# Patient Record
Sex: Male | Born: 1945
Health system: Southern US, Community
[De-identification: ages and names within clinical notes are randomized; demographics above are authoritative.]

## PROBLEM LIST (undated history)

## (undated) DIAGNOSIS — K567 Ileus, unspecified: Secondary | ICD-10-CM

## (undated) DIAGNOSIS — M7660 Achilles tendinitis, unspecified leg: Secondary | ICD-10-CM

## (undated) DIAGNOSIS — J45909 Unspecified asthma, uncomplicated: Secondary | ICD-10-CM

## (undated) DIAGNOSIS — K922 Gastrointestinal hemorrhage, unspecified: Secondary | ICD-10-CM

## (undated) DIAGNOSIS — E291 Testicular hypofunction: Secondary | ICD-10-CM

## (undated) DIAGNOSIS — J302 Other seasonal allergic rhinitis: Secondary | ICD-10-CM

## (undated) DIAGNOSIS — M51369 Other intervertebral disc degeneration, lumbar region without mention of lumbar back pain or lower extremity pain: Secondary | ICD-10-CM

## (undated) DIAGNOSIS — G4730 Sleep apnea, unspecified: Secondary | ICD-10-CM

## (undated) DIAGNOSIS — R7303 Prediabetes: Secondary | ICD-10-CM

## (undated) DIAGNOSIS — M5136 Other intervertebral disc degeneration, lumbar region: Secondary | ICD-10-CM

## (undated) DIAGNOSIS — M7550 Bursitis of unspecified shoulder: Secondary | ICD-10-CM

## (undated) DIAGNOSIS — N529 Male erectile dysfunction, unspecified: Secondary | ICD-10-CM

## (undated) DIAGNOSIS — I10 Essential (primary) hypertension: Secondary | ICD-10-CM

## (undated) DIAGNOSIS — M109 Gout, unspecified: Secondary | ICD-10-CM

## (undated) HISTORY — DX: Gastrointestinal hemorrhage, unspecified: K92.2

## (undated) HISTORY — DX: Essential (primary) hypertension: I10

## (undated) HISTORY — PX: TONSILLECTOMY: SUR1361

## (undated) HISTORY — DX: Gout, unspecified: M10.9

## (undated) HISTORY — DX: Testicular hypofunction: E29.1

## (undated) HISTORY — DX: Ileus, unspecified: K56.7

## (undated) HISTORY — PX: EYE SURGERY: SHX253

## (undated) HISTORY — PX: ADENOIDECTOMY: SUR15

## (undated) HISTORY — PX: LUMBAR EPIDURAL INJECTION: SHX1980

## (undated) HISTORY — DX: Unspecified asthma, uncomplicated: J45.909

## (undated) HISTORY — DX: Morbid (severe) obesity due to excess calories: E66.01

## (undated) HISTORY — PX: RHINOPLASTY: SUR1284

---

## 2011-03-10 DIAGNOSIS — I10 Essential (primary) hypertension: Secondary | ICD-10-CM | POA: Insufficient documentation

## 2011-03-10 DIAGNOSIS — E1169 Type 2 diabetes mellitus with other specified complication: Secondary | ICD-10-CM | POA: Insufficient documentation

## 2011-03-10 DIAGNOSIS — R7303 Prediabetes: Secondary | ICD-10-CM | POA: Insufficient documentation

## 2011-04-02 DIAGNOSIS — G8929 Other chronic pain: Secondary | ICD-10-CM | POA: Insufficient documentation

## 2011-04-02 DIAGNOSIS — M1990 Unspecified osteoarthritis, unspecified site: Secondary | ICD-10-CM | POA: Insufficient documentation

## 2011-04-02 DIAGNOSIS — J302 Other seasonal allergic rhinitis: Secondary | ICD-10-CM | POA: Insufficient documentation

## 2011-04-02 DIAGNOSIS — E66811 Obesity, class 1: Secondary | ICD-10-CM | POA: Insufficient documentation

## 2011-04-02 DIAGNOSIS — M1A00X0 Idiopathic chronic gout, unspecified site, without tophus (tophi): Secondary | ICD-10-CM | POA: Insufficient documentation

## 2011-10-20 DIAGNOSIS — M7540 Impingement syndrome of unspecified shoulder: Secondary | ICD-10-CM | POA: Insufficient documentation

## 2012-03-15 DIAGNOSIS — M75122 Complete rotator cuff tear or rupture of left shoulder, not specified as traumatic: Secondary | ICD-10-CM | POA: Insufficient documentation

## 2012-10-25 HISTORY — PX: COLONOSCOPY: SHX174

## 2013-01-19 DIAGNOSIS — M189 Osteoarthritis of first carpometacarpal joint, unspecified: Secondary | ICD-10-CM | POA: Insufficient documentation

## 2013-01-19 DIAGNOSIS — M6530 Trigger finger, unspecified finger: Secondary | ICD-10-CM | POA: Insufficient documentation

## 2013-07-27 HISTORY — PX: CARPAL TUNNEL RELEASE: SHX101

## 2013-07-27 HISTORY — PX: TRIGGER FINGER RELEASE: SHX641

## 2013-08-17 ENCOUNTER — Ambulatory Visit: Payer: Self-pay | Admitting: Family Medicine

## 2013-10-02 ENCOUNTER — Encounter: Payer: Self-pay | Admitting: Rheumatology

## 2013-10-25 ENCOUNTER — Encounter: Payer: Self-pay | Admitting: Rheumatology

## 2014-03-14 ENCOUNTER — Encounter: Payer: Self-pay | Admitting: Orthopedic Surgery

## 2014-03-27 ENCOUNTER — Encounter: Payer: Self-pay | Admitting: Orthopedic Surgery

## 2014-04-26 ENCOUNTER — Encounter: Payer: Self-pay | Admitting: Orthopedic Surgery

## 2014-07-30 DIAGNOSIS — M5414 Radiculopathy, thoracic region: Secondary | ICD-10-CM | POA: Diagnosis not present

## 2014-07-30 DIAGNOSIS — M9902 Segmental and somatic dysfunction of thoracic region: Secondary | ICD-10-CM | POA: Diagnosis not present

## 2014-07-31 DIAGNOSIS — M9902 Segmental and somatic dysfunction of thoracic region: Secondary | ICD-10-CM | POA: Diagnosis not present

## 2014-07-31 DIAGNOSIS — M5414 Radiculopathy, thoracic region: Secondary | ICD-10-CM | POA: Diagnosis not present

## 2014-08-02 DIAGNOSIS — M5414 Radiculopathy, thoracic region: Secondary | ICD-10-CM | POA: Diagnosis not present

## 2014-08-02 DIAGNOSIS — M9902 Segmental and somatic dysfunction of thoracic region: Secondary | ICD-10-CM | POA: Diagnosis not present

## 2014-08-06 DIAGNOSIS — M9902 Segmental and somatic dysfunction of thoracic region: Secondary | ICD-10-CM | POA: Diagnosis not present

## 2014-08-06 DIAGNOSIS — M5414 Radiculopathy, thoracic region: Secondary | ICD-10-CM | POA: Diagnosis not present

## 2014-08-07 DIAGNOSIS — M9902 Segmental and somatic dysfunction of thoracic region: Secondary | ICD-10-CM | POA: Diagnosis not present

## 2014-08-07 DIAGNOSIS — M5414 Radiculopathy, thoracic region: Secondary | ICD-10-CM | POA: Diagnosis not present

## 2014-08-09 DIAGNOSIS — M9902 Segmental and somatic dysfunction of thoracic region: Secondary | ICD-10-CM | POA: Diagnosis not present

## 2014-08-09 DIAGNOSIS — M5414 Radiculopathy, thoracic region: Secondary | ICD-10-CM | POA: Diagnosis not present

## 2014-08-16 DIAGNOSIS — M5414 Radiculopathy, thoracic region: Secondary | ICD-10-CM | POA: Diagnosis not present

## 2014-08-16 DIAGNOSIS — M9902 Segmental and somatic dysfunction of thoracic region: Secondary | ICD-10-CM | POA: Diagnosis not present

## 2014-08-23 DIAGNOSIS — Z23 Encounter for immunization: Secondary | ICD-10-CM | POA: Diagnosis not present

## 2014-08-23 DIAGNOSIS — I10 Essential (primary) hypertension: Secondary | ICD-10-CM | POA: Diagnosis not present

## 2014-08-23 DIAGNOSIS — Z125 Encounter for screening for malignant neoplasm of prostate: Secondary | ICD-10-CM | POA: Diagnosis not present

## 2014-08-23 DIAGNOSIS — E785 Hyperlipidemia, unspecified: Secondary | ICD-10-CM | POA: Diagnosis not present

## 2014-08-23 DIAGNOSIS — R52 Pain, unspecified: Secondary | ICD-10-CM | POA: Diagnosis not present

## 2014-08-23 DIAGNOSIS — E119 Type 2 diabetes mellitus without complications: Secondary | ICD-10-CM | POA: Diagnosis not present

## 2014-08-23 DIAGNOSIS — Z0000 Encounter for general adult medical examination without abnormal findings: Secondary | ICD-10-CM | POA: Diagnosis not present

## 2014-08-23 DIAGNOSIS — E291 Testicular hypofunction: Secondary | ICD-10-CM | POA: Diagnosis not present

## 2014-08-23 DIAGNOSIS — M109 Gout, unspecified: Secondary | ICD-10-CM | POA: Diagnosis not present

## 2014-08-30 DIAGNOSIS — M9902 Segmental and somatic dysfunction of thoracic region: Secondary | ICD-10-CM | POA: Diagnosis not present

## 2014-08-30 DIAGNOSIS — M5414 Radiculopathy, thoracic region: Secondary | ICD-10-CM | POA: Diagnosis not present

## 2014-10-03 DIAGNOSIS — M5413 Radiculopathy, cervicothoracic region: Secondary | ICD-10-CM | POA: Diagnosis not present

## 2014-10-03 DIAGNOSIS — M5414 Radiculopathy, thoracic region: Secondary | ICD-10-CM | POA: Diagnosis not present

## 2014-10-03 DIAGNOSIS — M9904 Segmental and somatic dysfunction of sacral region: Secondary | ICD-10-CM | POA: Diagnosis not present

## 2014-10-03 DIAGNOSIS — M9902 Segmental and somatic dysfunction of thoracic region: Secondary | ICD-10-CM | POA: Diagnosis not present

## 2014-10-03 DIAGNOSIS — M9901 Segmental and somatic dysfunction of cervical region: Secondary | ICD-10-CM | POA: Diagnosis not present

## 2014-11-19 DIAGNOSIS — E119 Type 2 diabetes mellitus without complications: Secondary | ICD-10-CM | POA: Diagnosis not present

## 2014-11-19 DIAGNOSIS — I10 Essential (primary) hypertension: Secondary | ICD-10-CM | POA: Diagnosis not present

## 2014-11-19 DIAGNOSIS — E785 Hyperlipidemia, unspecified: Secondary | ICD-10-CM | POA: Diagnosis not present

## 2014-11-19 DIAGNOSIS — R748 Abnormal levels of other serum enzymes: Secondary | ICD-10-CM | POA: Diagnosis not present

## 2014-12-10 DIAGNOSIS — M9902 Segmental and somatic dysfunction of thoracic region: Secondary | ICD-10-CM | POA: Diagnosis not present

## 2014-12-10 DIAGNOSIS — M5414 Radiculopathy, thoracic region: Secondary | ICD-10-CM | POA: Diagnosis not present

## 2014-12-11 DIAGNOSIS — M5414 Radiculopathy, thoracic region: Secondary | ICD-10-CM | POA: Diagnosis not present

## 2014-12-11 DIAGNOSIS — M9902 Segmental and somatic dysfunction of thoracic region: Secondary | ICD-10-CM | POA: Diagnosis not present

## 2015-02-11 DIAGNOSIS — M545 Low back pain: Secondary | ICD-10-CM | POA: Diagnosis not present

## 2015-02-11 DIAGNOSIS — M9902 Segmental and somatic dysfunction of thoracic region: Secondary | ICD-10-CM | POA: Diagnosis not present

## 2015-02-11 DIAGNOSIS — M9903 Segmental and somatic dysfunction of lumbar region: Secondary | ICD-10-CM | POA: Diagnosis not present

## 2015-02-11 DIAGNOSIS — M5414 Radiculopathy, thoracic region: Secondary | ICD-10-CM | POA: Diagnosis not present

## 2015-03-14 DIAGNOSIS — H2513 Age-related nuclear cataract, bilateral: Secondary | ICD-10-CM | POA: Diagnosis not present

## 2015-03-14 DIAGNOSIS — E119 Type 2 diabetes mellitus without complications: Secondary | ICD-10-CM | POA: Diagnosis not present

## 2015-03-14 DIAGNOSIS — H43393 Other vitreous opacities, bilateral: Secondary | ICD-10-CM | POA: Diagnosis not present

## 2015-03-14 DIAGNOSIS — H31091 Other chorioretinal scars, right eye: Secondary | ICD-10-CM | POA: Diagnosis not present

## 2015-03-25 DIAGNOSIS — N138 Other obstructive and reflux uropathy: Secondary | ICD-10-CM | POA: Insufficient documentation

## 2015-03-25 DIAGNOSIS — I10 Essential (primary) hypertension: Secondary | ICD-10-CM | POA: Diagnosis not present

## 2015-03-25 DIAGNOSIS — Z87891 Personal history of nicotine dependence: Secondary | ICD-10-CM | POA: Diagnosis not present

## 2015-03-25 DIAGNOSIS — M19042 Primary osteoarthritis, left hand: Secondary | ICD-10-CM | POA: Diagnosis not present

## 2015-03-25 DIAGNOSIS — M545 Low back pain: Secondary | ICD-10-CM | POA: Diagnosis not present

## 2015-03-25 DIAGNOSIS — Z885 Allergy status to narcotic agent status: Secondary | ICD-10-CM | POA: Diagnosis not present

## 2015-03-25 DIAGNOSIS — N529 Male erectile dysfunction, unspecified: Secondary | ICD-10-CM | POA: Diagnosis not present

## 2015-03-25 DIAGNOSIS — E291 Testicular hypofunction: Secondary | ICD-10-CM | POA: Diagnosis not present

## 2015-03-25 DIAGNOSIS — Z79899 Other long term (current) drug therapy: Secondary | ICD-10-CM | POA: Diagnosis not present

## 2015-03-25 DIAGNOSIS — N401 Enlarged prostate with lower urinary tract symptoms: Secondary | ICD-10-CM | POA: Diagnosis not present

## 2015-03-25 DIAGNOSIS — M19041 Primary osteoarthritis, right hand: Secondary | ICD-10-CM | POA: Diagnosis not present

## 2015-03-25 DIAGNOSIS — E119 Type 2 diabetes mellitus without complications: Secondary | ICD-10-CM | POA: Diagnosis not present

## 2015-04-16 ENCOUNTER — Other Ambulatory Visit: Payer: Self-pay | Admitting: Family Medicine

## 2015-04-17 NOTE — Telephone Encounter (Signed)
Your patient 

## 2015-04-29 DIAGNOSIS — M461 Sacroiliitis, not elsewhere classified: Secondary | ICD-10-CM | POA: Diagnosis not present

## 2015-04-29 DIAGNOSIS — M9904 Segmental and somatic dysfunction of sacral region: Secondary | ICD-10-CM | POA: Diagnosis not present

## 2015-04-30 DIAGNOSIS — M461 Sacroiliitis, not elsewhere classified: Secondary | ICD-10-CM | POA: Diagnosis not present

## 2015-04-30 DIAGNOSIS — M9904 Segmental and somatic dysfunction of sacral region: Secondary | ICD-10-CM | POA: Diagnosis not present

## 2015-05-09 DIAGNOSIS — M9904 Segmental and somatic dysfunction of sacral region: Secondary | ICD-10-CM | POA: Diagnosis not present

## 2015-05-09 DIAGNOSIS — M461 Sacroiliitis, not elsewhere classified: Secondary | ICD-10-CM | POA: Diagnosis not present

## 2015-05-13 DIAGNOSIS — M461 Sacroiliitis, not elsewhere classified: Secondary | ICD-10-CM | POA: Diagnosis not present

## 2015-05-13 DIAGNOSIS — M9904 Segmental and somatic dysfunction of sacral region: Secondary | ICD-10-CM | POA: Diagnosis not present

## 2015-05-16 DIAGNOSIS — M461 Sacroiliitis, not elsewhere classified: Secondary | ICD-10-CM | POA: Diagnosis not present

## 2015-05-16 DIAGNOSIS — M9904 Segmental and somatic dysfunction of sacral region: Secondary | ICD-10-CM | POA: Diagnosis not present

## 2015-05-29 DIAGNOSIS — M545 Low back pain: Secondary | ICD-10-CM | POA: Diagnosis not present

## 2015-05-29 DIAGNOSIS — Q789 Osteochondrodysplasia, unspecified: Secondary | ICD-10-CM | POA: Diagnosis not present

## 2015-05-29 DIAGNOSIS — M5137 Other intervertebral disc degeneration, lumbosacral region: Secondary | ICD-10-CM | POA: Diagnosis not present

## 2015-05-29 DIAGNOSIS — M533 Sacrococcygeal disorders, not elsewhere classified: Secondary | ICD-10-CM | POA: Diagnosis not present

## 2015-05-29 DIAGNOSIS — M488X6 Other specified spondylopathies, lumbar region: Secondary | ICD-10-CM | POA: Diagnosis not present

## 2015-06-06 DIAGNOSIS — M461 Sacroiliitis, not elsewhere classified: Secondary | ICD-10-CM | POA: Diagnosis not present

## 2015-06-06 DIAGNOSIS — M9904 Segmental and somatic dysfunction of sacral region: Secondary | ICD-10-CM | POA: Diagnosis not present

## 2015-07-15 DIAGNOSIS — M5136 Other intervertebral disc degeneration, lumbar region: Secondary | ICD-10-CM | POA: Diagnosis not present

## 2015-07-15 DIAGNOSIS — M461 Sacroiliitis, not elsewhere classified: Secondary | ICD-10-CM | POA: Diagnosis not present

## 2015-07-15 DIAGNOSIS — M4306 Spondylolysis, lumbar region: Secondary | ICD-10-CM | POA: Diagnosis not present

## 2015-07-15 DIAGNOSIS — M5416 Radiculopathy, lumbar region: Secondary | ICD-10-CM | POA: Diagnosis not present

## 2015-07-18 DIAGNOSIS — M461 Sacroiliitis, not elsewhere classified: Secondary | ICD-10-CM | POA: Diagnosis not present

## 2015-07-18 DIAGNOSIS — M545 Low back pain: Secondary | ICD-10-CM | POA: Diagnosis not present

## 2015-07-18 DIAGNOSIS — M9904 Segmental and somatic dysfunction of sacral region: Secondary | ICD-10-CM | POA: Diagnosis not present

## 2015-07-24 DIAGNOSIS — M461 Sacroiliitis, not elsewhere classified: Secondary | ICD-10-CM | POA: Diagnosis not present

## 2015-08-12 ENCOUNTER — Telehealth: Payer: Self-pay | Admitting: Family Medicine

## 2015-08-12 NOTE — Telephone Encounter (Signed)
Pharmacy changed in chart. Routing to provider.

## 2015-08-12 NOTE — Telephone Encounter (Signed)
Pt called stated all RX's need to be sent to [facility] in [place] due to insurance purposes. Please remove [facility] from pharmacy list. Thanks.

## 2015-08-12 NOTE — Telephone Encounter (Signed)
Pt needs refills on all medications. Pharm is [facility] in [place]. Thanks.

## 2015-08-13 ENCOUNTER — Other Ambulatory Visit: Payer: Self-pay | Admitting: Physical Medicine and Rehabilitation

## 2015-08-13 DIAGNOSIS — M5417 Radiculopathy, lumbosacral region: Secondary | ICD-10-CM

## 2015-08-14 ENCOUNTER — Other Ambulatory Visit: Payer: Self-pay | Admitting: Family Medicine

## 2015-08-14 MED ORDER — AMLODIPINE BESYLATE 10 MG PO TABS: 10.0000 mg | ORAL_TABLET | Freq: Every day | ORAL | Status: DC

## 2015-08-14 MED ORDER — LISINOPRIL-HYDROCHLOROTHIAZIDE 20-12.5 MG PO TABS: 1.0000 | ORAL_TABLET | Freq: Every day | ORAL | Status: DC

## 2015-08-14 MED ORDER — DICLOFENAC SODIUM 75 MG PO TBEC: 75.0000 mg | DELAYED_RELEASE_TABLET | Freq: Two times a day (BID) | ORAL | Status: DC | PRN

## 2015-08-14 MED ORDER — ALLOPURINOL 100 MG PO TABS: 100.0000 mg | ORAL_TABLET | Freq: Every day | ORAL | Status: DC

## 2015-08-14 MED ORDER — LEVOCETIRIZINE DIHYDROCHLORIDE 5 MG PO TABS: 5.0000 mg | ORAL_TABLET | Freq: Every evening | ORAL | Status: DC

## 2015-08-14 MED ORDER — FLUTICASONE PROPIONATE 50 MCG/ACT NA SUSP: 2.0000 | Freq: Every day | NASAL | Status: DC

## 2015-08-14 NOTE — Telephone Encounter (Signed)
He has not been seen here since we switched to Epic December 26, 2014 PP reviewed; last regular visit with Dr. Jeananne Rama was April 2016; last routine labs were January 2016 He does not have an upcoming appointment with Dr. Jeananne Rama for follow-up Please ask him to make an appointment and then back to me for refill consideration

## 2015-08-14 NOTE — Telephone Encounter (Signed)
Appt w Dr Jeananne Rama 09/05/15 @ 2:30.

## 2015-08-15 ENCOUNTER — Telehealth: Payer: Self-pay | Admitting: Family Medicine

## 2015-08-15 NOTE — Telephone Encounter (Signed)
I found this Rx had been prescribed in PP, old chart Rx approved

## 2015-08-15 NOTE — Telephone Encounter (Signed)
Pt called stated that medications were called in to [facility] and the quantities were incorrect he would like this corrected ASAP.  Pt stated all medications should have been sent for 90 day supplies not a 30 day supply.  Pt stated the RX for Metaxalone was not sent at all.  [facility] in [place]. Thanks.

## 2015-08-16 ENCOUNTER — Other Ambulatory Visit: Payer: Self-pay | Admitting: Family Medicine

## 2015-08-16 NOTE — Telephone Encounter (Signed)
Spoke with patient, he is very mad that a 90 day supply was not sent in, he states that he might just have to change offices. He states that there has been no cooperation here with [place] and the way we are doing things now make no sense.

## 2015-08-16 NOTE — Telephone Encounter (Signed)
I sent 30 days on purpose; see last phone note The metaxalone came in as a separate request and I sent it when I received it I left detailed msg; explained I approved 30 days; he'll most likely get labs and I'm sure Dr. Jeananne Rama will approve 90 days when he sees him in Feb

## 2015-08-16 NOTE — Telephone Encounter (Signed)
Routing to provider  

## 2015-08-16 NOTE — Telephone Encounter (Signed)
I approved 30 days; needs appt and labs; I am not comfortable approving a 3 month supply; he has appt with Dr. Jeananne Rama in a few weeks

## 2015-08-22 DIAGNOSIS — M5416 Radiculopathy, lumbar region: Secondary | ICD-10-CM | POA: Diagnosis not present

## 2015-08-22 DIAGNOSIS — M5136 Other intervertebral disc degeneration, lumbar region: Secondary | ICD-10-CM | POA: Diagnosis not present

## 2015-09-02 ENCOUNTER — Ambulatory Visit
Admission: RE | Admit: 2015-09-02 | Discharge: 2015-09-02 | Disposition: A | Discharge status: Home / Self Care | Payer: PPO | Source: Ambulatory Visit | Attending: Physical Medicine and Rehabilitation | Admitting: Physical Medicine and Rehabilitation

## 2015-09-02 DIAGNOSIS — M4690 Unspecified inflammatory spondylopathy, site unspecified: Secondary | ICD-10-CM | POA: Diagnosis not present

## 2015-09-02 DIAGNOSIS — M5186 Other intervertebral disc disorders, lumbar region: Secondary | ICD-10-CM | POA: Diagnosis not present

## 2015-09-02 DIAGNOSIS — M5417 Radiculopathy, lumbosacral region: Secondary | ICD-10-CM

## 2015-09-02 DIAGNOSIS — M5126 Other intervertebral disc displacement, lumbar region: Secondary | ICD-10-CM | POA: Diagnosis not present

## 2015-09-02 DIAGNOSIS — M4806 Spinal stenosis, lumbar region: Secondary | ICD-10-CM | POA: Diagnosis not present

## 2015-09-02 DIAGNOSIS — M5416 Radiculopathy, lumbar region: Secondary | ICD-10-CM | POA: Diagnosis not present

## 2015-09-05 ENCOUNTER — Ambulatory Visit: Payer: Self-pay | Admitting: Family Medicine

## 2015-09-17 DIAGNOSIS — M1A0720 Idiopathic chronic gout, left ankle and foot, without tophus (tophi): Secondary | ICD-10-CM | POA: Diagnosis not present

## 2015-09-17 DIAGNOSIS — E785 Hyperlipidemia, unspecified: Secondary | ICD-10-CM | POA: Diagnosis not present

## 2015-09-17 DIAGNOSIS — R7303 Prediabetes: Secondary | ICD-10-CM | POA: Diagnosis not present

## 2015-09-17 DIAGNOSIS — E1169 Type 2 diabetes mellitus with other specified complication: Secondary | ICD-10-CM | POA: Insufficient documentation

## 2015-09-17 DIAGNOSIS — E291 Testicular hypofunction: Secondary | ICD-10-CM | POA: Diagnosis not present

## 2015-09-17 DIAGNOSIS — M7660 Achilles tendinitis, unspecified leg: Secondary | ICD-10-CM | POA: Diagnosis not present

## 2015-09-17 DIAGNOSIS — N529 Male erectile dysfunction, unspecified: Secondary | ICD-10-CM | POA: Diagnosis not present

## 2015-09-17 DIAGNOSIS — I10 Essential (primary) hypertension: Secondary | ICD-10-CM | POA: Diagnosis not present

## 2015-09-18 DIAGNOSIS — E785 Hyperlipidemia, unspecified: Secondary | ICD-10-CM | POA: Diagnosis not present

## 2015-09-18 DIAGNOSIS — M1A0720 Idiopathic chronic gout, left ankle and foot, without tophus (tophi): Secondary | ICD-10-CM | POA: Diagnosis not present

## 2015-09-18 DIAGNOSIS — R7303 Prediabetes: Secondary | ICD-10-CM | POA: Diagnosis not present

## 2015-09-18 DIAGNOSIS — I10 Essential (primary) hypertension: Secondary | ICD-10-CM | POA: Diagnosis not present

## 2015-09-18 DIAGNOSIS — M7551 Bursitis of right shoulder: Secondary | ICD-10-CM | POA: Diagnosis not present

## 2015-09-24 DIAGNOSIS — G8929 Other chronic pain: Secondary | ICD-10-CM | POA: Diagnosis not present

## 2015-09-24 DIAGNOSIS — M25512 Pain in left shoulder: Secondary | ICD-10-CM | POA: Diagnosis not present

## 2015-09-27 ENCOUNTER — Ambulatory Visit: Payer: Self-pay | Admitting: Family Medicine

## 2015-10-04 ENCOUNTER — Other Ambulatory Visit: Payer: Self-pay

## 2015-10-04 ENCOUNTER — Encounter: Payer: Self-pay | Admitting: Family Medicine

## 2015-10-04 ENCOUNTER — Ambulatory Visit (INDEPENDENT_AMBULATORY_CARE_PROVIDER_SITE_OTHER): Payer: PPO | Admitting: Family Medicine

## 2015-10-04 VITALS — BP 128/84 | HR 77 | Ht 71.0 in | Wt 283.0 lb

## 2015-10-04 DIAGNOSIS — S86002A Unspecified injury of left Achilles tendon, initial encounter: Secondary | ICD-10-CM | POA: Diagnosis not present

## 2015-10-04 DIAGNOSIS — M7662 Achilles tendinitis, left leg: Secondary | ICD-10-CM | POA: Diagnosis not present

## 2015-10-04 NOTE — Progress Notes (Signed)
[facility] [address] [place], [place] [address] Phone: [phone] Subjective:    I'm seeing this patient by the request  of:  [name]   CC: Left Achilles pain  [id number] [facility] is a 70 y.o. male coming in with complaint of left Achilles pain. Patient states his been going on many weeks if not months. Does not remember any true injury. Patient states that it is very tender to palpation. Has not notice any significant improvement with any of the changes in shoes. Patient has tried icing 1 time which was helpful. Seems to fluctuate in size. Denies any fevers or chills. No breakdown of the skin he states. Rates the severity of pain though is 8 out of 10. Patient recently did have a back injury and has not been working out secondary to that as well as now since he's had this pain in his ankle. Patient has gained 40 pounds he states over the last couple months.     Past Medical History  Diagnosis Date  . Male hypogonadism   . Hypertension   . Gout   . Chronic back pain     L4 deformity   Past Surgical History  Procedure Laterality Date  . Trigger finger release Right   . Carpal tunnel release Right 2015  . Tonsillectomy    . Rhinoplasty     Social History   Social History  . Marital Status: Married    Spouse Name: N/A  . Number of Children: N/A  . Years of Education: N/A   Social History Main Topics  . Smoking status: Former Smoker    Quit date: 07/27/1978  . Smokeless tobacco: None  . Alcohol Use: Yes  . Drug Use: No  . Sexual Activity: Not Asked   Other Topics Concern  . None   Social History Narrative   Allergies  Allergen Reactions  . Codeine Other (See Comments)    Other reaction(s): Other (See Comments) HEADACHES Causes headaches when he stops liquid Codeine-based products   Family History  Problem Relation Age of Onset  . Alcohol abuse Father   . Hypertension Father   . CVA Father 44    Past  medical history, social, surgical and family history all reviewed in electronic medical record.  No pertanent information unless stated regarding to the chief complaint.   Review of Systems: No headache, visual changes, nausea, vomiting, diarrhea, constipation, dizziness, abdominal pain, skin rash, fevers, chills, night sweats, weight loss, swollen lymph nodes, body aches, joint swelling, muscle aches, chest pain, shortness of breath, mood changes.   Objective Blood pressure 128/84, pulse 77, height 5\' 11"  (1.803 m), weight 283 lb (128.368 kg), SpO2 97 %.  General: No apparent distress alert and oriented x3 mood and affect normal, dressed appropriately. Severe obesity HEENT: Pupils equal, extraocular movements intact  Respiratory: Patient's speak in full sentences and does not appear short of breath  Cardiovascular: No lower extremity edema, non tender, no erythema  Skin: Warm dry intact with no signs of infection or rash on extremities or on axial skeleton.  Abdomen: Soft nontender  Neuro: Cranial nerves II through XII are intact, neurovascularly intact in all extremities with 2+ DTRs and 2+ pulses.  Lymph: No lymphadenopathy of posterior or anterior cervical chain or axillae bilaterally.  Gait antalgic gait MSK:  Non tender with full range of motion and good stability and symmetric strength and tone of shoulders, elbows, wrist, hip, knee and bilaterally.  Ankle: Left  Patient does have an Achilles nodule approximately 3 cm proximal to insertion. Range of motion is full in all directions. Strength is 5/5 in all directions. Stable lateral and medial ligaments; squeeze test and kleiger test unremarkable; Talar dome nontender; No pain at base of 5th MT; No tenderness over cuboid; No tenderness over N spot or navicular prominence No tenderness on posterior aspects of lateral and medial malleolus No sign of peroneal tendon subluxations or tenderness to palpation Negative tarsal tunnel  tinel's Achilles is significant tender to palpation over the nodule. No significant pain at the insertion. Negative Thompson test. Neurovascular intact distally. Able to walk 4 steps. Contralateral ankle unremarkable  MSK US performed of: Left ankle This study was ordered, performed, and interpreted by Charlann Boxer D.O.  Foot/Ankle:   All structures visualized and important images stored. Talar dome unremarkable  Ankle mortise without effusion. Achilles tendon visualized along length of tendon and insertion has very minimal hypoechoic changes. Approximately 3 cm proximal to this area patient does have a large thickening of the Achilles tendon as well as inflammation and hypoechoic changes. Severe increase in Doppler flow within the tendon itself. Anterior Talofibular Ligament and Calcaneofibular Ligaments unremarkable and intact. Deltoid Ligament unremarkable and intact. Plantar fascia intact and without effusion, normal thickness. No increased doppler signal, cap sign, or thickening of tibial cortex. Power doppler signal normal. Images stored in machine hard drive  IMPRESSION:  Severe Achilles tendinitis with increasing Doppler flow within the tendon that corresponds with partial tearing.       Impression and Recommendations:     This case required medical decision making of moderate complexity.      Note: This dictation was prepared with Dragon dictation along with smaller phrase technology. Any transcriptional errors that result from this process are unintentional.

## 2015-10-04 NOTE — Patient Instructions (Signed)
Good to see you.  Ice 20 minutes 2 times daily. Usually after activity and before bed. pennsaid pinkie amount topically 2 times daily as needed.  Vitamin D 2000 IU daily over the counter can help with healing.  Heel lift in left shoe at all times Wear brace with walking.  Avoid high impact exercises  See me again in 2-3 weeks and if better we will get you doing some exercises.  OK to bike for now.

## 2015-10-04 NOTE — Assessment & Plan Note (Signed)
Patient does have what seems to be severe Achilles tendinitis with partial tearing noted. We discussed icing regimen, home exercises, patient was given a brace. Patient will do a heel lift. We discussed which activities to avoid. We did discuss the possibility of a Cam Walker but secondary to patient's most recent back injury I think that the asymmetry of walking could cause exacerbation of the back. Patient and will come back in 2-3 weeks. As long as we seen decrease inflammation as well as the Doppler flow on ultrasound we will start him with home exercises and questionable formal physical therapy.

## 2015-10-04 NOTE — Progress Notes (Signed)
Pre visit review using our clinic review tool, if applicable. No additional management support is needed unless otherwise documented below in the visit note. 

## 2015-10-07 ENCOUNTER — Ambulatory Visit: Payer: Self-pay | Admitting: Family Medicine

## 2015-10-16 DIAGNOSIS — M5416 Radiculopathy, lumbar region: Secondary | ICD-10-CM | POA: Diagnosis not present

## 2015-10-24 ENCOUNTER — Encounter: Payer: Self-pay | Admitting: Family Medicine

## 2015-10-24 ENCOUNTER — Ambulatory Visit (INDEPENDENT_AMBULATORY_CARE_PROVIDER_SITE_OTHER): Payer: PPO | Admitting: Family Medicine

## 2015-10-24 VITALS — BP 128/76 | HR 74 | Wt 283.0 lb

## 2015-10-24 DIAGNOSIS — M7662 Achilles tendinitis, left leg: Secondary | ICD-10-CM | POA: Diagnosis not present

## 2015-10-24 DIAGNOSIS — M1A00X0 Idiopathic chronic gout, unspecified site, without tophus (tophi): Secondary | ICD-10-CM

## 2015-10-24 MED ORDER — PREDNISONE 50 MG PO TABS: 50.0000 mg | ORAL_TABLET | Freq: Every day | ORAL | Status: DC

## 2015-10-24 MED ORDER — ALLOPURINOL 300 MG PO TABS: 300.0000 mg | ORAL_TABLET | Freq: Every day | ORAL | Status: DC

## 2015-10-24 NOTE — Assessment & Plan Note (Signed)
Encourage weight loss. 

## 2015-10-24 NOTE — Progress Notes (Signed)
[facility] [address], [place] [address] Phone: [phone] Subjective:    I'm seeing this patient by the request  of:  [name]   CC: Left Achilles pain f/u  [id number] [facility] is a 70 y.o. male coming in with complaint of left Achilles pain. Found to have partial tearing at this time. Patient was to wear a heel lift which she never got. Has been wearing a brace with no significant improvement. Occasionally tries a topical and oral anti-inflammatories with no improvement. Doing the exercises sometimes which does help but doesn't do it regular enough. Patient continues to have a significant amount soreness that does keep him from doing things. Sometimes even the tightness at night. Recently has had more trouble with his gout.     Past Medical History  Diagnosis Date  . Male hypogonadism   . Hypertension   . Gout   . Chronic back pain     L4 deformity   Past Surgical History  Procedure Laterality Date  . Trigger finger release Right   . Carpal tunnel release Right 2015  . Tonsillectomy    . Rhinoplasty     Social History   Social History  . Marital Status: Married    Spouse Name: N/A  . Number of Children: N/A  . Years of Education: N/A   Social History Main Topics  . Smoking status: Former Smoker    Quit date: 07/27/1978  . Smokeless tobacco: Not on file  . Alcohol Use: Yes  . Drug Use: No  . Sexual Activity: Not on file   Other Topics Concern  . Not on file   Social History Narrative   Allergies  Allergen Reactions  . Codeine Other (See Comments)    Other reaction(s): Other (See Comments) HEADACHES Causes headaches when he stops liquid Codeine-based products   Family History  Problem Relation Age of Onset  . Alcohol abuse Father   . Hypertension Father   . CVA Father 32    Past medical history, social, surgical and family history all reviewed in electronic medical record.  No pertanent  information unless stated regarding to the chief complaint.   Review of Systems: No headache, visual changes, nausea, vomiting, diarrhea, constipation, dizziness, abdominal pain, skin rash, fevers, chills, night sweats, weight loss, swollen lymph nodes, body aches, joint swelling, muscle aches, chest pain, shortness of breath, mood changes.   Objective There were no vitals taken for this visit.  General: No apparent distress alert and oriented x3 mood and affect normal, dressed appropriately. Severe obesity HEENT: Pupils equal, extraocular movements intact  Respiratory: Patient's speak in full sentences and does not appear short of breath  Cardiovascular: No lower extremity edema, non tender, no erythema  Skin: Warm dry intact with no signs of infection or rash on extremities or on axial skeleton.  Abdomen: Soft nontender  Neuro: Cranial nerves II through XII are intact, neurovascularly intact in all extremities with 2+ DTRs and 2+ pulses.  Lymph: No lymphadenopathy of posterior or anterior cervical chain or axillae bilaterally.  Gait antalgic gait MSK:  Non tender with full range of motion and good stability and symmetric strength and tone of shoulders, elbows, wrist, hip, knee and bilaterally.  Ankle: Left Patient does have an Achilles nodule approximately 3 cm proximal to insertion. No significant change and continues to be tender Range of motion is full in all directions. Strength is 5/5 in all directions. Stable lateral and medial ligaments; squeeze  test and kleiger test unremarkable; Talar dome nontender; No pain at base of 5th MT; No tenderness over cuboid; No tenderness over N spot or navicular prominence No tenderness on posterior aspects of lateral and medial malleolus No sign of peroneal tendon subluxations or tenderness to palpation Negative tarsal tunnel tinel's Achilles is significant tender to palpation over the nodule. No significant pain at the insertion as well and  possibly worsening.[name] Kitchen Negative Thompson test. Neurovascular intact distally. Able to walk 4 steps. Contralateral ankle unremarkable     Impression and Recommendations:     This case required medical decision making of moderate complexity.      Note: This dictation was prepared with Dragon dictation along with smaller phrase technology. Any transcriptional errors that result from this process are unintentional.

## 2015-10-24 NOTE — Assessment & Plan Note (Signed)
Increased in all to 300 mg. Warned of potential side effects. Patient's kidneys have been working well. Follow-up in 2 weeks.

## 2015-10-24 NOTE — Assessment & Plan Note (Signed)
Patient has not made any significant improvement at this time. Possible worsening. Underlying gout could also be contributing. Patient put in a Cam Walker. Patient and prednisone a day for the next 5 days and warned of potential side effects. Increase patient's allopurinol. Patient will continue with the icing regimen. Discontinue the anti-inflammatory well on the prednisone. Patient and will come back again in 2-3 weeks for further evaluation and treatment. At that time we'll consider ultrasound to make sure patient is improving.  Spent  25 minutes with patient face-to-face and had greater than 50% of counseling including as described above in assessment and plan.

## 2015-10-24 NOTE — Patient Instructions (Addendum)
Good to see you  Wear boot daily for next 2 weeks.  Prednisone daily for 5 days Allopurinol 300mg  daily  Still would order the heel lift because we will need it after the boot.  See me again in 2-3 weeks and we will ultrasound again.

## 2015-11-06 DIAGNOSIS — M5137 Other intervertebral disc degeneration, lumbosacral region: Secondary | ICD-10-CM | POA: Diagnosis not present

## 2015-11-06 DIAGNOSIS — M4806 Spinal stenosis, lumbar region: Secondary | ICD-10-CM | POA: Diagnosis not present

## 2015-11-29 DIAGNOSIS — M5136 Other intervertebral disc degeneration, lumbar region: Secondary | ICD-10-CM | POA: Diagnosis not present

## 2015-11-29 DIAGNOSIS — M5416 Radiculopathy, lumbar region: Secondary | ICD-10-CM | POA: Diagnosis not present

## 2015-12-26 DIAGNOSIS — K922 Gastrointestinal hemorrhage, unspecified: Secondary | ICD-10-CM

## 2015-12-26 DIAGNOSIS — K567 Ileus, unspecified: Secondary | ICD-10-CM

## 2015-12-26 HISTORY — DX: Ileus, unspecified: K56.7

## 2015-12-26 HISTORY — DX: Gastrointestinal hemorrhage, unspecified: K92.2

## 2015-12-28 ENCOUNTER — Inpatient Hospital Stay
Admission: EM | Admit: 2015-12-28 | Discharge: 2015-12-30 | DRG: 683 | Disposition: A | Discharge status: Home / Self Care | Payer: PPO | Attending: Specialist | Admitting: Specialist

## 2015-12-28 ENCOUNTER — Emergency Department: Payer: PPO

## 2015-12-28 ENCOUNTER — Ambulatory Visit (INDEPENDENT_AMBULATORY_CARE_PROVIDER_SITE_OTHER)
Admission: EM | Admit: 2015-12-28 | Discharge: 2015-12-28 | Disposition: A | Discharge status: Other Acute Inpatient Hospital | Payer: PPO | Source: Home / Self Care | Attending: Family Medicine | Admitting: Family Medicine

## 2015-12-28 ENCOUNTER — Ambulatory Visit (INDEPENDENT_AMBULATORY_CARE_PROVIDER_SITE_OTHER): Payer: PPO

## 2015-12-28 ENCOUNTER — Encounter: Payer: Self-pay | Admitting: Gynecology

## 2015-12-28 ENCOUNTER — Inpatient Hospital Stay: Payer: PPO

## 2015-12-28 ENCOUNTER — Encounter: Payer: Self-pay | Admitting: Emergency Medicine

## 2015-12-28 DIAGNOSIS — Z79899 Other long term (current) drug therapy: Secondary | ICD-10-CM | POA: Diagnosis not present

## 2015-12-28 DIAGNOSIS — N179 Acute kidney failure, unspecified: Secondary | ICD-10-CM

## 2015-12-28 DIAGNOSIS — K922 Gastrointestinal hemorrhage, unspecified: Secondary | ICD-10-CM | POA: Diagnosis not present

## 2015-12-28 DIAGNOSIS — K567 Ileus, unspecified: Secondary | ICD-10-CM | POA: Diagnosis not present

## 2015-12-28 DIAGNOSIS — K920 Hematemesis: Secondary | ICD-10-CM

## 2015-12-28 DIAGNOSIS — M109 Gout, unspecified: Secondary | ICD-10-CM | POA: Diagnosis not present

## 2015-12-28 DIAGNOSIS — E785 Hyperlipidemia, unspecified: Secondary | ICD-10-CM | POA: Diagnosis present

## 2015-12-28 DIAGNOSIS — M549 Dorsalgia, unspecified: Secondary | ICD-10-CM | POA: Diagnosis not present

## 2015-12-28 DIAGNOSIS — Z823 Family history of stroke: Secondary | ICD-10-CM | POA: Diagnosis not present

## 2015-12-28 DIAGNOSIS — R112 Nausea with vomiting, unspecified: Secondary | ICD-10-CM

## 2015-12-28 DIAGNOSIS — E871 Hypo-osmolality and hyponatremia: Secondary | ICD-10-CM

## 2015-12-28 DIAGNOSIS — M5136 Other intervertebral disc degeneration, lumbar region: Secondary | ICD-10-CM | POA: Diagnosis not present

## 2015-12-28 DIAGNOSIS — I10 Essential (primary) hypertension: Secondary | ICD-10-CM | POA: Diagnosis present

## 2015-12-28 DIAGNOSIS — K529 Noninfective gastroenteritis and colitis, unspecified: Secondary | ICD-10-CM | POA: Diagnosis not present

## 2015-12-28 DIAGNOSIS — K5660 Unspecified intestinal obstruction: Secondary | ICD-10-CM | POA: Diagnosis not present

## 2015-12-28 DIAGNOSIS — Z8249 Family history of ischemic heart disease and other diseases of the circulatory system: Secondary | ICD-10-CM

## 2015-12-28 DIAGNOSIS — N401 Enlarged prostate with lower urinary tract symptoms: Secondary | ICD-10-CM | POA: Diagnosis present

## 2015-12-28 DIAGNOSIS — Z811 Family history of alcohol abuse and dependence: Secondary | ICD-10-CM

## 2015-12-28 DIAGNOSIS — K56609 Unspecified intestinal obstruction, unspecified as to partial versus complete obstruction: Secondary | ICD-10-CM

## 2015-12-28 DIAGNOSIS — R109 Unspecified abdominal pain: Secondary | ICD-10-CM | POA: Diagnosis not present

## 2015-12-28 DIAGNOSIS — Z87891 Personal history of nicotine dependence: Secondary | ICD-10-CM | POA: Diagnosis not present

## 2015-12-28 DIAGNOSIS — E876 Hypokalemia: Secondary | ICD-10-CM | POA: Diagnosis not present

## 2015-12-28 DIAGNOSIS — Z885 Allergy status to narcotic agent status: Secondary | ICD-10-CM

## 2015-12-28 DIAGNOSIS — R1084 Generalized abdominal pain: Secondary | ICD-10-CM

## 2015-12-28 DIAGNOSIS — J302 Other seasonal allergic rhinitis: Secondary | ICD-10-CM | POA: Diagnosis present

## 2015-12-28 DIAGNOSIS — R1110 Vomiting, unspecified: Secondary | ICD-10-CM | POA: Diagnosis not present

## 2015-12-28 DIAGNOSIS — Z4682 Encounter for fitting and adjustment of non-vascular catheter: Secondary | ICD-10-CM | POA: Diagnosis not present

## 2015-12-28 DIAGNOSIS — E669 Obesity, unspecified: Secondary | ICD-10-CM | POA: Diagnosis not present

## 2015-12-28 DIAGNOSIS — G8929 Other chronic pain: Secondary | ICD-10-CM | POA: Diagnosis present

## 2015-12-28 DIAGNOSIS — N529 Male erectile dysfunction, unspecified: Secondary | ICD-10-CM | POA: Diagnosis not present

## 2015-12-28 DIAGNOSIS — E291 Testicular hypofunction: Secondary | ICD-10-CM | POA: Diagnosis present

## 2015-12-28 DIAGNOSIS — R110 Nausea: Secondary | ICD-10-CM

## 2015-12-28 DIAGNOSIS — K760 Fatty (change of) liver, not elsewhere classified: Secondary | ICD-10-CM | POA: Diagnosis not present

## 2015-12-28 DIAGNOSIS — R7303 Prediabetes: Secondary | ICD-10-CM | POA: Diagnosis not present

## 2015-12-28 HISTORY — DX: Other seasonal allergic rhinitis: J30.2

## 2015-12-28 HISTORY — DX: Male erectile dysfunction, unspecified: N52.9

## 2015-12-28 HISTORY — DX: Prediabetes: R73.03

## 2015-12-28 HISTORY — DX: Other intervertebral disc degeneration, lumbar region: M51.36

## 2015-12-28 HISTORY — DX: Achilles tendinitis, unspecified leg: M76.60

## 2015-12-28 HISTORY — DX: Bursitis of unspecified shoulder: M75.50

## 2015-12-28 HISTORY — DX: Other intervertebral disc degeneration, lumbar region without mention of lumbar back pain or lower extremity pain: M51.369

## 2015-12-28 LAB — COMPREHENSIVE METABOLIC PANEL
ALT: 25 U/L (ref 17–63)
AST: 31 U/L (ref 15–41)
Albumin: 4.8 g/dL (ref 3.5–5.0)
Alkaline Phosphatase: 52 U/L (ref 38–126)
Anion gap: 17 — ABNORMAL HIGH (ref 5–15)
BUN: 35 mg/dL — AB (ref 6–20)
CHLORIDE: 78 mmol/L — AB (ref 101–111)
CO2: 37 mmol/L — AB (ref 22–32)
CREATININE: 2.57 mg/dL — AB (ref 0.61–1.24)
Calcium: 10 mg/dL (ref 8.9–10.3)
GFR calc non Af Amer: 24 mL/min — ABNORMAL LOW (ref >60)
GFR, EST AFRICAN AMERICAN: 28 mL/min — AB (ref >60)
Glucose, Bld: 225 mg/dL — ABNORMAL HIGH (ref 65–99)
POTASSIUM: 3 mmol/L — AB (ref 3.5–5.1)
SODIUM: 132 mmol/L — AB (ref 135–145)
Total Bilirubin: 1.4 mg/dL — ABNORMAL HIGH (ref 0.3–1.2)
Total Protein: 8.2 g/dL — ABNORMAL HIGH (ref 6.5–8.1)

## 2015-12-28 LAB — CBC WITH DIFFERENTIAL/PLATELET
Basophils Absolute: 0 10*3/uL (ref 0–0.1)
Basophils Relative: 0 %
EOS ABS: 0 10*3/uL (ref 0–0.7)
HCT: 54.4 % — ABNORMAL HIGH (ref 40.0–52.0)
Hemoglobin: 18.9 g/dL — ABNORMAL HIGH (ref 13.0–18.0)
Lymphocytes Relative: 13 %
Lymphs Abs: 1.3 10*3/uL (ref 1.0–3.6)
MCH: 30.6 pg (ref 26.0–34.0)
MCHC: 34.7 g/dL (ref 32.0–36.0)
MCV: 88.3 fL (ref 80.0–100.0)
Monocytes Absolute: 1.2 10*3/uL — ABNORMAL HIGH (ref 0.2–1.0)
Monocytes Relative: 12 %
Neutro Abs: 7.4 10*3/uL — ABNORMAL HIGH (ref 1.4–6.5)
Neutrophils Relative %: 75 %
PLATELETS: 211 10*3/uL (ref 150–440)
RBC: 6.16 MIL/uL — AB (ref 4.40–5.90)
RDW: 14 % (ref 11.5–14.5)
WBC: 9.9 10*3/uL (ref 3.8–10.6)

## 2015-12-28 LAB — OCCULT BLOOD X 1 CARD TO LAB, STOOL: Fecal Occult Bld: POSITIVE — AB

## 2015-12-28 MED ORDER — METRONIDAZOLE IN NACL 5-0.79 MG/ML-% IV SOLN
500.0000 mg | Freq: Three times a day (TID) | INTRAVENOUS | Status: DC
  Administered 2015-12-28 – 2015-12-30 (×6): 500 mg via INTRAVENOUS
  Filled 2015-12-28 (×8): qty 100

## 2015-12-28 MED ORDER — ZOLPIDEM TARTRATE 5 MG PO TABS: 10.0000 mg | ORAL_TABLET | Freq: Every evening | ORAL | Status: DC | PRN

## 2015-12-28 MED ORDER — SODIUM CHLORIDE 0.9 % IV BOLUS (SEPSIS)
1000.0000 mL | Freq: Once | INTRAVENOUS | Status: AC
  Administered 2015-12-28: 1000 mL via INTRAVENOUS

## 2015-12-28 MED ORDER — METAXALONE 800 MG PO TABS
800.0000 mg | ORAL_TABLET | Freq: Three times a day (TID) | ORAL | Status: DC | PRN
  Filled 2015-12-28: qty 1

## 2015-12-28 MED ORDER — ADULT MULTIVITAMIN W/MINERALS CH
1.0000 | ORAL_TABLET | Freq: Every day | ORAL | Status: DC
  Administered 2015-12-29 – 2015-12-30 (×3): 1 via ORAL
  Filled 2015-12-28 (×3): qty 1

## 2015-12-28 MED ORDER — ONDANSETRON HCL 4 MG/2ML IJ SOLN
4.0000 mg | Freq: Once | INTRAMUSCULAR | Status: AC
  Administered 2015-12-28: 4 mg via INTRAVENOUS
  Filled 2015-12-28: qty 2

## 2015-12-28 MED ORDER — ONDANSETRON HCL 4 MG PO TABS: 4.0000 mg | ORAL_TABLET | Freq: Four times a day (QID) | ORAL | Status: DC | PRN

## 2015-12-28 MED ORDER — ACETAMINOPHEN 325 MG PO TABS: 650.0000 mg | ORAL_TABLET | Freq: Four times a day (QID) | ORAL | Status: DC | PRN

## 2015-12-28 MED ORDER — DICLOFENAC SODIUM 1 % TD GEL
2.0000 g | Freq: Every day | TRANSDERMAL | Status: DC | PRN
  Filled 2015-12-28: qty 100

## 2015-12-28 MED ORDER — CIPROFLOXACIN IN D5W 400 MG/200ML IV SOLN
400.0000 mg | Freq: Two times a day (BID) | INTRAVENOUS | Status: DC
  Administered 2015-12-28 – 2015-12-30 (×4): 400 mg via INTRAVENOUS
  Filled 2015-12-28 (×5): qty 200

## 2015-12-28 MED ORDER — LORATADINE 10 MG PO TABS
10.0000 mg | ORAL_TABLET | Freq: Every evening | ORAL | Status: DC
  Administered 2015-12-29 (×2): 10 mg via ORAL
  Filled 2015-12-28 (×2): qty 1

## 2015-12-28 MED ORDER — PROMETHAZINE HCL 25 MG/ML IJ SOLN
25.0000 mg | Freq: Once | INTRAMUSCULAR | Status: AC
  Administered 2015-12-28: 25 mg via INTRAMUSCULAR

## 2015-12-28 MED ORDER — SODIUM CHLORIDE 0.9 % IV SOLN
8.0000 mg/h | INTRAVENOUS | Status: DC
  Administered 2015-12-28: 8 mg/h via INTRAVENOUS
  Filled 2015-12-28: qty 80

## 2015-12-28 MED ORDER — ALLOPURINOL 300 MG PO TABS
300.0000 mg | ORAL_TABLET | Freq: Every day | ORAL | Status: DC
  Administered 2015-12-29 – 2015-12-30 (×2): 300 mg via ORAL
  Filled 2015-12-28 (×2): qty 1

## 2015-12-28 MED ORDER — ACETAMINOPHEN 650 MG RE SUPP: 650.0000 mg | Freq: Four times a day (QID) | RECTAL | Status: DC | PRN

## 2015-12-28 MED ORDER — FLUTICASONE PROPIONATE 50 MCG/ACT NA SUSP
2.0000 | Freq: Every day | NASAL | Status: DC
  Administered 2015-12-30: 2 via NASAL
  Filled 2015-12-28: qty 16

## 2015-12-28 MED ORDER — ENOXAPARIN SODIUM 40 MG/0.4ML ~~LOC~~ SOLN
40.0000 mg | SUBCUTANEOUS | Status: DC
  Administered 2015-12-28 – 2015-12-29 (×2): 40 mg via SUBCUTANEOUS
  Filled 2015-12-28 (×2): qty 0.4

## 2015-12-28 MED ORDER — POTASSIUM CHLORIDE 10 MEQ/100ML IV SOLN
10.0000 meq | INTRAVENOUS | Status: AC
Start: 2015-12-28 — End: 2015-12-29
  Administered 2015-12-28 – 2015-12-29 (×4): 10 meq via INTRAVENOUS
  Filled 2015-12-28 (×7): qty 100

## 2015-12-28 MED ORDER — PANTOPRAZOLE SODIUM 40 MG IV SOLR: 40.0000 mg | Freq: Two times a day (BID) | INTRAVENOUS | Status: DC

## 2015-12-28 MED ORDER — SODIUM CHLORIDE 0.9 % IV SOLN
INTRAVENOUS | Status: DC
  Administered 2015-12-29: via INTRAVENOUS

## 2015-12-28 MED ORDER — LEVOCETIRIZINE DIHYDROCHLORIDE 5 MG PO TABS: 5.0000 mg | ORAL_TABLET | Freq: Every evening | ORAL | Status: DC

## 2015-12-28 MED ORDER — ONDANSETRON HCL 4 MG/2ML IJ SOLN: 4.0000 mg | Freq: Four times a day (QID) | INTRAMUSCULAR | Status: DC | PRN

## 2015-12-28 MED ORDER — DIATRIZOATE MEGLUMINE & SODIUM 66-10 % PO SOLN
15.0000 mL | ORAL | Status: AC
  Administered 2015-12-28: 15 mL via ORAL

## 2015-12-28 MED ORDER — SODIUM CHLORIDE 0.9 % IV SOLN
80.0000 mg | Freq: Once | INTRAVENOUS | Status: AC
  Administered 2015-12-28: 80 mg via INTRAVENOUS
  Filled 2015-12-28: qty 80

## 2015-12-28 NOTE — ED Notes (Signed)
Pt reports he sneezed and NG tube came out. NG tube replaced. X-ray ordered to verify placement.

## 2015-12-28 NOTE — Discharge Instructions (Signed)
Abdominal Pain, Adult Many things can cause abdominal pain. Usually, abdominal pain is not caused by a disease and will improve without treatment. It can often be observed and treated at home. Your health care provider will do a physical exam and possibly order blood tests and X-rays to help determine the seriousness of your pain. However, in many cases, more time must pass before a clear cause of the pain can be found. Before that point, your health care provider may not know if you need more testing or further treatment. HOME CARE INSTRUCTIONS Monitor your abdominal pain for any changes. The following actions may help to alleviate any discomfort you are experiencing:  Only take over-the-counter or prescription medicines as directed by your health care provider.  Do not take laxatives unless directed to do so by your health care provider.  Try a clear liquid diet (broth, tea, or water) as directed by your health care provider. Slowly move to a bland diet as tolerated. SEEK MEDICAL CARE IF:  You have unexplained abdominal pain.  You have abdominal pain associated with nausea or diarrhea.  You have pain when you urinate or have a bowel movement.  You experience abdominal pain that wakes you in the night.  You have abdominal pain that is worsened or improved by eating food.  You have abdominal pain that is worsened with eating fatty foods.  You have a fever. SEEK IMMEDIATE MEDICAL CARE IF:  Your pain does not go away within 2 hours.  You keep throwing up (vomiting).  Your pain is felt only in portions of the abdomen, such as the right side or the left lower portion of the abdomen.  You pass bloody or black tarry stools. MAKE SURE YOU:  Understand these instructions.  Will watch your condition.  Will get help right away if you are not doing well or get worse.   This information is not intended to replace advice given to you by your health care provider. Make sure you discuss  any questions you have with your health care provider.   Document Released: 04/22/2005 Document Revised: 04/03/2015 Document Reviewed: 03/22/2013 Elsevier Interactive Patient Education 2016 [employer]  Acute Kidney Injury Acute kidney injury is any condition in which there is sudden (acute) damage to the kidneys. Acute kidney injury was previously known as acute kidney failure or acute renal failure. The kidneys are two organs that lie on either side of the spine between the middle of the back and the front of the abdomen. The kidneys:  Remove wastes and extra water from the blood.   Produce important hormones. These help keep bones strong, regulate blood pressure, and help create red blood cells.   Balance the fluids and chemicals in the blood and tissues. A small amount of kidney damage may not cause problems, but a large amount of damage may make it difficult or impossible for the kidneys to work the way they should. Acute kidney injury may develop into long-lasting (chronic) kidney disease. It may also develop into a life-threatening disease called end-stage kidney disease. Acute kidney injury can get worse very quickly, so it should be treated right away. Early treatment may prevent other kidney diseases from developing. CAUSES   A problem with blood flow to the kidneys. This may be caused by:   Blood loss.   Heart disease.   Severe burns.   Liver disease.  Direct damage to the kidneys. This may be caused by:  Some medicines.   A kidney infection.  Poisoning or consuming toxic substances.   A surgical wound.   A blow to the kidney area.   A problem with urine flow. This may be caused by:   Cancer.   Kidney stones.   An enlarged prostate. SIGNS AND SYMPTOMS   Swelling (edema) of the legs, ankles, or feet.   Tiredness (lethargy).   Nausea or vomiting.   Confusion.   Problems with urination, such as:   Painful or burning feeling  during urination.   Decreased urine production.   Frequent accidents in children who are potty trained.   Bloody urine.   Muscle twitches and cramps.   Shortness of breath.   Seizures.   Chest pain or pressure. Sometimes, no symptoms are present. DIAGNOSIS Acute kidney injury may be detected and diagnosed by tests, including blood, urine, imaging, or kidney biopsy tests.  TREATMENT Treatment of acute kidney injury varies depending on the cause and severity of the kidney damage. In mild cases, no treatment may be needed. The kidneys may heal on their own. If acute kidney injury is more severe, your health care provider will treat the cause of the kidney damage, help the kidneys heal, and prevent complications from occurring. Severe cases may require a procedure to remove toxic wastes from the body (dialysis) or surgery to repair kidney damage. Surgery may involve:   Repair of a torn kidney.   Removal of an obstruction. HOME CARE INSTRUCTIONS  Follow your prescribed diet.  Take medicines only as directed by your health care provider.  Do not take any new medicines (prescription, over-the-counter, or nutritional supplements) unless approved by your health care provider. Many medicines can worsen your kidney damage or may need to have the dose adjusted.   Keep all follow-up visits as directed by your health care provider. This is important.  Observe your condition to make sure you are healing as expected. SEEK IMMEDIATE MEDICAL CARE IF:  You are feeling ill or have severe pain in the back or side.   Your symptoms return or you have new symptoms.  You have any symptoms of end-stage kidney disease. These include:   Persistent itchiness.   Loss of appetite.   Headaches.   Abnormally dark or light skin.  Numbness in the hands or feet.   Easy bruising.   Frequent hiccups.   Menstruation stops.   You have a fever.  You have increased urine  production.  You have pain or bleeding when urinating. MAKE SURE YOU:   Understand these instructions. Will watch your condition. Dehydration, Adult Dehydration is a condition in which you do not have enough fluid or water in your body. It happens when you take in less fluid than you lose. Vital organs such as the kidneys, brain, and heart cannot function without a proper amount of fluids. Any loss of fluids from the body can cause dehydration.  Dehydration can range from mild to severe. This condition should be treated right away to help prevent it from becoming severe. CAUSES  This condition may be caused by: Vomiting. Diarrhea. Excessive sweating, such as when exercising in hot or humid weather. Not drinking enough fluid during strenuous exercise or during an illness. Excessive urine output. Fever. Certain medicines. RISK FACTORS This condition is more likely to develop in: People who are taking certain medicines that cause the body to lose excess fluid (diuretics).  People who have a chronic illness, such as diabetes, that may increase urination. Older adults.  People who live at high altitudes.  People who participate in endurance sports.  SYMPTOMS  Mild Dehydration Thirst. Dry lips. Slightly dry mouth. Dry, warm skin. Moderate Dehydration Very dry mouth.  Muscle cramps.  Dark urine and decreased urine production.  Decreased tear production.  Headache.  Light-headedness, especially when you stand up from a sitting position.  Severe Dehydration Changes in skin.  Cold and clammy skin.  Skin does not spring back quickly when lightly pinched and released.  Changes in body fluids.  Extreme thirst.  No tears.  Not able to sweat when body temperature is high, such as in hot weather.  Minimal urine production.  Changes in vital signs.  Rapid, weak pulse (more than 100 beats per minute when you are sitting still).  Rapid breathing.  Low blood  pressure.  Other changes.  Sunken eyes.  Cold hands and feet.  Confusion. Lethargy and difficulty being awakened. Fainting (syncope).  Short-term weight loss.  Unconsciousness. DIAGNOSIS  This condition may be diagnosed based on your symptoms. You may also have tests to determine how severe your dehydration is. These tests may include:  Urine tests.  Blood tests.  TREATMENT  Treatment for this condition depends on the severity. Mild or moderate dehydration can often be treated at home. Treatment should be started right away. Do not wait until dehydration becomes severe. Severe dehydration needs to be treated at the hospital. Treatment for Mild Dehydration Drinking plenty of water to replace the fluid you have lost.  Replacing minerals in your blood (electrolytes) that you may have lost.  Treatment for Moderate Dehydration Consuming oral rehydration solution (ORS). Treatment for Severe Dehydration Receiving fluid through an IV tube.  Receiving electrolyte solution through a feeding tube that is passed through your nose and into your stomach (nasogastric tube or NG tube). Correcting any abnormalities in electrolytes. HOME CARE INSTRUCTIONS  Drink enough fluid to keep your urine clear or pale yellow.  Drink water or fluid slowly by taking small sips. You can also try sucking on ice cubes. Have food or beverages that contain electrolytes. Examples include bananas and sports drinks. Take over-the-counter and prescription medicines only as told by your health care provider.  Prepare ORS according to the manufacturer's instructions. Take sips of ORS every 5 minutes until your urine returns to normal. If you have vomiting or diarrhea, continue to try to drink water, ORS, or both.  If you have diarrhea, avoid:  Beverages that contain caffeine.  Fruit juice.  Milk.  Carbonated soft drinks. Do not take salt tablets. This can lead to the condition of having too much  sodium in your body (hypernatremia).  SEEK MEDICAL CARE IF: You cannot eat or drink without vomiting. You have had moderate diarrhea during a period of more than 24 hours. You have a fever. SEEK IMMEDIATE MEDICAL CARE IF:  You have extreme thirst. You have severe diarrhea. You have not urinated in 6-8 hours, or you have urinated only a small amount of very dark urine. You have shriveled skin. You are dizzy, confused, or both.   This information is not intended to replace advice given to you by your health care provider. Make sure you discuss any questions you have with your health care provider.   Document Released: 07/13/2005 Document Revised: 04/03/2015 Document Reviewed: 11/28/2014 Elsevier Interactive Patient Education 2016 [employer] Inc[employer] Nausea and Vomiting Nausea is a sick feeling that often comes before throwing up (vomiting). Vomiting is a reflex where stomach contents come out of your mouth. Vomiting can cause severe loss of  body fluids (dehydration). Children and elderly adults can become dehydrated quickly, especially if they also have diarrhea. Nausea and vomiting are symptoms of a condition or disease. It is important to find the cause of your symptoms. CAUSES  Direct irritation of the stomach lining. This irritation can result from increased acid production (gastroesophageal reflux disease), infection, food poisoning, taking certain medicines (such as nonsteroidal anti-inflammatory drugs), alcohol use, or tobacco use. Signals from the brain.These signals could be caused by a headache, heat exposure, an inner ear disturbance, increased pressure in the brain from injury, infection, a tumor, or a concussion, pain, emotional stimulus, or metabolic problems. An obstruction in the gastrointestinal tract (bowel obstruction). Illnesses such as diabetes, hepatitis, gallbladder problems, appendicitis, kidney problems, cancer, sepsis, atypical symptoms of a heart attack, or eating  disorders. Medical treatments such as chemotherapy and radiation. Receiving medicine that makes you sleep (general anesthetic) during surgery. DIAGNOSIS Your caregiver may ask for tests to be done if the problems do not improve after a few days. Tests may also be done if symptoms are severe or if the reason for the nausea and vomiting is not clear. Tests may include: Urine tests. Blood tests. Stool tests. Cultures (to look for evidence of infection). X-rays or other imaging studies. Test results can help your caregiver make decisions about treatment or the need for additional tests. TREATMENT You need to stay well hydrated. Drink frequently but in small amounts.You may wish to drink water, sports drinks, clear broth, or eat frozen ice pops or gelatin dessert to help stay hydrated.When you eat, eating slowly may help prevent nausea.There are also some antinausea medicines that may help prevent nausea. HOME CARE INSTRUCTIONS  Take all medicine as directed by your caregiver. If you do not have an appetite, do not force yourself to eat. However, you must continue to drink fluids. If you have an appetite, eat a normal diet unless your caregiver tells you differently. Eat a variety of complex carbohydrates (rice, wheat, potatoes, bread), lean meats, yogurt, fruits, and vegetables. Avoid high-fat foods because they are more difficult to digest. Drink enough water and fluids to keep your urine clear or pale yellow. If you are dehydrated, ask your caregiver for specific rehydration instructions. Signs of dehydration may include: Severe thirst. Dry lips and mouth. Dizziness. Dark urine. Decreasing urine frequency and amount. Confusion. Rapid breathing or pulse. SEEK IMMEDIATE MEDICAL CARE IF:  You have blood or brown flecks (like coffee grounds) in your vomit. You have black or bloody stools. You have a severe headache or stiff neck. You are confused. You have severe abdominal pain. You  have chest pain or trouble breathing. You do not urinate at least once every 8 hours. You develop cold or clammy skin. You continue to vomit for longer than 24 to 48 hours. You have a fever. MAKE SURE YOU:  Understand these instructions. Will watch your condition. Will get help right away if you are not doing well or get worse.   This information is not intended to replace advice given to you by your health care provider. Make sure you discuss any questions you have with your health care provider.   Document Released: 07/13/2005 Document Revised: 10/05/2011 Document Reviewed: 12/10/2010 Elsevier Interactive Patient Education 2016 [place] Bowel Obstruction A small bowel obstruction is a blockage in the small bowel. The small bowel, which is also called the small intestine, is a long, slender tube that connects the stomach to the colon. When a person eats and  drinks, food and fluids go from the stomach to the small bowel. This is where most of the nutrients in the food and fluids are absorbed. A small bowel obstruction will prevent food and fluids from passing through the small bowel as they normally do during digestion. The small bowel can become partially or completely blocked. This can cause symptoms such as abdominal pain, vomiting, and bloating. If this condition is not treated, it can be dangerous because the small bowel could rupture. CAUSES Common causes of this condition include:  Scar tissue from previous surgery or radiation treatment.  Recent surgery. This may cause the movements of the bowel to slow down and cause food to block the intestine.  Hernias.  Inflammatory bowel disease (colitis).  Twisting of the bowel (volvulus).  Tumors.  A foreign body.  Slipping of a part of the bowel into another part (intussusception). SYMPTOMS Symptoms of this condition include:  Abdominal pain. This may be dull cramps or sharp pain. It may occur in one area, or it may  be present in the entire abdomen. Pain can range from mild to severe, depending on the degree of obstruction.  Nausea and vomiting. Vomit may be greenish or a yellow bile color.  Abdominal bloating.  Constipation.  Lack of passing gas.  Frequent belching.  Diarrhea. This may occur if the obstruction is partial and runny stool is able to leak around the obstruction. DIAGNOSIS This condition may be diagnosed based on a physical exam, medical history, and X-rays of the abdomen. You may also have other tests, such as a CT scan of the abdomen and pelvis. TREATMENT Treatment for this condition depends on the cause and severity of the problem. Treatment options may include:  Bed rest along with fluids and pain medicines that are given through an IV tube inserted into one of your veins. Sometimes, this is all that is needed for the obstruction to improve.  Following a simple diet. In some cases, a clear liquid diet may be required for several days. This allows the bowel to rest.  Placement of a small tube (nasogastric tube) into the stomach. When the bowel is blocked, it usually swells up like a balloon that is filled with air and fluids. The air and fluids may be removed by suction through the nasogastric tube. This can help with pain, discomfort, and nausea. It can also help the obstruction to clear up faster.  Surgery. This may be required if other treatments do not work. Bowel obstruction from a hernia may require early surgery and can be an emergency procedure. Surgery may also be required for scar tissue that causes frequent or severe obstructions. HOME CARE INSTRUCTIONS  Get plenty of rest.  Follow instructions from your health care provider about eating restrictions. You may need to avoid solid foods and consume only clear liquids until your condition improves.  Take over-the-counter and prescription medicines only as told by your health care provider.  Keep all follow-up visits as  told by your health care provider. This is important. SEEK MEDICAL CARE IF:  You have a fever.  You have chills. SEEK IMMEDIATE MEDICAL CARE IF:  You have increased pain or cramping.  You vomit blood.  You have uncontrolled vomiting or nausea.  You cannot drink fluids because of vomiting or pain.  You develop confusion.  You begin feeling very dry or thirsty (dehydrated).  You have severe bloating.  You feel extremely weak or you faint.   This information is not intended to  replace advice given to you by your health care provider. Make sure you discuss any questions you have with your health care provider.   Document Released: 09/29/2005 Document Revised: 04/03/2015 Document Reviewed: 09/06/2014 Elsevier Interactive Patient Education 2016 Else[employer] Inc. Hyponatremia Hyponatremia is when the amount of salt (sodium) in your blood is too low. When sodium levels are low, your cells absorb extra water and they swell. The swelling happens throughout the body, but it mostly affects the brain. CAUSES This condition may be caused by:  Heart, kidney, or liver problems.  Thyroid problems.  Adrenal gland problems.  Metabolic conditions, such as syndrome of inappropriate antidiuretic hormone (SIADH).  Severe vomiting and diarrhea.  Certain medicines or illegal drugs.  Dehydration.  Drinking too much water.  Eating a diet that is low in sodium.  Large burns on your body.  Sweating. RISK FACTORS This condition is more likely to develop in people who:  Have long-term (chronic) kidney disease.  Have heart failure.  Have a medical condition that causes frequent or excessive diarrhea.  Have metabolic conditions, such as Addison disease or SIADH.  Take certain medicines that affect the sodium and fluid balance in the blood. Some of these medicine types include:  Diuretics.  NSAIDs.  Some opioid pain medicines.  Some antidepressants.  Some seizure prevention  medicines. SYMPTOMS  Symptoms of this condition include:  Nausea and vomiting.  Confusion.  Lethargy.  Agitation.  Headache.  Seizures.  Unconsciousness.  Appetite loss.  Muscle weakness and cramping.  Feeling weak or light-headed.  Having a rapid heart rate.  Fainting, in severe cases. DIAGNOSIS This condition is diagnosed with a medical history and physical exam. You will also have other tests, including:  Blood tests.  Urine tests. TREATMENT Treatment for this condition depends on the cause. Treatment may include:  Fluids given through an IV tube that is inserted into one of your veins.  Medicines to correct the sodium imbalance. If medicines are causing the condition, the medicines will need to be adjusted.  Limiting water or fluid intake to get the correct sodium balance. HOME CARE INSTRUCTIONS  Take medicines only as directed by your health care provider. Many medicines can make this condition worse. Talk with your health care provider about any medicines that you are currently taking.  Carefully follow a recommended diet as directed by your health care provider.  Carefully follow instructions from your health care provider about fluid restrictions.  Keep all follow-up visits as directed by your health care provider. This is important.  Do not drink alcohol. SEEK MEDICAL CARE IF:  You develop worsening nausea, fatigue, headache, confusion, or weakness.  Your symptoms go away and then return.  You have problems following the recommended diet. SEEK IMMEDIATE MEDICAL CARE IF:  You have a seizure.  You faint.  You have ongoing diarrhea or vomiting.   This information is not intended to replace advice given to you by your health care provider. Make sure you discuss any questions you have with your health care provider.   Document Released: 07/03/2002 Document Revised: 11/27/2014 Document Reviewed: 08/02/2014 Elsevier Interactive Patient Education  2016 [employer]. Hypokalemia Hypokalemia means that the amount of potassium in the blood is lower than normal.Potassium is a chemical, called an electrolyte, that helps regulate the amount of fluid in the body. It also stimulates muscle contraction and helps nerves function properly.Most of the body's potassium is inside of cells, and only a very small amount is in the blood. Because the  amount in the blood is so small, minor changes can be life-threatening. CAUSES  Antibiotics.  Diarrhea or vomiting.  Using laxatives too much, which can cause diarrhea.  Chronic kidney disease.  Water pills (diuretics).  Eating disorders (bulimia).  Low magnesium level.  Sweating a lot. SIGNS AND SYMPTOMS  Weakness.  Constipation.  Fatigue.  Muscle cramps.  Mental confusion.  Skipped heartbeats or irregular heartbeat (palpitations).  Tingling or numbness. DIAGNOSIS  Your health care provider can diagnose hypokalemia with blood tests. In addition to checking your potassium level, your health care provider may also check other lab tests. TREATMENT Hypokalemia can be treated with potassium supplements taken by mouth or adjustments in your current medicines. If your potassium level is very low, you may need to get potassium through a vein (IV) and be monitored in the hospital. A diet high in potassium is also helpful. Foods high in potassium are:  Nuts, such as peanuts and pistachios.  Seeds, such as sunflower seeds and pumpkin seeds.  Peas, lentils, and lima beans.  Whole grain and bran cereals and breads.  Fresh fruit and vegetables, such as apricots, avocado, bananas, cantaloupe, kiwi, oranges, tomatoes, asparagus, and potatoes.  Orange and tomato juices.  Red meats.  Fruit yogurt. HOME CARE INSTRUCTIONS  Take all medicines as prescribed by your health care provider.  Maintain a healthy diet by including nutritious food, such as fruits, vegetables, nuts, whole grains,  and lean meats.  If you are taking a laxative, be sure to follow the directions on the label. SEEK MEDICAL CARE IF:  Your weakness gets worse.  You feel your heart pounding or racing.  You are vomiting or having diarrhea.  You are diabetic and having trouble keeping your blood glucose in the normal range. SEEK IMMEDIATE MEDICAL CARE IF:  You have chest pain, shortness of breath, or dizziness.  You are vomiting or having diarrhea for more than 2 days.  You faint. MAKE SURE YOU:   Understand these instructions.  Will watch your condition.  Will get help right away if you are not doing well or get worse.   This information is not intended to replace advice given to you by your health care provider. Make sure you discuss any questions you have with your health care provider.   Document Released: 07/13/2005 Document Revised: 08/03/2014 Document Reviewed: 01/13/2013 Elsevier Interactive Patient Education [employer].    Will get help right away if you are not doing well or get worse.   This information is not intended to replace advice given to you by your health care provider. Make sure you discuss any questions you have with your health care provider.   Document Released: 01/26/2011 Document Revised: 08/03/2014 Document Reviewed: 03/11/2012 Elsevier Interactive Patient Education [employer].

## 2015-12-28 NOTE — ED Notes (Signed)
Abdominal pain x 2 days, seen at [facility] with diagnosed bowel obstruction.

## 2015-12-28 NOTE — ED Notes (Signed)
Pt satting 89% r/a. Placed on 2 L [place].

## 2015-12-28 NOTE — ED Notes (Signed)
Patient c/o vomiting black liquid x 3 days. Patient believe he has food poisoning for eating chinese food while in [place] x  3 days ago. Per patient experience same symptom before when he had dx with food poison in the past. Patient symptoms include vomit / heart burn / legs cramps / dry mouth.

## 2015-12-28 NOTE — ED Provider Notes (Signed)
[facility] Emergency Department Provider Note   ____________________________________________  Time seen: Approximately 1 PM  I have reviewed the triage vital signs and the nursing notes.   HISTORY  Chief Complaint Abdominal Pain   HPI Aaron Mitchell is a 70 y.o. male with a history of hypertension as well as obesity who is presenting to the emergency department with 3 days of nausea and vomiting with abdominal distention and cramping. He denies any abdominal pain at this time. Says that his vomitus has been black. Denies any blood in his stool but was seen in urgent care today and found to be heme positive. Does not have any history of abdominal surgeries. Denies any chest pain.Was sent to the emergency department because of acute renal failure as well as the bowel obstruction.  He says he has not urinated since 5 PM yesterday. Says that he has not eaten anything in 3 days.  Also says that he is not moving his bowels or passing gas today. Past Medical History  Diagnosis Date  . Male hypogonadism   . Hypertension   . Gout   . Chronic back pain     L4 deformity    Patient Active Problem List   Diagnosis Date Noted  . Left Achilles tendinitis 10/04/2015  . HLD (hyperlipidemia) 09/17/2015  . Degeneration of intervertebral disc of lumbar region 08/22/2015  . Disorder of vertebra 08/22/2015  . Benign prostatic hyperplasia with urinary obstruction 03/25/2015  . Other long term (current) drug therapy 03/25/2015  . ED (erectile dysfunction) of organic origin 03/25/2015  . CMC arthritis, thumb, degenerative 01/19/2013  . Triggering of digit 01/19/2013  . Rotator cuff impingement syndrome 10/20/2011  . Chronic gouty arthropathy 04/02/2011  . Eunuchoidism 04/02/2011  . Morbid obesity ([place]) 04/02/2011  . Arthritis, degenerative 04/02/2011  . Allergic rhinitis, seasonal 04/02/2011  . Bursitis of shoulder 04/02/2011  . Borderline diabetes mellitus 03/10/2011   . Essential (primary) hypertension 03/10/2011    Past Surgical History  Procedure Laterality Date  . Trigger finger release Right   . Carpal tunnel release Right 2015  . Tonsillectomy    . Rhinoplasty      Current Outpatient Rx  Name  Route  Sig  Dispense  Refill  . allopurinol (ZYLOPRIM) 300 MG tablet   Oral   Take 1 tablet (300 mg total) by mouth daily.   90 tablet   1   . amLODipine (NORVASC) 5 MG tablet      Take 1 tablet by mouth  twice a day   180 tablet   1   . fluticasone (FLONASE) 50 MCG/ACT nasal spray   Each Nare   Place 2 sprays into both nostrils daily.   16 g   0   . levocetirizine (XYZAL) 5 MG tablet   Oral   Take 1 tablet (5 mg total) by mouth every evening.   30 tablet   0   . lisinopril-hydrochlorothiazide (PRINZIDE,ZESTORETIC) 20-12.5 MG tablet   Oral   Take 1 tablet by mouth daily.   30 tablet   0   . testosterone cypionate (DEPOTESTOSTERONE CYPIONATE) 200 MG/ML injection   Intramuscular   Inject into the muscle.           Allergies Codeine  Family History  Problem Relation Age of Onset  . Alcohol abuse Father   . Hypertension Father   . CVA Father 38    Social History Social History  Substance Use Topics  . Smoking status: Former Smoker  Quit date: 07/27/1978  . Smokeless tobacco: None  . Alcohol Use: Yes    Review of Systems Constitutional: No fever/chills Eyes: No visual changes. ENT: No sore throat. Cardiovascular: Denies chest pain. Respiratory: Denies shortness of breath. Gastrointestinal:  As above Genitourinary: Negative for dysuria. Musculoskeletal: Negative for back pain. Skin: Negative for rash. Neurological: Negative for headaches, focal weakness or numbness.  10-point ROS otherwise negative.  ____________________________________________   PHYSICAL EXAM:  VITAL SIGNS: ED Triage Vitals  Enc Vitals Group     BP 12/28/15 1209 181/91 mmHg     Pulse Rate 12/28/15 1209 99     Resp 12/28/15 1209  20     Temp 12/28/15 1209 98.4 F (36.9 C)     Temp Source 12/28/15 1209 Oral     SpO2 12/28/15 1209 94 %     Weight 12/28/15 1209 280 lb (127.007 kg)     Height 12/28/15 1209 5\' 11"  (1.803 m)     Head Cir --      Peak Flow --      Pain Score 12/28/15 1210 5     Pain Loc --      Pain Edu? --      Excl. in [place]? --     Constitutional: Alert and oriented. Well appearing and in no acute distress. Eyes: Conjunctivae are normal. PERRL. EOMI. Head: Atraumatic. Nose: No congestion/rhinnorhea. Mouth/Throat: Mucous membranes are Dry.  Neck: No stridor.   Cardiovascular: Normal rate, regular rhythm. Grossly normal heart sounds.   Respiratory: Normal respiratory effort.  No retractions. Lungs CTAB. Gastrointestinal: Soft and nontender. Positive for distention.  No CVA tenderness. Musculoskeletal: No lower extremity tenderness nor edema.  No joint effusions. Neurologic:  Normal speech and language. No gross focal neurologic deficits are appreciated.  Skin:  Skin is warm, dry and intact. No rash noted. Psychiatric: Mood and affect are normal. Speech and behavior are normal.  ____________________________________________   LABS (all labs ordered are listed, but only abnormal results are displayed)  Labs Reviewed - No data to display ____________________________________________  EKG   ____________________________________________  RADIOLOGY  Plain film of the abdomen reviewed with evidence of small bowel obstruction. ____________________________________________   PROCEDURES  ____________________________________________   INITIAL IMPRESSION / ASSESSMENT AND PLAN / ED COURSE  Pertinent labs & imaging results that were available during my care of the patient were reviewed by me and considered in my medical decision making (see chart for details).  ----------------------------------------- 2:04 PM on 12/28/2015 -----------------------------------------  Patient with acute renal  failure likely from dehydration from small bowel obstruction. We'll start NG tube. Will start Protonix. Does have the black vomitus at the bedside and was found to be heme positive from below at urgent care. Discussed case with Dr. Burt Knack who agrees to admit the patient. Dr. Burt Knack recommended a CAT scan of the abdomen and pelvis to further characterize the obstruction. ____________________________________________   FINAL CLINICAL IMPRESSION(S) / ED DIAGNOSES  Final diagnoses:  Acute gastrointestinal bleeding  Acute renal failure, unspecified acute renal failure type (HCC)  SBO (small bowel obstruction) (HCC)      NEW MEDICATIONS STARTED DURING THIS VISIT:  New Prescriptions   No medications on file     Note:  This document was prepared using Dragon voice recognition software and may include unintentional dictation errors.    Orbie Pyo, MD 12/28/15 [phone]

## 2015-12-28 NOTE — H&P (Signed)
[place] [facility] [place] NAME: Aaron Mitchell    MR#:  [id number]  DATE OF BIRTH:  [birth year]  DATE OF ADMISSION:  12/28/2015  PRIMARY CARE PHYSICIAN: Dion Body, MD   REQUESTING/REFERRING PHYSICIAN: Dr. Meade Maw  CHIEF COMPLAINT:   Chief Complaint  Patient presents with  . Abdominal Pain    HISTORY OF PRESENT ILLNESS:  Aaron Mitchell  is a 70 y.o. male with a known history of Hypertension, obesity, hypogonadism, borderline diabetes mellitus, arthritis presents to the hospital secondary to 2-3 day history of worsening nausea and vomiting with abdominal discomfort. Patient has been on vacation in [place] for the past week and half. Prior to returning 3 days ago he had food at a [employer]. As soon as he returned, he started having worsening nausea and vomiting which resulted in abdominal discomfort and bloating sensation in the belly. No bowel movements to passing minimal gas. Denies any diarrhea. No fevers or chills, but has been feeling extremely weak. He had minimal urination since yesterday, inability to eat anything for 3 days now, presented to the emergency room. X-rays revealed possible partial small bowel obstruction. CT of the abdomen showing enteritis and ileus. Labs also indicate acute renal failure. Surgery was consulted, recommended medical admission as no acute indication for surgery. NG tube was placed and patient has almost 3.7 L of greenish brown gastric fluid on suction.  PAST MEDICAL HISTORY:   Past Medical History  Diagnosis Date  . Male hypogonadism   . Hypertension   . Gout   . Chronic back pain     L4 deformity  . Borderline diabetes mellitus   . Obesity   . Degenerative disc disease, lumbar   . Erectile dysfunction   . Seasonal allergies   . Shoulder bursitis   . Hyperlipidemia   . Achilles tendinitis     Left leg    PAST SURGICAL HISTORY:   Past Surgical History  Procedure  Laterality Date  . Trigger finger release Right   . Carpal tunnel release Right 2015  . Tonsillectomy    . Rhinoplasty    . Adenoidectomy      SOCIAL HISTORY:   Social History  Substance Use Topics  . Smoking status: Former Smoker    Quit date: 07/27/1978  . Smokeless tobacco: Not on file     Comment: Quit in 1980  . Alcohol Use: 0.0 oz/week    0 Standard drinks or equivalent per week     Comment: Rarely    FAMILY HISTORY:   Family History  Problem Relation Age of Onset  . Alcohol abuse Father   . Hypertension Father   . CVA Father 42    DRUG ALLERGIES:   Allergies  Allergen Reactions  . Codeine Other (See Comments)    Other reaction(s): Other (See Comments) HEADACHES Causes headaches when he stops liquid Codeine-based products    REVIEW OF SYSTEMS:   Review of Systems  Constitutional: Positive for malaise/fatigue. Negative for fever, chills and weight loss.  HENT: Negative for ear discharge, ear pain, hearing loss and nosebleeds.   Eyes: Negative for blurred vision, double vision and photophobia.  Respiratory: Negative for cough, hemoptysis, shortness of breath and wheezing.   Cardiovascular: Negative for chest pain, palpitations, orthopnea and leg swelling.  Gastrointestinal: Positive for nausea, vomiting and abdominal pain. Negative for heartburn, diarrhea, constipation and melena.  Genitourinary: Negative for dysuria, urgency and frequency.       Oliguria  Musculoskeletal: Positive for back pain. Negative for myalgias and neck pain.  Skin: Negative for rash.  Neurological: Negative for dizziness, tingling, tremors, sensory change, speech change, focal weakness and headaches.  Endo/Heme/Allergies: Does not bruise/bleed easily.  Psychiatric/Behavioral: Negative for depression.    MEDICATIONS AT HOME:   Prior to Admission medications   Medication Sig Start Date End Date Taking? Authorizing Provider  allopurinol (ZYLOPRIM) 300 MG tablet Take 1 tablet (300  mg total) by mouth daily. 10/24/15  Yes Lyndal Pulley, DO  amLODipine (NORVASC) 5 MG tablet Take 1 tablet by mouth  twice a day 04/17/15  Yes Guadalupe Maple, MD  diclofenac (VOLTAREN) 75 MG EC tablet Take 75 mg by mouth 2 (two) times daily as needed. For pain. 02/04/15  Yes Historical Provider, MD  diclofenac sodium (VOLTAREN) 1 % GEL Apply 2 g topically daily as needed. Apply to both hands.   Yes Historical Provider, MD  fluticasone (FLONASE) 50 MCG/ACT nasal spray Place 2 sprays into both nostrils daily. 08/14/15  Yes Arnetha Courser, MD  levocetirizine (XYZAL) 5 MG tablet Take 1 tablet (5 mg total) by mouth every evening. 08/14/15  Yes Arnetha Courser, MD  lisinopril-hydrochlorothiazide (PRINZIDE,ZESTORETIC) 20-12.5 MG tablet Take 1 tablet by mouth daily. 08/14/15  Yes Arnetha Courser, MD  metaxalone (SKELAXIN) 800 MG tablet Take 800 mg by mouth 3 (three) times daily as needed. For muscle RELAXER.   Yes Historical Provider, MD  Multiple Vitamins tablet Take 1 tablet by mouth daily.   Yes Historical Provider, MD  testosterone cypionate (DEPOTESTOSTERONE CYPIONATE) 200 MG/ML injection Inject 100 mg into the muscle every 7 (seven) days. Use on Friday. 07/02/15  Yes Historical Provider, MD  zolpidem (AMBIEN) 10 MG tablet Take 10 mg by mouth at bedtime as needed. For sleep.   Yes Historical Provider, MD      VITAL SIGNS:  Blood pressure 116/53, pulse 99, temperature 98.4 F (36.9 C), temperature source Oral, resp. rate 21, height 5\' 11"  (1.803 m), weight 127.007 kg (280 lb), SpO2 97 %.  PHYSICAL EXAMINATION:   Physical Exam  GENERAL:  70 y.o.-year-old Obese patient lying in the bed with no acute distress.  EYES: Pupils equal, round, reactive to light and accommodation. No scleral icterus. Extraocular muscles intact.  HEENT: Head atraumatic, normocephalic. Oropharynx and nasopharynx clear. NG tube in place. NECK:  Supple, no jugular venous distention. No thyroid enlargement, no tenderness.  LUNGS:  Normal breath sounds bilaterally, no wheezing, rales,rhonchi or crepitation. No use of accessory muscles of respiration.  CARDIOVASCULAR: S1, S2 normal. No murmurs, rubs, or gallops.  ABDOMEN: Soft, nontender, mildly distended. hypoactive Bowel sounds present. No organomegaly or mass.  EXTREMITIES: No pedal edema, cyanosis, or clubbing.  NEUROLOGIC: Cranial nerves II through XII are intact. Muscle strength 5/5 in all extremities. Sensation intact. Gait not checked.  PSYCHIATRIC: The patient is alert and oriented x 3.  SKIN: No obvious rash, lesion, or ulcer.   LABORATORY PANEL:   CBC  Recent Labs Lab 12/28/15 0918  WBC 9.9  HGB 18.9*  HCT 54.4*  PLT 211   ------------------------------------------------------------------------------------------------------------------  Chemistries   Recent Labs Lab 12/28/15 0918  NA 132*  K 3.0*  CL 78*  CO2 37*  GLUCOSE 225*  BUN 35*  CREATININE 2.57*  CALCIUM 10.0  AST 31  ALT 25  ALKPHOS 52  BILITOT 1.4*   ------------------------------------------------------------------------------------------------------------------  Cardiac Enzymes No results for input(s): TROPONINI in the last 168 hours. ------------------------------------------------------------------------------------------------------------------  RADIOLOGY:  Ct Abdomen Pelvis  Wo Contrast  12/28/2015  CLINICAL DATA:  Diffuse abdominal pain with nausea and vomiting for 3 days. Renal insufficiency. EXAM: CT ABDOMEN AND PELVIS WITHOUT CONTRAST TECHNIQUE: Multidetector CT imaging of the abdomen and pelvis was performed following the standard protocol without IV contrast. COMPARISON:  None. FINDINGS: Lower chest:  No acute findings. Hepatobiliary: No mass visualized on this un-enhanced exam. Pancreas: No mass or inflammatory process identified on this un-enhanced exam. Diffuse hepatic steatosis is noted. 1.5 cm calcified gallstone is seen without evidence of cholecystitis or  biliary ductal dilatation. Spleen: Within normal limits in size. Adrenals/Urinary Tract: No evidence of urolithiasis or hydronephrosis. Unopacified urinary bladder is unremarkable in appearance. Stomach/Bowel: Nasogastric tube tip is seen within the proximal stomach. Proximal small bowel shows mild dilatation with air-fluid levels. No significant bowel wall thickening or mesenteric inflammatory changes. No discrete transition point. No focal inflammatory process or abnormal fluid collections identified. Vascular/Lymphatic: No pathologically enlarged lymph nodes. No evidence of abdominal aortic aneurysm. Aortic atherosclerotic calcification noted. Reproductive: No mass or other significant abnormality. Other: None. Musculoskeletal:  No suspicious bone lesions identified. IMPRESSION: Mild dilatation of proximal small bowel loops containing air-fluid levels, without definite evidence of obstruction or focal inflammatory process. This is nonspecific but may be due to mild small bowel ileus/enteritis. Hepatic steatosis and cholelithiasis incidentally noted. No radiographic evidence of cholecystitis or biliary dilatation. Electronically Signed   By: Earle Gell M.D.   On: 12/28/2015 16:16   Dg Abd 1 View  12/28/2015  CLINICAL DATA:  Followup abdominal pain.  Assess NG tube placement EXAM: ABDOMEN - 1 VIEW COMPARISON:  12/28/2015 FINDINGS: The nasogastric tube tip is in the distal esophagus approximately 5 cm above the GE junction. Dilated loops of small bowel are again identified compatible with bowel obstruction. Gallstone is noted within the right upper quadrant of the abdomen. IMPRESSION: 1. The nasogastric tube is under advanced with tip above the GE junction. Electronically Signed   By: Kerby Moors M.D.   On: 12/28/2015 14:15   Dg Abd 1 View  12/28/2015  CLINICAL DATA:  Vomiting for 3 days. EXAM: ABDOMEN - 1 VIEW COMPARISON:  None. FINDINGS: Dilated small bowel loops are noted in the left abdomen and pelvis.  Decompressed small bowel loops in the right lower quadrant. Findings compatible with moderate to high-grade partial small bowel obstruction. No free air organomegaly. Gallstone noted in the right upper quadrant. No acute bony abnormality. IMPRESSION: Findings compatible with moderate to high-grade partial small bowel obstruction Electronically Signed   By: Rolm Baptise M.D.   On: 12/28/2015 09:35    EKG:  No orders found for this or any previous visit.  IMPRESSION AND PLAN:   Bradlyn Underwood  is a 70 y.o. male with a known history of Hypertension, obesity, hypogonadism, borderline diabetes mellitus, arthritis presents to the hospital secondary to 2-3 day history of worsening nausea and vomiting with abdominal discomfort.  #1 acute gastroenteritis- recent travel and also eating outside. -Possible inflammation on CT. Start on IV Cipro and Flagyl. -IV fluids and surgical consult. Continue NG tube with low continuous suction. -Remains NPO   #2 ileus versus partial small bowel obstruction-CT scan showing possible ileus. Surgical consult. -Continue NG tube and also NPO by mouth. - IV fluids  #3 acute renal failure-likely prerenal. IV fluids and monitor urine output strictly. -Recheck labs, if no improvement by tomorrow, will advise renal ultrasound. Bladder scan if remains oliguric.  #4 hypogonadism-follows with urology. On testosterone injections weekly.  #5-hypertension-hold blood pressure  medicine as blood pressure is borderline. Patient was taking lisinopril hydrochlorothiazide at home.  #6 hypokalemia-being replaced  #7 DVT prophylaxis-on Lovenox   All the records are reviewed and case discussed with ED provider. Management plans discussed with the patient, family and they are in agreement.  CODE STATUS: Full Code  TOTAL TIME TAKING CARE OF THIS PATIENT: 50 minutes.    Gladstone Lighter M.D on 12/28/2015 at 6:31 PM  Between 7am to 6pm - Pager - [phone]  After 6pm go to  [email or web address] - password [email or web address] [facility]  Office  [phone]  CC: Primary care physician; Dion Body, MD

## 2015-12-28 NOTE — ED Provider Notes (Signed)
CSN: [id number]     Arrival date & time 12/28/15  0814 History   First MD Initiated Contact with Patient 12/28/15 [phone]     Chief Complaint  Patient presents with  . Food Intolerance   (Consider location/radiation/quality/duration/timing/severity/associated sxs/prior Treatment) HPI Comments: Married caucasian male here for evaluation of vomiting x 3 days, recent travel to [place] 9 days ago via RV then returned home air travel.  Minimal burping, frequent vomiting with any po intake, bloating, small amount brown/black diarrhea daily, leg cramps today, feels weak "feels like my pyloric valve stuck open"  Woke up with black stuff on my teeth this am but doesn't remember vomiting.  Last urination yesterday afternoon dark yellow.  Accompanied by wife ambulatory today.  Never had anything like this happen before  Denied hospitalizations for GI complaints or PMHX EGD, ulcers bleeding, IBS, diverticulitis  PSHx tonsillectomy, CSP normal, rhinoplasty, carpal tunnel release, trigger finger release  FHx Father alcoholic  The history is provided by the patient.    Past Medical History  Diagnosis Date  . Male hypogonadism   . Hypertension   . Gout   . Chronic back pain     L4 deformity   Past Surgical History  Procedure Laterality Date  . Trigger finger release Right   . Carpal tunnel release Right 2015  . Tonsillectomy    . Rhinoplasty     Family History  Problem Relation Age of Onset  . Alcohol abuse Father   . Hypertension Father   . CVA Father 84   Social History  Substance Use Topics  . Smoking status: Former Smoker    Quit date: 07/27/1978  . Smokeless tobacco: None  . Alcohol Use: Yes    Review of Systems  Constitutional: Positive for activity change, appetite change and fatigue. Negative for fever, chills, diaphoresis and unexpected weight change.  HENT: Negative for congestion, dental problem, drooling, ear discharge, ear pain, facial swelling, hearing loss, mouth sores,  nosebleeds, postnasal drip, rhinorrhea, sinus pressure, sneezing, sore throat, tinnitus, trouble swallowing and voice change.   Eyes: Negative for photophobia, pain, discharge, redness, itching and visual disturbance.  Respiratory: Positive for cough. Negative for choking, chest tightness, shortness of breath, wheezing and stridor.   Cardiovascular: Negative for chest pain, palpitations and leg swelling.  Gastrointestinal: Positive for nausea, vomiting, abdominal pain, diarrhea and abdominal distention. Negative for constipation and blood in stool.  Endocrine: Negative for cold intolerance and heat intolerance.  Genitourinary: Negative for dysuria.  Musculoskeletal: Negative for myalgias, back pain, joint swelling, arthralgias, gait problem, neck pain and neck stiffness.  Skin: Negative for color change, pallor, rash and wound.  Allergic/Immunologic: Positive for environmental allergies. Negative for food allergies and immunocompromised state.  Neurological: Positive for weakness and light-headedness. Negative for dizziness, tremors, seizures, syncope, facial asymmetry, speech difficulty, numbness and headaches.  Hematological: Negative for adenopathy. Does not bruise/bleed easily.  Psychiatric/Behavioral: Positive for sleep disturbance. Negative for behavioral problems, confusion and agitation.    Allergies  Codeine  Home Medications   Prior to Admission medications   Medication Sig Start Date End Date Taking? Authorizing Provider  allopurinol (ZYLOPRIM) 300 MG tablet Take 1 tablet (300 mg total) by mouth daily. 10/24/15  Yes Lyndal Pulley, DO  amLODipine (NORVASC) 5 MG tablet Take 1 tablet by mouth  twice a day 04/17/15  Yes Guadalupe Maple, MD  fluticasone (FLONASE) 50 MCG/ACT nasal spray Place 2 sprays into both nostrils daily. 08/14/15  Yes Arnetha Courser, MD  levocetirizine (  XYZAL) 5 MG tablet Take 1 tablet (5 mg total) by mouth every evening. 08/14/15  Yes Arnetha Courser, MD   lisinopril-hydrochlorothiazide (PRINZIDE,ZESTORETIC) 20-12.5 MG tablet Take 1 tablet by mouth daily. 08/14/15  Yes Arnetha Courser, MD  testosterone cypionate (DEPOTESTOSTERONE CYPIONATE) 200 MG/ML injection Inject into the muscle. 07/02/15  Yes Historical Provider, MD   Meds Ordered and Administered this Visit   Medications  promethazine (PHENERGAN) injection 25 mg (25 mg Intramuscular Given 12/28/15 0944)  sodium chloride 0.9 % bolus 1,000 mL (1,000 mLs Intravenous Given 12/28/15 0945)    BP 112/65 mmHg  Pulse 96  Temp(Src) 96.5 F (35.8 C) (Oral)  Resp 18  Ht 5\' 11"  (1.803 m)  Wt 280 lb (127.007 kg)  BMI 39.07 kg/m2  SpO2 97% Orthostatic VS for the past 24 hrs:  BP- Lying Pulse- Lying BP- Sitting Pulse- Sitting BP- Standing at 0 minutes Pulse- Standing at 0 minutes  12/28/15 1009 134/64 mmHg 93 135/72 mmHg 91 138/79 mmHg 100    Physical Exam  Constitutional: He is oriented to person, place, and time. Vital signs are normal. He appears well-developed and well-nourished. He is active and cooperative.  Non-toxic appearance. He does not have a sickly appearance. He appears ill. No distress.  HENT:  Head: Normocephalic and atraumatic.  Right Ear: External ear and ear canal normal. A middle ear effusion is present.  Left Ear: Hearing, external ear and ear canal normal. A middle ear effusion is present.  Nose: No mucosal edema, rhinorrhea, nose lacerations, sinus tenderness, nasal deformity, septal deviation or nasal septal hematoma. No epistaxis.  No foreign bodies. Right sinus exhibits no maxillary sinus tenderness and no frontal sinus tenderness. Left sinus exhibits no maxillary sinus tenderness and no frontal sinus tenderness.  Mouth/Throat: Uvula is midline and mucous membranes are normal. Mucous membranes are not pale, not dry and not cyanotic. He does not have dentures. No oral lesions. No trismus in the jaw. Normal dentition. No dental abscesses, uvula swelling, lacerations or dental  caries. Posterior oropharyngeal edema and posterior oropharyngeal erythema present. No oropharyngeal exudate or tonsillar abscesses.  Cobblestoning posterior pharynx; bilateral TMs with air fluid level clear  Eyes: Conjunctivae, EOM and lids are normal. Pupils are equal, round, and reactive to light. Right eye exhibits no chemosis, no discharge, no exudate and no hordeolum. No foreign body present in the right eye. Left eye exhibits no chemosis, no discharge, no exudate and no hordeolum. No foreign body present in the left eye. Right conjunctiva is not injected. Right conjunctiva has no hemorrhage. Left conjunctiva is not injected. Left conjunctiva has no hemorrhage. No scleral icterus. Right eye exhibits normal extraocular motion and no nystagmus. Left eye exhibits normal extraocular motion and no nystagmus. Right pupil is round and reactive. Left pupil is round and reactive. Pupils are equal.  Neck: Trachea normal and normal range of motion. Neck supple. No tracheal tenderness, no spinous process tenderness and no muscular tenderness present. No rigidity. No tracheal deviation, no edema, no erythema and normal range of motion present. No thyroid mass and no thyromegaly present.  Cardiovascular: Normal rate, regular rhythm, S1 normal, S2 normal, normal heart sounds and intact distal pulses.  PMI is not displaced.  Exam reveals no gallop and no friction rub.   No murmur heard. Pulses:      Radial pulses are 2+ on the right side, and 2+ on the left side.  Pulmonary/Chest: Effort normal and breath sounds normal. No stridor. No respiratory distress. He has  no decreased breath sounds. He has no wheezes. He has no rhonchi. He has no rales.  Abdominal: He exhibits distension. He exhibits no shifting dullness, no pulsatile liver, no fluid wave, no abdominal bruit, no ascites, no pulsatile midline mass and no mass. Bowel sounds are absent. There is hepatosplenomegaly. There is generalized tenderness. There is no  rebound, no guarding, no CVA tenderness, no tenderness at McBurney's point and negative Murphy's sign. Hernia confirmed negative in the ventral area.  tympanny to percussion all quads; no bowel sounds auscultated; bloated slight resistance palpation  Musculoskeletal: Normal range of motion. He exhibits no edema or tenderness.       Right shoulder: Normal.       Left shoulder: Normal.       Right elbow: Normal.      Left elbow: Normal.       Right hip: Normal.       Left hip: Normal.       Right knee: Normal.       Left knee: Normal.       Right ankle: Normal.       Left ankle: Normal.       Cervical back: Normal.       Thoracic back: Normal.       Lumbar back: Normal.       Right hand: Normal.       Left hand: Normal.  Lymphadenopathy:       Head (right side): No submental, no submandibular, no tonsillar, no preauricular, no posterior auricular and no occipital adenopathy present.       Head (left side): No submental, no submandibular, no tonsillar, no preauricular, no posterior auricular and no occipital adenopathy present.    He has no cervical adenopathy.       Right cervical: No superficial cervical, no deep cervical and no posterior cervical adenopathy present.      Left cervical: No superficial cervical, no deep cervical and no posterior cervical adenopathy present.  Neurological: He is alert and oriented to person, place, and time. He displays no atrophy and no tremor. No cranial nerve deficit or sensory deficit. He exhibits normal muscle tone. He displays no seizure activity. Coordination and gait normal. GCS eye subscore is 4. GCS verbal subscore is 5. GCS motor subscore is 6.  Skin: Skin is warm, dry and intact. No abrasion, no bruising, no burn, no ecchymosis, no laceration, no lesion, no petechiae and no rash noted. He is not diaphoretic. No cyanosis or erythema. No pallor. Nails show no clubbing.  Psychiatric: He has a normal mood and affect. His speech is normal and behavior  is normal. Judgment and thought content normal. Cognition and memory are normal.  Nursing note and vitals reviewed.   ED Course  Procedures (including critical care time)  Labs Review Labs Reviewed  CBC WITH DIFFERENTIAL/PLATELET - Abnormal; Notable for the following:    RBC 6.16 (*)    Hemoglobin 18.9 (*)    HCT 54.4 (*)    Neutro Abs 7.4 (*)    Monocytes Absolute 1.2 (*)    All other components within normal limits  COMPREHENSIVE METABOLIC PANEL - Abnormal; Notable for the following:    Sodium 132 (*)    Potassium 3.0 (*)    Chloride 78 (*)    CO2 37 (*)    Glucose, Bld 225 (*)    BUN 35 (*)    Creatinine, Ser 2.57 (*)    Total Protein 8.2 (*)    Total  Bilirubin 1.4 (*)    GFR calc non Af Amer 24 (*)    GFR calc Af Amer 28 (*)    Anion gap 17 (*)    All other components within normal limits  OCCULT BLOOD X 1 CARD TO LAB, STOOL - Abnormal; Notable for the following:    Fecal Occult Bld POSITIVE (*)    All other components within normal limits   Orthostatic VS for the past 24 hrs:  BP- Lying Pulse- Lying BP- Sitting Pulse- Sitting BP- Standing at 0 minutes Pulse- Standing at 0 minutes  12/28/15 1009 134/64 mmHg 93 135/72 mmHg 91 138/79 mmHg 100     Imaging Review Dg Abd 1 View  12/28/2015  CLINICAL DATA:  Vomiting for 3 days. EXAM: ABDOMEN - 1 VIEW COMPARISON:  None. FINDINGS: Dilated small bowel loops are noted in the left abdomen and pelvis. Decompressed small bowel loops in the right lower quadrant. Findings compatible with moderate to high-grade partial small bowel obstruction. No free air organomegaly. Gallstone noted in the right upper quadrant. No acute bony abnormality. IMPRESSION: Findings compatible with moderate to high-grade partial small bowel obstruction Electronically Signed   By: Rolm Baptise M.D.   On: 12/28/2015 09:35   0915 hemoccult card collected from emesis patient brought in with him.  Mucous green/black dark clear liquid 571ml.  Discussed IV bolus  NS, labs CBC, CMP, hemoccult, repeat vitals/orthostatics, KUB. ? Obstruction, ulcer, gastroenteritis, gastritis, diverticulitis differential diagnoses  Probable transfer to ER/hospitalization based on presentation.  Spouse and patient verbalized understanding information/instructions agreed with plan of care and had no further questions at this time.  [id number] phenergan 25mg  IM x 1 administered by RN Janine Ores and peripheral IV left hand IV NS bolus initiated.  Patient sipping gatorade/ice as dry mouth.  Discussed + hemoccult on emesis and probable small bowel obstruction noted on KUB patient given copy of report.  Has hiccups.  Will collect urine if patient able to void.  Sitting up on gurney  A&Ox3 NAD gait sure and steady in hall to xray and back denied dizzyness.  Pending CBC, CMP results were drawn by RN Janine Ores and carried to lab.  Patient and spouse agreed with plan of care, verbalized understanding information/instructions and had no further questions at this time.  1009 orthostatics positive patient and spouse notified patient meets dehydration criteria continue plan of care as previously discussed  1030 patient still with hiccups, drowsy after phenergan administration, sipping gatorade.  NS bolus 82ml completed IV without edema/erythema/pain infusing without difficulty.  Serum lab results discussed with patient hypokalemia, hyponatremia, acute kidney injury, dehydration.  Recommended ambulance transport to [facility] ER.  Patient refused at this time.  Discussed risks and benefits of POV transport with patient and spouse.  Given copy of lab results.  Patient and spouse verbalized understanding information/instructions and agreed to go to ER but patient refused ambulance transport.  Probable hospitalization discussed with patient and spouse.  Report called to ER charge nurse Elenore Rota at [phone].  Asked patient and spouse to discuss transport while I called report to [facility] ER charge nurse.    1112 Patient  again refused ambulance transport insisted on POV transport by spouse to [facility] ER for further care and possible hospital admission. Discussed stop voltaren, cialis, viagra, and ambien until told he can restart by nephrology and zestoretic will probably need to be changed due to hydrochlorothiazide could worsen kidney function Avoid any OTC medications unless cleared by provider due to decreased GFR and  elevated creatinine/acute kidney injury/dehydration Patient assisted to car by staff in wheelchair.  Transferred ambulatory without difficulty in exam room from gurney to chair.  Patient A&Ox3.  Dressing left forearm/hand clean dry and intact from venipuncture and IV discontinuation after NS 1 liter bolus completed.   MDM   1. Generalized abdominal pain   2. Acute renal failure, unspecified acute renal failure type ([place])   3. Hematemesis with nausea   4. Hypokalemia due to loss of potassium   5. Hyponatremia    Discussed with patient probable NG tube insertion at hospital due to possible small bowel obstruction and admission due to acute kidney injury secondary to dehydration from vomiting. Once at home If vomiting hold po intake x 1 hour.  Then sips clear fluids like broths, ginger ale, power ade, gatorade, pedialyte may advance to soft/bland if no vomiting x 24 hours and appetite returned otherwise hydration main focus.  Will probably restart IV at hospital for IVF.  Discussed will probably have CT scan at [facility]--services were not available at [facility] today. Patient to follow up with [facility] after discharge from [facility].  Recommend re-evaluation after discharge home if worsening abdominal pain, unable to tolerate po intake/repetitive vomiting, blood in stool or vomit (red or black), fever greater than 100.5, unable to void every 8 hours or syncope.   Exitcare handout on small bowel obstruction, hypokalemia, hyponatremia, acute renal failure and abdominal pain/vomiting given to patient. Spouse and Patient verbalized  agreement and understanding of treatment plan and had no further questions at this time.  Discussed with patient diarrhea, vomiting, kidney disease, medications,low dietary intake can cause hypokalemia and hyponatremia.  Vomiting this past week.  Follow up with [facility] for repeat testing as acute kidney injury on today's visit Return to clinic or ER for re-evaluation if muscle cramps, palpitations weakness, nausea, vomiting, diarrhea, fatigue, syncope upon discharge home.  Exitcare handout on hypokalemia and hyponatremia given to patient.  Spouse and  Patient verbalized understanding of information/instructions, agreed with plan of care and had no further questions at this time.    Olen Cordial, NP 12/28/15 2142

## 2015-12-28 NOTE — ED Notes (Signed)
Updated Aaron Mitchell that patient was on the way up.

## 2015-12-28 NOTE — ED Notes (Signed)
Advanced NG tube.

## 2015-12-28 NOTE — ED Notes (Signed)
Pt transported to CT ?

## 2015-12-29 ENCOUNTER — Inpatient Hospital Stay: Payer: PPO

## 2015-12-29 DIAGNOSIS — K5660 Unspecified intestinal obstruction: Secondary | ICD-10-CM | POA: Diagnosis not present

## 2015-12-29 DIAGNOSIS — K529 Noninfective gastroenteritis and colitis, unspecified: Secondary | ICD-10-CM | POA: Diagnosis not present

## 2015-12-29 DIAGNOSIS — N179 Acute kidney failure, unspecified: Secondary | ICD-10-CM | POA: Diagnosis not present

## 2015-12-29 DIAGNOSIS — K567 Ileus, unspecified: Secondary | ICD-10-CM | POA: Diagnosis not present

## 2015-12-29 DIAGNOSIS — E876 Hypokalemia: Secondary | ICD-10-CM | POA: Diagnosis not present

## 2015-12-29 DIAGNOSIS — E871 Hypo-osmolality and hyponatremia: Secondary | ICD-10-CM | POA: Diagnosis not present

## 2015-12-29 LAB — BASIC METABOLIC PANEL
ANION GAP: 13 (ref 5–15)
BUN: 39 mg/dL — ABNORMAL HIGH (ref 6–20)
CO2: 38 mmol/L — AB (ref 22–32)
Calcium: 8.4 mg/dL — ABNORMAL LOW (ref 8.9–10.3)
Chloride: 84 mmol/L — ABNORMAL LOW (ref 101–111)
Creatinine, Ser: 2.08 mg/dL — ABNORMAL HIGH (ref 0.61–1.24)
GFR calc Af Amer: 36 mL/min — ABNORMAL LOW (ref >60)
GFR calc non Af Amer: 31 mL/min — ABNORMAL LOW (ref >60)
GLUCOSE: 140 mg/dL — AB (ref 65–99)
POTASSIUM: 2.9 mmol/L — AB (ref 3.5–5.1)
Sodium: 135 mmol/L (ref 135–145)

## 2015-12-29 LAB — CBC
HEMATOCRIT: 47.9 % (ref 40.0–52.0)
HEMOGLOBIN: 16.6 g/dL (ref 13.0–18.0)
MCH: 30.7 pg (ref 26.0–34.0)
MCHC: 34.7 g/dL (ref 32.0–36.0)
MCV: 88.6 fL (ref 80.0–100.0)
Platelets: 162 10*3/uL (ref 150–440)
RBC: 5.41 MIL/uL (ref 4.40–5.90)
RDW: 14.5 % (ref 11.5–14.5)
WBC: 7.4 10*3/uL (ref 3.8–10.6)

## 2015-12-29 LAB — PROTIME-INR
INR: 1.19
Prothrombin Time: 15.3 seconds — ABNORMAL HIGH (ref 11.4–15.0)

## 2015-12-29 LAB — MAGNESIUM: Magnesium: 1.5 mg/dL — ABNORMAL LOW (ref 1.7–2.4)

## 2015-12-29 MED ORDER — MAGNESIUM SULFATE 2 GM/50ML IV SOLN
2.0000 g | Freq: Once | INTRAVENOUS | Status: AC
  Administered 2015-12-29: 2 g via INTRAVENOUS
  Filled 2015-12-29: qty 50

## 2015-12-29 MED ORDER — POTASSIUM CHLORIDE IN NACL 20-0.9 MEQ/L-% IV SOLN
INTRAVENOUS | Status: DC
  Administered 2015-12-29 (×2): via INTRAVENOUS
  Filled 2015-12-29 (×6): qty 1000

## 2015-12-29 MED ORDER — POTASSIUM CHLORIDE CRYS ER 20 MEQ PO TBCR
20.0000 meq | EXTENDED_RELEASE_TABLET | Freq: Two times a day (BID) | ORAL | Status: DC
  Administered 2015-12-29 – 2015-12-30 (×3): 20 meq via ORAL
  Filled 2015-12-29 (×3): qty 1

## 2015-12-29 NOTE — Progress Notes (Signed)
Patient is tolerating clear liquid diet but is reluctant to remove nasogastric tube. He is worried about rebounding. He is passing gas We'll likely be able to remove NG tube later today

## 2015-12-29 NOTE — Progress Notes (Addendum)
Received critical lab value from the lab.  Patient's potassium is 2.9 per lab.  Will notify the patient's assigned RN. Dr. Verdell Carmine paged. Waiting on a return call.    Received a return call back from Dr. Verdell Carmine. Notified him of the critical potassium level and he acknowledged.  No new orders received at this time.

## 2015-12-29 NOTE — Consult Note (Signed)
Surgical Consultation  12/29/2015  Aaron Mitchell is an 70 y.o. male.   CC: Gastroenteritis  HPI: This a patient with extensive nausea and vomiting with no abdominal pain but distention he's been passing gas he is not had diarrhea but has had a normal bowel movement yesterday hospital with plain film findings suggestive of bowel obstruction but CT scan suggested an ileus or enteritis. He's had recent travel and eating in her a [employer]. The family is ill. He has had no dominant all surgery.  Past Medical History  Diagnosis Date  . Male hypogonadism   . Hypertension   . Gout   . Chronic back pain     L4 deformity  . Borderline diabetes mellitus   . Obesity   . Degenerative disc disease, lumbar   . Erectile dysfunction   . Seasonal allergies   . Shoulder bursitis   . Hyperlipidemia   . Achilles tendinitis     Left leg    Past Surgical History  Procedure Laterality Date  . Trigger finger release Right   . Carpal tunnel release Right 2015  . Tonsillectomy    . Rhinoplasty    . Adenoidectomy      Family History  Problem Relation Age of Onset  . Alcohol abuse Father   . Hypertension Father   . CVA Father 50    Social History:  reports that he quit smoking about 37 years ago. He does not have any smokeless tobacco history on file. He reports that he drinks alcohol. He reports that he does not use illicit drugs.  Allergies:  Allergies  Allergen Reactions  . Codeine Other (See Comments)    Other reaction(s): Other (See Comments) HEADACHES Causes headaches when he stops liquid Codeine-based products    Medications reviewed.   Review of Systems:   Review of Systems  Constitutional: Negative.   HENT: Negative.   Eyes: Negative.   Cardiovascular: Negative.   Gastrointestinal: Positive for nausea and vomiting. Negative for heartburn, abdominal pain, diarrhea, constipation, blood in stool and melena.  Genitourinary: Negative.   Musculoskeletal: Negative.    Skin: Negative.   Neurological: Negative.   Endo/Heme/Allergies: Negative.   Psychiatric/Behavioral: Negative.      Physical Exam:  BP 125/65 mmHg  Pulse 93  Temp(Src) 98 F (36.7 C) (Oral)  Resp 16  Ht 5\' 11"  (1.803 m)  Wt 280 lb (127.007 kg)  BMI 39.07 kg/m2  SpO2 92%  Physical Exam  Constitutional: He is oriented to person, place, and time and well-developed, well-nourished, and in no distress. No distress.  Obese male no acute distress  HENT:  Head: Normocephalic and atraumatic.  Eyes: Right eye exhibits no discharge. Left eye exhibits no discharge. No scleral icterus.  Pulmonary/Chest: Effort normal. No respiratory distress.  Abdominal: Soft. He exhibits distension. There is no tenderness. There is no rebound and no guarding.  Tympanitic but nontender  Neurological: He is alert and oriented to person, place, and time.  Skin: Skin is warm and dry. He is not diaphoretic.  Psychiatric: Mood and affect normal.  Vitals reviewed.     Results for orders placed or performed during the hospital encounter of 12/28/15 (from the past 48 hour(s))  Protime-INR     Status: Abnormal   Collection Time: 12/29/15  5:00 AM  Result Value Ref Range   Prothrombin Time 15.3 (H) 11.4 - 15.0 seconds   INR 1.19    Ct Abdomen Pelvis Wo Contrast  12/28/2015  CLINICAL DATA:  Diffuse abdominal  pain with nausea and vomiting for 3 days. Renal insufficiency. EXAM: CT ABDOMEN AND PELVIS WITHOUT CONTRAST TECHNIQUE: Multidetector CT imaging of the abdomen and pelvis was performed following the standard protocol without IV contrast. COMPARISON:  None. FINDINGS: Lower chest:  No acute findings. Hepatobiliary: No mass visualized on this un-enhanced exam. Pancreas: No mass or inflammatory process identified on this un-enhanced exam. Diffuse hepatic steatosis is noted. 1.5 cm calcified gallstone is seen without evidence of cholecystitis or biliary ductal dilatation. Spleen: Within normal limits in size.  Adrenals/Urinary Tract: No evidence of urolithiasis or hydronephrosis. Unopacified urinary bladder is unremarkable in appearance. Stomach/Bowel: Nasogastric tube tip is seen within the proximal stomach. Proximal small bowel shows mild dilatation with air-fluid levels. No significant bowel wall thickening or mesenteric inflammatory changes. No discrete transition point. No focal inflammatory process or abnormal fluid collections identified. Vascular/Lymphatic: No pathologically enlarged lymph nodes. No evidence of abdominal aortic aneurysm. Aortic atherosclerotic calcification noted. Reproductive: No mass or other significant abnormality. Other: None. Musculoskeletal:  No suspicious bone lesions identified. IMPRESSION: Mild dilatation of proximal small bowel loops containing air-fluid levels, without definite evidence of obstruction or focal inflammatory process. This is nonspecific but may be due to mild small bowel ileus/enteritis. Hepatic steatosis and cholelithiasis incidentally noted. No radiographic evidence of cholecystitis or biliary dilatation. Electronically Signed   By: Earle Gell M.D.   On: 12/28/2015 16:16   Dg Abd 1 View  12/28/2015  CLINICAL DATA:  Nasogastric tube placement EXAM: ABDOMEN - 1 VIEW COMPARISON:  CT abdomen and pelvis December 28, 2015 FINDINGS: Nasogastric tube tip and side port are in the stomach. Stomach is distended with air. There are multiple loops of mildly dilated bowel. No free air evident. Lung bases are clear. IMPRESSION: Nasogastric tube tip and side port in stomach. Stomach is distended with air. Loops of dilated small bowel persist. No free air. Lung bases clear. Electronically Signed   By: Lowella Grip III M.D.   On: 12/28/2015 19:50   Dg Abd 1 View  12/28/2015  CLINICAL DATA:  Followup abdominal pain.  Assess NG tube placement EXAM: ABDOMEN - 1 VIEW COMPARISON:  12/28/2015 FINDINGS: The nasogastric tube tip is in the distal esophagus approximately 5 cm above the GE  junction. Dilated loops of small bowel are again identified compatible with bowel obstruction. Gallstone is noted within the right upper quadrant of the abdomen. IMPRESSION: 1. The nasogastric tube is under advanced with tip above the GE junction. Electronically Signed   By: Kerby Moors M.D.   On: 12/28/2015 14:15   Dg Abd 1 View  12/28/2015  CLINICAL DATA:  Vomiting for 3 days. EXAM: ABDOMEN - 1 VIEW COMPARISON:  None. FINDINGS: Dilated small bowel loops are noted in the left abdomen and pelvis. Decompressed small bowel loops in the right lower quadrant. Findings compatible with moderate to high-grade partial small bowel obstruction. No free air organomegaly. Gallstone noted in the right upper quadrant. No acute bony abnormality. IMPRESSION: Findings compatible with moderate to high-grade partial small bowel obstruction Electronically Signed   By: Rolm Baptise M.D.   On: 12/28/2015 09:35    Assessment/Plan:  Probable gastroenteritis with vomiting resulting in acute kidney injury. He has been hydrated and labs are currently pending this morning. I will repeat a KUB today but I see no sign of mechanical small bowel obstruction and he's had no surgery to suggest adhesive disease.  Florene Glen, MD, FACS

## 2015-12-29 NOTE — Progress Notes (Signed)
[facility] NAME: Aaron Mitchell    MR#:  [id number]  DATE OF BIRTH:  12-14-1945  SUBJECTIVE:   Still having some upper abdominal bloating/distention. No diarrhea. No nausea/vomiting. KUB this morning showing improvement in the gaseous distention.  REVIEW OF SYSTEMS:    Review of Systems  Constitutional: Negative for fever and chills.  HENT: Negative for congestion and tinnitus.   Eyes: Negative for blurred vision and double vision.  Respiratory: Negative for cough, shortness of breath and wheezing.   Cardiovascular: Negative for chest pain, orthopnea and PND.  Gastrointestinal: Negative for nausea, vomiting, abdominal pain and diarrhea.  Genitourinary: Negative for dysuria and hematuria.  Neurological: Negative for dizziness, sensory change and focal weakness.  All other systems reviewed and are negative.   Nutrition: Clear liquids Tolerating Diet: yes Tolerating PT: Await eval.    DRUG ALLERGIES:   Allergies  Allergen Reactions  . Codeine Other (See Comments)    Other reaction(s): Other (See Comments) HEADACHES Causes headaches when he stops liquid Codeine-based products    VITALS:  Blood pressure 125/65, pulse 93, temperature 98 F (36.7 C), temperature source Oral, resp. rate 16, height 5\' 11"  (1.803 m), weight 127.007 kg (280 lb), SpO2 92 %.  PHYSICAL EXAMINATION:   Physical Exam  GENERAL:  70 y.o.-year-old obese patient lying in the bed in no acute distress.  EYES: Pupils equal, round, reactive to light and accommodation. No scleral icterus. Extraocular muscles intact.  HEENT: Head atraumatic, normocephalic. Oropharynx and nasopharynx clear.  NECK:  Supple, no jugular venous distention. No thyroid enlargement, no tenderness.  LUNGS: Normal breath sounds bilaterally, no wheezing, rales, rhonchi. No use of accessory muscles of respiration.  CARDIOVASCULAR: S1, S2 normal. No murmurs, rubs, or gallops.  ABDOMEN:  Soft, nontender, slightly distended. Hypoactive Bowel sounds. No organomegaly or mass.  EXTREMITIES: No cyanosis, clubbing or edema b/l.    NEUROLOGIC: Cranial nerves II through XII are intact. No focal Motor or sensory deficits b/l.   PSYCHIATRIC: The patient is alert and oriented x 3.  SKIN: No obvious rash, lesion, or ulcer.    LABORATORY PANEL:   CBC  Recent Labs Lab 12/29/15 0500  WBC 7.4  HGB 16.6  HCT 47.9  PLT 162   ------------------------------------------------------------------------------------------------------------------  Chemistries   Recent Labs Lab 12/28/15 0918 12/29/15 0500  NA 132* 135  K 3.0* 2.9*  CL 78* 84*  CO2 37* 38*  GLUCOSE 225* 140*  BUN 35* 39*  CREATININE 2.57* 2.08*  CALCIUM 10.0 8.4*  MG  --  1.5*  AST 31  --   ALT 25  --   ALKPHOS 52  --   BILITOT 1.4*  --    ------------------------------------------------------------------------------------------------------------------  Cardiac Enzymes No results for input(s): TROPONINI in the last 168 hours. ------------------------------------------------------------------------------------------------------------------  RADIOLOGY:  Ct Abdomen Pelvis Wo Contrast  12/28/2015  CLINICAL DATA:  Diffuse abdominal pain with nausea and vomiting for 3 days. Renal insufficiency. EXAM: CT ABDOMEN AND PELVIS WITHOUT CONTRAST TECHNIQUE: Multidetector CT imaging of the abdomen and pelvis was performed following the standard protocol without IV contrast. COMPARISON:  None. FINDINGS: Lower chest:  No acute findings. Hepatobiliary: No mass visualized on this un-enhanced exam. Pancreas: No mass or inflammatory process identified on this un-enhanced exam. Diffuse hepatic steatosis is noted. 1.5 cm calcified gallstone is seen without evidence of cholecystitis or biliary ductal dilatation. Spleen: Within normal limits in size. Adrenals/Urinary Tract: No evidence of urolithiasis or hydronephrosis. Unopacified  urinary bladder  is unremarkable in appearance. Stomach/Bowel: Nasogastric tube tip is seen within the proximal stomach. Proximal small bowel shows mild dilatation with air-fluid levels. No significant bowel wall thickening or mesenteric inflammatory changes. No discrete transition point. No focal inflammatory process or abnormal fluid collections identified. Vascular/Lymphatic: No pathologically enlarged lymph nodes. No evidence of abdominal aortic aneurysm. Aortic atherosclerotic calcification noted. Reproductive: No mass or other significant abnormality. Other: None. Musculoskeletal:  No suspicious bone lesions identified. IMPRESSION: Mild dilatation of proximal small bowel loops containing air-fluid levels, without definite evidence of obstruction or focal inflammatory process. This is nonspecific but may be due to mild small bowel ileus/enteritis. Hepatic steatosis and cholelithiasis incidentally noted. No radiographic evidence of cholecystitis or biliary dilatation. Electronically Signed   By: Earle Gell M.D.   On: 12/28/2015 16:16   Dg Abd 1 View  12/28/2015  CLINICAL DATA:  Nasogastric tube placement EXAM: ABDOMEN - 1 VIEW COMPARISON:  CT abdomen and pelvis December 28, 2015 FINDINGS: Nasogastric tube tip and side port are in the stomach. Stomach is distended with air. There are multiple loops of mildly dilated bowel. No free air evident. Lung bases are clear. IMPRESSION: Nasogastric tube tip and side port in stomach. Stomach is distended with air. Loops of dilated small bowel persist. No free air. Lung bases clear. Electronically Signed   By: Lowella Grip III M.D.   On: 12/28/2015 19:50   Dg Abd 1 View  12/28/2015  CLINICAL DATA:  Followup abdominal pain.  Assess NG tube placement EXAM: ABDOMEN - 1 VIEW COMPARISON:  12/28/2015 FINDINGS: The nasogastric tube tip is in the distal esophagus approximately 5 cm above the GE junction. Dilated loops of small bowel are again identified compatible with bowel  obstruction. Gallstone is noted within the right upper quadrant of the abdomen. IMPRESSION: 1. The nasogastric tube is under advanced with tip above the GE junction. Electronically Signed   By: Kerby Moors M.D.   On: 12/28/2015 14:15   Dg Abd 1 View  12/28/2015  CLINICAL DATA:  Vomiting for 3 days. EXAM: ABDOMEN - 1 VIEW COMPARISON:  None. FINDINGS: Dilated small bowel loops are noted in the left abdomen and pelvis. Decompressed small bowel loops in the right lower quadrant. Findings compatible with moderate to high-grade partial small bowel obstruction. No free air organomegaly. Gallstone noted in the right upper quadrant. No acute bony abnormality. IMPRESSION: Findings compatible with moderate to high-grade partial small bowel obstruction Electronically Signed   By: Rolm Baptise M.D.   On: 12/28/2015 09:35   Dg Abd 2 Views  12/29/2015  CLINICAL DATA:  Follow-up small bowel obstruction. EXAM: ABDOMEN - 2 VIEW COMPARISON:  12/28/2015 FINDINGS: An NG tube with tip overlying the mid stomach again identified. Distended small bowel loops within the abdomen are again noted with decreased in number since the prior study. There is no evidence of pneumoperitoneum. No suspicious calcifications are identified. IMPRESSION: Distended small bowel loops again identified but decreased in number. No other significant change.  No evidence of pneumoperitoneum. Electronically Signed   By: Margarette Canada M.D.   On: 12/29/2015 10:17     ASSESSMENT AND PLAN:   70 y.o. male with a known history of Hypertension, obesity, hypogonadism, borderline diabetes mellitus, arthritis presents to the hospital secondary to 2-3 day history of worsening nausea and vomiting with abdominal discomfort.  #1 acute gastroenteritis- suspected given clinical symptoms of N/V and diarrhea. CT Scan showing non-specific enteritis. - cont. empiric IV Cipro and Flagyl. - cont. Supportive care  w/ IV fluids, pain control, anti-emetics. - appreciate  surgical input and NG tube clamped today and started on clear liquid diet.    #2 ileus versus partial small bowel obstruction-CT scan more suggestive of ileus.  - appreciate Surgical consult and no acute indication for surgery.  -KUB this morning showing improvement and gaseous distention. - cont. Supportive care with IV fluids, antiemetics. NG tube clamped. Started on clear liquid diet.  #3 acute renal failure-likely prerenal in nature and due to volume loss. - improving w/ IV fluids and will monitor.   #4 hypogonadism-follows with urology. On testosterone injections weekly.  #5-hypertension-hold blood pressure medicine as he is normotensive for now. Patient was taking lisinopril hydrochlorothiazide at home.  #6 hypokalemia- will cont. To supplement and repeat level in a.m.  - Mg level low and will give Mg. Sulfate.   #7Osteoarthritis - cont. Diclofenac, Skelaxin.     All the records are reviewed and case discussed with Care Management/Social Workerr. Management plans discussed with the patient, family and they are in agreement.  CODE STATUS: Full  DVT Prophylaxis: Lovenox  TOTAL TIME TAKING CARE OF THIS PATIENT: 30 minutes.   POSSIBLE D/C IN 2-3 DAYS, DEPENDING ON CLINICAL CONDITION.   Henreitta Leber M.D on 12/29/2015 at 12:33 PM  Between 7am to 6pm - Pager - [phone]  After 6pm go to [email or web address] - password [email or web address] [facility]  Office  [phone]  CC: Primary care physician; Dion Body, MD

## 2015-12-30 DIAGNOSIS — K567 Ileus, unspecified: Secondary | ICD-10-CM | POA: Diagnosis not present

## 2015-12-30 DIAGNOSIS — E876 Hypokalemia: Secondary | ICD-10-CM | POA: Diagnosis not present

## 2015-12-30 DIAGNOSIS — E871 Hypo-osmolality and hyponatremia: Secondary | ICD-10-CM | POA: Diagnosis not present

## 2015-12-30 DIAGNOSIS — K529 Noninfective gastroenteritis and colitis, unspecified: Secondary | ICD-10-CM | POA: Diagnosis not present

## 2015-12-30 LAB — BASIC METABOLIC PANEL
ANION GAP: 9 (ref 5–15)
ANION GAP: 10 (ref 5–15)
BUN: 28 mg/dL — ABNORMAL HIGH (ref 6–20)
BUN: 21 mg/dL — ABNORMAL HIGH (ref 6–20)
CALCIUM: 7.7 mg/dL — AB (ref 8.9–10.3)
CO2: 32 mmol/L (ref 22–32)
CO2: 28 mmol/L (ref 22–32)
Calcium: 7.7 mg/dL — ABNORMAL LOW (ref 8.9–10.3)
Chloride: 92 mmol/L — ABNORMAL LOW (ref 101–111)
Chloride: 93 mmol/L — ABNORMAL LOW (ref 101–111)
Creatinine, Ser: 1.42 mg/dL — ABNORMAL HIGH (ref 0.61–1.24)
Creatinine, Ser: 1.38 mg/dL — ABNORMAL HIGH (ref 0.61–1.24)
GFR calc Af Amer: 60 mL/min — ABNORMAL LOW (ref >60)
GFR, EST AFRICAN AMERICAN: 58 mL/min — AB (ref >60)
GFR, EST NON AFRICAN AMERICAN: 50 mL/min — AB (ref >60)
GFR, EST NON AFRICAN AMERICAN: 51 mL/min — AB (ref >60)
Glucose, Bld: 136 mg/dL — ABNORMAL HIGH (ref 65–99)
Glucose, Bld: 154 mg/dL — ABNORMAL HIGH (ref 65–99)
POTASSIUM: 3 mmol/L — AB (ref 3.5–5.1)
POTASSIUM: 3.1 mmol/L — AB (ref 3.5–5.1)
SODIUM: 133 mmol/L — AB (ref 135–145)
SODIUM: 131 mmol/L — AB (ref 135–145)

## 2015-12-30 MED ORDER — MAGNESIUM SULFATE 2 GM/50ML IV SOLN
2.0000 g | Freq: Once | INTRAVENOUS | Status: AC
  Administered 2015-12-30: 2 g via INTRAVENOUS
  Filled 2015-12-30: qty 50

## 2015-12-30 NOTE — Discharge Summary (Signed)
[facility] [place] NAME: Aaron Mitchell    MR#:  [id number]  DATE OF BIRTH:  Sep 11, 1945  DATE OF ADMISSION:  12/28/2015 ADMITTING PHYSICIAN: Gladstone Lighter, MD  DATE OF DISCHARGE: 12/30/2015  PRIMARY CARE PHYSICIAN: Dion Body, MD    ADMISSION DIAGNOSIS:  SBO (small bowel obstruction) (HCC) [K56.69] Acute gastrointestinal bleeding [K92.2] Acute renal failure, unspecified acute renal failure type ([place]) [N17.9]  DISCHARGE DIAGNOSIS:  Active Problems:   ARF (acute renal failure) (HCC)   Acute renal failure (HCC)   Ileus (HCC)   SECONDARY DIAGNOSIS:   Past Medical History  Diagnosis Date  . Male hypogonadism   . Hypertension   . Gout   . Chronic back pain     L4 deformity  . Borderline diabetes mellitus   . Obesity   . Degenerative disc disease, lumbar   . Erectile dysfunction   . Seasonal allergies   . Shoulder bursitis   . Hyperlipidemia   . Achilles tendinitis     Left leg    HOSPITAL COURSE:   70 y.o. male with a known history of Hypertension, obesity, hypogonadism, borderline diabetes mellitus, arthritis presents to the hospital secondary to 2-3 day history of worsening nausea and vomiting with abdominal discomfort.  #1 acute gastroenteritis- This was a suspected diagnosis given clinical symptoms of N/V and diarrhea. CT Scan showing non-specific enteritis. This was Likely viral in nature and self-limiting. -We'll on the hospital patient was empirically given ciprofloxacin and Flagyl but is not currently not being discharged on antibiotics. He has had no further nausea vomiting or diarrhea while in the hospital. He is tolerating a regular diet well and therefore being discharged home.  #2 ileus versus partial small bowel obstruction-CT scan more suggestive of ileus.  -This was ileus secondary to underlying gastroenteritis. Initially patient is to have an NG tube for decompression which did help him. He was seen  by surgery who did not recommend any acute surgical intervention. The NG tube was eventually clamped and he was given a clear liquid diet which he tolerated and shortly thereafter the NG tube was removed. -He is now tolerating a regular diet without any worsening nausea vomiting abdominal distention and therefore being discharged home.  #3 acute renal failure-likely prerenal in nature and due to volume loss. - This has significantly improved with IV fluid hydration and can be further followed as an outpatient.  #4 hypogonadism-he follows with urology. On testosterone injections weekly.  #5-hypertension-she is antihypertensive meds were held due to his I abnormalities and dehydration. -He will resume them in the next few days after discharge.  #6 hypokalemia/hypomagnesemia-this has been supplemented and improved and can be further followed as an outpatient.  #7Osteoarthritis - he will cont. Diclofenac, Skelaxin.   DISCHARGE CONDITIONS:   Stable  CONSULTS OBTAINED:  Treatment Team:  Florene Glen, MD  DRUG ALLERGIES:   Allergies  Allergen Reactions  . Codeine Other (See Comments)    Other reaction(s): Other (See Comments) HEADACHES Causes headaches when he stops liquid Codeine-based products    DISCHARGE MEDICATIONS:   Current Discharge Medication List    CONTINUE these medications which have NOT CHANGED   Details  allopurinol (ZYLOPRIM) 300 MG tablet Take 1 tablet (300 mg total) by mouth daily. Qty: 90 tablet, Refills: 1    amLODipine (NORVASC) 5 MG tablet Take 1 tablet by mouth  twice a day Qty: 180 tablet, Refills: 1    diclofenac (VOLTAREN) 75 MG EC tablet  Take 75 mg by mouth 2 (two) times daily as needed. For pain.    diclofenac sodium (VOLTAREN) 1 % GEL Apply 2 g topically daily as needed. Apply to both hands.    fluticasone (FLONASE) 50 MCG/ACT nasal spray Place 2 sprays into both nostrils daily. Qty: 16 g, Refills: 0    levocetirizine (XYZAL) 5 MG tablet  Take 1 tablet (5 mg total) by mouth every evening. Qty: 30 tablet, Refills: 0    lisinopril-hydrochlorothiazide (PRINZIDE,ZESTORETIC) 20-12.5 MG tablet Take 1 tablet by mouth daily. Qty: 30 tablet, Refills: 0    metaxalone (SKELAXIN) 800 MG tablet Take 800 mg by mouth 3 (three) times daily as needed. For muscle RELAXER.    Multiple Vitamins tablet Take 1 tablet by mouth daily.    testosterone cypionate (DEPOTESTOSTERONE CYPIONATE) 200 MG/ML injection Inject 100 mg into the muscle every 7 (seven) days. Use on Friday.    zolpidem (AMBIEN) 10 MG tablet Take 10 mg by mouth at bedtime as needed. For sleep.         DISCHARGE INSTRUCTIONS:   DIET:  Cardiac diet  DISCHARGE CONDITION:  Stable  ACTIVITY:  Activity as tolerated  OXYGEN:  Home Oxygen: No.   Oxygen Delivery: room air  DISCHARGE LOCATION:  home   If you experience worsening of your admission symptoms, develop shortness of breath, life threatening emergency, suicidal or homicidal thoughts you must seek medical attention immediately by calling 911 or calling your MD immediately  if symptoms less severe.  You Must read complete instructions/literature along with all the possible adverse reactions/side effects for all the Medicines you take and that have been prescribed to you. Take any new Medicines after you have completely understood and accpet all the possible adverse reactions/side effects.   Please note  You were cared for by a hospitalist during your hospital stay. If you have any questions about your discharge medications or the care you received while you were in the hospital after you are discharged, you can call the unit and asked to speak with the hospitalist on call if the hospitalist that took care of you is not available. Once you are discharged, your primary care physician will handle any further medical issues. Please note that NO REFILLS for any discharge medications will be authorized once you are  discharged, as it is imperative that you return to your primary care physician (or establish a relationship with a primary care physician if you do not have one) for your aftercare needs so that they can reassess your need for medications and monitor your lab values.   DATA REVIEW:   CBC  Recent Labs Lab 12/29/15 0500  WBC 7.4  HGB 16.6  HCT 47.9  PLT 162    Chemistries   Recent Labs Lab 12/28/15 0918 12/29/15 0500  12/30/15 1459  NA 132* 135  < > 131*  K 3.0* 2.9*  < > 3.1*  CL 78* 84*  < > 93*  CO2 37* 38*  < > 28  GLUCOSE 225* 140*  < > 154*  BUN 35* 39*  < > 21*  CREATININE 2.57* 2.08*  < > 1.38*  CALCIUM 10.0 8.4*  < > 7.7*  MG  --  1.5*  --   --   AST 31  --   --   --   ALT 25  --   --   --   ALKPHOS 52  --   --   --   BILITOT 1.4*  --   --   --   < > =  values in this interval not displayed.  Cardiac Enzymes No results for input(s): TROPONINI in the last 168 hours.  Microbiology Results  No results found for this or any previous visit.  RADIOLOGY:  Dg Abd 1 View  12/28/2015  CLINICAL DATA:  Nasogastric tube placement EXAM: ABDOMEN - 1 VIEW COMPARISON:  CT abdomen and pelvis December 28, 2015 FINDINGS: Nasogastric tube tip and side port are in the stomach. Stomach is distended with air. There are multiple loops of mildly dilated bowel. No free air evident. Lung bases are clear. IMPRESSION: Nasogastric tube tip and side port in stomach. Stomach is distended with air. Loops of dilated small bowel persist. No free air. Lung bases clear. Electronically Signed   By: Lowella Grip III M.D.   On: 12/28/2015 19:50   Dg Abd 2 Views  12/29/2015  CLINICAL DATA:  Follow-up small bowel obstruction. EXAM: ABDOMEN - 2 VIEW COMPARISON:  12/28/2015 FINDINGS: An NG tube with tip overlying the mid stomach again identified. Distended small bowel loops within the abdomen are again noted with decreased in number since the prior study. There is no evidence of pneumoperitoneum. No  suspicious calcifications are identified. IMPRESSION: Distended small bowel loops again identified but decreased in number. No other significant change.  No evidence of pneumoperitoneum. Electronically Signed   By: Margarette Canada M.D.   On: 12/29/2015 10:17      Management plans discussed with the patient, family and they are in agreement.  CODE STATUS:     Code Status Orders        Start     Ordered   12/28/15 2009  Full code   Continuous     12/28/15 2008    Code Status History    Date Active Date Inactive Code Status Order ID Comments User Context   This patient has a current code status but no historical code status.    Advance Directive Documentation        Most Recent Value   Type of Advance Directive  Healthcare Power of Attorney, Living will   Pre-existing out of facility DNR order (yellow form or pink MOST form)     "MOST" Form in Place?        TOTAL TIME TAKING CARE OF THIS PATIENT: 40 minutes.    Henreitta Leber M.D on 12/30/2015 at 4:27 PM  Between 7am to 6pm - Pager - [phone]  After 6pm go to [email or web address] - password [email or web address] [facility]  Office  [phone]  CC: Primary care physician; Dion Body, MD

## 2015-12-30 NOTE — Progress Notes (Signed)
[facility] [place] NAME: Aaron Mitchell    MR#:  [id number]  DATE OF BIRTH:  06/17/1946  SUBJECTIVE:   Abdominal pain/distension much improved and resolved. Tolerating clear liquids well and wants to try to eat regular food.  No N/V or diarrhea overnight.   REVIEW OF SYSTEMS:    Review of Systems  Constitutional: Negative for fever and chills.  HENT: Negative for congestion and tinnitus.   Eyes: Negative for blurred vision and double vision.  Respiratory: Negative for cough, shortness of breath and wheezing.   Cardiovascular: Negative for chest pain, orthopnea and PND.  Gastrointestinal: Negative for nausea, vomiting, abdominal pain and diarrhea.  Genitourinary: Negative for dysuria and hematuria.  Neurological: Negative for dizziness, sensory change and focal weakness.  All other systems reviewed and are negative.   Nutrition: Clear liquids Tolerating Diet: yes Tolerating PT: ambulatory   DRUG ALLERGIES:   Allergies  Allergen Reactions  . Codeine Other (See Comments)    Other reaction(s): Other (See Comments) HEADACHES Causes headaches when he stops liquid Codeine-based products    VITALS:  Blood pressure 142/86, pulse 80, temperature 98.1 F (36.7 C), temperature source Oral, resp. rate 17, height 5\' 11"  (1.803 m), weight 127.007 kg (280 lb), SpO2 98 %.  PHYSICAL EXAMINATION:   Physical Exam  GENERAL:  70 y.o.-year-old obese patient lying in the bed in no acute distress.  EYES: Pupils equal, round, reactive to light and accommodation. No scleral icterus. Extraocular muscles intact.  HEENT: Head atraumatic, normocephalic. Oropharynx and nasopharynx clear.  NECK:  Supple, no jugular venous distention. No thyroid enlargement, no tenderness.  LUNGS: Normal breath sounds bilaterally, no wheezing, rales, rhonchi. No use of accessory muscles of respiration.  CARDIOVASCULAR: S1, S2 normal. No murmurs, rubs, or gallops.   ABDOMEN: Soft, nontender, nondistended. + Bowel sounds. No organomegaly or mass.  EXTREMITIES: No cyanosis, clubbing or edema b/l.    NEUROLOGIC: Cranial nerves II through XII are intact. No focal Motor or sensory deficits b/l.   PSYCHIATRIC: The patient is alert and oriented x 3.  SKIN: No obvious rash, lesion, or ulcer.    LABORATORY PANEL:   CBC  Recent Labs Lab 12/29/15 0500  WBC 7.4  HGB 16.6  HCT 47.9  PLT 162   ------------------------------------------------------------------------------------------------------------------  Chemistries   Recent Labs Lab 12/28/15 0918 12/29/15 0500 12/30/15 0630  NA 132* 135 133*  K 3.0* 2.9* 3.0*  CL 78* 84* 92*  CO2 37* 38* 32  GLUCOSE 225* 140* 136*  BUN 35* 39* 28*  CREATININE 2.57* 2.08* 1.42*  CALCIUM 10.0 8.4* 7.7*  MG  --  1.5*  --   AST 31  --   --   ALT 25  --   --   ALKPHOS 52  --   --   BILITOT 1.4*  --   --    ------------------------------------------------------------------------------------------------------------------  Cardiac Enzymes No results for input(s): TROPONINI in the last 168 hours. ------------------------------------------------------------------------------------------------------------------  RADIOLOGY:  Ct Abdomen Pelvis Wo Contrast  12/28/2015  CLINICAL DATA:  Diffuse abdominal pain with nausea and vomiting for 3 days. Renal insufficiency. EXAM: CT ABDOMEN AND PELVIS WITHOUT CONTRAST TECHNIQUE: Multidetector CT imaging of the abdomen and pelvis was performed following the standard protocol without IV contrast. COMPARISON:  None. FINDINGS: Lower chest:  No acute findings. Hepatobiliary: No mass visualized on this un-enhanced exam. Pancreas: No mass or inflammatory process identified on this un-enhanced exam. Diffuse hepatic steatosis is noted. 1.5 cm calcified gallstone  is seen without evidence of cholecystitis or biliary ductal dilatation. Spleen: Within normal limits in size. Adrenals/Urinary  Tract: No evidence of urolithiasis or hydronephrosis. Unopacified urinary bladder is unremarkable in appearance. Stomach/Bowel: Nasogastric tube tip is seen within the proximal stomach. Proximal small bowel shows mild dilatation with air-fluid levels. No significant bowel wall thickening or mesenteric inflammatory changes. No discrete transition point. No focal inflammatory process or abnormal fluid collections identified. Vascular/Lymphatic: No pathologically enlarged lymph nodes. No evidence of abdominal aortic aneurysm. Aortic atherosclerotic calcification noted. Reproductive: No mass or other significant abnormality. Other: None. Musculoskeletal:  No suspicious bone lesions identified. IMPRESSION: Mild dilatation of proximal small bowel loops containing air-fluid levels, without definite evidence of obstruction or focal inflammatory process. This is nonspecific but may be due to mild small bowel ileus/enteritis. Hepatic steatosis and cholelithiasis incidentally noted. No radiographic evidence of cholecystitis or biliary dilatation. Electronically Signed   By: Earle Gell M.D.   On: 12/28/2015 16:16   Dg Abd 1 View  12/28/2015  CLINICAL DATA:  Nasogastric tube placement EXAM: ABDOMEN - 1 VIEW COMPARISON:  CT abdomen and pelvis December 28, 2015 FINDINGS: Nasogastric tube tip and side port are in the stomach. Stomach is distended with air. There are multiple loops of mildly dilated bowel. No free air evident. Lung bases are clear. IMPRESSION: Nasogastric tube tip and side port in stomach. Stomach is distended with air. Loops of dilated small bowel persist. No free air. Lung bases clear. Electronically Signed   By: Lowella Grip III M.D.   On: 12/28/2015 19:50   Dg Abd 2 Views  12/29/2015  CLINICAL DATA:  Follow-up small bowel obstruction. EXAM: ABDOMEN - 2 VIEW COMPARISON:  12/28/2015 FINDINGS: An NG tube with tip overlying the mid stomach again identified. Distended small bowel loops within the abdomen are  again noted with decreased in number since the prior study. There is no evidence of pneumoperitoneum. No suspicious calcifications are identified. IMPRESSION: Distended small bowel loops again identified but decreased in number. No other significant change.  No evidence of pneumoperitoneum. Electronically Signed   By: Margarette Canada M.D.   On: 12/29/2015 10:17     ASSESSMENT AND PLAN:   70 y.o. male with a known history of Hypertension, obesity, hypogonadism, borderline diabetes mellitus, arthritis presents to the hospital secondary to 2-3 day history of worsening nausea and vomiting with abdominal discomfort.  #1 acute gastroenteritis- suspected given clinical symptoms of N/V and diarrhea. CT Scan showing non-specific enteritis.  Likely viral in nature and self-limiting. - will d/c Cipro/Flagyl.   - improved w/ supportive care is IV fluids, anti-emetics.  - NG tube has been removed. An tolerating clear liquids well.  #2 ileus versus partial small bowel obstruction-CT scan more suggestive of ileus.  -Much improved since yesterday. NG tube has been removed. Tolerating a clear liquid diet. -No acute need for surgical intervention. We'll advance diet today as tolerated  #3 acute renal failure-likely prerenal in nature and due to volume loss. - improving w/ IV fluids and will monitor.   #4 hypogonadism-follows with urology. On testosterone injections weekly.  #5-hypertension-hold blood pressure medicine as he is normotensive for now.  -Patient can resume his antihypertensives upon discharge.  #6 hypokalemia/hypomagnesemia-we'll replace both potassium and magnesium and follow levels. They are improving.  #7Osteoarthritis - cont. Diclofenac, Skelaxin.    Possible discharge later today if his electrolytes have improved as he has clinically improved.  All the records are reviewed and case discussed with Care Management/Social Workerr. Management  plans discussed with the patient, family and they  are in agreement.  CODE STATUS: Full  DVT Prophylaxis: Lovenox  TOTAL TIME TAKING CARE OF THIS PATIENT: 30 minutes.   POSSIBLE D/C IN 1-2 DAYS, DEPENDING ON CLINICAL CONDITION.   Henreitta Leber M.D on 12/30/2015 at 3:46 PM  Between 7am to 6pm - Pager - [phone]  After 6pm go to [email or web address] - password [email or web address] [facility]  Office  [phone]  CC: Primary care physician; Dion Body, MD

## 2015-12-30 NOTE — Consult Note (Signed)
   [facility] CM Inpatient Consult   12/30/2015  Joriel Dichter March 11, 1946 [id number]   Patient screened for potential [facility] Management services. Patient is eligible for [facility]. Electronic medical record reveals there were no identifiable [facility] care management needs at this time. [facility] Care Management services not appropriate at this time. If patient's post hospital needs change please place a [facility] Care Management consult. For questions please contact:   Bryson Gavia RN, [facility] Liaison  [phone]) Business Mobile [phone]) Toll free office

## 2015-12-31 NOTE — Progress Notes (Signed)
12/30/2015 18:30  [name] to be D/C'd Home per MD order.  Discussed prescriptions and follow up appointments with the patient. Prescriptions given to patient, medication list explained in detail. Pt verbalized understanding.    Medication List    TAKE these medications        allopurinol 300 MG tablet  Commonly known as:  ZYLOPRIM  Take 1 tablet (300 mg total) by mouth daily.     amLODipine 5 MG tablet  Commonly known as:  NORVASC  Take 1 tablet by mouth  twice a day     diclofenac 75 MG EC tablet  Commonly known as:  VOLTAREN  Take 75 mg by mouth 2 (two) times daily as needed. For pain.     diclofenac sodium 1 % Gel  Commonly known as:  VOLTAREN  Apply 2 g topically daily as needed. Apply to both hands.     fluticasone 50 MCG/ACT nasal spray  Commonly known as:  FLONASE  Place 2 sprays into both nostrils daily.     levocetirizine 5 MG tablet  Commonly known as:  XYZAL  Take 1 tablet (5 mg total) by mouth every evening.     lisinopril-hydrochlorothiazide 20-12.5 MG tablet  Commonly known as:  PRINZIDE,ZESTORETIC  Take 1 tablet by mouth daily.     metaxalone 800 MG tablet  Commonly known as:  SKELAXIN  Take 800 mg by mouth 3 (three) times daily as needed. For muscle RELAXER.     Multiple Vitamins tablet  Take 1 tablet by mouth daily.     testosterone cypionate 200 MG/ML injection  Commonly known as:  DEPOTESTOSTERONE CYPIONATE  Inject 100 mg into the muscle every 7 (seven) days. Use on Friday.     zolpidem 10 MG tablet  Commonly known as:  AMBIEN  Take 10 mg by mouth at bedtime as needed. For sleep.        Filed Vitals:   12/30/15 0505 12/30/15 1348  BP: 148/71 142/86  Pulse: 82 80  Temp: 98.1 F (36.7 C)   Resp: 20 17    Skin clean, dry and intact without evidence of skin break down, no evidence of skin tears noted. IV catheter discontinued intact. Site without signs and symptoms of complications. Dressing and pressure applied. Pt denies pain at  this time. No complaints noted.  An After Visit Summary was printed and given to the patient. Patient escorted via [place], and D/C home via private auto.  Aaron Mitchell

## 2016-01-03 DIAGNOSIS — H31091 Other chorioretinal scars, right eye: Secondary | ICD-10-CM | POA: Diagnosis not present

## 2016-01-03 DIAGNOSIS — E119 Type 2 diabetes mellitus without complications: Secondary | ICD-10-CM | POA: Diagnosis not present

## 2016-01-03 DIAGNOSIS — H43393 Other vitreous opacities, bilateral: Secondary | ICD-10-CM | POA: Diagnosis not present

## 2016-01-03 DIAGNOSIS — H2513 Age-related nuclear cataract, bilateral: Secondary | ICD-10-CM | POA: Diagnosis not present

## 2016-01-04 LAB — HM DIABETES EYE EXAM

## 2016-01-29 DIAGNOSIS — K5660 Unspecified intestinal obstruction: Secondary | ICD-10-CM | POA: Diagnosis not present

## 2016-02-12 DIAGNOSIS — Z79899 Other long term (current) drug therapy: Secondary | ICD-10-CM | POA: Diagnosis not present

## 2016-02-12 DIAGNOSIS — E291 Testicular hypofunction: Secondary | ICD-10-CM | POA: Diagnosis not present

## 2016-02-12 DIAGNOSIS — N138 Other obstructive and reflux uropathy: Secondary | ICD-10-CM | POA: Diagnosis not present

## 2016-02-12 DIAGNOSIS — N529 Male erectile dysfunction, unspecified: Secondary | ICD-10-CM | POA: Diagnosis not present

## 2016-02-12 DIAGNOSIS — N401 Enlarged prostate with lower urinary tract symptoms: Secondary | ICD-10-CM | POA: Diagnosis not present

## 2016-03-04 DIAGNOSIS — M5126 Other intervertebral disc displacement, lumbar region: Secondary | ICD-10-CM | POA: Diagnosis not present

## 2016-03-04 DIAGNOSIS — M5416 Radiculopathy, lumbar region: Secondary | ICD-10-CM | POA: Diagnosis not present

## 2016-03-04 DIAGNOSIS — M4806 Spinal stenosis, lumbar region: Secondary | ICD-10-CM | POA: Diagnosis not present

## 2016-03-09 DIAGNOSIS — R0681 Apnea, not elsewhere classified: Secondary | ICD-10-CM | POA: Diagnosis not present

## 2016-03-16 DIAGNOSIS — R7303 Prediabetes: Secondary | ICD-10-CM | POA: Diagnosis not present

## 2016-03-16 DIAGNOSIS — Z0000 Encounter for general adult medical examination without abnormal findings: Secondary | ICD-10-CM | POA: Diagnosis not present

## 2016-03-18 DIAGNOSIS — E291 Testicular hypofunction: Secondary | ICD-10-CM | POA: Diagnosis not present

## 2016-03-26 ENCOUNTER — Other Ambulatory Visit: Payer: Self-pay | Admitting: Family Medicine

## 2016-03-26 DIAGNOSIS — Z23 Encounter for immunization: Secondary | ICD-10-CM | POA: Diagnosis not present

## 2016-03-26 DIAGNOSIS — R7303 Prediabetes: Secondary | ICD-10-CM | POA: Diagnosis not present

## 2016-03-26 DIAGNOSIS — I10 Essential (primary) hypertension: Secondary | ICD-10-CM | POA: Diagnosis not present

## 2016-03-26 DIAGNOSIS — Z0000 Encounter for general adult medical examination without abnormal findings: Secondary | ICD-10-CM | POA: Diagnosis not present

## 2016-04-14 ENCOUNTER — Ambulatory Visit: Payer: PPO | Attending: Specialist

## 2016-04-14 DIAGNOSIS — G4733 Obstructive sleep apnea (adult) (pediatric): Secondary | ICD-10-CM | POA: Diagnosis not present

## 2016-04-14 DIAGNOSIS — G4761 Periodic limb movement disorder: Secondary | ICD-10-CM | POA: Diagnosis not present

## 2016-04-14 DIAGNOSIS — I10 Essential (primary) hypertension: Secondary | ICD-10-CM | POA: Insufficient documentation

## 2016-04-20 ENCOUNTER — Telehealth: Payer: Self-pay | Admitting: Family Medicine

## 2016-04-20 NOTE — Telephone Encounter (Signed)
Pt called stating that there has been no change in his achilles & would like to discuss what the next step is.

## 2016-04-20 NOTE — Telephone Encounter (Signed)
Patient is asking for you to give him a call regarding his l achilles tendon

## 2016-04-21 NOTE — Telephone Encounter (Signed)
Noted  

## 2016-04-21 NOTE — Telephone Encounter (Signed)
It has been 6 months since we have seen patient.  Likely would need to see him and look at it again.  If he needs refills of anything then that would be fine until I see him again.

## 2016-04-21 NOTE — Telephone Encounter (Signed)
Patient has called back.  Gave MD response and scheduled patient to come in on Thursday.

## 2016-04-22 NOTE — Progress Notes (Signed)
[name] [address] [place], [place] [address] Phone: [phone] Subjective:    I'm seeing this patient by the request  of:  [name]   CC: Left Achilles pain f/u  [id number]  Aaron Mitchell is a 70 y.o. male coming in with complaint of left Achilles pain. Found to have partial tearing at this time. She has not been seen for greater than 6 months. States that he continues to intermittently wear a Cam Walker. States that unfortunately he continues to give him pain. Not doing the exercises regularly. Has been fairly noncompliant with the medications. Has not been able to lose weight secondary to the discomfort and unable to work out. Also has degenerative disc disease in his lumbar spine. Overall worsening symptoms.   Past Medical History:  Diagnosis Date  . Achilles tendinitis    Left leg  . Borderline diabetes mellitus   . Chronic back pain    L4 deformity  . Degenerative disc disease, lumbar   . Erectile dysfunction   . Gout   . Hyperlipidemia   . Hypertension   . Male hypogonadism   . Obesity   . Seasonal allergies   . Shoulder bursitis    Past Surgical History:  Procedure Laterality Date  . ADENOIDECTOMY    . CARPAL TUNNEL RELEASE Right 2015  . RHINOPLASTY    . TONSILLECTOMY    . TRIGGER FINGER RELEASE Right    Social History   Social History  . Marital status: Married    Spouse name: N/A  . Number of children: N/A  . Years of education: N/A   Social History Main Topics  . Smoking status: Former Smoker    Quit date: 07/27/1978  . Smokeless tobacco: None     Comment: Quit in 1980  . Alcohol use 0.0 oz/week     Comment: Rarely  . Drug use: No  . Sexual activity: Not Asked   Other Topics Concern  . None   Social History Narrative   Lives at home with wife.   Allergies  Allergen Reactions  . Codeine Other (See Comments)    Other reaction(s): Other (See Comments) HEADACHES Causes headaches when he stops  liquid Codeine-based products   Family History  Problem Relation Age of Onset  . Alcohol abuse Father   . Hypertension Father   . CVA Father 61    Past medical history, social, surgical and family history all reviewed in electronic medical record.  No pertanent information unless stated regarding to the chief complaint.   Review of Systems: No headache, visual changes, nausea, vomiting, diarrhea, constipation, dizziness, abdominal pain, skin rash, fevers, chills, night sweats, weight loss, swollen lymph nodes, body aches, joint swelling, muscle aches, chest pain, shortness of breath, mood changes.   Objective  Blood pressure 112/80, pulse 65, SpO2 98 %.  General: No apparent distress alert and oriented x3 mood and affect normal, dressed appropriately. Severe obesity HEENT: Pupils equal, extraocular movements intact  Respiratory: Patient's speak in full sentences and does not appear short of breath  Cardiovascular: No lower extremity edema, non tender, no erythema  Skin: Warm dry intact with no signs of infection or rash on extremities or on axial skeleton.  Abdomen: Soft nontender  Neuro: Cranial nerves II through XII are intact, neurovascularly intact in all extremities with 2+ DTRs and 2+ pulses.  Lymph: No lymphadenopathy of posterior or anterior cervical chain or axillae bilaterally.  Gait antalgic gait MSK:  Non tender with  full range of motion and good stability and symmetric strength and tone of shoulders, elbows, wrist, hip, knee and bilaterally.  Ankle: Left Patient does have an Achilles nodule approximately 3 cm proximal to insertion. No significant change and continues to be tender Range of motion is full in all directions. Strength is 5/5 in all directions. Stable lateral and medial ligaments; squeeze test and kleiger test unremarkable; Talar dome nontender; No pain at base of 5th MT; No tenderness over cuboid; No tenderness over N spot or navicular prominence No  tenderness on posterior aspects of lateral and medial malleolus No sign of peroneal tendon subluxations or tenderness to palpation Negative tarsal tunnel tinel's Achilles is significant tender to palpation over the nodule. No significant pain at the insertion as well and possibly worsening.[name] Kitchen Negative Thompson test. Neurovascular intact distally. Able to walk 4 steps. Contralateral ankle unremarkable  MSK US performed of: Left Achilles This study was ordered, performed, and interpreted by Charlann Boxer D.O.  Foot/Ankle:   Left Achilles tendon and insertion has significant decrease in gouty deposits in calcific changes than previous exam the patient does have increasing Doppler flow proximal 0.4 cm proximal to this area. No true tearing appreciated but significant intra tendon vascularization..  IMPRESSION:  Significant increased Doppler flow 4 cm from insertion of the Achilles.     Impression and Recommendations:     This case required medical decision making of moderate complexity.      Note: This dictation was prepared with Dragon dictation along with smaller phrase technology. Any transcriptional errors that result from this process are unintentional.

## 2016-04-23 ENCOUNTER — Ambulatory Visit (INDEPENDENT_AMBULATORY_CARE_PROVIDER_SITE_OTHER): Payer: PPO | Admitting: Family Medicine

## 2016-04-23 ENCOUNTER — Encounter: Payer: Self-pay | Admitting: Family Medicine

## 2016-04-23 ENCOUNTER — Other Ambulatory Visit: Payer: Self-pay

## 2016-04-23 VITALS — BP 112/80 | HR 65

## 2016-04-23 DIAGNOSIS — M7662 Achilles tendinitis, left leg: Secondary | ICD-10-CM

## 2016-04-23 MED ORDER — NITROGLYCERIN 0.2 MG/HR TD PT24: MEDICATED_PATCH | TRANSDERMAL | 1 refills | Status: DC

## 2016-04-23 MED ORDER — VITAMIN D (ERGOCALCIFEROL) 1.25 MG (50000 UNIT) PO CAPS: 50000.0000 [IU] | ORAL_CAPSULE | ORAL | 0 refills | Status: DC

## 2016-04-23 NOTE — Assessment & Plan Note (Signed)
Continues to have significant amount of increased Doppler flow on ultrasound. We discussed icing regimen and home exercises. We discussed objective is to do an which was potentially avoid. Patient will continue with the Cam Walker. Started and 90 glycerin and warned of potential side effects. Patient also started on once weekly vitamin D. Encourage weight loss. Follow-up again in 3-4 weeks.

## 2016-04-23 NOTE — Patient Instructions (Signed)
Good to see you  Ice 20 minutes 2 times daily. Usually after activity and before bed. Continue the boot.  Heel lift could be great  Once weekly for 12 weeks.  Nitroglycerin Protocol   Apply 1/4 nitroglycerin patch to affected area daily.  Change position of patch within the affected area every 24 hours.  You may experience a headache during the first 1-2 weeks of using the patch, these should subside.  If you experience headaches after beginning nitroglycerin patch treatment, you may take your preferred over the counter pain reliever.  Another side effect of the nitroglycerin patch is skin irritation or rash related to patch adhesive.  Please notify our office if you develop more severe headaches or rash, and stop the patch.  Tendon healing with nitroglycerin patch may require 12 to 24 weeks depending on the extent of injury.  Men should not use if taking Viagra, Cialis, or Levitra.   Do not use if you have migraines or rosacea.  See me again in 3 weeks and make sure you are doing better.

## 2016-04-24 ENCOUNTER — Telehealth: Payer: Self-pay | Admitting: Family Medicine

## 2016-04-24 ENCOUNTER — Encounter: Payer: Self-pay | Admitting: Family Medicine

## 2016-04-24 NOTE — Telephone Encounter (Signed)
Discussed with pt

## 2016-04-24 NOTE — Telephone Encounter (Signed)
1/8 to 1/4 inch

## 2016-04-24 NOTE — Telephone Encounter (Signed)
Pt request to speak to the assistant concern about heel pad that Dr. Tamala Julian want him to buy. He wants to know how thick does it need to be. Please call him back

## 2016-05-12 ENCOUNTER — Encounter: Payer: Self-pay | Admitting: Family Medicine

## 2016-05-12 ENCOUNTER — Ambulatory Visit (INDEPENDENT_AMBULATORY_CARE_PROVIDER_SITE_OTHER): Payer: PPO | Admitting: Family Medicine

## 2016-05-12 DIAGNOSIS — M25512 Pain in left shoulder: Secondary | ICD-10-CM

## 2016-05-12 DIAGNOSIS — M51369 Other intervertebral disc degeneration, lumbar region without mention of lumbar back pain or lower extremity pain: Secondary | ICD-10-CM | POA: Insufficient documentation

## 2016-05-12 DIAGNOSIS — M25511 Pain in right shoulder: Secondary | ICD-10-CM

## 2016-05-12 DIAGNOSIS — M150 Primary generalized (osteo)arthritis: Secondary | ICD-10-CM

## 2016-05-12 DIAGNOSIS — E785 Hyperlipidemia, unspecified: Secondary | ICD-10-CM

## 2016-05-12 DIAGNOSIS — M7662 Achilles tendinitis, left leg: Secondary | ICD-10-CM

## 2016-05-12 DIAGNOSIS — G4733 Obstructive sleep apnea (adult) (pediatric): Secondary | ICD-10-CM | POA: Insufficient documentation

## 2016-05-12 DIAGNOSIS — R7303 Prediabetes: Secondary | ICD-10-CM | POA: Diagnosis not present

## 2016-05-12 DIAGNOSIS — M549 Dorsalgia, unspecified: Secondary | ICD-10-CM

## 2016-05-12 DIAGNOSIS — G4700 Insomnia, unspecified: Secondary | ICD-10-CM

## 2016-05-12 DIAGNOSIS — M1A00X0 Idiopathic chronic gout, unspecified site, without tophus (tophi): Secondary | ICD-10-CM | POA: Diagnosis not present

## 2016-05-12 DIAGNOSIS — M5136 Other intervertebral disc degeneration, lumbar region: Secondary | ICD-10-CM

## 2016-05-12 DIAGNOSIS — M159 Polyosteoarthritis, unspecified: Secondary | ICD-10-CM

## 2016-05-12 DIAGNOSIS — I10 Essential (primary) hypertension: Secondary | ICD-10-CM

## 2016-05-12 DIAGNOSIS — G8929 Other chronic pain: Secondary | ICD-10-CM

## 2016-05-12 DIAGNOSIS — M8949 Other hypertrophic osteoarthropathy, multiple sites: Secondary | ICD-10-CM

## 2016-05-12 MED ORDER — AMLODIPINE BESYLATE 5 MG PO TABS: 5.0000 mg | ORAL_TABLET | Freq: Two times a day (BID) | ORAL | 3 refills | Status: DC

## 2016-05-12 MED ORDER — LEVOCETIRIZINE DIHYDROCHLORIDE 5 MG PO TABS: 5.0000 mg | ORAL_TABLET | Freq: Every evening | ORAL | 3 refills | Status: DC

## 2016-05-12 MED ORDER — LISINOPRIL-HYDROCHLOROTHIAZIDE 20-12.5 MG PO TABS: 1.0000 | ORAL_TABLET | Freq: Every day | ORAL | 3 refills | Status: DC

## 2016-05-12 MED ORDER — ALLOPURINOL 300 MG PO TABS: ORAL_TABLET | ORAL | 1 refills | Status: DC

## 2016-05-12 NOTE — Assessment & Plan Note (Signed)
Pt denies this dx, off medication - will check FLP next fasting labs.

## 2016-05-12 NOTE — Assessment & Plan Note (Signed)
Per patient this improved with weight loss - continue to monitor. Off antihyperglycemics.

## 2016-05-12 NOTE — Progress Notes (Signed)
BP 136/82   Pulse 72   Temp 97.8 F (36.6 C) (Oral)   Ht 5\' 9"  (1.753 m)   Wt 287 lb 12 oz (130.5 kg)   BMI 42.49 kg/m    CC: new pt to establish care Subjective:    Patient ID: Aaron Mitchell, male    DOB: 1946/01/13, 70 y.o.   MRN: [id number]  HPI: Aaron Mitchell is a 70 y.o. male presenting on 05/12/2016 for Establish Care   Prior saw Dr Netty Starring at [facility]. Was tired of seeing multiple specialists. Zebedee Iba is his insurance agent.   Recent dx OSA pending home respiratory set up with CPAP. Seen by Dr Raul Del.   Gout - controlled with allopurinol 100mg  daily, recently increased to 300mg  to facilitate tendon healing.   Spontaneous L achilles tendon tear early Nov 21, 2015 - followed by Dr Charlann Boxer. On vitamin D 50,000 IU and nitroglycerin patch for this as well.   OA of lower back and bilateral hands - controlled with voltaren tablets and gel. Rare hydrocodone. ESI regularly by Dr Sharlet Salina @ [facility]. Seen by Dr Radford Pax at [facility] - recommended against any surgery.   H/o shoulder bursitis - treated with intermittent steroid injections.   HTN - compliant with amlodipine 5mg  bid, lisinopril hctz 20/12.5mg  daily,  Hypogonadism - 100mg  once weekly, prescribed by Dr [name] urologist.   Rare Lorrin Mais for sleep.   H/o diabetes - improved with weight loss.   Recent intestinal volvulus? Hospitalized in [place] 11-21-15, treated with cipro abx (this was after achilles tear). Records reviewed - he was hospitalized 12/2015 for acute GI bleed with ARF and ileus vs SBO. Spontaneously resolved.   Preventative: CPE with Dr Netty Starring 02/2016.   Lives at home with wife [name]), dog passed away 11/21/2015 1 grown child Occ: retired Clinical biochemist, was Chief Technology Officer  Edu: 2 yrs college Activity: no regular exercise  Diet: good water, fruits daily, splenda iced tea   Relevant past medical, surgical, family and social history reviewed and updated as indicated. Interim medical history since our  last visit reviewed. Allergies and medications reviewed and updated. Current Outpatient Prescriptions on File Prior to Visit  Medication Sig  . diclofenac (VOLTAREN) 75 MG EC tablet Take 75 mg by mouth 2 (two) times daily as needed. For pain.  [name] Kitchen diclofenac sodium (VOLTAREN) 1 % GEL Apply 2 g topically daily as needed. Apply to both hands.  . fluticasone (FLONASE) 50 MCG/ACT nasal spray Place 2 sprays into both nostrils daily.  . metaxalone (SKELAXIN) 800 MG tablet Take 800 mg by mouth 3 (three) times daily as needed. For muscle RELAXER.  . Multiple Vitamins tablet Take 1 tablet by mouth daily.  . nitroGLYCERIN (NITRODUR - DOSED IN MG/24 HR) 0.2 mg/hr patch 1/4 patch daily  . testosterone cypionate (DEPOTESTOSTERONE CYPIONATE) 200 MG/ML injection Inject 100 mg into the muscle every 7 (seven) days. Use on Friday.  . Vitamin D, Ergocalciferol, (DRISDOL) 50000 units CAPS capsule Take 1 capsule (50,000 Units total) by mouth every 7 (seven) days.  [name] Kitchen zolpidem (AMBIEN) 10 MG tablet Take 10 mg by mouth at bedtime as needed. For sleep.   No current facility-administered medications on file prior to visit.     Review of Systems Per HPI unless specifically indicated in ROS section     Objective:    BP 136/82   Pulse 72   Temp 97.8 F (36.6 C) (Oral)   Ht 5\' 9"  (1.753 m)   Wt 287 lb 12 oz (130.5  kg)   BMI 42.49 kg/m   Wt Readings from Last 3 Encounters:  05/12/16 287 lb 12 oz (130.5 kg)  12/28/15 280 lb (127 kg)  12/28/15 280 lb (127 kg)    Physical Exam  Constitutional: He appears well-developed and well-nourished. No distress.  HENT:  Mouth/Throat: Oropharynx is clear and moist. No oropharyngeal exudate.  Eyes: Conjunctivae and EOM are normal. Pupils are equal, round, and reactive to light. No scleral icterus.  Neck: Normal range of motion. Neck supple. No thyromegaly present.  Cardiovascular: Normal rate, regular rhythm and intact distal pulses.   Murmur (faint 1/6 systolic)  heard. Pulmonary/Chest: Effort normal and breath sounds normal. No respiratory distress. He has no wheezes. He has no rales.  Musculoskeletal: He exhibits no edema.  Lymphadenopathy:    He has no cervical adenopathy.  Skin: Skin is warm and dry. No rash noted.  Psychiatric: He has a normal mood and affect.  Nursing note and vitals reviewed.  Results for orders placed or performed in visit on 01/17/16  HM DIABETES EYE EXAM  Result Value Ref Range   HM Diabetic Eye Exam No Retinopathy No Retinopathy  HM DIABETES EYE EXAM  Result Value Ref Range   HM Diabetic Eye Exam No Retinopathy No Retinopathy      Assessment & Plan:  Over 45 minutes were spent face-to-face with the patient during this encounter and >50% of that time was spent on counseling and coordination of care  Problem List Items Addressed This Visit    Borderline diabetes mellitus    Per patient this improved with weight loss - continue to monitor. Off antihyperglycemics.      Chronic gouty arthropathy    Stable on higher allopurinol dose while achilles tendon heals (prior on 100mg  daily)      Chronic shoulder pain    Pt requests shoulder steroid injections on average 2-3 per year. Advised this is not recommendable given concern with repeated steroid injections leading to shoulder joint damage. Pt still desires treatment if needed, not at all interested in surgical intervention.       Degenerative disc disease, lumbar    Has been recommended against any surgery by [facility].  Pt treats with aleve 2-3 tablets twice daily and rare hydrocodone, declines refill today.       Relevant Medications   HYDROcodone-acetaminophen (LORTAB) 10-500 MG tablet   aspirin EC 81 MG tablet   naproxen sodium (ANAPROX) 220 MG tablet   allopurinol (ZYLOPRIM) 300 MG tablet   Essential (primary) hypertension    Chronic, stable on current regimen. Refilled today.      Relevant Medications   aspirin EC 81 MG tablet   amLODipine (NORVASC)  5 MG tablet   lisinopril-hydrochlorothiazide (PRINZIDE,ZESTORETIC) 20-12.5 MG tablet   HLD (hyperlipidemia)    Pt denies this dx, off medication - will check FLP next fasting labs.       Relevant Medications   aspirin EC 81 MG tablet   amLODipine (NORVASC) 5 MG tablet   lisinopril-hydrochlorothiazide (PRINZIDE,ZESTORETIC) 20-12.5 MG tablet   Insomnia    Rare ambien use, did not need refill today. Will need to review sleep hygiene measures.       Left Achilles tendinitis    Appreciate sports medicine care of patient.      OSA (obstructive sleep apnea)    Pending home CPAP placement      Osteoarthritis    Longstanding - predominantly of lower back and hands. Takes aleve, diclofenac tablets, and diclofenac gel.  Advised stop diclofenac tablets esp in setting of recent GI bleed earlier this year.       Relevant Medications   HYDROcodone-acetaminophen (LORTAB) 10-500 MG tablet   aspirin EC 81 MG tablet   naproxen sodium (ANAPROX) 220 MG tablet   allopurinol (ZYLOPRIM) 300 MG tablet   Severe obesity (BMI >= 40) (HCC)    Continue to encourage weight loss.       Other Visit Diagnoses   None.      Follow up plan: Return in about 4 months (around 09/12/2016), or as needed, for follow up visit.  [name], MD

## 2016-05-12 NOTE — Assessment & Plan Note (Signed)
Rare ambien use, did not need refill today. Will need to review sleep hygiene measures.

## 2016-05-12 NOTE — Assessment & Plan Note (Addendum)
Pending home CPAP placement

## 2016-05-12 NOTE — Assessment & Plan Note (Signed)
Appreciate sports medicine care of patient.

## 2016-05-12 NOTE — Assessment & Plan Note (Signed)
Pt requests shoulder steroid injections on average 2-3 per year. Advised this is not recommendable given concern with repeated steroid injections leading to shoulder joint damage. Pt still desires treatment if needed, not at all interested in surgical intervention.

## 2016-05-12 NOTE — Progress Notes (Signed)
Pre visit review using our clinic review tool, if applicable. No additional management support is needed unless otherwise documented below in the visit note. 

## 2016-05-12 NOTE — Patient Instructions (Addendum)
Try backing off diclofenac if you're taking aleve.  I've refilled medicines for now.  Return in 4-6 months for follow up visit and fasting labs.  Nice to meet you. Call us with questions.

## 2016-05-12 NOTE — Assessment & Plan Note (Signed)
Continue to encourage weight loss. 

## 2016-05-12 NOTE — Assessment & Plan Note (Signed)
Chronic, stable on current regimen. Refilled today.

## 2016-05-12 NOTE — Assessment & Plan Note (Signed)
Has been recommended against any surgery by [facility].  Pt treats with aleve 2-3 tablets twice daily and rare hydrocodone, declines refill today.

## 2016-05-12 NOTE — Assessment & Plan Note (Signed)
Stable on higher allopurinol dose while achilles tendon heals (prior on 100mg  daily)

## 2016-05-12 NOTE — Assessment & Plan Note (Signed)
Longstanding - predominantly of lower back and hands. Takes aleve, diclofenac tablets, and diclofenac gel. Advised stop diclofenac tablets esp in setting of recent GI bleed earlier this year.

## 2016-05-26 ENCOUNTER — Encounter: Payer: Self-pay | Admitting: Family Medicine

## 2016-05-26 DIAGNOSIS — G4733 Obstructive sleep apnea (adult) (pediatric): Secondary | ICD-10-CM | POA: Diagnosis not present

## 2016-05-27 DIAGNOSIS — M5126 Other intervertebral disc displacement, lumbar region: Secondary | ICD-10-CM | POA: Diagnosis not present

## 2016-05-27 DIAGNOSIS — M5416 Radiculopathy, lumbar region: Secondary | ICD-10-CM | POA: Diagnosis not present

## 2016-05-27 DIAGNOSIS — M48062 Spinal stenosis, lumbar region with neurogenic claudication: Secondary | ICD-10-CM | POA: Diagnosis not present

## 2016-06-10 ENCOUNTER — Ambulatory Visit (INDEPENDENT_AMBULATORY_CARE_PROVIDER_SITE_OTHER): Payer: PPO | Admitting: Family Medicine

## 2016-06-10 ENCOUNTER — Encounter: Payer: Self-pay | Admitting: Family Medicine

## 2016-06-10 VITALS — BP 160/90 | HR 57 | Temp 98.2°F | Ht 69.0 in | Wt 296.2 lb

## 2016-06-10 DIAGNOSIS — M25512 Pain in left shoulder: Secondary | ICD-10-CM | POA: Diagnosis not present

## 2016-06-10 DIAGNOSIS — M19019 Primary osteoarthritis, unspecified shoulder: Secondary | ICD-10-CM

## 2016-06-10 DIAGNOSIS — M25511 Pain in right shoulder: Secondary | ICD-10-CM

## 2016-06-10 DIAGNOSIS — M7540 Impingement syndrome of unspecified shoulder: Secondary | ICD-10-CM

## 2016-06-10 MED ORDER — TRIAMCINOLONE ACETONIDE 40 MG/ML IJ SUSP
80.0000 mg | Freq: Once | INTRAMUSCULAR | Status: AC
  Administered 2016-06-10: 80 mg via INTRA_ARTICULAR

## 2016-06-10 NOTE — Addendum Note (Signed)
Addended by: Carter Kitten on: 06/10/2016 10:59 AM   Modules accepted: Orders

## 2016-06-10 NOTE — Progress Notes (Addendum)
Dr. Frederico Hamman T. Omari Koslosky, MD, [facility] [place], [address] Phone: [phone] Fax: [phone]  06/10/2016  Patient: Aaron Mitchell, MRN: [id number], DOB: 12-Feb-1946, 70 y.o.  Primary Physician:  Aaron Bush, MD   Chief Complaint  Patient presents with  . Shoulder Pain    Biltateral Bursitis-Wants injections   Subjective:   Aaron Mitchell is a 70 y.o. very pleasant male patient who presents with the following:  Chronic shoulder pain for many years.  More than 10 - 15 years of shoulder pain.  Had a volvulus last year also had renal failure.   The patient noted above presents with shoulder pain that has been ongoing for 10-15 years there is no history of trauma or accident. The patient denies neck pain or radicular symptoms. Denies dislocation, subluxation, separation of the shoulder. The patient does complain of pain in the overhead plane.  Medications Tried: nsaids Ice or Heat: Tried PT: distant  Prior shoulder Injury: No Prior surgery: No Prior fracture: No  Past Medical History, Surgical History, Social History, Family History, Problem List, Medications, and Allergies have been reviewed and updated if relevant.  Patient Active Problem List   Diagnosis Date Noted  . Insomnia 05/12/2016  . OSA (obstructive sleep apnea) 05/12/2016  . Chronic back pain   . Degenerative disc disease, lumbar   . Left Achilles tendinitis 10/04/2015  . HLD (hyperlipidemia) 09/17/2015  . Benign prostatic hyperplasia with urinary obstruction 03/25/2015  . ED (erectile dysfunction) of organic origin 03/25/2015  . CMC arthritis, thumb, degenerative 01/19/2013  . Triggering of digit 01/19/2013  . Rotator cuff impingement syndrome 10/20/2011  . Chronic gouty arthropathy 04/02/2011  . Severe obesity (BMI >= 40) ([place]) 04/02/2011  . Osteoarthritis 04/02/2011  . Allergic rhinitis, seasonal 04/02/2011  . Chronic shoulder  pain 04/02/2011  . Borderline diabetes mellitus 03/10/2011  . Essential (primary) hypertension 03/10/2011    Past Medical History:  Diagnosis Date  . Achilles tendinitis    Left leg  . Borderline diabetes mellitus   . Childhood asthma   . Degenerative disc disease, lumbar    rec against surgery  . Erectile dysfunction   . GI bleed 12/2015   hospitalization  . Gout   . Hypertension   . Ileus ([place]) 12/2015   hospitalization   . Male hypogonadism   . Seasonal allergies    pollen  . Severe obesity (BMI >= 40) ([facility])   . Shoulder bursitis     Past Surgical History:  Procedure Laterality Date  . ADENOIDECTOMY    . CARPAL TUNNEL RELEASE Right 2015  . COLONOSCOPY  10/2012   rpt 5 yrs ([facility])  . RHINOPLASTY    . TONSILLECTOMY  ?1954  . TRIGGER FINGER RELEASE Right 2015   4th    Social History   Social History  . Marital status: Married    Spouse name: N/A  . Number of children: N/A  . Years of education: N/A   Occupational History  . Not on file.   Social History Main Topics  . Smoking status: Former Smoker    Quit date: 07/27/1978  . Smokeless tobacco: Never Used     Comment: Quit in 1980  . Alcohol use 0.0 oz/week     Comment: occasionally  . Drug use: No  . Sexual activity: Not on file   Other Topics Concern  . Not on file   Social History Narrative   Lives at home with  wife Aaron [facility]), dog passed away 2015-11-19   1 grown child   Occ: retired Clinical biochemist, was Chief Technology Officer    Edu: 2 yrs college   Activity: no regular exercise   Diet: good water, fruits daily, splenda iced tea    Family History  Problem Relation Age of Onset  . Alcohol abuse Father   . Hypertension Father   . AVM Father 31    hemorrhagic stroke  . Diabetes Paternal Grandmother   . Cancer Neg Hx   . CAD Neg Hx     Allergies  Allergen Reactions  . Codeine Other (See Comments)    Other reaction(s): Other (See Comments) HEADACHES Causes headaches when he stops liquid  Codeine-based products    Medication list reviewed and updated in full in [facility].  GEN: No fevers, chills. Nontoxic. Primarily MSK c/o today. MSK: Detailed in the HPI GI: tolerating PO intake without difficulty Neuro: No numbness, parasthesias, or tingling associated. Otherwise the pertinent positives of the ROS are noted above.   Objective:   BP (!) 160/90   Pulse (!) 57   Temp 98.2 F (36.8 C) (Oral)   Ht 5\' 9"  (1.753 m)   Wt 296 lb 4 oz (134.4 kg)   BMI 43.75 kg/m    GEN: Well-developed,well-nourished,in no acute distress; alert,appropriate and cooperative throughout examination HEENT: Normocephalic and atraumatic without obvious abnormalities. Ears, externally no deformities PULM: Breathing comfortably in no respiratory distress EXT: No clubbing, cyanosis, or edema PSYCH: Normally interactive. Cooperative during the interview. Pleasant. Friendly and conversant. Not anxious or depressed appearing. Normal, full affect.  Shoulder: B Inspection: No muscle wasting or winging Ecchymosis/edema: neg  AC joint, scapula, clavicle: ttp ac joint Cervical spine: NT, full ROM Spurling's: neg Abduction: full, 5/5 Flexion: full, 5/5 IR, full, lift-off: 5/5 ER at neutral: full, 5/5 AC crossover: pos Neer: pos Hawkins: pos Drop Test: neg Empty Can: pos Supraspinatus insertion: mild-mod T Bicipital groove: NT Speed's: neg Yergason's: neg Sulcus sign: neg Scapular dyskinesis: none C5-T1 intact  Neuro: Sensation intact Grip 5/5   Radiology: No results found.  Assessment and Plan:   Bilateral shoulder pain, unspecified chronicity  Shoulder impingement syndrome, unspecified laterality  AC joint arthropathy  counselled decrease nsaid dosage  SubAC Injection, R Verbal consent was obtained from the patient. Risks (including rare infection), benefits, and alternatives were explained. Patient prepped with Chloraprep and Ethyl Chloride used for anesthesia. The  subacromial space was injected using the posterior approach. The patient tolerated the procedure well and had decreased pain post injection. No complications. Injection: 8 cc of Lidocaine 1% and 2 mL of Kenalog 40 mg. Needle: 22 gauge   SubAC Injection, L Verbal consent was obtained from the patient. Risks (including rare infection), benefits, and alternatives were explained. Patient prepped with Chloraprep and Ethyl Chloride used for anesthesia. The subacromial space was injected using the posterior approach. The patient tolerated the procedure well and had decreased pain post injection. No complications. Injection: 8 cc of Lidocaine 1% and 2 mL of Kenalog 40 mg. Needle: 22 gauge   Follow-up: No Follow-up on file.  Signed,  Maud Deed. Treg Diemer, MD     Medication List       Accurate as of 06/10/16  9:26 AM. Always use your most recent med list.          allopurinol 300 MG tablet Commonly known as:  ZYLOPRIM TAKE 1 TABLET(300 MG) BY MOUTH DAILY   amLODipine 5 MG tablet Commonly known as:  NORVASC Take 1 tablet (5 mg total) by mouth 2 (two) times daily.   aspirin EC 81 MG tablet Take 81 mg by mouth daily.   diclofenac 75 MG EC tablet Commonly known as:  VOLTAREN Take 75 mg by mouth 2 (two) times daily as needed. For pain.   diclofenac sodium 1 % Gel Commonly known as:  VOLTAREN Apply 2 g topically daily as needed. Apply to both hands.   fluticasone 50 MCG/ACT nasal spray Commonly known as:  FLONASE Place 2 sprays into both nostrils daily.   HYDROcodone-acetaminophen 10-500 MG tablet Commonly known as:  LORTAB Take 1 tablet by mouth every 8 (eight) hours as needed for pain.   L-Lysine 500 MG Tabs Take 500 mg by mouth daily.   levocetirizine 5 MG tablet Commonly known as:  XYZAL Take 1 tablet (5 mg total) by mouth every evening.   lisinopril-hydrochlorothiazide 20-12.5 MG tablet Commonly known as:  PRINZIDE,ZESTORETIC Take 1 tablet by mouth daily.     metaxalone 800 MG tablet Commonly known as:  SKELAXIN Take 800 mg by mouth 3 (three) times daily as needed. For muscle RELAXER.   Multiple Vitamins tablet Take 1 tablet by mouth daily.   naproxen sodium 220 MG tablet Commonly known as:  ANAPROX Take 440 mg by mouth 2 (two) times daily with a meal.   nitroGLYCERIN 0.2 mg/hr patch Commonly known as:  NITRODUR - Dosed in mg/24 hr 1/4 patch daily   testosterone cypionate 200 MG/ML injection Commonly known as:  DEPOTESTOSTERONE CYPIONATE Inject 100 mg into the muscle every 7 (seven) days. Use on Friday.   Vitamin D (Ergocalciferol) 50000 units Caps capsule Commonly known as:  DRISDOL Take 1 capsule (50,000 Units total) by mouth every 7 (seven) days.   zolpidem 10 MG tablet Commonly known as:  AMBIEN Take 10 mg by mouth at bedtime as needed. For sleep.

## 2016-06-10 NOTE — Progress Notes (Signed)
Pre visit review using our clinic review tool, if applicable. No additional management support is needed unless otherwise documented below in the visit note. 

## 2016-06-11 DIAGNOSIS — H2513 Age-related nuclear cataract, bilateral: Secondary | ICD-10-CM | POA: Diagnosis not present

## 2016-06-17 ENCOUNTER — Telehealth: Payer: Self-pay

## 2016-06-17 NOTE — Telephone Encounter (Signed)
Patient is a new patient, he is having some troubles with his CPAP machine. [facility] said that a note could be sent to them to have the pressure readjusted. The technician said to have the Rx say to automatically adjust between 8-14. It is set on 14 right now and that is too high. Patient would like a call when this is done.   Patient's number: [phone]

## 2016-06-21 ENCOUNTER — Other Ambulatory Visit: Payer: Self-pay | Admitting: Family Medicine

## 2016-06-22 ENCOUNTER — Other Ambulatory Visit: Payer: Self-pay | Admitting: Family Medicine

## 2016-06-22 DIAGNOSIS — G4733 Obstructive sleep apnea (adult) (pediatric): Secondary | ICD-10-CM | POA: Diagnosis not present

## 2016-06-22 NOTE — Telephone Encounter (Signed)
Rx written and in Kim's box. If needs more details on sleep study will need to come from Dr [facility] office.

## 2016-06-22 NOTE — Telephone Encounter (Signed)
eScribe request from [facility] for refill on Dilofenac 75 mg EC tab Last filled - D/C on 02/04/15, in Historical mode only Last AEX - 05/12/16 Next AEX - 4-Mths Please Advise on refills/SLS 11/27

## 2016-06-22 NOTE — Telephone Encounter (Signed)
Pt called checking on this. rx has been faxed to [facility] [phone]

## 2016-06-22 NOTE — Telephone Encounter (Signed)
eScribe request from [facility] for refill on Skelaxin 800 mg Last filled - D/C at Hospital [facility], only in Hx [non-active] Last AEX - 05/12/16 Next AEX - 4-Mths Please Advise on refills/SLS 11/27

## 2016-06-24 ENCOUNTER — Encounter: Payer: Self-pay | Admitting: Family Medicine

## 2016-06-25 DIAGNOSIS — G4733 Obstructive sleep apnea (adult) (pediatric): Secondary | ICD-10-CM | POA: Diagnosis not present

## 2016-06-30 DIAGNOSIS — H2513 Age-related nuclear cataract, bilateral: Secondary | ICD-10-CM | POA: Diagnosis not present

## 2016-07-01 ENCOUNTER — Telehealth: Payer: Self-pay | Admitting: Family Medicine

## 2016-07-01 MED ORDER — VITAMIN D (ERGOCALCIFEROL) 1.25 MG (50000 UNIT) PO CAPS
50000.0000 [IU] | ORAL_CAPSULE | ORAL | 0 refills | Status: DC
Start: 2016-07-01 — End: 2016-10-26

## 2016-07-01 NOTE — Telephone Encounter (Signed)
   refilled vit D Can do the patch higher but could have headaches. Try but if symptoms then discontinue

## 2016-07-01 NOTE — Telephone Encounter (Signed)
Patient would like to know if he could increase nitroglycerin patch to a half patch.  Also states he is taking his last VIT D pill today and would like to know if that needs to be refilled.

## 2016-07-01 NOTE — Telephone Encounter (Signed)
Discussed with pt

## 2016-07-10 NOTE — Discharge Instructions (Signed)
Cataract Surgery, Care After °Refer to this sheet in the next few weeks. These instructions provide you with information about caring for yourself after your procedure. Your health care provider may also give you more specific instructions. Your treatment has been planned according to current medical practices, but problems sometimes occur. Call your health care provider if you have any problems or questions after your procedure. °What can I expect after the procedure? °After the procedure, it is common to have: °· Itching. °· Discomfort. °· Fluid discharge. °· Sensitivity to light and to touch. °· Bruising. °Follow these instructions at home: °Eye Care  °· Check your eye every day for signs of infection. Watch for: °¨ Redness, swelling, or pain. °¨ Fluid, blood, or pus. °¨ Warmth. °¨ Bad smell. °Activity  °· Avoid strenuous activities, such as playing contact sports, for as long as told by your health care provider. °· Do not drive or operate heavy machinery until your health care provider approves. °· Do not bend or lift heavy objects . Bending increases pressure in the eye. You can walk, climb stairs, and do light household chores. °· Ask your health care provider when you can return to work. If you work in a dusty environment, you may be advised to wear protective eyewear for a period of time. °General instructions  °· Take or apply over-the-counter and prescription medicines only as told by your health care provider. This includes eye drops. °· Do not touch or rub your eyes. °· If you were given a protective shield, wear it as told by your health care provider. If you were not given a protective shield, wear sunglasses as told by your health care provider to protect your eyes. °· Keep the area around your eye clean and dry. Avoid swimming or allowing water to hit you directly in the face while showering until told by your health care provider. Keep soap and shampoo out of your eyes. °· Do not put a contact lens  into the affected eye or eyes until your health care provider approves. °· Keep all follow-up visits as told by your health care provider. This is important. °Contact a health care provider if: ° °· You have increased bruising around your eye. °· You have pain that is not helped with medicine. °· You have a fever. °· You have redness, swelling, or pain in your eye. °· You have fluid, blood, or pus coming from your incision. °· Your vision gets worse. °Get help right away if: °· You have sudden vision loss. °This information is not intended to replace advice given to you by your health care provider. Make sure you discuss any questions you have with your health care provider. °Document Released: 01/30/2005 Document Revised: 11/21/2015 Document Reviewed: 05/23/2015 °Elsevier Interactive Patient Education © 2017 Elsevier Inc. ° ° ° ° °General Anesthesia, Adult, Care After °These instructions provide you with information about caring for yourself after your procedure. Your health care provider may also give you more specific instructions. Your treatment has been planned according to current medical practices, but problems sometimes occur. Call your health care provider if you have any problems or questions after your procedure. °What can I expect after the procedure? °After the procedure, it is common to have: °· Vomiting. °· A sore throat. °· Mental slowness. °It is common to feel: °· Nauseous. °· Cold or shivery. °· Sleepy. °· Tired. °· Sore or achy, even in parts of your body where you did not have surgery. °Follow these instructions at   home: °For at least 24 hours after the procedure:  °· Do not: °¨ Participate in activities where you could fall or become injured. °¨ Drive. °¨ Use heavy machinery. °¨ Drink alcohol. °¨ Take sleeping pills or medicines that cause drowsiness. °¨ Make important decisions or sign legal documents. °¨ Take care of children on your own. °· Rest. °Eating and drinking  °· If you vomit, drink  water, juice, or soup when you can drink without vomiting. °· Drink enough fluid to keep your urine clear or pale yellow. °· Make sure you have little or no nausea before eating solid foods. °· Follow the diet recommended by your health care provider. °General instructions  °· Have a responsible adult stay with you until you are awake and alert. °· Return to your normal activities as told by your health care provider. Ask your health care provider what activities are safe for you. °· Take over-the-counter and prescription medicines only as told by your health care provider. °· If you smoke, do not smoke without supervision. °· Keep all follow-up visits as told by your health care provider. This is important. °Contact a health care provider if: °· You continue to have nausea or vomiting at home, and medicines are not helpful. °· You cannot drink fluids or start eating again. °· You cannot urinate after 8-12 hours. °· You develop a skin rash. °· You have fever. °· You have increasing redness at the site of your procedure. °Get help right away if: °· You have difficulty breathing. °· You have chest pain. °· You have unexpected bleeding. °· You feel that you are having a life-threatening or urgent problem. °This information is not intended to replace advice given to you by your health care provider. Make sure you discuss any questions you have with your health care provider. °Document Released: 10/19/2000 Document Revised: 12/16/2015 Document Reviewed: 06/27/2015 °Elsevier Interactive Patient Education © 2017 Elsevier Inc. ° °

## 2016-07-13 ENCOUNTER — Encounter: Payer: Self-pay | Admitting: *Deleted

## 2016-07-13 ENCOUNTER — Telehealth: Payer: Self-pay | Admitting: Family Medicine

## 2016-07-13 NOTE — Telephone Encounter (Signed)
Discussed with pt

## 2016-07-13 NOTE — Telephone Encounter (Signed)
Pt called in and has a few question about some meds that Dr Tamala Julian gave him.  He is concerned about some of the interactions with other meds he is taking

## 2016-07-15 ENCOUNTER — Ambulatory Visit: Payer: PPO | Admitting: Anesthesiology

## 2016-07-15 ENCOUNTER — Encounter
Admission: RE | Disposition: A | Discharge status: Home / Self Care | Payer: Self-pay | Source: Ambulatory Visit | Attending: Ophthalmology

## 2016-07-15 ENCOUNTER — Ambulatory Visit
Admission: RE | Admit: 2016-07-15 | Discharge: 2016-07-15 | Disposition: A | Discharge status: Home / Self Care | Payer: PPO | Source: Ambulatory Visit | Attending: Ophthalmology | Admitting: Ophthalmology

## 2016-07-15 DIAGNOSIS — H2511 Age-related nuclear cataract, right eye: Secondary | ICD-10-CM | POA: Diagnosis not present

## 2016-07-15 DIAGNOSIS — H2513 Age-related nuclear cataract, bilateral: Secondary | ICD-10-CM | POA: Diagnosis not present

## 2016-07-15 DIAGNOSIS — G4730 Sleep apnea, unspecified: Secondary | ICD-10-CM | POA: Insufficient documentation

## 2016-07-15 DIAGNOSIS — E669 Obesity, unspecified: Secondary | ICD-10-CM | POA: Insufficient documentation

## 2016-07-15 DIAGNOSIS — I10 Essential (primary) hypertension: Secondary | ICD-10-CM | POA: Insufficient documentation

## 2016-07-15 DIAGNOSIS — Z87891 Personal history of nicotine dependence: Secondary | ICD-10-CM | POA: Insufficient documentation

## 2016-07-15 DIAGNOSIS — Z6841 Body Mass Index (BMI) 40.0 and over, adult: Secondary | ICD-10-CM | POA: Diagnosis not present

## 2016-07-15 HISTORY — PX: CATARACT EXTRACTION W/PHACO: SHX586

## 2016-07-15 HISTORY — DX: Sleep apnea, unspecified: G47.30

## 2016-07-15 SURGERY — PHACOEMULSIFICATION, CATARACT, WITH IOL INSERTION
Anesthesia: Monitor Anesthesia Care | Site: Eye | Laterality: Right | Wound class: Clean

## 2016-07-15 MED ORDER — TIMOLOL MALEATE 0.5 % OP SOLN
OPHTHALMIC | Status: DC | PRN
  Administered 2016-07-15: 1 [drp] via OPHTHALMIC

## 2016-07-15 MED ORDER — EPINEPHRINE PF 1 MG/ML IJ SOLN
INTRAOCULAR | Status: DC | PRN
  Administered 2016-07-15: 55 mL via OPHTHALMIC

## 2016-07-15 MED ORDER — BRIMONIDINE TARTRATE 0.2 % OP SOLN
OPHTHALMIC | Status: DC | PRN
  Administered 2016-07-15: 1 [drp] via OPHTHALMIC

## 2016-07-15 MED ORDER — FENTANYL CITRATE (PF) 100 MCG/2ML IJ SOLN
INTRAMUSCULAR | Status: DC | PRN
  Administered 2016-07-15: 100 ug via INTRAVENOUS

## 2016-07-15 MED ORDER — BALANCED SALT IO SOLN
INTRAOCULAR | Status: DC | PRN
  Administered 2016-07-15: 1.5 mL via OPHTHALMIC

## 2016-07-15 MED ORDER — MOXIFLOXACIN HCL 0.5 % OP SOLN
1.0000 [drp] | OPHTHALMIC | Status: DC | PRN
  Administered 2016-07-15 (×3): 1 [drp] via OPHTHALMIC

## 2016-07-15 MED ORDER — NA HYALUR & NA CHOND-NA HYALUR 0.4-0.35 ML IO KIT
PACK | INTRAOCULAR | Status: DC | PRN
  Administered 2016-07-15: 1 mL via INTRAOCULAR

## 2016-07-15 MED ORDER — ARMC OPHTHALMIC DILATING DROPS
1.0000 "application " | OPHTHALMIC | Status: DC | PRN
  Administered 2016-07-15 (×3): 1 via OPHTHALMIC

## 2016-07-15 MED ORDER — LACTATED RINGERS IV SOLN: INTRAVENOUS | Status: DC

## 2016-07-15 MED ORDER — CEFUROXIME OPHTHALMIC INJECTION 1 MG/0.1 ML
INJECTION | OPHTHALMIC | Status: DC | PRN
  Administered 2016-07-15: .3 mL via INTRACAMERAL

## 2016-07-15 MED ORDER — MIDAZOLAM HCL 2 MG/2ML IJ SOLN
INTRAMUSCULAR | Status: DC | PRN
  Administered 2016-07-15 (×2): 2 mg via INTRAVENOUS

## 2016-07-15 SURGICAL SUPPLY — 27 items
CANNULA ANT/CHMB 27GA (MISCELLANEOUS) ×2 IMPLANT
CARTRIDGE ABBOTT (MISCELLANEOUS) IMPLANT
GLOVE SURG LX 7.5 STRW (GLOVE) ×1
GLOVE SURG LX STRL 7.5 STRW (GLOVE) ×1 IMPLANT
GLOVE SURG TRIUMPH 8.0 PF LTX (GLOVE) ×2 IMPLANT
GOWN STRL REUS W/ TWL LRG LVL3 (GOWN DISPOSABLE) ×2 IMPLANT
GOWN STRL REUS W/TWL LRG LVL3 (GOWN DISPOSABLE) ×2
LENS IOL ACRSF IQ TRC 6 25.5 (Intraocular Lens) ×1 IMPLANT
LENS IOL ACRYSOF IQ TORIC 25.5 (Intraocular Lens) ×1 IMPLANT
LENS IOL IQ TORIC 6 25.5 (Intraocular Lens) ×1 IMPLANT
MARKER SKIN DUAL TIP RULER LAB (MISCELLANEOUS) ×2 IMPLANT
NDL RETROBULBAR .5 NSTRL (NEEDLE) IMPLANT
NEEDLE FILTER BLUNT 18X 1/2SAF (NEEDLE) ×1
NEEDLE FILTER BLUNT 18X1 1/2 (NEEDLE) ×1 IMPLANT
PACK CATARACT BRASINGTON (MISCELLANEOUS) ×2 IMPLANT
PACK EYE AFTER SURG (MISCELLANEOUS) ×2 IMPLANT
PACK OPTHALMIC (MISCELLANEOUS) ×2 IMPLANT
RING MALYGIN 7.0 (MISCELLANEOUS) IMPLANT
SUT ETHILON 10-0 CS-B-6CS-B-6 (SUTURE)
SUT VICRYL  9 0 (SUTURE)
SUT VICRYL 9 0 (SUTURE) IMPLANT
SUTURE EHLN 10-0 CS-B-6CS-B-6 (SUTURE) IMPLANT
SYR 3ML LL SCALE MARK (SYRINGE) ×2 IMPLANT
SYR 5ML LL (SYRINGE) ×2 IMPLANT
SYR TB 1ML LUER SLIP (SYRINGE) ×2 IMPLANT
WATER STERILE IRR 250ML POUR (IV SOLUTION) ×2 IMPLANT
WIPE NON LINTING 3.25X3.25 (MISCELLANEOUS) ×2 IMPLANT

## 2016-07-15 NOTE — Anesthesia Procedure Notes (Signed)
Procedure Name: MAC Date/Time: 07/15/2016 9:54 AM Performed by: Janna Arch Pre-anesthesia Checklist: Patient identified, Emergency Drugs available, Suction available and Patient being monitored Patient Re-evaluated:Patient Re-evaluated prior to inductionOxygen Delivery Method: Nasal cannula

## 2016-07-15 NOTE — H&P (Signed)
The History and Physical notes are on paper, have been signed, and are to be scanned. The patient remains stable and unchanged from the H&P.   Previous H&P reviewed, patient examined, and there are no changes.  [facility] 07/15/2016 8:56 AM

## 2016-07-15 NOTE — Op Note (Signed)
LOCATION:  [place]   PREOPERATIVE DIAGNOSIS:  Nuclear sclerotic cataract of the right eye.  H25.11   POSTOPERATIVE DIAGNOSIS:  Nuclear sclerotic cataract of the right eye.   PROCEDURE:  Phacoemulsification with Toric posterior chamber intraocular lens placement of the right eye.   LENS:   Implant Name Type Inv. Item Serial No. Manufacturer Lot No. LRB No. Used  IOL toric lens Intraocular Lens   [id number] ALCON   Right 1     [id number] 25.5 D Toric intraocular lens with 3.75 diopters of cylindrical power with axis orientation at 94 degrees.   ULTRASOUND TIME: 17 % of 0 minutes, 56 seconds.  CDE 9.4   SURGEON:  Wyonia Hough, MD   ANESTHESIA: Topical with tetracaine drops and 2% Xylocaine jelly, augmented with 1% preservative-free intracameral lidocaine. .   COMPLICATIONS:  None.   DESCRIPTION OF PROCEDURE:  The patient was identified in the holding room and transported to the operating suite and placed in the supine position under the operating microscope.  The right eye was identified as the operative eye, and it was prepped and draped in the usual sterile ophthalmic fashion.    A clear-corneal paracentesis incision was made at the 12:00 position.  0.5 ml of preservative-free 1% lidocaine was injected into the anterior chamber. The anterior chamber was filled with Viscoat.  A 2.4 millimeter near clear corneal incision was then made at the 9:00 position.  A cystotome and capsulorrhexis forceps were then used to make a curvilinear capsulorrhexis.  Hydrodissection and hydrodelineation were then performed using balanced salt solution.   Phacoemulsification was then used in stop and chop fashion to remove the lens, nucleus and epinucleus.  The remaining cortex was aspirated using the irrigation and aspiration handpiece.  Provisc viscoelastic was then placed into the capsular bag to distend it for lens placement.  The Verion digital marker was used to align the implant at  the intended axis.   A Toric lens was then injected into the capsular bag.  It was rotated clockwise until the axis marks on the lens were approximately 15 degrees in the counterclockwise direction to the intended alignment.  The viscoelastic was aspirated from the eye using the irrigation aspiration handpiece.  Then, a Koch spatula through the sideport incision was used to rotate the lens in a clockwise direction until the axis markings of the intraocular lens were lined up with the Verion alignment.  Balanced salt solution was then used to hydrate the wounds. Cefuroxime 0.1 ml of a 10mg /ml solution was injected into the anterior chamber for a dose of 1 mg of intracameral antibiotic at the completion of the case.    The eye was noted to have a physiologic pressure and there was no wound leak noted.   Timolol and Brimonidine drops were applied to the eye.  The patient was taken to the recovery room in stable condition having had no complications of anesthesia or surgery.  Rosie Golson 07/15/2016, 10:17 AM

## 2016-07-15 NOTE — Transfer of Care (Signed)
Immediate Anesthesia Transfer of Care Note  Patient: Aaron [facility]  Procedure(s) Performed: Procedure(s) with comments: CATARACT EXTRACTION PHACO AND INTRAOCULAR LENS PLACEMENT (IOC) (Right) - TORIC sleep apnea  Patient Location: PACU  Anesthesia Type: MAC  Level of Consciousness: awake, alert  and patient cooperative  Airway and Oxygen Therapy: Patient Spontanous Breathing and Patient connected to supplemental oxygen  Post-op Assessment: Post-op Vital signs reviewed, Patient's Cardiovascular Status Stable, Respiratory Function Stable, Patent Airway and No signs of Nausea or vomiting  Post-op Vital Signs: Reviewed and stable  Complications: No apparent anesthesia complications

## 2016-07-15 NOTE — Anesthesia Postprocedure Evaluation (Signed)
Anesthesia Post Note  Patient: Aaron [facility]  Procedure(s) Performed: Procedure(s) (LRB): CATARACT EXTRACTION PHACO AND INTRAOCULAR LENS PLACEMENT (IOC) (Right)  Patient location during evaluation: PACU Anesthesia Type: MAC Level of consciousness: awake and alert and oriented Pain management: satisfactory to patient Vital Signs Assessment: post-procedure vital signs reviewed and stable Respiratory status: spontaneous breathing, nonlabored ventilation and respiratory function stable Cardiovascular status: blood pressure returned to baseline and stable Postop Assessment: Adequate PO intake and No signs of nausea or vomiting Anesthetic complications: no    Raliegh Ip

## 2016-07-15 NOTE — Anesthesia Preprocedure Evaluation (Signed)
Anesthesia Evaluation  Patient identified by MRN, date of birth, ID band Patient awake    Reviewed: Allergy & Precautions, H&P , NPO status , Patient's Chart, lab work & pertinent test results  Airway Mallampati: II  TM Distance: >3 FB Neck ROM: full    Dental no notable dental hx.    Pulmonary sleep apnea , former smoker,    Pulmonary exam normal        Cardiovascular hypertension, Normal cardiovascular exam     Neuro/Psych    GI/Hepatic   Endo/Other  Morbid obesity  Renal/GU      Musculoskeletal   Abdominal   Peds  Hematology   Anesthesia Other Findings   Reproductive/Obstetrics                             Anesthesia Physical  Anesthesia Plan  ASA: III  Anesthesia Plan: MAC   Post-op Pain Management:    Induction:   Airway Management Planned:   Additional Equipment:   Intra-op Plan:   Post-operative Plan:   Informed Consent: I have reviewed the patients History and Physical, chart, labs and discussed the procedure including the risks, benefits and alternatives for the proposed anesthesia with the patient or authorized representative who has indicated his/her understanding and acceptance.     Plan Discussed with:   Anesthesia Plan Comments:         Anesthesia Quick Evaluation  

## 2016-07-16 ENCOUNTER — Encounter: Payer: Self-pay | Admitting: Ophthalmology

## 2016-07-17 DIAGNOSIS — H2512 Age-related nuclear cataract, left eye: Secondary | ICD-10-CM | POA: Diagnosis not present

## 2016-07-21 ENCOUNTER — Other Ambulatory Visit: Payer: Self-pay | Admitting: Family Medicine

## 2016-07-21 ENCOUNTER — Ambulatory Visit: Payer: PPO | Admitting: Family Medicine

## 2016-07-26 DIAGNOSIS — G4733 Obstructive sleep apnea (adult) (pediatric): Secondary | ICD-10-CM | POA: Diagnosis not present

## 2016-07-29 ENCOUNTER — Encounter: Payer: Self-pay | Admitting: *Deleted

## 2016-08-04 NOTE — Discharge Instructions (Signed)
Cataract Surgery, Care After °Refer to this sheet in the next few weeks. These instructions provide you with information about caring for yourself after your procedure. Your health care provider may also give you more specific instructions. Your treatment has been planned according to current medical practices, but problems sometimes occur. Call your health care provider if you have any problems or questions after your procedure. °What can I expect after the procedure? °After the procedure, it is common to have: °· Itching. °· Discomfort. °· Fluid discharge. °· Sensitivity to light and to touch. °· Bruising. °Follow these instructions at home: °Eye Care  °· Check your eye every day for signs of infection. Watch for: °¨ Redness, swelling, or pain. °¨ Fluid, blood, or pus. °¨ Warmth. °¨ Bad smell. °Activity  °· Avoid strenuous activities, such as playing contact sports, for as long as told by your health care provider. °· Do not drive or operate heavy machinery until your health care provider approves. °· Do not bend or lift heavy objects . Bending increases pressure in the eye. You can walk, climb stairs, and do light household chores. °· Ask your health care provider when you can return to work. If you work in a dusty environment, you may be advised to wear protective eyewear for a period of time. °General instructions  °· Take or apply over-the-counter and prescription medicines only as told by your health care provider. This includes eye drops. °· Do not touch or rub your eyes. °· If you were given a protective shield, wear it as told by your health care provider. If you were not given a protective shield, wear sunglasses as told by your health care provider to protect your eyes. °· Keep the area around your eye clean and dry. Avoid swimming or allowing water to hit you directly in the face while showering until told by your health care provider. Keep soap and shampoo out of your eyes. °· Do not put a contact lens  into the affected eye or eyes until your health care provider approves. °· Keep all follow-up visits as told by your health care provider. This is important. °Contact a health care provider if: ° °· You have increased bruising around your eye. °· You have pain that is not helped with medicine. °· You have a fever. °· You have redness, swelling, or pain in your eye. °· You have fluid, blood, or pus coming from your incision. °· Your vision gets worse. °Get help right away if: °· You have sudden vision loss. °This information is not intended to replace advice given to you by your health care provider. Make sure you discuss any questions you have with your health care provider. °Document Released: 01/30/2005 Document Revised: 11/21/2015 Document Reviewed: 05/23/2015 °Elsevier Interactive Patient Education © 2017 Elsevier Inc. ° ° ° ° °General Anesthesia, Adult, Care After °These instructions provide you with information about caring for yourself after your procedure. Your health care provider may also give you more specific instructions. Your treatment has been planned according to current medical practices, but problems sometimes occur. Call your health care provider if you have any problems or questions after your procedure. °What can I expect after the procedure? °After the procedure, it is common to have: °· Vomiting. °· A sore throat. °· Mental slowness. °It is common to feel: °· Nauseous. °· Cold or shivery. °· Sleepy. °· Tired. °· Sore or achy, even in parts of your body where you did not have surgery. °Follow these instructions at   home: °For at least 24 hours after the procedure:  °· Do not: °¨ Participate in activities where you could fall or become injured. °¨ Drive. °¨ Use heavy machinery. °¨ Drink alcohol. °¨ Take sleeping pills or medicines that cause drowsiness. °¨ Make important decisions or sign legal documents. °¨ Take care of children on your own. °· Rest. °Eating and drinking  °· If you vomit, drink  water, juice, or soup when you can drink without vomiting. °· Drink enough fluid to keep your urine clear or pale yellow. °· Make sure you have little or no nausea before eating solid foods. °· Follow the diet recommended by your health care provider. °General instructions  °· Have a responsible adult stay with you until you are awake and alert. °· Return to your normal activities as told by your health care provider. Ask your health care provider what activities are safe for you. °· Take over-the-counter and prescription medicines only as told by your health care provider. °· If you smoke, do not smoke without supervision. °· Keep all follow-up visits as told by your health care provider. This is important. °Contact a health care provider if: °· You continue to have nausea or vomiting at home, and medicines are not helpful. °· You cannot drink fluids or start eating again. °· You cannot urinate after 8-12 hours. °· You develop a skin rash. °· You have fever. °· You have increasing redness at the site of your procedure. °Get help right away if: °· You have difficulty breathing. °· You have chest pain. °· You have unexpected bleeding. °· You feel that you are having a life-threatening or urgent problem. °This information is not intended to replace advice given to you by your health care provider. Make sure you discuss any questions you have with your health care provider. °Document Released: 10/19/2000 Document Revised: 12/16/2015 Document Reviewed: 06/27/2015 °Elsevier Interactive Patient Education © 2017 Elsevier Inc. ° °

## 2016-08-05 ENCOUNTER — Ambulatory Visit: Payer: PPO | Admitting: Anesthesiology

## 2016-08-05 ENCOUNTER — Ambulatory Visit
Admission: RE | Admit: 2016-08-05 | Discharge: 2016-08-05 | Disposition: A | Discharge status: Home / Self Care | Payer: PPO | Source: Ambulatory Visit | Attending: Ophthalmology | Admitting: Ophthalmology

## 2016-08-05 ENCOUNTER — Encounter
Admission: RE | Disposition: A | Discharge status: Home / Self Care | Payer: Self-pay | Source: Ambulatory Visit | Attending: Ophthalmology

## 2016-08-05 DIAGNOSIS — E669 Obesity, unspecified: Secondary | ICD-10-CM | POA: Diagnosis not present

## 2016-08-05 DIAGNOSIS — Z6841 Body Mass Index (BMI) 40.0 and over, adult: Secondary | ICD-10-CM | POA: Insufficient documentation

## 2016-08-05 DIAGNOSIS — H2512 Age-related nuclear cataract, left eye: Secondary | ICD-10-CM | POA: Diagnosis not present

## 2016-08-05 DIAGNOSIS — I10 Essential (primary) hypertension: Secondary | ICD-10-CM | POA: Diagnosis not present

## 2016-08-05 DIAGNOSIS — G4730 Sleep apnea, unspecified: Secondary | ICD-10-CM | POA: Diagnosis not present

## 2016-08-05 DIAGNOSIS — Z87891 Personal history of nicotine dependence: Secondary | ICD-10-CM | POA: Insufficient documentation

## 2016-08-05 HISTORY — PX: CATARACT EXTRACTION W/PHACO: SHX586

## 2016-08-05 SURGERY — PHACOEMULSIFICATION, CATARACT, WITH IOL INSERTION
Anesthesia: Monitor Anesthesia Care | Site: Eye | Laterality: Left | Wound class: Clean

## 2016-08-05 MED ORDER — BRIMONIDINE TARTRATE-TIMOLOL 0.2-0.5 % OP SOLN
OPHTHALMIC | Status: DC | PRN
  Administered 2016-08-05: 1 [drp] via OPHTHALMIC

## 2016-08-05 MED ORDER — MOXIFLOXACIN HCL 0.5 % OP SOLN
1.0000 [drp] | OPHTHALMIC | Status: DC | PRN
  Administered 2016-08-05 (×3): 1 [drp] via OPHTHALMIC

## 2016-08-05 MED ORDER — FENTANYL CITRATE (PF) 100 MCG/2ML IJ SOLN
INTRAMUSCULAR | Status: DC | PRN
  Administered 2016-08-05: 100 ug via INTRAVENOUS

## 2016-08-05 MED ORDER — NA HYALUR & NA CHOND-NA HYALUR 0.4-0.35 ML IO KIT
PACK | INTRAOCULAR | Status: DC | PRN
  Administered 2016-08-05: 1 mL via INTRAOCULAR

## 2016-08-05 MED ORDER — LIDOCAINE HCL (PF) 4 % IJ SOLN
INTRAOCULAR | Status: DC | PRN
  Administered 2016-08-05: .5 mL via OPHTHALMIC

## 2016-08-05 MED ORDER — ARMC OPHTHALMIC DILATING DROPS
1.0000 "application " | OPHTHALMIC | Status: DC | PRN
  Administered 2016-08-05 (×3): 1 via OPHTHALMIC

## 2016-08-05 MED ORDER — MIDAZOLAM HCL 2 MG/2ML IJ SOLN
INTRAMUSCULAR | Status: DC | PRN
  Administered 2016-08-05: 2 mg via INTRAVENOUS

## 2016-08-05 MED ORDER — EPINEPHRINE PF 1 MG/ML IJ SOLN
INTRAOCULAR | Status: DC | PRN
  Administered 2016-08-05: 47 mL via OPHTHALMIC

## 2016-08-05 MED ORDER — LACTATED RINGERS IV SOLN: INTRAVENOUS | Status: DC

## 2016-08-05 MED ORDER — CEFUROXIME OPHTHALMIC INJECTION 1 MG/0.1 ML
INJECTION | OPHTHALMIC | Status: DC | PRN
  Administered 2016-08-05: 0.1 mL via OPHTHALMIC

## 2016-08-05 SURGICAL SUPPLY — 27 items
CANNULA ANT/CHMB 27GA (MISCELLANEOUS) ×2 IMPLANT
CARTRIDGE ABBOTT (MISCELLANEOUS) IMPLANT
GLOVE SURG LX 7.5 STRW (GLOVE) ×2
GLOVE SURG LX STRL 7.5 STRW (GLOVE) ×2 IMPLANT
GLOVE SURG TRIUMPH 8.0 PF LTX (GLOVE) ×2 IMPLANT
GOWN STRL REUS W/ TWL LRG LVL3 (GOWN DISPOSABLE) ×2 IMPLANT
GOWN STRL REUS W/TWL LRG LVL3 (GOWN DISPOSABLE) ×2
LENS IOL ACRSF IQ TRC 6 25.0 (Intraocular Lens) ×1 IMPLANT
LENS IOL ACRYSOF IQ TORIC 25.0 (Intraocular Lens) ×1 IMPLANT
LENS IOL IQ TORIC 6 25.0 (Intraocular Lens) ×1 IMPLANT
MARKER SKIN DUAL TIP RULER LAB (MISCELLANEOUS) ×2 IMPLANT
NDL RETROBULBAR .5 NSTRL (NEEDLE) IMPLANT
NEEDLE FILTER BLUNT 18X 1/2SAF (NEEDLE) ×1
NEEDLE FILTER BLUNT 18X1 1/2 (NEEDLE) ×1 IMPLANT
PACK CATARACT BRASINGTON (MISCELLANEOUS) ×2 IMPLANT
PACK EYE AFTER SURG (MISCELLANEOUS) ×2 IMPLANT
PACK OPTHALMIC (MISCELLANEOUS) ×2 IMPLANT
RING MALYGIN 7.0 (MISCELLANEOUS) IMPLANT
SUT ETHILON 10-0 CS-B-6CS-B-6 (SUTURE)
SUT VICRYL  9 0 (SUTURE)
SUT VICRYL 9 0 (SUTURE) IMPLANT
SUTURE EHLN 10-0 CS-B-6CS-B-6 (SUTURE) IMPLANT
SYR 3ML LL SCALE MARK (SYRINGE) ×2 IMPLANT
SYR 5ML LL (SYRINGE) ×2 IMPLANT
SYR TB 1ML LUER SLIP (SYRINGE) ×2 IMPLANT
WATER STERILE IRR 250ML POUR (IV SOLUTION) ×2 IMPLANT
WIPE NON LINTING 3.25X3.25 (MISCELLANEOUS) ×2 IMPLANT

## 2016-08-05 NOTE — Anesthesia Postprocedure Evaluation (Signed)
Anesthesia Post Note  Patient: Aaron Mitchell  Procedure(s) Performed: Procedure(s) (LRB): CATARACT EXTRACTION PHACO AND INTRAOCULAR LENS PLACEMENT (IOC) toric (Left)  Patient location during evaluation: PACU Anesthesia Type: MAC Level of consciousness: awake and alert Pain management: pain level controlled Vital Signs Assessment: post-procedure vital signs reviewed and stable Respiratory status: spontaneous breathing, nonlabored ventilation, respiratory function stable and patient connected to nasal cannula oxygen Cardiovascular status: stable and blood pressure returned to baseline Anesthetic complications: no    Marshell Levan

## 2016-08-05 NOTE — Anesthesia Preprocedure Evaluation (Signed)
Anesthesia Evaluation  Patient identified by MRN, date of birth, ID band Patient awake    Reviewed: Allergy & Precautions, H&P , NPO status , Patient's Chart, lab work & pertinent test results  Airway Mallampati: II  TM Distance: >3 FB Neck ROM: full    Dental no notable dental hx.    Pulmonary sleep apnea , former smoker,    Pulmonary exam normal        Cardiovascular hypertension, Normal cardiovascular exam     Neuro/Psych    GI/Hepatic   Endo/Other  Morbid obesity  Renal/GU      Musculoskeletal   Abdominal   Peds  Hematology   Anesthesia Other Findings   Reproductive/Obstetrics                             Anesthesia Physical  Anesthesia Plan  ASA: III  Anesthesia Plan: MAC   Post-op Pain Management:    Induction:   Airway Management Planned:   Additional Equipment:   Intra-op Plan:   Post-operative Plan:   Informed Consent: I have reviewed the patients History and Physical, chart, labs and discussed the procedure including the risks, benefits and alternatives for the proposed anesthesia with the patient or authorized representative who has indicated his/her understanding and acceptance.     Plan Discussed with:   Anesthesia Plan Comments:         Anesthesia Quick Evaluation

## 2016-08-05 NOTE — Anesthesia Procedure Notes (Signed)
Procedure Name: MAC Date/Time: 08/05/2016 11:08 AM Performed by: Janna Arch Pre-anesthesia Checklist: Patient identified, Emergency Drugs available, Suction available and Patient being monitored Patient Re-evaluated:Patient Re-evaluated prior to inductionOxygen Delivery Method: Nasal cannula

## 2016-08-05 NOTE — H&P (Signed)
The History and Physical notes are on paper, have been signed, and are to be scanned. The patient remains stable and unchanged from the H&P.   Previous H&P reviewed, patient examined, and there are no changes.  Aaron Mitchell 08/05/2016 10:00 AM

## 2016-08-05 NOTE — Transfer of Care (Signed)
Immediate Anesthesia Transfer of Care Note  Patient: Aaron Mitchell  Procedure(s) Performed: Procedure(s) with comments: CATARACT EXTRACTION PHACO AND INTRAOCULAR LENS PLACEMENT (IOC) toric (Left) - left sleep apnea toric  Patient Location: PACU  Anesthesia Type: MAC  Level of Consciousness: awake, alert  and patient cooperative  Airway and Oxygen Therapy: Patient Spontanous Breathing and Patient connected to supplemental oxygen  Post-op Assessment: Post-op Vital signs reviewed, Patient's Cardiovascular Status Stable, Respiratory Function Stable, Patent Airway and No signs of Nausea or vomiting  Post-op Vital Signs: Reviewed and stable  Complications: No apparent anesthesia complications

## 2016-08-05 NOTE — Op Note (Signed)
LOCATION:  [place]   PREOPERATIVE DIAGNOSIS:  Nuclear sclerotic cataract of the left eye.  H25.12  POSTOPERATIVE DIAGNOSIS:  Nuclear sclerotic cataract of the left eye.   PROCEDURE:  Phacoemulsification with Toric posterior chamber intraocular lens placement of the left eye.   LENS:  Implant Name Type Inv. Item Serial No. Manufacturer Lot No. LRB No. Used  [id number] toric lens Intraocular Lens   [id number] ALCON   Left 1   [id number] 256.0 D Toric intraocular lens with 3.75 diopters of cylindrical power with axis orientation at 83 degrees.   ULTRASOUND TIME: 18 % of 0 minutes, 42 seconds.  CDE 7.7   SURGEON:  Wyonia Hough, MD   ANESTHESIA:  Topical with tetracaine drops and 2% Xylocaine jelly, augmented with 1% preservative-free intracameral lidocaine.  COMPLICATIONS:  None.   DESCRIPTION OF PROCEDURE:  The patient was identified in the holding room and transported to the operating suite and placed in the supine position under the operating microscope.  The left eye was identified as the operative eye, and it was prepped and draped in the usual sterile ophthalmic fashion.    A clear-corneal paracentesis incision was made at the 1:30 position.  0.5 ml of preservative-free 1% lidocaine was injected into the anterior chamber. The anterior chamber was filled with Viscoat.  A 2.4 millimeter near clear corneal incision was then made at the 10:30 position.  A cystotome and capsulorrhexis forceps were then used to make a curvilinear capsulorrhexis.  Hydrodissection and hydrodelineation were then performed using balanced salt solution.   Phacoemulsification was then used in stop and chop fashion to remove the lens, nucleus and epinucleus.  The remaining cortex was aspirated using the irrigation and aspiration handpiece.  Provisc viscoelastic was then placed into the capsular bag to distend it for lens placement.  The Verion digital marker was used to align the implant at the  intended axis.   A 25.0 diopter lens was then injected into the capsular bag.  It was rotated clockwise until the axis marks on the lens were approximately 15 degrees in the counterclockwise direction to the intended alignment.  The viscoelastic was aspirated from the eye using the irrigation aspiration handpiece.  Then, a Koch spatula through the sideport incision was used to rotate the lens in a clockwise direction until the axis markings of the intraocular lens were lined up with the Verion alignment.  Balanced salt solution was then used to hydrate the wounds. Cefuroxime 0.1 ml of a 10mg /ml solution was injected into the anterior chamber for a dose of 1 mg of intracameral antibiotic at the completion of the case.    The eye was noted to have a physiologic pressure and there was no wound leak noted.   Timolol and Brimonidine drops were applied to the eye.  The patient was taken to the recovery room in stable condition having had no complications of anesthesia or surgery.  Betty Brooks 08/05/2016, 11:31 AM

## 2016-08-07 ENCOUNTER — Encounter: Payer: Self-pay | Admitting: Ophthalmology

## 2016-08-10 ENCOUNTER — Telehealth: Payer: Self-pay | Admitting: *Deleted

## 2016-08-10 NOTE — Telephone Encounter (Signed)
Spoke with patient and he said that Dr. Raul Del only read the sleep study results. He said records should be in [place]. If not, he can bring you a copy. He said the mask actually blows off of his face at night because the pressure is so strong. It is currently at 50 and he thinks it needs to be at 12.

## 2016-08-10 NOTE — Telephone Encounter (Signed)
Patient left a voicemail stating that he needs help with his Cpap machine. Patient stated that the pressures are to high and it needs to be reduced. Patient wants to know how to go about getting this done?

## 2016-08-10 NOTE — Telephone Encounter (Signed)
I don't have records of his home sleep study and I believe he is seeing Dr Raul Del for OSA. Recommend contact Dr Gust Brooms office for titration of CPAP machine as I usually leave OSA management to sleep specialist.

## 2016-08-13 NOTE — Telephone Encounter (Signed)
I found it under our media section (05/01/2016) . Split study thought optimal pressure was 14 cm H2O for nasal CPAP but we can try 12 - Rx written and in Kim's box, plz fax to [facility]. Let us know how this helps - if no better, we can refer to our sleep doctors.

## 2016-08-17 NOTE — Telephone Encounter (Signed)
Patient notified and order faxed

## 2016-08-18 ENCOUNTER — Other Ambulatory Visit: Payer: Self-pay | Admitting: Family Medicine

## 2016-08-26 DIAGNOSIS — G4733 Obstructive sleep apnea (adult) (pediatric): Secondary | ICD-10-CM | POA: Diagnosis not present

## 2016-08-30 NOTE — Telephone Encounter (Signed)
Received, reviewed excellent compliance report from [facility] in [place]. Average therapy 7hrs 34 min per night. Scanned into chart, faxed back.

## 2016-08-31 ENCOUNTER — Encounter: Payer: Self-pay | Admitting: Family Medicine

## 2016-08-31 DIAGNOSIS — Z1159 Encounter for screening for other viral diseases: Secondary | ICD-10-CM | POA: Diagnosis not present

## 2016-08-31 DIAGNOSIS — N529 Male erectile dysfunction, unspecified: Secondary | ICD-10-CM | POA: Diagnosis not present

## 2016-08-31 DIAGNOSIS — E291 Testicular hypofunction: Secondary | ICD-10-CM | POA: Diagnosis not present

## 2016-08-31 DIAGNOSIS — N138 Other obstructive and reflux uropathy: Secondary | ICD-10-CM | POA: Diagnosis not present

## 2016-08-31 DIAGNOSIS — Z79899 Other long term (current) drug therapy: Secondary | ICD-10-CM | POA: Diagnosis not present

## 2016-08-31 DIAGNOSIS — N401 Enlarged prostate with lower urinary tract symptoms: Secondary | ICD-10-CM | POA: Diagnosis not present

## 2016-09-09 DIAGNOSIS — G4733 Obstructive sleep apnea (adult) (pediatric): Secondary | ICD-10-CM | POA: Diagnosis not present

## 2016-09-14 ENCOUNTER — Ambulatory Visit (INDEPENDENT_AMBULATORY_CARE_PROVIDER_SITE_OTHER): Payer: PPO | Admitting: Family Medicine

## 2016-09-14 ENCOUNTER — Encounter: Payer: Self-pay | Admitting: Family Medicine

## 2016-09-14 ENCOUNTER — Ambulatory Visit: Payer: PPO | Admitting: Family Medicine

## 2016-09-14 VITALS — BP 120/70 | HR 77 | Temp 98.7°F | Ht 69.0 in | Wt 274.8 lb

## 2016-09-14 DIAGNOSIS — M8949 Other hypertrophic osteoarthropathy, multiple sites: Secondary | ICD-10-CM

## 2016-09-14 DIAGNOSIS — M5126 Other intervertebral disc displacement, lumbar region: Secondary | ICD-10-CM | POA: Diagnosis not present

## 2016-09-14 DIAGNOSIS — M150 Primary generalized (osteo)arthritis: Secondary | ICD-10-CM

## 2016-09-14 DIAGNOSIS — M1A00X0 Idiopathic chronic gout, unspecified site, without tophus (tophi): Secondary | ICD-10-CM

## 2016-09-14 DIAGNOSIS — E785 Hyperlipidemia, unspecified: Secondary | ICD-10-CM

## 2016-09-14 DIAGNOSIS — R011 Cardiac murmur, unspecified: Secondary | ICD-10-CM

## 2016-09-14 DIAGNOSIS — R7303 Prediabetes: Secondary | ICD-10-CM

## 2016-09-14 DIAGNOSIS — I10 Essential (primary) hypertension: Secondary | ICD-10-CM | POA: Diagnosis not present

## 2016-09-14 DIAGNOSIS — M159 Polyosteoarthritis, unspecified: Secondary | ICD-10-CM

## 2016-09-14 DIAGNOSIS — M5416 Radiculopathy, lumbar region: Secondary | ICD-10-CM | POA: Diagnosis not present

## 2016-09-14 DIAGNOSIS — I340 Nonrheumatic mitral (valve) insufficiency: Secondary | ICD-10-CM | POA: Insufficient documentation

## 2016-09-14 DIAGNOSIS — M5136 Other intervertebral disc degeneration, lumbar region: Secondary | ICD-10-CM | POA: Diagnosis not present

## 2016-09-14 DIAGNOSIS — G4733 Obstructive sleep apnea (adult) (pediatric): Secondary | ICD-10-CM

## 2016-09-14 DIAGNOSIS — M48062 Spinal stenosis, lumbar region with neurogenic claudication: Secondary | ICD-10-CM | POA: Diagnosis not present

## 2016-09-14 MED ORDER — AMLODIPINE BESYLATE 5 MG PO TABS: 5.0000 mg | ORAL_TABLET | Freq: Two times a day (BID) | ORAL | 3 refills | Status: DC

## 2016-09-14 MED ORDER — ALLOPURINOL 300 MG PO TABS: ORAL_TABLET | ORAL | 3 refills | Status: DC

## 2016-09-14 MED ORDER — LEVOCETIRIZINE DIHYDROCHLORIDE 5 MG PO TABS: 5.0000 mg | ORAL_TABLET | Freq: Every evening | ORAL | 3 refills | Status: DC

## 2016-09-14 MED ORDER — METAXALONE 800 MG PO TABS: 800.0000 mg | ORAL_TABLET | Freq: Three times a day (TID) | ORAL | 0 refills | Status: DC | PRN

## 2016-09-14 MED ORDER — LISINOPRIL-HYDROCHLOROTHIAZIDE 20-12.5 MG PO TABS: 1.0000 | ORAL_TABLET | Freq: Every day | ORAL | 3 refills | Status: DC

## 2016-09-14 NOTE — Assessment & Plan Note (Signed)
Update dLDL today - pt not fasting.

## 2016-09-14 NOTE — Assessment & Plan Note (Signed)
He is undergoing next 56 day plan by recommendation of daughter, and last lost about 15 lbs in last few months. Congratulated on weight loss. He doesn't think this is sustainable. Reviewed healthy diet changes.

## 2016-09-14 NOTE — Assessment & Plan Note (Signed)
On allopurinol 300mg  daily. Check urate.

## 2016-09-14 NOTE — Assessment & Plan Note (Signed)
Chronic, longstanding.  Uses NSAID and skelaxin muscle relaxant. Rarely uses narcotic.

## 2016-09-14 NOTE — Progress Notes (Signed)
Pre visit review using our clinic review tool, if applicable. No additional management support is needed unless otherwise documented below in the visit note. 

## 2016-09-14 NOTE — Assessment & Plan Note (Addendum)
Update A1c. Off meds.

## 2016-09-14 NOTE — Progress Notes (Signed)
BP 120/70   Pulse 77   Temp 98.7 F (37.1 C) (Oral)   Ht 5\' 9"  (1.753 m)   Wt 274 lb 12.8 oz (124.6 kg)   BMI 40.58 kg/m    CC: 4 mo f/u visit Subjective:    Patient ID: Aaron Mitchell, male    DOB: [birth year], 71 y.o.   MRN: [id number]  HPI: Aaron Mitchell is a 71 y.o. male presenting on 09/14/2016 for Follow-up (diabetes)   Established last year, prior saw Dr Netty Starring.   Had ESI this morning by Dr Sharlet Salina. Non-operable OA from L2 to S1.   OSA on CPAP - now on 12 cm H2O with full face mask CPAP machine. Awakens feeling rested. Nasal mask not effective.   HTN - Compliant with current antihypertensive regimen of amlodipine 5mg  bid, lisinopril hctz 20/12.5mg  daily. Does check blood pressures at home: [id number] systolic. No low blood pressure readings or symptoms of dizziness/syncope. Denies HA, vision changes, SOB, leg swelling. Occasional chest discomfort.  He received bilateral shoulder bursitis injections 05/2016 by Dr Lorelei Pont.  Testosterone prescribed by Dr [name].  Rare hydrocodone or ambien use.   Working on next 56 day [place] for weight loss.   Relevant past medical, surgical, family and social history reviewed and updated as indicated. Interim medical history since our last visit reviewed. Allergies and medications reviewed and updated. Outpatient Medications Prior to Visit  Medication Sig Dispense Refill  . aspirin EC 81 MG tablet Take 81 mg by mouth daily.    . diclofenac (VOLTAREN) 75 MG EC tablet TAKE 1 TABLET BY MOUTH TWICE DAILY AS NEEDED. CAUTION: LONG-TERM USE CAN CAUSE KIDNEY PROBLEMS, RISK OF HEART ATTACK 60 tablet 0  . diclofenac sodium (VOLTAREN) 1 % GEL Apply 2 g topically daily as needed. Apply to both hands.    . fluticasone (FLONASE) 50 MCG/ACT nasal spray Place 2 sprays into both nostrils daily. 16 g 0  . HYDROcodone-acetaminophen (LORTAB) 10-500 MG tablet Take 1 tablet by mouth every 8 (eight) hours as needed for pain.    [name] Kitchen L-Lysine 500 MG TABS Take  500 mg by mouth daily.    . Multiple Vitamins tablet Take 1 tablet by mouth daily.    . naproxen sodium (ANAPROX) 220 MG tablet Take 440 mg by mouth 2 (two) times daily with a meal.    . nitroGLYCERIN (NITRODUR - DOSED IN MG/24 HR) 0.2 mg/hr patch 1/4 patch daily 30 patch 1  . testosterone cypionate (DEPOTESTOSTERONE CYPIONATE) 200 MG/ML injection Inject 100 mg into the muscle every 7 (seven) days. Use on Friday.    . Vitamin D, Ergocalciferol, (DRISDOL) 50000 units CAPS capsule Take 1 capsule (50,000 Units total) by mouth every 7 (seven) days. 12 capsule 0  . zolpidem (AMBIEN) 10 MG tablet Take 10 mg by mouth at bedtime as needed. For sleep.    [name] Kitchen allopurinol (ZYLOPRIM) 300 MG tablet TAKE 1 TABLET(300 MG) BY MOUTH DAILY 90 tablet 1  . amLODipine (NORVASC) 5 MG tablet Take 1 tablet (5 mg total) by mouth 2 (two) times daily. 180 tablet 3  . levocetirizine (XYZAL) 5 MG tablet Take 1 tablet (5 mg total) by mouth every evening. 90 tablet 3  . lisinopril-hydrochlorothiazide (PRINZIDE,ZESTORETIC) 20-12.5 MG tablet Take 1 tablet by mouth daily. 90 tablet 3  . metaxalone (SKELAXIN) 800 MG tablet TAKE 1 TABLET(800 MG) BY MOUTH EVERY 8 HOURS AS NEEDED FOR MUSCLE SPASMS 30 tablet 0   No facility-administered medications prior to visit.  Per HPI unless specifically indicated in ROS section below Review of Systems     Objective:    BP 120/70   Pulse 77   Temp 98.7 F (37.1 C) (Oral)   Ht 5\' 9"  (1.753 m)   Wt 274 lb 12.8 oz (124.6 kg)   BMI 40.58 kg/m   Wt Readings from Last 3 Encounters:  09/14/16 274 lb 12.8 oz (124.6 kg)  08/05/16 278 lb (126.1 kg)  07/15/16 290 lb (131.5 kg)    Physical Exam  Constitutional: He appears well-developed and well-nourished. No distress.  HENT:  Mouth/Throat: Oropharynx is clear and moist. No oropharyngeal exudate.  Eyes: Conjunctivae and EOM are normal. Pupils are equal, round, and reactive to light.  Neck: Normal range of motion. Neck supple. No  thyromegaly present.  Cardiovascular: Normal rate, regular rhythm and intact distal pulses.   Murmur (2/6 SEM) heard. Pulmonary/Chest: Effort normal and breath sounds normal. No respiratory distress. He has no wheezes. He has no rales.  Musculoskeletal: He exhibits no edema.  Lymphadenopathy:    He has no cervical adenopathy.  Skin: Skin is warm and dry. No rash noted.  Psychiatric: He has a normal mood and affect.  Nursing note and vitals reviewed.  Results for orders placed or performed in visit on 01/17/16  HM DIABETES EYE EXAM  Result Value Ref Range   HM Diabetic Eye Exam No Retinopathy No Retinopathy  HM DIABETES EYE EXAM  Result Value Ref Range   HM Diabetic Eye Exam No Retinopathy No Retinopathy      Assessment & Plan:   Problem List Items Addressed This Visit    Borderline diabetes mellitus    Update A1c. Off meds.       Relevant Orders   Hemoglobin A1c   Comprehensive metabolic panel   Chronic gouty arthropathy - Primary    On allopurinol 300mg  daily. Check urate.       Relevant Orders   Uric acid   Essential (primary) hypertension    Chronic, stable. Continue current regimen.       Relevant Medications   amLODipine (NORVASC) 5 MG tablet   lisinopril-hydrochlorothiazide (PRINZIDE,ZESTORETIC) 20-12.5 MG tablet   Other Relevant Orders   Comprehensive metabolic panel   HLD (hyperlipidemia)    Update dLDL today - pt not fasting.       Relevant Medications   amLODipine (NORVASC) 5 MG tablet   lisinopril-hydrochlorothiazide (PRINZIDE,ZESTORETIC) 20-12.5 MG tablet   Other Relevant Orders   LDL Cholesterol, Direct   OSA (obstructive sleep apnea)    Reviewed CPAP use - pt states tolerating 12 cm H2O much better. Uses full face mask.       Osteoarthritis    Chronic, longstanding.  Uses NSAID and skelaxin muscle relaxant. Rarely uses narcotic.       Relevant Medications   allopurinol (ZYLOPRIM) 300 MG tablet   metaxalone (SKELAXIN) 800 MG tablet    Severe obesity (BMI >= 40) (HCC)    He is undergoing next 56 day plan by recommendation of daughter, and last lost about 15 lbs in last few months. Congratulated on weight loss. He doesn't think this is sustainable. Reviewed healthy diet changes.       Systolic murmur    Mild, continue to monitor.          Follow up plan: Return in about 7 months (around 04/14/2017) for annual exam, prior fasting for blood work, medicare wellness visit.  [name], MD

## 2016-09-14 NOTE — Patient Instructions (Addendum)
Labs today. Remember to limit anti inflammatories to one type at a time (aleve or diclofenac).  Good to see you today, call us with questions. You are doing well today. We will continue to monitor mild murmur we hear today.  Return in September for wellness visit.

## 2016-09-14 NOTE — Assessment & Plan Note (Signed)
Chronic, stable. Continue current regimen. 

## 2016-09-14 NOTE — Assessment & Plan Note (Signed)
Mild, continue to monitor.  

## 2016-09-14 NOTE — Assessment & Plan Note (Signed)
Reviewed CPAP use - pt states tolerating 12 cm H2O much better. Uses full face mask.

## 2016-09-15 LAB — COMPREHENSIVE METABOLIC PANEL
ALT: 32 U/L (ref 0–53)
AST: 27 U/L (ref 0–37)
Albumin: 4.4 g/dL (ref 3.5–5.2)
Alkaline Phosphatase: 49 U/L (ref 39–117)
BUN: 16 mg/dL (ref 6–23)
CHLORIDE: 103 meq/L (ref 96–112)
CO2: 21 meq/L (ref 19–32)
CREATININE: 1.19 mg/dL (ref 0.40–1.50)
Calcium: 9.3 mg/dL (ref 8.4–10.5)
GFR: 64.52 mL/min (ref >60.00)
Glucose, Bld: 183 mg/dL — ABNORMAL HIGH (ref 70–99)
POTASSIUM: 4.5 meq/L (ref 3.5–5.1)
SODIUM: 135 meq/L (ref 135–145)
Total Bilirubin: 0.6 mg/dL (ref 0.2–1.2)
Total Protein: 7 g/dL (ref 6.0–8.3)

## 2016-09-15 LAB — URIC ACID: Uric Acid, Serum: 6.3 mg/dL (ref 4.0–7.8)

## 2016-09-15 LAB — HEMOGLOBIN A1C: HEMOGLOBIN A1C: 5.9 % (ref 4.6–6.5)

## 2016-09-15 LAB — LDL CHOLESTEROL, DIRECT: LDL DIRECT: 108 mg/dL

## 2016-09-19 ENCOUNTER — Encounter: Payer: Self-pay | Admitting: Family Medicine

## 2016-09-23 DIAGNOSIS — G4733 Obstructive sleep apnea (adult) (pediatric): Secondary | ICD-10-CM | POA: Diagnosis not present

## 2016-10-24 DIAGNOSIS — G4733 Obstructive sleep apnea (adult) (pediatric): Secondary | ICD-10-CM | POA: Diagnosis not present

## 2016-10-26 ENCOUNTER — Other Ambulatory Visit: Payer: Self-pay | Admitting: Family Medicine

## 2016-10-27 ENCOUNTER — Telehealth: Payer: Self-pay | Admitting: Family Medicine

## 2016-10-27 ENCOUNTER — Other Ambulatory Visit: Payer: Self-pay

## 2016-10-27 MED ORDER — NITROGLYCERIN 0.2 MG/HR TD PT24: MEDICATED_PATCH | TRANSDERMAL | 0 refills | Status: DC

## 2016-10-27 NOTE — Telephone Encounter (Signed)
Pt called stating Aaron Mitchell verbally updated his prescription from 1/4 of a patch of nitroGLYCERIN  to 1/2 but did not update the prescription, Pt is requesting prescription to be updated to 1/2 a patch. Please advise.

## 2016-10-27 NOTE — Telephone Encounter (Signed)
Patient called back.  Gave response.

## 2016-10-27 NOTE — Telephone Encounter (Signed)
Order placed for 1/2 patch with no refills. Must see provider to obtain additional refills.

## 2016-10-27 NOTE — Telephone Encounter (Signed)
Forwarding to Dr. Thompson Caul office

## 2016-11-19 DIAGNOSIS — G4733 Obstructive sleep apnea (adult) (pediatric): Secondary | ICD-10-CM | POA: Diagnosis not present

## 2016-11-23 DIAGNOSIS — G4733 Obstructive sleep apnea (adult) (pediatric): Secondary | ICD-10-CM | POA: Diagnosis not present

## 2016-12-08 ENCOUNTER — Encounter: Payer: Self-pay | Admitting: Family Medicine

## 2016-12-08 ENCOUNTER — Ambulatory Visit (INDEPENDENT_AMBULATORY_CARE_PROVIDER_SITE_OTHER): Payer: PPO | Admitting: Family Medicine

## 2016-12-08 VITALS — BP 124/78 | HR 61 | Temp 98.1°F | Wt 272.2 lb

## 2016-12-08 DIAGNOSIS — R011 Cardiac murmur, unspecified: Secondary | ICD-10-CM

## 2016-12-08 DIAGNOSIS — R7303 Prediabetes: Secondary | ICD-10-CM

## 2016-12-08 DIAGNOSIS — I10 Essential (primary) hypertension: Secondary | ICD-10-CM | POA: Diagnosis not present

## 2016-12-08 DIAGNOSIS — R0989 Other specified symptoms and signs involving the circulatory and respiratory systems: Secondary | ICD-10-CM | POA: Insufficient documentation

## 2016-12-08 DIAGNOSIS — E785 Hyperlipidemia, unspecified: Secondary | ICD-10-CM

## 2016-12-08 DIAGNOSIS — R0789 Other chest pain: Secondary | ICD-10-CM

## 2016-12-08 LAB — CBC WITH DIFFERENTIAL/PLATELET
BASOS PCT: 0.6 % (ref 0.0–3.0)
Basophils Absolute: 0 10*3/uL (ref 0.0–0.1)
EOS PCT: 3.9 % (ref 0.0–5.0)
Eosinophils Absolute: 0.3 10*3/uL (ref 0.0–0.7)
HEMATOCRIT: 51.6 % (ref 39.0–52.0)
HEMOGLOBIN: 17.7 g/dL — AB (ref 13.0–17.0)
LYMPHS PCT: 23.8 % (ref 12.0–46.0)
Lymphs Abs: 1.9 10*3/uL (ref 0.7–4.0)
MCHC: 34.2 g/dL (ref 30.0–36.0)
MCV: 90.2 fl (ref 78.0–100.0)
Monocytes Absolute: 0.8 10*3/uL (ref 0.1–1.0)
Monocytes Relative: 9.6 % (ref 3.0–12.0)
Neutro Abs: 5 10*3/uL (ref 1.4–7.7)
Neutrophils Relative %: 62.1 % (ref 43.0–77.0)
Platelets: 170 10*3/uL (ref 150.0–400.0)
RBC: 5.72 Mil/uL (ref 4.22–5.81)
RDW: 13.4 % (ref 11.5–15.5)
WBC: 8.1 10*3/uL (ref 4.0–10.5)

## 2016-12-08 LAB — LIPID PANEL
CHOLESTEROL: 158 mg/dL (ref 0–200)
HDL: 37.3 mg/dL — ABNORMAL LOW (ref >39.00)
LDL Cholesterol: 89 mg/dL (ref 0–99)
NonHDL: 120.31
TRIGLYCERIDES: 157 mg/dL — AB (ref 0.0–149.0)
Total CHOL/HDL Ratio: 4
VLDL: 31.4 mg/dL (ref 0.0–40.0)

## 2016-12-08 LAB — BASIC METABOLIC PANEL
BUN: 22 mg/dL (ref 6–23)
CALCIUM: 9.5 mg/dL (ref 8.4–10.5)
CO2: 29 mEq/L (ref 19–32)
Chloride: 101 mEq/L (ref 96–112)
Creatinine, Ser: 1.34 mg/dL (ref 0.40–1.50)
GFR: 56.23 mL/min — AB (ref >60.00)
GLUCOSE: 101 mg/dL — AB (ref 70–99)
Potassium: 4.3 mEq/L (ref 3.5–5.1)
Sodium: 136 mEq/L (ref 135–145)

## 2016-12-08 LAB — TROPONIN I: TNIDX: 0.01 ug/l (ref 0.00–0.06)

## 2016-12-08 LAB — TSH: TSH: 1.29 u[IU]/mL (ref 0.35–4.50)

## 2016-12-08 MED ORDER — ATORVASTATIN CALCIUM 40 MG PO TABS: 40.0000 mg | ORAL_TABLET | Freq: Every day | ORAL | 6 refills | Status: DC

## 2016-12-08 MED ORDER — NITROGLYCERIN 0.4 MG SL SUBL: 0.4000 mg | SUBLINGUAL_TABLET | SUBLINGUAL | 1 refills | Status: DC | PRN

## 2016-12-08 NOTE — Progress Notes (Signed)
BP 124/78 (BP Location: Left Arm, Patient Position: Sitting, Cuff Size: Large)   Pulse 61   Temp 98.1 F (36.7 C) (Oral)   Wt 272 lb 4 oz (123.5 kg)   SpO2 95%   BMI 40.20 kg/m    CC: chest discomfort Subjective:    Patient ID: Aaron Mitchell, male    DOB: May 27, 1946, 71 y.o.   MRN: [id number]  HPI: Aaron Mitchell is a 71 y.o. male presenting on 12/08/2016 for Follow-up (heart murmur) and Chest Discomfort   Last visit I heard mild systolic murmur - pt endorsed new murmur without any cardiac symptoms. Advised we would just monitor.   Pt endorses exertional "tight band across chest" associated with dyspnea - ie working in the yard, sex. Small prick sensations when working on computer. No nausea. Ongoing over the past month.   He did recently accidentally take vit D 50,000 IU daily for 12 days several months ago (instead of weekly)  He has been taking regularly aleve 440mg  and diclofenac 75mg  daily.  He takes testosterone by urology for hypogonadism.  HLD - he was previously on statin but stopped years ago. He thinks he tolerated lipitor in the past.   Relevant past medical, surgical, family and social history reviewed and updated as indicated. Interim medical history since our last visit reviewed. Allergies and medications reviewed and updated. Outpatient Medications Prior to Visit  Medication Sig Dispense Refill  . allopurinol (ZYLOPRIM) 300 MG tablet TAKE 1 TABLET(300 MG) BY MOUTH DAILY 90 tablet 3  . amLODipine (NORVASC) 5 MG tablet Take 1 tablet (5 mg total) by mouth 2 (two) times daily. 180 tablet 3  . aspirin EC 81 MG tablet Take 81 mg by mouth daily.    . diclofenac (VOLTAREN) 75 MG EC tablet TAKE 1 TABLET BY MOUTH TWICE DAILY AS NEEDED. CAUTION: LONG-TERM USE CAN CAUSE KIDNEY PROBLEMS, RISK OF HEART ATTACK 60 tablet 0  . diclofenac sodium (VOLTAREN) 1 % GEL Apply 2 g topically daily as needed. Apply to both hands.    . fluticasone (FLONASE) 50 MCG/ACT nasal spray Place  2 sprays into both nostrils daily. 16 g 0  . HYDROcodone-acetaminophen (LORTAB) 10-500 MG tablet Take 1 tablet by mouth every 8 (eight) hours as needed for pain.    [name] Kitchen L-Lysine 500 MG TABS Take 500 mg by mouth daily.    [name] Kitchen levocetirizine (XYZAL) 5 MG tablet Take 1 tablet (5 mg total) by mouth every evening. 90 tablet 3  . lisinopril-hydrochlorothiazide (PRINZIDE,ZESTORETIC) 20-12.5 MG tablet Take 1 tablet by mouth daily. 90 tablet 3  . metaxalone (SKELAXIN) 800 MG tablet Take 1 tablet (800 mg total) by mouth 3 (three) times daily as needed for muscle spasms. 90 tablet 0  . Multiple Vitamins tablet Take 1 tablet by mouth daily.    . naproxen sodium (ANAPROX) 220 MG tablet Take 440 mg by mouth 2 (two) times daily with a meal.    . nitroGLYCERIN (NITRODUR - DOSED IN MG/24 HR) 0.2 mg/hr patch 1/2 patch daily Must see provider to obtain additional refills. 30 patch 0  . testosterone cypionate (DEPOTESTOSTERONE CYPIONATE) 200 MG/ML injection Inject 100 mg into the muscle every 7 (seven) days. Use on Friday.    . Vitamin D, Ergocalciferol, (DRISDOL) 50000 units CAPS capsule TAKE 1 CAPSULE BY MOUTH EVERY 7 DAYS 12 capsule 0  . zolpidem (AMBIEN) 10 MG tablet Take 10 mg by mouth at bedtime as needed. For sleep.     No facility-administered medications prior to  visit.      Per HPI unless specifically indicated in ROS section below Review of Systems     Objective:    BP 124/78 (BP Location: Left Arm, Patient Position: Sitting, Cuff Size: Large)   Pulse 61   Temp 98.1 F (36.7 C) (Oral)   Wt 272 lb 4 oz (123.5 kg)   SpO2 95%   BMI 40.20 kg/m   Wt Readings from Last 3 Encounters:  12/08/16 272 lb 4 oz (123.5 kg)  09/14/16 274 lb 12.8 oz (124.6 kg)  08/05/16 278 lb (126.1 kg)    Physical Exam  Constitutional: He appears well-developed and well-nourished. No distress.  HENT:  Mouth/Throat: Oropharynx is clear and moist. No oropharyngeal exudate.  Eyes: Conjunctivae are normal. Pupils are equal,  round, and reactive to light.  Neck: Normal range of motion. Neck supple. No thyromegaly present.  Cardiovascular: Normal rate, regular rhythm and intact distal pulses.   Murmur (3/6 SEM USB) heard. Pulmonary/Chest: Effort normal and breath sounds normal. No respiratory distress. He has no wheezes. He has no rales. He exhibits no tenderness.  No reproducible chest wall tenderness.  Musculoskeletal: He exhibits no edema.  Skin: Skin is warm and dry. No rash noted.  Psychiatric: He has a normal mood and affect.  Nursing note and vitals reviewed.  Results for orders placed or performed in visit on 09/14/16  Hemoglobin A1c  Result Value Ref Range   Hgb A1c MFr Bld 5.9 4.6 - 6.5 %  Comprehensive metabolic panel  Result Value Ref Range   Sodium 135 135 - 145 mEq/L   Potassium 4.5 3.5 - 5.1 mEq/L   Chloride 103 96 - 112 mEq/L   CO2 21 19 - 32 mEq/L   Glucose, Bld 183 (H) 70 - 99 mg/dL   BUN 16 6 - 23 mg/dL   Creatinine, Ser 1.19 0.40 - 1.50 mg/dL   Total Bilirubin 0.6 0.2 - 1.2 mg/dL   Alkaline Phosphatase 49 39 - 117 U/L   AST 27 0 - 37 U/L   ALT 32 0 - 53 U/L   Total Protein 7.0 6.0 - 8.3 g/dL   Albumin 4.4 3.5 - 5.2 g/dL   Calcium 9.3 8.4 - 10.5 mg/dL   GFR 64.52 >60.00 mL/min  LDL Cholesterol, Direct  Result Value Ref Range   Direct LDL 108.0 mg/dL  Uric acid  Result Value Ref Range   Uric Acid, Serum 6.3 4.0 - 7.8 mg/dL   EKG - NSR rate 60s, LAD, prolonged PR, Q waves inferiorly and septally, noncontiguous ST changes septally, poor R wave progression, no old to compare.    Assessment & Plan:   Problem List Items Addressed This Visit    Chest discomfort - Primary    Concern for typical angina with multiple risk factors (obesity, hypertension). No pain currently. EKG with signs of possible old infarct. Will place urgent referral to cards. Advised no exertion until seen. Check stat TnI and other labs today.  Advised decrease NSAIDs, testosterone.  Rx lipitor 40mg  daily and  nitro SL PRN chest pain. Indications to seek ER care reviewed. Pt agrees with plan.       Relevant Orders   EKG 12-Lead (Completed)   Lipid panel   TSH   CBC with Differential/Platelet   Basic metabolic panel   Troponin I   Ambulatory referral to Cardiology   Essential (primary) hypertension   Relevant Medications   atorvastatin (LIPITOR) 40 MG tablet   nitroGLYCERIN (NITROSTAT) 0.4 MG SL  tablet   HLD (hyperlipidemia)    Start statin for plaque stabilization effect      Relevant Medications   atorvastatin (LIPITOR) 40 MG tablet   nitroGLYCERIN (NITROSTAT) 0.4 MG SL tablet   Prediabetes   Severe obesity (BMI >= 40) (HCC)   Systolic murmur    Mild, stable.          Follow up plan: Return if symptoms worsen or fail to improve.  [name], MD

## 2016-12-08 NOTE — Assessment & Plan Note (Signed)
Mild, stable.  

## 2016-12-08 NOTE — Patient Instructions (Addendum)
I am concerned about this chest pain that may be coming from the heart. Labs today Start atorvastatin 40mg  daily - sent to pharmacy. Sublingual nitroglycerin sent to pharmacy - take if recurrent chest pain - if persistent, go to ER.  See Rosaria Ferries for referral to cardiology Avoid any exertion until seen by cardiologist.

## 2016-12-08 NOTE — Assessment & Plan Note (Addendum)
Start statin for plaque stabilization effect

## 2016-12-08 NOTE — Assessment & Plan Note (Addendum)
Concern for typical angina with multiple risk factors (obesity, hypertension). No pain currently. EKG with signs of possible old infarct. Will place urgent referral to cards. Advised no exertion until seen. Check stat TnI and other labs today.  Advised decrease NSAIDs, testosterone.  Rx lipitor 40mg  daily and nitro SL PRN chest pain. Indications to seek ER care reviewed. Pt agrees with plan.

## 2016-12-09 ENCOUNTER — Other Ambulatory Visit: Payer: Self-pay | Admitting: Family Medicine

## 2016-12-11 DIAGNOSIS — J45909 Unspecified asthma, uncomplicated: Secondary | ICD-10-CM | POA: Diagnosis not present

## 2016-12-11 DIAGNOSIS — E669 Obesity, unspecified: Secondary | ICD-10-CM | POA: Diagnosis not present

## 2016-12-11 DIAGNOSIS — I10 Essential (primary) hypertension: Secondary | ICD-10-CM | POA: Diagnosis not present

## 2016-12-11 DIAGNOSIS — G4733 Obstructive sleep apnea (adult) (pediatric): Secondary | ICD-10-CM | POA: Diagnosis not present

## 2016-12-11 DIAGNOSIS — R011 Cardiac murmur, unspecified: Secondary | ICD-10-CM | POA: Diagnosis not present

## 2016-12-11 DIAGNOSIS — R9431 Abnormal electrocardiogram [ECG] [EKG]: Secondary | ICD-10-CM | POA: Diagnosis not present

## 2016-12-11 DIAGNOSIS — M1990 Unspecified osteoarthritis, unspecified site: Secondary | ICD-10-CM | POA: Diagnosis not present

## 2016-12-11 DIAGNOSIS — R0602 Shortness of breath: Secondary | ICD-10-CM | POA: Diagnosis not present

## 2016-12-17 DIAGNOSIS — G4733 Obstructive sleep apnea (adult) (pediatric): Secondary | ICD-10-CM | POA: Diagnosis not present

## 2016-12-24 DIAGNOSIS — G4733 Obstructive sleep apnea (adult) (pediatric): Secondary | ICD-10-CM | POA: Diagnosis not present

## 2016-12-30 ENCOUNTER — Ambulatory Visit: Payer: PPO | Admitting: Nurse Practitioner

## 2016-12-31 ENCOUNTER — Ambulatory Visit: Payer: PPO | Admitting: Nurse Practitioner

## 2017-01-04 ENCOUNTER — Other Ambulatory Visit: Payer: Self-pay | Admitting: Family Medicine

## 2017-01-04 DIAGNOSIS — R9431 Abnormal electrocardiogram [ECG] [EKG]: Secondary | ICD-10-CM | POA: Diagnosis not present

## 2017-01-04 DIAGNOSIS — R062 Wheezing: Secondary | ICD-10-CM | POA: Diagnosis not present

## 2017-01-05 ENCOUNTER — Telehealth: Payer: Self-pay

## 2017-01-05 DIAGNOSIS — R0602 Shortness of breath: Secondary | ICD-10-CM | POA: Diagnosis not present

## 2017-01-05 DIAGNOSIS — R9431 Abnormal electrocardiogram [ECG] [EKG]: Secondary | ICD-10-CM | POA: Diagnosis not present

## 2017-01-05 MED ORDER — ZOSTER VAC RECOMB ADJUVANTED 50 MCG/0.5ML IM SUSR: 0.5000 mL | Freq: Once | INTRAMUSCULAR | 1 refills | Status: AC

## 2017-01-05 NOTE — Telephone Encounter (Signed)
Patient says that his insurance now tells him that the vaccine must be given at our office.  Patient advised we do not have the vaccine right now because of the shortage.

## 2017-01-05 NOTE — Telephone Encounter (Signed)
Pt left v/m requesting shingrix (Updated shingles vaccine) to [facility] in [place]. Pt would like to get shot today.

## 2017-01-05 NOTE — Telephone Encounter (Signed)
Rx sent to pharmacy Remind this is a 2 shot series - will need to return to pharmacy in 2 months for 2nd injection.

## 2017-01-18 DIAGNOSIS — M1990 Unspecified osteoarthritis, unspecified site: Secondary | ICD-10-CM | POA: Diagnosis not present

## 2017-01-18 DIAGNOSIS — G4733 Obstructive sleep apnea (adult) (pediatric): Secondary | ICD-10-CM | POA: Diagnosis not present

## 2017-01-18 DIAGNOSIS — R9431 Abnormal electrocardiogram [ECG] [EKG]: Secondary | ICD-10-CM | POA: Diagnosis not present

## 2017-01-18 DIAGNOSIS — R0602 Shortness of breath: Secondary | ICD-10-CM | POA: Diagnosis not present

## 2017-01-18 DIAGNOSIS — J45909 Unspecified asthma, uncomplicated: Secondary | ICD-10-CM | POA: Diagnosis not present

## 2017-01-18 DIAGNOSIS — R011 Cardiac murmur, unspecified: Secondary | ICD-10-CM | POA: Diagnosis not present

## 2017-01-18 DIAGNOSIS — E669 Obesity, unspecified: Secondary | ICD-10-CM | POA: Diagnosis not present

## 2017-01-18 DIAGNOSIS — I10 Essential (primary) hypertension: Secondary | ICD-10-CM | POA: Diagnosis not present

## 2017-01-23 DIAGNOSIS — G4733 Obstructive sleep apnea (adult) (pediatric): Secondary | ICD-10-CM | POA: Diagnosis not present

## 2017-01-25 DIAGNOSIS — E113212 Type 2 diabetes mellitus with mild nonproliferative diabetic retinopathy with macular edema, left eye: Secondary | ICD-10-CM | POA: Diagnosis not present

## 2017-02-23 DIAGNOSIS — G4733 Obstructive sleep apnea (adult) (pediatric): Secondary | ICD-10-CM | POA: Diagnosis not present

## 2017-03-03 ENCOUNTER — Telehealth: Payer: Self-pay | Admitting: Family Medicine

## 2017-03-03 NOTE — Telephone Encounter (Signed)
Refill denied. rx request sent by mistake.

## 2017-03-04 DIAGNOSIS — Z87891 Personal history of nicotine dependence: Secondary | ICD-10-CM | POA: Diagnosis not present

## 2017-03-04 DIAGNOSIS — I10 Essential (primary) hypertension: Secondary | ICD-10-CM | POA: Diagnosis not present

## 2017-03-04 DIAGNOSIS — N401 Enlarged prostate with lower urinary tract symptoms: Secondary | ICD-10-CM | POA: Diagnosis not present

## 2017-03-04 DIAGNOSIS — E119 Type 2 diabetes mellitus without complications: Secondary | ICD-10-CM | POA: Diagnosis not present

## 2017-03-04 DIAGNOSIS — N529 Male erectile dysfunction, unspecified: Secondary | ICD-10-CM | POA: Diagnosis not present

## 2017-03-04 DIAGNOSIS — J45909 Unspecified asthma, uncomplicated: Secondary | ICD-10-CM | POA: Diagnosis not present

## 2017-03-04 DIAGNOSIS — Z79899 Other long term (current) drug therapy: Secondary | ICD-10-CM | POA: Diagnosis not present

## 2017-03-04 DIAGNOSIS — E291 Testicular hypofunction: Secondary | ICD-10-CM | POA: Diagnosis not present

## 2017-03-04 DIAGNOSIS — N138 Other obstructive and reflux uropathy: Secondary | ICD-10-CM | POA: Diagnosis not present

## 2017-03-08 NOTE — Telephone Encounter (Signed)
Patient called to check on this refill request. He states he does need this medication.

## 2017-03-08 NOTE — Telephone Encounter (Signed)
We have not seen him since 03/2016. Should he still be using the nitro patches?

## 2017-03-08 NOTE — Telephone Encounter (Signed)
Spoke to pt, made appt for Friday 8.17.18.

## 2017-03-08 NOTE — Telephone Encounter (Signed)
I would like to see him again before I refill it

## 2017-03-12 ENCOUNTER — Ambulatory Visit (INDEPENDENT_AMBULATORY_CARE_PROVIDER_SITE_OTHER): Payer: PPO | Admitting: Family Medicine

## 2017-03-12 ENCOUNTER — Ambulatory Visit: Payer: Self-pay

## 2017-03-12 ENCOUNTER — Encounter: Payer: Self-pay | Admitting: Family Medicine

## 2017-03-12 VITALS — BP 128/62 | HR 58 | Ht 70.0 in | Wt 272.0 lb

## 2017-03-12 DIAGNOSIS — M79672 Pain in left foot: Secondary | ICD-10-CM

## 2017-03-12 DIAGNOSIS — M7662 Achilles tendinitis, left leg: Secondary | ICD-10-CM | POA: Diagnosis not present

## 2017-03-12 MED ORDER — NITROGLYCERIN 0.2 MG/HR TD PT24: MEDICATED_PATCH | TRANSDERMAL | 6 refills | Status: DC

## 2017-03-12 NOTE — Patient Instructions (Signed)
Good to see you  Would add the heel lift Continue the nitro Ice is your friend Read about PRP See me again at least 1 time a year

## 2017-03-12 NOTE — Assessment & Plan Note (Signed)
Stable. Mild hypoechoic changes noted. Patient will continue with the nitroglycerin again. We discussed continuing the home exercises. Encourage patient to physical therapy which patient declined. Discuss PRP.

## 2017-03-12 NOTE — Progress Notes (Signed)
[name] [address] [place], [place] [address] Phone: [phone] Subjective:    I'm seeing this patient by the request  of:    CC: Left ankle pain follow-up  VPX:[id number]  Aaron Mitchell is a 71 y.o. male coming in with complaint of left ankle pain. Continues to have discomfort. Patient does have Achilles tendinitis. Has been doing the lesser for one year. Feels like his long as he is doing this. Able to do daily activities without any significant pain. Has tried discontinuing on multiple occasions and unfortunately continues to have difficulty. Did have workup for intermittent chest pain that was completely unremarkable recently. Not taking any Viagra. Denies any side effects to the Achilles. Does not do the exercises but does continue to wear brace.    Past Medical History:  Diagnosis Date  . Achilles tendinitis    Left leg  . Borderline diabetes mellitus   . Childhood asthma   . Degenerative disc disease, lumbar    rec against surgery  . Erectile dysfunction   . GI bleed 12/2015   hospitalization  . Gout   . Hypertension   . Ileus ([place]) 12/2015   hospitalization   . Male hypogonadism    followed by urology on T injections ([place])  . Seasonal allergies    pollen  . Severe obesity (BMI >= 40) (HCC)   . Shoulder bursitis   . Sleep apnea    CPAP   Past Surgical History:  Procedure Laterality Date  . ADENOIDECTOMY    . CARPAL TUNNEL RELEASE Right 16-Nov-2013  . CATARACT EXTRACTION W/PHACO Right 07/15/2016   Procedure: CATARACT EXTRACTION PHACO AND INTRAOCULAR LENS PLACEMENT (IOC);  Surgeon: Leandrew Koyanagi, MD;  Location: [facility];  Service: Ophthalmology;  Laterality: Right;  TORIC sleep apnea  . CATARACT EXTRACTION W/PHACO Left 08/05/2016   Procedure: CATARACT EXTRACTION PHACO AND INTRAOCULAR LENS PLACEMENT (IOC) toric;  Surgeon: Leandrew Koyanagi, MD;  Location: [facility];  Service: Ophthalmology;  Laterality:  Left;  left sleep apnea toric  . COLONOSCOPY  10/2012   rpt 5 yrs ([facility])  . EYE SURGERY     Lens replacement 07/12/2016,08/05/2016  . RHINOPLASTY    . TONSILLECTOMY  ?1954  . TRIGGER FINGER RELEASE Right Nov 16, 2013   4th   Social History   Social History  . Marital status: Married    Spouse name: N/A  . Number of children: N/A  . Years of education: N/A   Social History Main Topics  . Smoking status: Former Smoker    Quit date: 07/27/1978  . Smokeless tobacco: Never Used     Comment: Quit in 11-17-1978  . Alcohol use 3.0 oz/week    5 Shots of liquor per week     Comment:    . Drug use: No  . Sexual activity: Not Asked   Other Topics Concern  . None   Social History Narrative   Lives at home with wife [name]), dog passed away 2015-11-17   1 grown child   Occ: retired Clinical biochemist, was Chief Technology Officer    Edu: 2 yrs college   Activity: no regular exercise   Diet: good water, fruits daily, splenda iced tea   Allergies  Allergen Reactions  . Codeine Other (See Comments)    Other reaction(s): Other (See Comments) HEADACHES Causes headaches when he stops liquid Codeine-based products   Family History  Problem Relation Age of Onset  . Alcohol abuse Father   . Hypertension  Father   . AVM Father 29       hemorrhagic stroke  . Diabetes Paternal Grandmother   . Cancer Neg Hx   . CAD Neg Hx      Past medical history, social, surgical and family history all reviewed in electronic medical record.  No pertanent information unless stated regarding to the chief complaint.   Review of Systems:Review of systems updated and as accurate as of 03/12/17  No headache, visual changes, nausea, vomiting, diarrhea, constipation, dizziness, abdominal pain, skin rash, fevers, chills, night sweats, weight loss, swollen lymph nodes, body aches, joint swelling,, chest pain, shortness of breath, mood changes. Positive muscle aches  Objective  Blood pressure 128/62, pulse (!) 58, height 5\' 10"   (1.778 m), weight 272 lb (123.4 kg). Systems examined below as of 03/12/17   General: No apparent distress alert and oriented x3 mood and affect normal, dressed appropriately.  HEENT: Pupils equal, extraocular movements intact  Respiratory: Patient's speak in full sentences and does not appear short of breath  Cardiovascular: No lower extremity edema, non tender, no erythema  Skin: Warm dry intact with no signs of infection or rash on extremities or on axial skeleton.  Abdomen: Soft nontender  Neuro: Cranial nerves II through XII are intact, neurovascularly intact in all extremities with 2+ DTRs and 2+ pulses.  Lymph: No lymphadenopathy of posterior or anterior cervical chain or axillae bilaterally.  Gait antalgic gait MSK:  Non tender with full range of motion and good stability and symmetric strength and tone of shoulders, elbows, wrist, hip, knee bilaterally.  Ankle: Left No visible erythema or swelling. Range of motion is full in all directions. Mild tightness in dorsiflexion Strength is 5/5 in all directions. Stable lateral and medial ligaments; squeeze test and kleiger test unremarkable; Talar dome nontender; No pain at base of 5th MT; No tenderness over cuboid; No tenderness over N spot or navicular prominence No tenderness on posterior aspects of lateral and medial malleolus No sign of peroneal tendon subluxations or tenderness to palpation mild discomfort though still at the base of the Achilles Negative tarsal tunnel tinel's Able to walk 4 steps.  MSK US performed of: Left ankle This study was ordered, performed, and interpreted by Charlann Boxer D.O.  Foot/Ankle:   Achilles does show still a nodule noted. Significant less calcific changes and previous exam. Still some mild increase in Doppler flow.  IMPRESSION:  Chronic Achilles tendinosis but some improvement     Impression and Recommendations:     This case required medical decision making of moderate complexity.        Note: This dictation was prepared with Dragon dictation along with smaller phrase technology. Any transcriptional errors that result from this process are unintentional.

## 2017-03-26 DIAGNOSIS — G4733 Obstructive sleep apnea (adult) (pediatric): Secondary | ICD-10-CM | POA: Diagnosis not present

## 2017-03-30 ENCOUNTER — Encounter: Payer: Self-pay | Admitting: Family Medicine

## 2017-03-30 ENCOUNTER — Ambulatory Visit (INDEPENDENT_AMBULATORY_CARE_PROVIDER_SITE_OTHER)
Admission: RE | Admit: 2017-03-30 | Discharge: 2017-03-30 | Disposition: A | Discharge status: Home / Self Care | Payer: PPO | Source: Ambulatory Visit | Attending: Family Medicine | Admitting: Family Medicine

## 2017-03-30 ENCOUNTER — Ambulatory Visit (INDEPENDENT_AMBULATORY_CARE_PROVIDER_SITE_OTHER): Payer: PPO | Admitting: Family Medicine

## 2017-03-30 VITALS — BP 134/68 | HR 53 | Temp 97.9°F | Wt 278.5 lb

## 2017-03-30 DIAGNOSIS — R0989 Other specified symptoms and signs involving the circulatory and respiratory systems: Secondary | ICD-10-CM | POA: Diagnosis not present

## 2017-03-30 DIAGNOSIS — R05 Cough: Secondary | ICD-10-CM | POA: Diagnosis not present

## 2017-03-30 DIAGNOSIS — I340 Nonrheumatic mitral (valve) insufficiency: Secondary | ICD-10-CM

## 2017-03-30 DIAGNOSIS — J449 Chronic obstructive pulmonary disease, unspecified: Secondary | ICD-10-CM | POA: Diagnosis not present

## 2017-03-30 MED ORDER — ALBUTEROL SULFATE (2.5 MG/3ML) 0.083% IN NEBU
2.5000 mg | INHALATION_SOLUTION | Freq: Once | RESPIRATORY_TRACT | Status: AC
  Administered 2017-03-30: 2.5 mg via RESPIRATORY_TRACT

## 2017-03-30 MED ORDER — ALBUTEROL SULFATE HFA 108 (90 BASE) MCG/ACT IN AERS: 2.0000 | INHALATION_SPRAY | Freq: Four times a day (QID) | RESPIRATORY_TRACT | 1 refills | Status: DC | PRN

## 2017-03-30 NOTE — Assessment & Plan Note (Signed)
Mild -mod MR by recent echo 12/2016. Discussed with patient.

## 2017-03-30 NOTE — Progress Notes (Signed)
BP 134/68   Pulse (!) 53   Temp 97.9 F (36.6 C) (Oral)   Wt 278 lb 8 oz (126.3 kg)   SpO2 98%   BMI 39.96 kg/m    CC: chest congestion Subjective:    Patient ID: Aaron Mitchell, male    DOB: 11/17/1945, 71 y.o.   MRN: [id number]  HPI: Aaron Mitchell is a 71 y.o. male presenting on 03/30/2017 for congestion in chest   Seen 11/2016 with concern for exertional anginal chest pain, but recent stress myoview and echocardiogram 12/2016 were unremarkable. Saw Dr Clayborn Bigness at [facility]. Normal perfusion study, no evidence of stress induced ischemia, EF 50%.   Ongoing chest discomfort and tightness for last 6 months. Especially noted during sex when laying on his back notes progressively worsening dyspnea, cough with pink phlegm. Slowly getting worse. Symptoms don't occur during regular sleep - uses one pillow at night time.   Denies fevers/chills, leg swelling, paroxysmal nocturnal dyspnea. No wheezing. No significant chest pain or palpitations or dizziness at this time.  No sick contacts at home.  Ex smoker - quit 1980, smoked 2 ppd x 15 yrs.    Endorses severe OA in lower back.  Mild-mod mitral insuff, F 55%.   Relevant past medical, surgical, family and social history reviewed and updated as indicated. Interim medical history since our last visit reviewed. Allergies and medications reviewed and updated. Outpatient Medications Prior to Visit  Medication Sig Dispense Refill  . allopurinol (ZYLOPRIM) 300 MG tablet TAKE 1 TABLET(300 MG) BY MOUTH DAILY 90 tablet 3  . amLODipine (NORVASC) 5 MG tablet Take 1 tablet (5 mg total) by mouth 2 (two) times daily. 180 tablet 3  . aspirin EC 81 MG tablet Take 81 mg by mouth daily.    [name] Kitchen atorvastatin (LIPITOR) 40 MG tablet Take 1 tablet (40 mg total) by mouth daily. 30 tablet 6  . diclofenac (VOLTAREN) 75 MG EC tablet TAKE 1 TABLET BY MOUTH TWICE DAILY AS NEEDED. CAUTION: LONG-TERM USE CAN CAUSE KIDNEY PROBLEMS, RISK OF HEART ATTACK 60 tablet 0    . diclofenac sodium (VOLTAREN) 1 % GEL Apply 2 g topically daily as needed. Apply to both hands.    . fluticasone (FLONASE) 50 MCG/ACT nasal spray SHAKE LIQUID AND USE 2 SPRAYS IN EACH NOSTRIL DAILY 16 g 6  . HYDROcodone-acetaminophen (LORTAB) 10-500 MG tablet Take 1 tablet by mouth every 8 (eight) hours as needed for pain.    [name] Kitchen L-Lysine 500 MG TABS Take 500 mg by mouth daily.    [name] Kitchen levocetirizine (XYZAL) 5 MG tablet Take 1 tablet (5 mg total) by mouth every evening. 90 tablet 3  . lisinopril-hydrochlorothiazide (PRINZIDE,ZESTORETIC) 20-12.5 MG tablet Take 1 tablet by mouth daily. 90 tablet 3  . metaxalone (SKELAXIN) 800 MG tablet Take 1 tablet (800 mg total) by mouth 3 (three) times daily as needed for muscle spasms. 90 tablet 0  . Multiple Vitamins tablet Take 1 tablet by mouth daily.    . nitroGLYCERIN (NITRODUR - DOSED IN MG/24 HR) 0.2 mg/hr patch USE 1/2 PATCH DAILY 30 patch 6  . testosterone cypionate (DEPOTESTOSTERONE CYPIONATE) 200 MG/ML injection Inject 100 mg into the muscle every 7 (seven) days. Use on Friday.    . Vitamin D, Ergocalciferol, (DRISDOL) 50000 units CAPS capsule TAKE 1 CAPSULE BY MOUTH EVERY 7 DAYS 12 capsule 0  . zolpidem (AMBIEN) 10 MG tablet Take 10 mg by mouth at bedtime as needed. For sleep.    . naproxen sodium (ANAPROX)  220 MG tablet Take 440 mg by mouth 2 (two) times daily with a meal.    . nitroGLYCERIN (NITROSTAT) 0.4 MG SL tablet Place 1 tablet (0.4 mg total) under the tongue every 5 (five) minutes as needed for chest pain. 20 tablet 1   No facility-administered medications prior to visit.      Per HPI unless specifically indicated in ROS section below Review of Systems     Objective:    BP 134/68   Pulse (!) 53   Temp 97.9 F (36.6 C) (Oral)   Wt 278 lb 8 oz (126.3 kg)   SpO2 98%   BMI 39.96 kg/m   Wt Readings from Last 3 Encounters:  03/30/17 278 lb 8 oz (126.3 kg)  03/12/17 272 lb (123.4 kg)  12/08/16 272 lb 4 oz (123.5 kg)    Physical Exam   Constitutional: He appears well-developed and well-nourished. No distress.  HENT:  Mouth/Throat: Oropharynx is clear and moist. No oropharyngeal exudate.  Cardiovascular: Normal rate, regular rhythm and intact distal pulses.   Murmur (2/6 SEM at USB) heard. Pulmonary/Chest: Effort normal and breath sounds normal. No respiratory distress. He has no wheezes. He has no rales.  Musculoskeletal: He exhibits no edema.  Skin: Skin is warm and dry. No rash noted.  Psychiatric: He has a normal mood and affect.  Nursing note and vitals reviewed.  Results for orders placed or performed in visit on 12/08/16  Lipid panel  Result Value Ref Range   Cholesterol 158 0 - 200 mg/dL   Triglycerides 157.0 (H) 0.0 - 149.0 mg/dL   HDL 37.30 (L) >39.00 mg/dL   VLDL 31.4 0.0 - 40.0 mg/dL   LDL Cholesterol 89 0 - 99 mg/dL   Total CHOL/HDL Ratio 4    NonHDL 120.31   TSH  Result Value Ref Range   TSH 1.29 0.35 - 4.50 uIU/mL  CBC with Differential/Platelet  Result Value Ref Range   WBC 8.1 4.0 - 10.5 K/uL   RBC 5.72 4.22 - 5.81 Mil/uL   Hemoglobin 17.7 (H) 13.0 - 17.0 g/dL   HCT 51.6 39.0 - 52.0 %   MCV 90.2 78.0 - 100.0 fl   MCHC 34.2 30.0 - 36.0 g/dL   RDW 13.4 11.5 - 15.5 %   Platelets 170.0 150.0 - 400.0 K/uL   Neutrophils Relative % 62.1 43.0 - 77.0 %   Lymphocytes Relative 23.8 12.0 - 46.0 %   Monocytes Relative 9.6 3.0 - 12.0 %   Eosinophils Relative 3.9 0.0 - 5.0 %   Basophils Relative 0.6 0.0 - 3.0 %   Neutro Abs 5.0 1.4 - 7.7 K/uL   Lymphs Abs 1.9 0.7 - 4.0 K/uL   Monocytes Absolute 0.8 0.1 - 1.0 K/uL   Eosinophils Absolute 0.3 0.0 - 0.7 K/uL   Basophils Absolute 0.0 0.0 - 0.1 K/uL  Basic metabolic panel  Result Value Ref Range   Sodium 136 135 - 145 mEq/L   Potassium 4.3 3.5 - 5.1 mEq/L   Chloride 101 96 - 112 mEq/L   CO2 29 19 - 32 mEq/L   Glucose, Bld 101 (H) 70 - 99 mg/dL   BUN 22 6 - 23 mg/dL   Creatinine, Ser 1.34 0.40 - 1.50 mg/dL   Calcium 9.5 8.4 - 10.5 mg/dL   GFR 56.23  (L) >60.00 mL/min  Troponin I  Result Value Ref Range   TNIDX 0.01 0.00 - 0.06 ug/l   Spirometry:  Pre - FVC 97%, FEV1 41%, ratio 0.31  Post - FVC 117%, FEV1 37%, ratio 0.23 no improvement with albuterol Consistent with severe airway obstruction - Gold stage III COPD     Assessment & Plan:  Over 40 minutes were spent face-to-face with the patient during this encounter and >50% of that time was spent on counseling and coordination of care  Problem List Items Addressed This Visit    Chest congestion - Primary    Recent reassuring cardiac evaluation 12/2016 ([facility]).  Ongoing symptoms - predominantly dyspnea with sex when supine leading to cough with pink tinged phlegm. Echo without evidence of heart failure. Check xray and spirometry today in remote ex smoker.  Discussed possible MR contribution.  Encouraged different position - not fully supine - and work on weight loss through healthy diet and lifestyle changes.  Will continue to monitor otherwise.       Relevant Medications   albuterol (PROVENTIL) (2.5 MG/3ML) 0.083% nebulizer solution 2.5 mg (Completed)   Other Relevant Orders   DG Chest 2 View (Completed)   Spirometry: Peak (Completed)   Moderate mitral insufficiency    Mild -mod MR by recent echo 12/2016. Discussed with patient.       Relevant Medications   albuterol (PROVENTIL) (2.5 MG/3ML) 0.083% nebulizer solution 2.5 mg (Completed)   Other Relevant Orders   Spirometry: Peak (Completed)   Severe obesity (BMI >= 40) (HCC)    Encouraged incorporating regular walking into routine for goal weight loss.  He is considering next-56-day diet plan.       Stage 3 severe COPD by GOLD classification ([place])    Gold stage 3 COPD by spirometry today.  Discussed with patient.  He feels breathing significantly improved after albuterol nebulization during spirometry testing today so will Rx albuterol inhaler to have at home PRN.  Will discuss addition of LABA if pt willing.        Relevant Medications   albuterol (PROVENTIL HFA;VENTOLIN HFA) 108 (90 Base) MCG/ACT inhaler   albuterol (PROVENTIL) (2.5 MG/3ML) 0.083% nebulizer solution 2.5 mg (Completed)       Follow up plan: Return in about 3 months (around 06/29/2017) for annual exam, prior fasting for blood work, medicare wellness visit.  [name], MD

## 2017-03-30 NOTE — Patient Instructions (Addendum)
Xray today.  These symptoms may be coming from mitral valve insufficiency.  Try different position.  Work on incorporating walking into routine and healthy diet changes to help with weight loss.  Return in 3 months for medicare wellness with Aaron Mitchell and physical with me

## 2017-03-30 NOTE — Assessment & Plan Note (Signed)
Encouraged incorporating regular walking into routine for goal weight loss.  He is considering next-56-day diet plan.

## 2017-03-30 NOTE — Assessment & Plan Note (Addendum)
Recent reassuring cardiac evaluation 12/2016 [facility]).  Ongoing symptoms - predominantly dyspnea with sex when supine leading to cough with pink tinged phlegm. Echo without evidence of heart failure. Check xray and spirometry today in remote ex smoker.  Discussed possible MR contribution.  Encouraged different position - not fully supine - and work on weight loss through healthy diet and lifestyle changes.  Will continue to monitor otherwise.

## 2017-03-31 DIAGNOSIS — J449 Chronic obstructive pulmonary disease, unspecified: Secondary | ICD-10-CM | POA: Insufficient documentation

## 2017-03-31 NOTE — Assessment & Plan Note (Signed)
Gold stage 3 COPD by spirometry today.  Discussed with patient.  He feels breathing significantly improved after albuterol nebulization during spirometry testing today so will Rx albuterol inhaler to have at home PRN.  Will discuss addition of LABA if pt willing.

## 2017-04-07 DIAGNOSIS — G4733 Obstructive sleep apnea (adult) (pediatric): Secondary | ICD-10-CM | POA: Diagnosis not present

## 2017-04-12 DIAGNOSIS — M65312 Trigger thumb, left thumb: Secondary | ICD-10-CM | POA: Diagnosis not present

## 2017-04-14 DIAGNOSIS — M545 Low back pain: Secondary | ICD-10-CM | POA: Diagnosis not present

## 2017-04-14 DIAGNOSIS — M9903 Segmental and somatic dysfunction of lumbar region: Secondary | ICD-10-CM | POA: Diagnosis not present

## 2017-04-19 DIAGNOSIS — M65312 Trigger thumb, left thumb: Secondary | ICD-10-CM | POA: Diagnosis not present

## 2017-04-19 DIAGNOSIS — M9903 Segmental and somatic dysfunction of lumbar region: Secondary | ICD-10-CM | POA: Diagnosis not present

## 2017-04-19 DIAGNOSIS — M545 Low back pain: Secondary | ICD-10-CM | POA: Diagnosis not present

## 2017-04-21 DIAGNOSIS — M9903 Segmental and somatic dysfunction of lumbar region: Secondary | ICD-10-CM | POA: Diagnosis not present

## 2017-04-21 DIAGNOSIS — M545 Low back pain: Secondary | ICD-10-CM | POA: Diagnosis not present

## 2017-04-23 DIAGNOSIS — M9903 Segmental and somatic dysfunction of lumbar region: Secondary | ICD-10-CM | POA: Diagnosis not present

## 2017-04-23 DIAGNOSIS — M545 Low back pain: Secondary | ICD-10-CM | POA: Diagnosis not present

## 2017-04-26 DIAGNOSIS — M545 Low back pain: Secondary | ICD-10-CM | POA: Diagnosis not present

## 2017-04-26 DIAGNOSIS — M9903 Segmental and somatic dysfunction of lumbar region: Secondary | ICD-10-CM | POA: Diagnosis not present

## 2017-04-27 DIAGNOSIS — M9903 Segmental and somatic dysfunction of lumbar region: Secondary | ICD-10-CM | POA: Diagnosis not present

## 2017-04-27 DIAGNOSIS — M545 Low back pain: Secondary | ICD-10-CM | POA: Diagnosis not present

## 2017-04-29 DIAGNOSIS — M545 Low back pain: Secondary | ICD-10-CM | POA: Diagnosis not present

## 2017-04-29 DIAGNOSIS — M9903 Segmental and somatic dysfunction of lumbar region: Secondary | ICD-10-CM | POA: Diagnosis not present

## 2017-05-03 DIAGNOSIS — M545 Low back pain: Secondary | ICD-10-CM | POA: Diagnosis not present

## 2017-05-03 DIAGNOSIS — M9903 Segmental and somatic dysfunction of lumbar region: Secondary | ICD-10-CM | POA: Diagnosis not present

## 2017-05-05 DIAGNOSIS — M5126 Other intervertebral disc displacement, lumbar region: Secondary | ICD-10-CM | POA: Diagnosis not present

## 2017-05-05 DIAGNOSIS — M48062 Spinal stenosis, lumbar region with neurogenic claudication: Secondary | ICD-10-CM | POA: Diagnosis not present

## 2017-05-05 DIAGNOSIS — M5416 Radiculopathy, lumbar region: Secondary | ICD-10-CM | POA: Diagnosis not present

## 2017-05-06 ENCOUNTER — Other Ambulatory Visit: Payer: Self-pay | Admitting: *Deleted

## 2017-05-06 DIAGNOSIS — M545 Low back pain: Secondary | ICD-10-CM | POA: Diagnosis not present

## 2017-05-06 DIAGNOSIS — M9903 Segmental and somatic dysfunction of lumbar region: Secondary | ICD-10-CM | POA: Diagnosis not present

## 2017-05-06 MED ORDER — ZOLPIDEM TARTRATE 10 MG PO TABS: 10.0000 mg | ORAL_TABLET | Freq: Every evening | ORAL | 0 refills | Status: DC | PRN

## 2017-05-06 MED ORDER — METAXALONE 800 MG PO TABS: 800.0000 mg | ORAL_TABLET | Freq: Three times a day (TID) | ORAL | 0 refills | Status: DC | PRN

## 2017-05-06 NOTE — Telephone Encounter (Signed)
plz phone in Rare ambien use

## 2017-05-06 NOTE — Telephone Encounter (Signed)
Refill left on vm at pharmacy.  

## 2017-05-06 NOTE — Telephone Encounter (Signed)
[facility] skelaxin refill 09/14/2016. Ambien has never been filled by Dr Darnell Level. Last OV 03/2017-acute. pls advise

## 2017-05-10 DIAGNOSIS — M545 Low back pain: Secondary | ICD-10-CM | POA: Diagnosis not present

## 2017-05-10 DIAGNOSIS — M9903 Segmental and somatic dysfunction of lumbar region: Secondary | ICD-10-CM | POA: Diagnosis not present

## 2017-05-10 NOTE — Telephone Encounter (Signed)
Pt called and ins co will be sending a tier exception for  Metaxalone and pt is requesting a 90 day supply for the metaxalone due to reduced cost to pt and pharmacy should be sending prior auth for zolpidem. Pt request cb when completed and approved.

## 2017-05-11 NOTE — Telephone Encounter (Signed)
Placed PA in Dr. Synthia Innocent box.

## 2017-05-13 ENCOUNTER — Telehealth: Payer: Self-pay

## 2017-05-13 DIAGNOSIS — M9903 Segmental and somatic dysfunction of lumbar region: Secondary | ICD-10-CM | POA: Diagnosis not present

## 2017-05-13 DIAGNOSIS — M545 Low back pain: Secondary | ICD-10-CM | POA: Diagnosis not present

## 2017-05-13 NOTE — Telephone Encounter (Signed)
I am in town this week and working tomorrow.   Basic answer is probably - I would need to evaluate him myself first, but theoretically it could be done.

## 2017-05-13 NOTE — Telephone Encounter (Signed)
Pt left v/m requesting cb about Dr Grant Fontana suggesting a steroid injection.  I spoke with pt; pt is having pain in both hips; possible bursitis and arthritis. Difficult to sleep at night. Pt had an epidural in back 2 weeks ago and pt wants to know when could schedule appt to get steroid injections in his hips with Dr Lorelei Pont. Pt does not want to do an overload of steroids. Pt is aware Dr Lorelei Pont is out of office until next week.Please advise.

## 2017-05-13 NOTE — Telephone Encounter (Signed)
Mr. Porzio notified as instructed by telephone.  Appointment scheduled for 05/20/2017 at 2:00 pm with Dr. Lorelei Pont.

## 2017-05-13 NOTE — Telephone Encounter (Signed)
Jose with [employer] left v/m requesting cb for phone PA ref # [id number]. Fax # [phone].

## 2017-05-14 NOTE — Telephone Encounter (Signed)
Spoke with pt notifying him that Dr. Darnell Level is working on the form.  Also, pt is requesting 90 day supply due to cost.

## 2017-05-14 NOTE — Telephone Encounter (Signed)
Pt left v/m requesting cb to see if tier exception paperwork has been completed and has question about qty on a med.

## 2017-05-17 ENCOUNTER — Encounter: Payer: Self-pay | Admitting: *Deleted

## 2017-05-17 MED ORDER — METAXALONE 800 MG PO TABS: 800.0000 mg | ORAL_TABLET | Freq: Three times a day (TID) | ORAL | 0 refills | Status: DC | PRN

## 2017-05-17 NOTE — Telephone Encounter (Addendum)
Working on this - plz check with patient - what alternatives has he tried (other muscle relaxants)? Baclofen, flexeril (cyclobenzaprine), robaxin (methocarbamol)

## 2017-05-17 NOTE — Telephone Encounter (Signed)
Spoke with pt, says he has tried Flexeril but did not help.  Has not tried anything else.

## 2017-05-17 NOTE — Addendum Note (Signed)
Addended by: [name] on: 05/17/2017 05:49 PM   Modules accepted: Orders

## 2017-05-17 NOTE — Telephone Encounter (Signed)
Noted. Filled form and placed in my out box.

## 2017-05-18 NOTE — Telephone Encounter (Signed)
Last phoned in:  05/06/17, #30 Last OV:  03/30/17 Next OV:  07/06/17

## 2017-05-18 NOTE — Telephone Encounter (Signed)
plz verify with pharmacy that [place] Rx was received 05/06/2017.

## 2017-05-19 NOTE — Telephone Encounter (Signed)
Received fax from [employer] for approval of metaxalone from 05/18/17- 07/26/18.

## 2017-05-20 ENCOUNTER — Ambulatory Visit
Admission: RE | Admit: 2017-05-20 | Discharge: 2017-05-20 | Disposition: A | Discharge status: Home / Self Care | Payer: PPO | Source: Ambulatory Visit | Attending: Family Medicine | Admitting: Family Medicine

## 2017-05-20 ENCOUNTER — Ambulatory Visit (INDEPENDENT_AMBULATORY_CARE_PROVIDER_SITE_OTHER): Payer: PPO | Admitting: Family Medicine

## 2017-05-20 ENCOUNTER — Encounter: Payer: Self-pay | Admitting: Family Medicine

## 2017-05-20 ENCOUNTER — Ambulatory Visit (INDEPENDENT_AMBULATORY_CARE_PROVIDER_SITE_OTHER)
Admission: RE | Admit: 2017-05-20 | Discharge: 2017-05-20 | Disposition: A | Discharge status: Home / Self Care | Payer: PPO | Source: Ambulatory Visit | Attending: Family Medicine | Admitting: Family Medicine

## 2017-05-20 VITALS — BP 110/70 | HR 57 | Temp 98.2°F | Ht 69.0 in | Wt 268.5 lb

## 2017-05-20 DIAGNOSIS — M25551 Pain in right hip: Secondary | ICD-10-CM | POA: Diagnosis not present

## 2017-05-20 DIAGNOSIS — M7061 Trochanteric bursitis, right hip: Secondary | ICD-10-CM | POA: Diagnosis not present

## 2017-05-20 DIAGNOSIS — M25552 Pain in left hip: Secondary | ICD-10-CM

## 2017-05-20 DIAGNOSIS — M7062 Trochanteric bursitis, left hip: Secondary | ICD-10-CM | POA: Diagnosis not present

## 2017-05-20 DIAGNOSIS — M160 Bilateral primary osteoarthritis of hip: Secondary | ICD-10-CM | POA: Diagnosis not present

## 2017-05-20 MED ORDER — METHYLPREDNISOLONE ACETATE 40 MG/ML IJ SUSP
80.0000 mg | Freq: Once | INTRAMUSCULAR | Status: AC
  Administered 2017-05-20: 80 mg via INTRA_ARTICULAR

## 2017-05-20 NOTE — Progress Notes (Signed)
Dr. Frederico Hamman T. Dhruvan Gullion, MD, [facility] [employer] [place], [address] Phone: [phone] Fax: [phone]  05/20/2017  Patient: Aaron Mitchell, MRN: [id number], DOB: 02-28-1946, 71 y.o.  Primary Physician:  [name], MD   Chief Complaint  Patient presents with  . Hip Pain    Bilateral   Subjective:   Aaron Mitchell is a 71 y.o. very pleasant male patient who presents with the following:  As I review his chart, he has seen a handful of orthopedic and sports medicine providers in the last year including myself once a year ago, and he presents today courtesy of Dr. Rock Nephew who thought it might be able to help him with his hip pain.  He primarily is having pain on the lateral aspects of his hip and he has ongoing back pain that is treated by Dr. Sharlet Salina as well as chiropractic manipulation.  Saw Grant Fontana. Pain in the hip joint. Wakes up early in the morning. Wife using the massager a lot.  Pain is mostly posterior and lateral to the hip without pain in the groin region on either side with preserved rotational movement.   Past Medical History, Surgical History, Social History, Family History, Problem List, Medications, and Allergies have been reviewed and updated if relevant.  Patient Active Problem List   Diagnosis Date Noted  . Stage 3 severe COPD by GOLD classification ([place]) 03/31/2017  . Chest congestion 12/08/2016  . Moderate mitral insufficiency 09/14/2016  . Male hypogonadism   . Insomnia 05/12/2016  . OSA (obstructive sleep apnea) 05/12/2016  . Chronic back pain   . Degenerative disc disease, lumbar   . Left Achilles tendinitis 10/04/2015  . HLD (hyperlipidemia) 09/17/2015  . Benign prostatic hyperplasia with urinary obstruction 03/25/2015  . ED (erectile dysfunction) of organic origin 03/25/2015  . CMC arthritis, thumb, degenerative 01/19/2013  . Triggering of digit 01/19/2013  . Rotator cuff impingement  syndrome 10/20/2011  . Chronic gouty arthropathy 04/02/2011  . Severe obesity (BMI >= 40) ([place]) 04/02/2011  . Osteoarthritis 04/02/2011  . Allergic rhinitis, seasonal 04/02/2011  . Chronic shoulder pain 04/02/2011  . Prediabetes 03/10/2011  . Essential (primary) hypertension 03/10/2011    Past Medical History:  Diagnosis Date  . Achilles tendinitis    Left leg  . Borderline diabetes mellitus   . Childhood asthma   . Degenerative disc disease, lumbar    rec against surgery  . Erectile dysfunction   . GI bleed 12/2015   hospitalization  . Gout   . Hypertension   . Ileus ([place]) 12/2015   hospitalization   . Male hypogonadism    followed by urology on T injections ([place])  . Seasonal allergies    pollen  . Severe obesity (BMI >= 40) ([facility])   . Shoulder bursitis   . Sleep apnea    CPAP    Past Surgical History:  Procedure Laterality Date  . ADENOIDECTOMY    . CARPAL TUNNEL RELEASE Right 2015  . CATARACT EXTRACTION W/PHACO Right 07/15/2016   Procedure: CATARACT EXTRACTION PHACO AND INTRAOCULAR LENS PLACEMENT (IOC);  Surgeon: Leandrew Koyanagi, MD;  Location: [facility];  Service: Ophthalmology;  Laterality: Right;  TORIC sleep apnea  . CATARACT EXTRACTION W/PHACO Left 08/05/2016   Procedure: CATARACT EXTRACTION PHACO AND INTRAOCULAR LENS PLACEMENT (IOC) toric;  Surgeon: Leandrew Koyanagi, MD;  Location: [facility];  Service: Ophthalmology;  Laterality: Left;  left sleep apnea toric  . COLONOSCOPY  10/2012  rpt 5 yrs ([facility])  . EYE SURGERY     Lens replacement 07/12/2016,08/05/2016  . LUMBAR EPIDURAL INJECTION Bilateral 04/2017   S1 transforaminal ESI  . RHINOPLASTY    . TONSILLECTOMY  ?1954  . TRIGGER FINGER RELEASE Right 2013/10/25   4th    Social History   Social History  . Marital status: Married    Spouse name: N/A  . Number of children: N/A  . Years of education: N/A   Occupational History  . Not on file.   Social History Main  Topics  . Smoking status: Former Smoker    Quit date: 07/27/1978  . Smokeless tobacco: Never Used     Comment: Quit in Oct 26, 1978  . Alcohol use 3.0 oz/week    5 Shots of liquor per week     Comment:    . Drug use: No  . Sexual activity: Not on file   Other Topics Concern  . Not on file   Social History Narrative   Lives at home with wife [name]), dog passed away 10/26/15   1 grown child   Occ: retired Clinical biochemist, was Chief Technology Officer    Edu: 2 yrs college   Activity: no regular exercise   Diet: good water, fruits daily, splenda iced tea    Family History  Problem Relation Age of Onset  . Alcohol abuse Father   . Hypertension Father   . AVM Father 4       hemorrhagic stroke  . Diabetes Paternal Grandmother   . Cancer Neg Hx   . CAD Neg Hx     Allergies  Allergen Reactions  . Codeine Other (See Comments)    Other reaction(s): Other (See Comments) HEADACHES Causes headaches when he stops liquid Codeine-based products    Medication list reviewed and updated in full in [facility].  GEN: No fevers, chills. Nontoxic. Primarily MSK c/o today. MSK: Detailed in the HPI GI: tolerating PO intake without difficulty Neuro: No numbness, parasthesias, or tingling associated. Otherwise the pertinent positives of the ROS are noted above.   Objective:   BP 110/70   Pulse (!) 57   Temp 98.2 F (36.8 C) (Oral)   Ht 5\' 9"  (1.753 m)   Wt 268 lb 8 oz (121.8 kg)   BMI 39.65 kg/m    GEN: WDWN, NAD, Non-toxic, Alert & Oriented x 3 HEENT: Atraumatic, Normocephalic.  Ears and Nose: No external deformity. EXTR: No clubbing/cyanosis/edema NEURO: Normal gait.  PSYCH: Normally interactive. Conversant. Not depressed or anxious appearing.  Calm demeanor.   HIP EXAM: SIDE: b ROM: Abduction, Flexion, Internal and External range of motion: rotational movements of the hip are relatively preserved throughout bilaterally Pain with terminal IROM and EROM: no pain at the groin, but they do  cause some pain in the back. GTB: TTP B SLR: NEG Knees: No effusion FABER: NT REVERSE FABER: NT, neg Piriformis: NT at direct palpation Str: flexion: 5/5 abduction: 5/5 adduction: 5/5 Strength testing non-tender  Radiology: Dg Hips Bilat W Or W/o Pelvis Min 5 Views  Result Date: 05/20/2017 CLINICAL DATA:  Bilateral hip pain. EXAM: DG HIP (WITH OR WITHOUT PELVIS) 5+V BILAT COMPARISON:  None. FINDINGS: There is no evidence of hip fracture or dislocation. There are degenerative joint changes of bilateral hips with narrowed joint space and osteophyte formation. IMPRESSION: No acute fracture or dislocation. Mild degenerative joint changes of bilateral hips. Electronically Signed   By: Abelardo Diesel M.D.   On: 05/20/2017 15:33  Assessment and Plan:   Trochanteric bursitis of both hips  Right hip pain - Plan: DG HIPS BILAT W OR W/O PELVIS MIN 5 VIEWS, methylPREDNISolone acetate (DEPO-MEDROL) injection 80 mg, CANCELED: DG HIP UNILAT WITH PELVIS 2-3 VIEWS RIGHT  Left hip pain - Plan: DG HIP UNILAT WITH PELVIS 2-3 VIEWS LEFT, methylPREDNISolone acetate (DEPO-MEDROL) injection 80 mg  The patient's hip exam was not very impressive.  His x-rays are fairly unremarkable for osteoarthritic change for age.  He is having some significant trochanteric bursitis which is causing him discomfort, particularly at sleeping.  I think his primary issue is his back however. It is reasonable to try to help his bursitis for pain management.  Trochanteric Bursitis Injection, R Verbal consent obtained. Risks (including infection, potential atrophy), benefits, and alternatives reviewed. Greater trochanter sterilely prepped with Chloraprep. Ethyl Chloride used for anesthesia. 8 cc of Lidocaine 1% injected with 2 mL of Depo-Medrol 40 mg into trochanteric bursa at area of maximal tenderness at greater trochanter. Needle taken to bone to troch bursa, flows easily. Bursa massaged. No bleeding and no complications.  Decreased pain after injection. Needle: 22 gauge spinal needle   Trochanteric Bursitis Injection, L Verbal consent obtained. Risks (including infection, potential atrophy), benefits, and alternatives reviewed. Greater trochanter sterilely prepped with Chloraprep. Ethyl Chloride used for anesthesia. 8 cc of Lidocaine 1% injected with 2 mL of Depo-Medrol 40 mg into trochanteric bursa at area of maximal tenderness at greater trochanter. Needle taken to bone to troch bursa, flows easily. Bursa massaged. No bleeding and no complications. Decreased pain after injection. Needle: 22 gauge spinal needle   Follow-up: prn  Future Appointments Date Time Provider [place]  07/01/2017 9:00 AM Eustace Pen, LPN [facility]  85/27/7824 9:30 AM [name], MD [facility]    Meds ordered this encounter  Medications  . methylPREDNISolone acetate (DEPO-MEDROL) injection 80 mg  . methylPREDNISolone acetate (DEPO-MEDROL) injection 80 mg   Medications Discontinued During This Encounter  Medication Reason  . Vitamin D, Ergocalciferol, (DRISDOL) 50000 units CAPS capsule Completed Course   Orders Placed This Encounter  Procedures  . DG HIP UNILAT WITH PELVIS 2-3 VIEWS LEFT  . DG HIPS BILAT W OR W/O PELVIS MIN 5 VIEWS    Signed,  Sherrian Nunnelley T. Jaley Yan, MD   Allergies as of 05/20/2017      Reactions   Codeine Other (See Comments)   Other reaction(s): Other (See Comments) HEADACHES Causes headaches when he stops liquid Codeine-based products      Medication List       Accurate as of 05/20/17  6:29 PM. Always use your most recent med list.          albuterol 108 (90 Base) MCG/ACT inhaler Commonly known as:  PROVENTIL HFA;VENTOLIN HFA Inhale 2 puffs into the lungs every 6 (six) hours as needed for wheezing or shortness of breath.   allopurinol 300 MG tablet Commonly known as:  ZYLOPRIM TAKE 1 TABLET(300 MG) BY MOUTH DAILY   amLODipine 5 MG tablet Commonly known as:   NORVASC Take 1 tablet (5 mg total) by mouth 2 (two) times daily.   aspirin EC 81 MG tablet Take 81 mg by mouth daily.   atorvastatin 40 MG tablet Commonly known as:  LIPITOR Take 1 tablet (40 mg total) by mouth daily.   diclofenac 75 MG EC tablet Commonly known as:  VOLTAREN TAKE 1 TABLET BY MOUTH TWICE DAILY AS NEEDED. CAUTION: LONG-TERM USE CAN CAUSE KIDNEY PROBLEMS, RISK OF HEART ATTACK  diclofenac sodium 1 % Gel Commonly known as:  VOLTAREN Apply 2 g topically daily as needed. Apply to both hands.   fluticasone 50 MCG/ACT nasal spray Commonly known as:  FLONASE SHAKE LIQUID AND USE 2 SPRAYS IN EACH NOSTRIL DAILY   HYDROcodone-acetaminophen 10-500 MG tablet Commonly known as:  LORTAB Take 1 tablet by mouth every 8 (eight) hours as needed for pain.   L-Lysine 500 MG Tabs Take 500 mg by mouth daily.   levocetirizine 5 MG tablet Commonly known as:  XYZAL Take 1 tablet (5 mg total) by mouth every evening.   lisinopril-hydrochlorothiazide 20-12.5 MG tablet Commonly known as:  PRINZIDE,ZESTORETIC Take 1 tablet by mouth daily.   metaxalone 800 MG tablet Commonly known as:  SKELAXIN Take 1 tablet (800 mg total) by mouth 3 (three) times daily as needed for muscle spasms.   Multiple Vitamins tablet Take 1 tablet by mouth daily.   naproxen sodium 220 MG tablet Commonly known as:  ANAPROX Take 3 tablets (660 mg total) by mouth daily.   nitroGLYCERIN 0.2 mg/hr patch Commonly known as:  NITRODUR - Dosed in mg/24 hr USE 1/2 PATCH DAILY   testosterone cypionate 200 MG/ML injection Commonly known as:  DEPOTESTOSTERONE CYPIONATE Inject 100 mg into the muscle every 7 (seven) days. Use on Friday.   zolpidem 10 MG tablet Commonly known as:  AMBIEN Take 1 tablet (10 mg total) by mouth at bedtime as needed. For sleep.

## 2017-05-20 NOTE — Telephone Encounter (Signed)
Great - plz notify patient.

## 2017-05-21 NOTE — Telephone Encounter (Signed)
Spoke with pharmacy about Ambien rx. Says pt's plan does not cover, needs PA.  Started PA, key:  [id number]. Decision pending.[name] KitchenMarland Kitchen

## 2017-05-24 ENCOUNTER — Other Ambulatory Visit: Payer: Self-pay

## 2017-05-24 ENCOUNTER — Telehealth: Payer: Self-pay | Admitting: Family Medicine

## 2017-05-24 DIAGNOSIS — M545 Low back pain: Secondary | ICD-10-CM | POA: Diagnosis not present

## 2017-05-24 DIAGNOSIS — M9903 Segmental and somatic dysfunction of lumbar region: Secondary | ICD-10-CM | POA: Diagnosis not present

## 2017-05-24 MED ORDER — ALBUTEROL SULFATE HFA 108 (90 BASE) MCG/ACT IN AERS: 2.0000 | INHALATION_SPRAY | Freq: Four times a day (QID) | RESPIRATORY_TRACT | 3 refills | Status: DC | PRN

## 2017-05-24 NOTE — Telephone Encounter (Signed)
Copied from [place] #[id number]. Topic: Quick Communication - See Telephone Encounter >> May 24, 2017  9:16 AM Robina Ade, Helene Kelp D wrote: CRM for notification. See Telephone encounter for: 05/24/17. Patient needs refill on his albuterol inhaler with three refill on it. He would like to go and pick up the Rx when it's ready. Please call patient back, thanks.

## 2017-05-25 NOTE — Telephone Encounter (Signed)
Received faxed PA approval for Ambien effective from 05/21/2017 until 07/26/2018. Notified [facility].

## 2017-05-26 DIAGNOSIS — G4733 Obstructive sleep apnea (adult) (pediatric): Secondary | ICD-10-CM | POA: Diagnosis not present

## 2017-05-27 MED ORDER — ALBUTEROL SULFATE HFA 108 (90 BASE) MCG/ACT IN AERS: 2.0000 | INHALATION_SPRAY | Freq: Four times a day (QID) | RESPIRATORY_TRACT | 3 refills | Status: DC | PRN

## 2017-05-27 NOTE — Telephone Encounter (Signed)
Pt called back again 11/1 checking on status of refill.

## 2017-05-27 NOTE — Telephone Encounter (Signed)
Spoke with pt says he is requesting a printed/written rx for albuterol with 3 refills so he can mail to [place] because it is cheaper. Pt says he is aware Dr. Darnell Level is out of the office for a week and can wait until he returns. Not in a hurry. He asks that we cancel rx with [facility].  Spoke with [facility] to cancel refill per pt request. Says they will take care of it.

## 2017-05-27 NOTE — Telephone Encounter (Signed)
Printed.  Thanks.  

## 2017-05-28 ENCOUNTER — Other Ambulatory Visit: Payer: Self-pay | Admitting: Family Medicine

## 2017-05-28 MED ORDER — ALBUTEROL SULFATE HFA 108 (90 BASE) MCG/ACT IN AERS: 2.0000 | INHALATION_SPRAY | Freq: Four times a day (QID) | RESPIRATORY_TRACT | 3 refills | Status: DC | PRN

## 2017-05-28 NOTE — Telephone Encounter (Signed)
Spoke with pt notifying him rx is ready to pick up. [Placed rx at front office.]

## 2017-05-31 ENCOUNTER — Other Ambulatory Visit: Payer: Self-pay | Admitting: Family Medicine

## 2017-06-25 DIAGNOSIS — G4733 Obstructive sleep apnea (adult) (pediatric): Secondary | ICD-10-CM | POA: Diagnosis not present

## 2017-07-01 ENCOUNTER — Ambulatory Visit (INDEPENDENT_AMBULATORY_CARE_PROVIDER_SITE_OTHER): Payer: PPO

## 2017-07-01 VITALS — BP 118/70 | HR 63 | Temp 98.1°F | Ht 70.5 in | Wt 273.5 lb

## 2017-07-01 DIAGNOSIS — Z0000 Encounter for general adult medical examination without abnormal findings: Secondary | ICD-10-CM | POA: Diagnosis not present

## 2017-07-01 NOTE — Patient Instructions (Addendum)
Aaron Mitchell , Thank you for taking time to come for your Medicare Wellness Visit. I appreciate your ongoing commitment to your health goals. Please review the following plan we discussed and let me know if I can assist you in the future.   These are the goals we discussed: Goals    . Increase physical activity     Starting Jan 2019, I will attempt to exercise at gym for at least 2 hours 3 days per week.        This is a list of the screening recommended for you and due dates:  Health Maintenance  Topic Date Due  . DTaP/Tdap/Td vaccine (1 - Tdap) 08/23/2024*  . Colon Cancer Screening  09/25/2022  . Tetanus Vaccine  08/23/2024  . Flu Shot  Completed  .  Hepatitis C: One time screening is recommended by Center for Disease Control  (CDC) for  adults born from [birth year] through 1963.   Completed  . Pneumonia vaccines  Completed  *Topic was postponed. The date shown is not the original due date.   Preventive Care for Adults  A healthy lifestyle and preventive care can promote health and wellness. Preventive health guidelines for adults include the following key practices.  . A routine yearly physical is a good way to check with your health care provider about your health and preventive screening. It is a chance to share any concerns and updates on your health and to receive a thorough exam.  . Visit your dentist for a routine exam and preventive care every 6 months. Brush your teeth twice a day and floss once a day. Good oral hygiene prevents tooth decay and gum disease.  . The frequency of eye exams is based on your age, health, family medical history, use  of contact lenses, and other factors. Follow your health care provider's recommendations for frequency of eye exams.  . Eat a healthy diet. Foods like vegetables, fruits, whole grains, low-fat dairy products, and lean protein foods contain the nutrients you need without too many calories. Decrease your intake of foods high in solid  fats, added sugars, and salt. Eat the right amount of calories for you. Get information about a proper diet from your health care provider, if necessary.  . Regular physical exercise is one of the most important things you can do for your health. Most adults should get at least 150 minutes of moderate-intensity exercise (any activity that increases your heart rate and causes you to sweat) each week. In addition, most adults need muscle-strengthening exercises on 2 or more days a week.  Silver Sneakers may be a benefit available to you. To determine eligibility, you may visit the website: www.silversneakers.com or contact program at 229-328-8932 Mon-Fri between 8AM-8PM.   . Maintain a healthy weight. The body mass index (BMI) is a screening tool to identify possible weight problems. It provides an estimate of body fat based on height and weight. Your health care provider can find your BMI and can help you achieve or maintain a healthy weight.   For adults 20 years and older: ? A BMI below 18.5 is considered underweight. ? A BMI of 18.5 to 24.9 is normal. ? A BMI of 25 to 29.9 is considered overweight. ? A BMI of 30 and above is considered obese.   . Maintain normal blood lipids and cholesterol levels by exercising and minimizing your intake of saturated fat. Eat a balanced diet with plenty of fruit and vegetables. Blood tests for lipids and  cholesterol should begin at age 7 and be repeated every 5 years. If your lipid or cholesterol levels are high, you are over 50, or you are at high risk for heart disease, you may need your cholesterol levels checked more frequently. Ongoing high lipid and cholesterol levels should be treated with medicines if diet and exercise are not working.  . If you smoke, find out from your health care provider how to quit. If you do not use tobacco, please do not start.  . If you choose to drink alcohol, please do not consume more than 2 drinks per day. One drink is  considered to be 12 ounces (355 mL) of beer, 5 ounces (148 mL) of wine, or 1.5 ounces (44 mL) of liquor.  . If you are 69-87 years old, ask your health care provider if you should take aspirin to prevent strokes.  . Use sunscreen. Apply sunscreen liberally and repeatedly throughout the day. You should seek shade when your shadow is shorter than you. Protect yourself by wearing long sleeves, pants, a wide-brimmed hat, and sunglasses year round, whenever you are outdoors.  . Once a month, do a whole body skin exam, using a mirror to look at the skin on your back. Tell your health care provider of new moles, moles that have irregular borders, moles that are larger than a pencil eraser, or moles that have changed in shape or color.

## 2017-07-01 NOTE — Progress Notes (Signed)
PCP notes:   Health maintenance:  Hep C screening - per pt, screening was in June 2018  Abnormal screenings:   Mini-Cog score: 18/20  Patient concerns:   None  Nurse concerns:  None  Next PCP appt:   07/06/17 @ 0930

## 2017-07-01 NOTE — Progress Notes (Signed)
Pre visit review using our clinic review tool, if applicable. No additional management support is needed unless otherwise documented below in the visit note. 

## 2017-07-01 NOTE — Progress Notes (Signed)
Subjective:   Aaron Mitchell is a 71 y.o. male who presents for Medicare Annual/Subsequent preventive examination.  Review of Systems:  N/A Cardiac Risk Factors include: advanced age (>17men, >22 women);male gender;obesity (BMI >30kg/m2)     Objective:    Vitals: BP 118/70 (BP Location: Right Arm, Patient Position: Sitting, Cuff Size: Large)   Pulse 63   Temp 98.1 F (36.7 C) (Oral)   Ht 5' 10.5" (1.791 m) Comment: shoes  Wt 273 lb 8 oz (124.1 kg)   SpO2 97%   BMI 38.69 kg/m   Body mass index is 38.69 kg/m.  Advanced Directives 07/01/2017 08/05/2016 07/15/2016 12/28/2015 12/28/2015  Does Patient Have a Medical Advance Directive? Yes Yes Yes Yes No  Type of Paramedic of [place];Living will [place];Living will [employer];Living will [place];Living will -  Does patient want to make changes to medical advance directive? - - - No - Patient declined -  Copy of [place] in Chart? No - copy requested Yes Yes Yes -  Would patient like information on creating a medical advance directive? - - - No - patient declined information No - patient declined information    Tobacco Social History   Tobacco Use  Smoking Status Former Smoker  . Last attempt to quit: 07/27/1978  . Years since quitting: 38.9  Smokeless Tobacco Never Used  Tobacco Comment   Quit in 1980     Counseling given: No Comment: Quit in 1980   Clinical Intake:  Pre-visit preparation completed: Yes  Pain : 0-10 Pain Score: 3  Pain Type: Chronic pain Pain Location: Back(bilateral hands) Pain Orientation: Lower Pain Descriptors / Indicators: Aching Pain Onset: More than a month ago Pain Frequency: Constant     Nutritional Status: BMI > 30  Obese Nutritional Risks: None Diabetes: No  Activities of Daily Living: Independent Ambulation: Independent with device- listed below Home Assistive Devices/Equipment:  Eyeglasses, CPAP Medication Administration: Independent Home Management: Independent  Barriers to Care Management & Learning: None  Do you feel unsafe in your current relationship?: No Do you feel physically threatened by others?: No Anyone hurting you at home, work, or school?: No Unable to ask?: No Information provided on Community resources: No  How often do you need to have someone help you when you read instructions, pamphlets, or other written materials from your doctor or pharmacy?: 1 - Never What is the last grade level you completed in school?: 12th grade + 2 yrs college  Interpreter Needed?: No  Comments: pt lives with spouse Information entered by :: LPinson, LPN  Past Medical History:  Diagnosis Date  . Achilles tendinitis    Left leg  . Borderline diabetes mellitus   . Childhood asthma   . Degenerative disc disease, lumbar    rec against surgery  . Erectile dysfunction   . GI bleed 12/2015   hospitalization  . Gout   . Hypertension   . Ileus ([place]) 12/2015   hospitalization   . Male hypogonadism    followed by urology on T injections ([place])  . Seasonal allergies    pollen  . Severe obesity (BMI >= 40) (HCC)   . Shoulder bursitis   . Sleep apnea    CPAP   Past Surgical History:  Procedure Laterality Date  . ADENOIDECTOMY    . CARPAL TUNNEL RELEASE Right 2015  . CATARACT EXTRACTION W/PHACO Right 07/15/2016   Procedure: CATARACT EXTRACTION PHACO AND INTRAOCULAR LENS  PLACEMENT (IOC);  Surgeon: Leandrew Koyanagi, MD;  Location: [facility];  Service: Ophthalmology;  Laterality: Right;  TORIC sleep apnea  . CATARACT EXTRACTION W/PHACO Left 08/05/2016   Procedure: CATARACT EXTRACTION PHACO AND INTRAOCULAR LENS PLACEMENT (IOC) toric;  Surgeon: Leandrew Koyanagi, MD;  Location: [facility];  Service: Ophthalmology;  Laterality: Left;  left sleep apnea toric  . COLONOSCOPY  10/2012   rpt 5 yrs ([facility])  . EYE SURGERY     Lens  replacement 07/12/2016,08/05/2016  . LUMBAR EPIDURAL INJECTION Bilateral 04/2017   S1 transforaminal ESI  . RHINOPLASTY    . TONSILLECTOMY  ?1954  . TRIGGER FINGER RELEASE Right 11-19-2013   4th   Family History  Problem Relation Age of Onset  . Alcohol abuse Father   . Hypertension Father   . AVM Father 79       hemorrhagic stroke  . Diabetes Paternal Grandmother   . Cancer Neg Hx   . CAD Neg Hx    Social History   Socioeconomic History  . Marital status: Married    Spouse name: None  . Number of children: None  . Years of education: None  . Highest education level: None  Social Needs  . Financial resource strain: None  . Food insecurity - worry: None  . Food insecurity - inability: None  . Transportation needs - medical: None  . Transportation needs - non-medical: None  Occupational History  . None  Tobacco Use  . Smoking status: Former Smoker    Last attempt to quit: 07/27/1978    Years since quitting: 38.9  . Smokeless tobacco: Never Used  . Tobacco comment: Quit in 20-Nov-1978  Substance and Sexual Activity  . Alcohol use: Yes    Alcohol/week: 3.0 oz    Types: 5 Shots of liquor per week    Comment:    . Drug use: No  . Sexual activity: None  Other Topics Concern  . None  Social History Narrative   Lives at home with wife [name]), dog passed away 11/20/15   1 grown child   Occ: retired Clinical biochemist, was Chief Technology Officer    Edu: 2 yrs college   Activity: no regular exercise   Diet: good water, fruits daily, splenda iced tea    Outpatient Encounter Medications as of 07/01/2017  Medication Sig  . albuterol (PROVENTIL HFA;VENTOLIN HFA) 108 (90 Base) MCG/ACT inhaler Inhale 2 puffs into the lungs every 6 (six) hours as needed for wheezing or shortness of breath.  . allopurinol (ZYLOPRIM) 300 MG tablet TAKE 1 TABLET(300 MG) BY MOUTH DAILY  . amLODipine (NORVASC) 5 MG tablet Take 1 tablet (5 mg total) by mouth 2 (two) times daily.  [name] Kitchen aspirin EC 81 MG tablet Take 81 mg by mouth  daily.  [name] Kitchen atorvastatin (LIPITOR) 40 MG tablet Take 1 tablet (40 mg total) by mouth daily.  . diclofenac (VOLTAREN) 75 MG EC tablet TAKE 1 TABLET BY MOUTH TWICE DAILY AS NEEDED. CAUTION: LONG-TERM USE CAN CAUSE KIDNEY PROBLEMS, RISK OF HEART ATTACK  . diclofenac sodium (VOLTAREN) 1 % GEL Apply 2 g topically daily as needed. Apply to both hands.  . fluticasone (FLONASE) 50 MCG/ACT nasal spray SHAKE LIQUID AND USE 2 SPRAYS IN EACH NOSTRIL DAILY  . HYDROcodone-acetaminophen (LORTAB) 10-500 MG tablet Take 1 tablet by mouth every 8 (eight) hours as needed for pain.  [name] Kitchen levocetirizine (XYZAL) 5 MG tablet Take 1 tablet (5 mg total) by mouth every evening.  [name] Kitchen lisinopril-hydrochlorothiazide (PRINZIDE,ZESTORETIC) 20-12.5  MG tablet Take 1 tablet by mouth daily.  . metaxalone (SKELAXIN) 800 MG tablet Take 1 tablet (800 mg total) by mouth 3 (three) times daily as needed for muscle spasms.  . Multiple Vitamins tablet Take 1 tablet by mouth daily.  . naproxen sodium (ANAPROX) 220 MG tablet Take 440 mg by mouth daily.   . nitroGLYCERIN (NITRODUR - DOSED IN MG/24 HR) 0.2 mg/hr patch USE 1/2 PATCH DAILY  . sildenafil (VIAGRA) 100 MG tablet Take by mouth.  . tadalafil (CIALIS) 5 MG tablet Take by mouth.  . testosterone cypionate (DEPOTESTOSTERONE CYPIONATE) 200 MG/ML injection Inject 100 mg into the muscle every 7 (seven) days. Use on Friday.  . zolpidem (AMBIEN) 10 MG tablet Take 1 tablet (10 mg total) by mouth at bedtime as needed. For sleep.  . [DISCONTINUED] allopurinol (ZYLOPRIM) 300 MG tablet TAKE 1 TABLET(300 MG) BY MOUTH DAILY  . [DISCONTINUED] L-Lysine 500 MG TABS Take 500 mg by mouth daily.   No facility-administered encounter medications on file as of 07/01/2017.     Activities of Daily Living In your present state of health, do you have any difficulty performing the following activities: 07/01/2017 08/05/2016  Hearing? N N  Vision? N N  Difficulty concentrating or making decisions? N N  Walking or  climbing stairs? N N  Dressing or bathing? N N  Doing errands, shopping? N -  Preparing Food and eating ? N -  Using the Toilet? N -  In the past six months, have you accidently leaked urine? N -  Do you have problems with loss of bowel control? N -  Managing your Medications? N -  Managing your Finances? N -  Housekeeping or managing your Housekeeping? N -  Some recent data might be hidden    Patient Care Team: [name], MD as PCP - General (Family Medicine) Emmaline Kluver., MD (Rheumatology)   Assessment:     Hearing Screening   125Hz  250Hz  500Hz  1000Hz  2000Hz  3000Hz  4000Hz  6000Hz  8000Hz   Right ear:   40 40 40  40    Left ear:   40 40 40  40    Vision Screening Comments: Last vision exam in July 2018 with Dr. Wallace Going  Exercise Activities and Dietary recommendations Current Exercise Habits: The patient does not participate in regular exercise at present, Exercise limited by: orthopedic condition(s)  Goals    . Increase physical activity     Starting Jan 2019, I will attempt to exercise at gym for at least 2 hours 3 days per week.       Fall Risk Fall Risk  07/01/2017 03/30/2017  Falls in the past year? No No  Depression Screen PHQ 2/9 Scores 07/01/2017 03/30/2017  PHQ - 2 Score 0 0  PHQ- 9 Score 0 -    Cognitive Function MMSE - Mini Mental State Exam 07/01/2017  Orientation to time 5  Orientation to Place 5  Registration 3  Attention/ Calculation 0  Recall 1  Recall-comments unable to recall 2 of 3 words  Language- name 2 objects 0  Language- repeat 1  Language- follow 3 step command 3  Language- read & follow direction 0  Write a sentence 0  Copy design 0  Total score 18       PLEASE NOTE: A Mini-Cog screen was completed. Maximum score is 20. A value of 0 denotes this part of [place] MMSE was not completed or the patient failed this part of the Mini-Cog screening.   Mini-Cog  Screening Orientation to Time - Max 5 pts Orientation to Place -  Max 5 pts Registration - Max 3 pts Recall - Max 3 pts Language Repeat - Max 1 pts Language Follow 3 Step Command - Max 3 pts   Immunization History  Administered Date(s) Administered  . Influenza, High Dose Seasonal PF 05/14/2017  . Influenza-Unspecified 08/27/2016  . Pneumococcal Conjugate-13 08/23/2014  . Pneumococcal Polysaccharide-23 03/26/2016  . Td 08/23/2014  . Zoster 08/08/2013   Screening Tests Health Maintenance  Topic Date Due  . DTaP/Tdap/Td (1 - Tdap) 08/23/2024 (Originally 08/24/2014)  . COLONOSCOPY  09/25/2022  . TETANUS/TDAP  08/23/2024  . INFLUENZA VACCINE  Completed  . Hepatitis C Screening  Completed  . PNA vac Low Risk Adult  Completed     Plan:     I have personally reviewed, addressed, and noted the following in the patient's chart:  A. Medical and social history B. Use of alcohol, tobacco or illicit drugs  C. Current medications and supplements D. Functional ability and status E.  Nutritional status F.  Physical activity G. Advance directives H. List of other physicians I.  Hospitalizations, surgeries, and ER visits in previous 12 months J.  [place] to include hearing, vision, cognitive, depression L. Referrals and appointments - none  In addition, I have reviewed and discussed with patient certain preventive protocols, quality metrics, and best practice recommendations. A written personalized care plan for preventive services as well as general preventive health recommendations were provided to patient.  See attached scanned questionnaire for additional information.   Signed,   Lindell Noe, MHA, BS, LPN Health Coach

## 2017-07-05 ENCOUNTER — Encounter: Payer: Self-pay | Admitting: Family Medicine

## 2017-07-06 ENCOUNTER — Encounter: Payer: PPO | Admitting: Family Medicine

## 2017-07-07 ENCOUNTER — Telehealth: Payer: Self-pay

## 2017-07-07 MED ORDER — ATORVASTATIN CALCIUM 40 MG PO TABS: 40.0000 mg | ORAL_TABLET | Freq: Every day | ORAL | 0 refills | Status: DC

## 2017-07-07 NOTE — Progress Notes (Signed)
I reviewed health advisor's note, was available for consultation, and agree with documentation and plan.  

## 2017-07-07 NOTE — Telephone Encounter (Signed)
Pt is requesting 90 day rx due to insurance.  Sent rx to pharmacy. Pt has OV 07/14/17.

## 2017-07-07 NOTE — Telephone Encounter (Signed)
Sent 90 day rx to pharmacy.   Forward message to Dr. Darnell Level as Juluis Rainier.

## 2017-07-12 ENCOUNTER — Telehealth: Payer: Self-pay

## 2017-07-12 NOTE — Telephone Encounter (Signed)
PLEASE NOTE: All timestamps contained within this report are represented as [place] Standard Time. CONFIDENTIALTY NOTICE: This fax transmission is intended only for the addressee. It contains information that is legally privileged, confidential or otherwise protected from use or disclosure. If you are not the intended recipient, you are strictly prohibited from reviewing, disclosing, copying using or disseminating any of this information or taking any action in reliance on or regarding this information. If you have received this fax in error, please notify us immediately by telephone so that we can arrange for its return to [place]. Phone: [phone], Toll-Free: [phone], Fax: [phone] Page: 1 of 1 Call Id: [id number] [place] Night - Client Nonclinical Telephone Record [place] Night - Client Client Site [place] - Night Contact Type Call Who Is Calling Patient / Member / Family / Caregiver Caller Name Abdulai Blaylock Phone Number [phone] Call Type Message Only Information Provided Reason for Call Returning a Call from the Office Initial [place] is returning missed call on his phone. He was has and appointment is next Thursday. Additional Comment Call Closed By: Corky Downs Transaction Date/Time: 07/09/2017 6:16:16 PM (ET)

## 2017-07-12 NOTE — Telephone Encounter (Signed)
Spoke with pt informing him of OV Wed, 07/14/17 @ 4:00 PM.  Aaron Mitchell had already called him.

## 2017-07-14 ENCOUNTER — Other Ambulatory Visit: Payer: Self-pay

## 2017-07-14 ENCOUNTER — Encounter: Payer: Self-pay | Admitting: Family Medicine

## 2017-07-14 ENCOUNTER — Ambulatory Visit (INDEPENDENT_AMBULATORY_CARE_PROVIDER_SITE_OTHER): Payer: PPO | Admitting: Family Medicine

## 2017-07-14 VITALS — BP 120/60 | HR 60 | Temp 98.2°F | Ht 71.0 in | Wt 275.0 lb

## 2017-07-14 DIAGNOSIS — J449 Chronic obstructive pulmonary disease, unspecified: Secondary | ICD-10-CM

## 2017-07-14 DIAGNOSIS — M549 Dorsalgia, unspecified: Secondary | ICD-10-CM

## 2017-07-14 DIAGNOSIS — E785 Hyperlipidemia, unspecified: Secondary | ICD-10-CM

## 2017-07-14 DIAGNOSIS — I10 Essential (primary) hypertension: Secondary | ICD-10-CM | POA: Diagnosis not present

## 2017-07-14 DIAGNOSIS — Z0000 Encounter for general adult medical examination without abnormal findings: Secondary | ICD-10-CM | POA: Diagnosis not present

## 2017-07-14 DIAGNOSIS — I340 Nonrheumatic mitral (valve) insufficiency: Secondary | ICD-10-CM

## 2017-07-14 DIAGNOSIS — G8929 Other chronic pain: Secondary | ICD-10-CM | POA: Diagnosis not present

## 2017-07-14 DIAGNOSIS — Z7189 Other specified counseling: Secondary | ICD-10-CM | POA: Diagnosis not present

## 2017-07-14 DIAGNOSIS — E291 Testicular hypofunction: Secondary | ICD-10-CM | POA: Diagnosis not present

## 2017-07-14 MED ORDER — TIOTROPIUM BROMIDE MONOHYDRATE 18 MCG IN CAPS: 18.0000 ug | ORAL_CAPSULE | Freq: Every day | RESPIRATORY_TRACT | 6 refills | Status: DC

## 2017-07-14 MED ORDER — TRAMADOL HCL 50 MG PO TABS: 50.0000 mg | ORAL_TABLET | Freq: Three times a day (TID) | ORAL | 0 refills | Status: DC | PRN

## 2017-07-14 NOTE — Patient Instructions (Addendum)
If interested, check with pharmacy about new 2 shot shingles series (shingrix).  Bring me copy of living will to update chart.  Return as needed or in 6 months for follow up visit.  Trial spiriva for breathing and COPD - printed out today You are doing well today.   Health Maintenance, Male A healthy lifestyle and preventive care is important for your health and wellness. Ask your health care provider about what schedule of regular examinations is right for you. What should I know about weight and diet? Eat a Healthy Diet  Eat plenty of vegetables, fruits, whole grains, low-fat dairy products, and lean protein.  Do not eat a lot of foods high in solid fats, added sugars, or salt.  Maintain a Healthy Weight Regular exercise can help you achieve or maintain a healthy weight. You should:  Do at least 150 minutes of exercise each week. The exercise should increase your heart rate and make you sweat (moderate-intensity exercise).  Do strength-training exercises at least twice a week.  Watch Your Levels of Cholesterol and Blood Lipids  Have your blood tested for lipids and cholesterol every 5 years starting at 69 years of age. If you are at high risk for heart disease, you should start having your blood tested when you are 69 years old. You may need to have your cholesterol levels checked more often if: ? Your lipid or cholesterol levels are high. ? You are older than 69 years of age. ? You are at high risk for heart disease.  What should I know about cancer screening? Many types of cancers can be detected early and may often be prevented. Lung Cancer  You should be screened every year for lung cancer if: ? You are a current smoker who has smoked for at least 30 years. ? You are a former smoker who has quit within the past 15 years.  Talk to your health care provider about your screening options, when you should start screening, and how often you should be screened.  Colorectal  Cancer  Routine colorectal cancer screening usually begins at 69 years of age and should be repeated every 5-10 years until you are 69 years old. You may need to be screened more often if early forms of precancerous polyps or small growths are found. Your health care provider may recommend screening at an earlier age if you have risk factors for colon cancer.  Your health care provider may recommend using home test kits to check for hidden blood in the stool.  A small camera at the end of a tube can be used to examine your colon (sigmoidoscopy or colonoscopy). This checks for the earliest forms of colorectal cancer.  Prostate and Testicular Cancer  Depending on your age and overall health, your health care provider may do certain tests to screen for prostate and testicular cancer.  Talk to your health care provider about any symptoms or concerns you have about testicular or prostate cancer.  Skin Cancer  Check your skin from head to toe regularly.  Tell your health care provider about any new moles or changes in moles, especially if: ? There is a change in a mole's size, shape, or color. ? You have a mole that is larger than a pencil eraser.  Always use sunscreen. Apply sunscreen liberally and repeat throughout the day.  Protect yourself by wearing long sleeves, pants, a wide-brimmed hat, and sunglasses when outside.  What should I know about heart disease, diabetes, and high blood  pressure?  If you are 82-86 years of age, have your blood pressure checked every 3-5 years. If you are 57 years of age or older, have your blood pressure checked every year. You should have your blood pressure measured twice-once when you are at a hospital or clinic, and once when you are not at a hospital or clinic. Record the average of the two measurements. To check your blood pressure when you are not at a hospital or clinic, you can use: ? An automated blood pressure machine at a pharmacy. ? A home blood  pressure monitor.  Talk to your health care provider about your target blood pressure.  If you are between 35-35 years old, ask your health care provider if you should take aspirin to prevent heart disease.  Have regular diabetes screenings by checking your fasting blood sugar level. ? If you are at a normal weight and have a low risk for diabetes, have this test once every three years after the age of 22. ? If you are overweight and have a high risk for diabetes, consider being tested at a younger age or more often.  A one-time screening for abdominal aortic aneurysm (AAA) by ultrasound is recommended for men aged 35-75 years who are current or former smokers. What should I know about preventing infection? Hepatitis B If you have a higher risk for hepatitis B, you should be screened for this virus. Talk with your health care provider to find out if you are at risk for hepatitis B infection. Hepatitis C Blood testing is recommended for:  Everyone born from [birth year] through 1963.  Anyone with known risk factors for hepatitis C.  Sexually Transmitted Diseases (STDs)  You should be screened each year for STDs including gonorrhea and chlamydia if: ? You are sexually active and are younger than 69 years of age. ? You are older than 69 years of age and your health care provider tells you that you are at risk for this type of infection. ? Your sexual activity has changed since you were last screened and you are at an increased risk for chlamydia or gonorrhea. Ask your health care provider if you are at risk.  Talk with your health care provider about whether you are at high risk of being infected with HIV. Your health care provider may recommend a prescription medicine to help prevent HIV infection.  What else can I do?  Schedule regular health, dental, and eye exams.  Stay current with your vaccines (immunizations).  Do not use any tobacco products, such as cigarettes, chewing tobacco, and  e-cigarettes. If you need help quitting, ask your health care provider.  Limit alcohol intake to no more than 2 drinks per day. One drink equals 12 ounces of beer, 5 ounces of wine, or 1 ounces of hard liquor.  Do not use street drugs.  Do not share needles.  Ask your health care provider for help if you need support or information about quitting drugs.  Tell your health care provider if you often feel depressed.  Tell your health care provider if you have ever been abused or do not feel safe at home. This information is not intended to replace advice given to you by your health care provider. Make sure you discuss any questions you have with your health care provider. Document Released: 01/09/2008 Document Revised: 03/11/2016 Document Reviewed: 04/16/2015 Elsevier Interactive Patient Education  [employer].

## 2017-07-14 NOTE — Progress Notes (Signed)
BP 120/60 (BP Location: Left Arm, Patient Position: Sitting, Cuff Size: Large)   Pulse 60   Temp 98.2 F (36.8 C) (Oral)   Ht 5\' 11"  (1.803 m)   Wt 275 lb (124.7 kg)   SpO2 97%   BMI 38.35 kg/m    CC: CPE Subjective:    Patient ID: Aaron Mitchell, male    DOB: Feb 07, 1946, 71 y.o.   MRN: [id number]  HPI: Aaron Mitchell is a 71 y.o. male presenting on 07/14/2017 for Annual Exam (Pt 2)   Saw Katha Cabal last week for medicare wellness visit. Note reviewed.   Chronic back and joint pain - finds ultram 100mg  ER works better than hydrocodone - less constipating and less loopy feeling. Requests refill.  Ongoing exertional dyspnea.   Preventative: COLONOSCOPY 10/2012 rpt 5 yrs [facility]) Prostate cancer screening - sees urologist Lung cancer screening - ex smoker quit 1980 Flu shot yearly Td 10-30-14 prevnar Oct 30, 2014, pneumovax 10-30-15 zostavax 10-29-13 shingrix - discussed Advanced directive discussion - has at home. Daughter and wife are HCPOA. [place] with temporary life support but not prolonged if terminal condition. Unsure about feeding tube. Will bring me copy.  Seat belt use discussed Sunscreen use and skin screen discussed  Ex smoker remotely quit 1980. 2.5 ppd x 15 yrs Alcohol - infrequent  Lives at home with wife [name]), dog passed away 2015-10-30 1 grown child Occ: retired Clinical biochemist, was Chief Technology Officer  Edu: 2 yrs college Activity: no regular exercise  Diet: good water, fruits daily, splenda iced tea   Relevant past medical, surgical, family and social history reviewed and updated as indicated. Interim medical history since our last visit reviewed. Allergies and medications reviewed and updated. Outpatient Medications Prior to Visit  Medication Sig Dispense Refill  . albuterol (PROVENTIL HFA;VENTOLIN HFA) 108 (90 Base) MCG/ACT inhaler Inhale 2 puffs into the lungs every 6 (six) hours as needed for wheezing or shortness of breath. 3 Inhaler 3  . allopurinol (ZYLOPRIM) 300 MG tablet  TAKE 1 TABLET(300 MG) BY MOUTH DAILY 90 tablet 3  . amLODipine (NORVASC) 5 MG tablet Take 1 tablet (5 mg total) by mouth 2 (two) times daily. 180 tablet 3  . aspirin EC 81 MG tablet Take 81 mg by mouth daily.    [name] Kitchen atorvastatin (LIPITOR) 40 MG tablet Take 1 tablet (40 mg total) by mouth daily. 90 tablet 0  . diclofenac (VOLTAREN) 75 MG EC tablet TAKE 1 TABLET BY MOUTH TWICE DAILY AS NEEDED. CAUTION: LONG-TERM USE CAN CAUSE KIDNEY PROBLEMS, RISK OF HEART ATTACK 60 tablet 0  . diclofenac sodium (VOLTAREN) 1 % GEL Apply 2 g topically daily as needed. Apply to both hands.    . fluticasone (FLONASE) 50 MCG/ACT nasal spray SHAKE LIQUID AND USE 2 SPRAYS IN EACH NOSTRIL DAILY 16 g 6  . levocetirizine (XYZAL) 5 MG tablet Take 1 tablet (5 mg total) by mouth every evening. 90 tablet 3  . lisinopril-hydrochlorothiazide (PRINZIDE,ZESTORETIC) 20-12.5 MG tablet Take 1 tablet by mouth daily. 90 tablet 3  . metaxalone (SKELAXIN) 800 MG tablet Take 1 tablet (800 mg total) by mouth 3 (three) times daily as needed for muscle spasms. 270 tablet 0  . Multiple Vitamins tablet Take 1 tablet by mouth daily.    . naproxen sodium (ANAPROX) 220 MG tablet Take 440 mg by mouth daily.     . nitroGLYCERIN (NITRODUR - DOSED IN MG/24 HR) 0.2 mg/hr patch USE 1/2 PATCH DAILY 30 patch 6  . sildenafil (VIAGRA) 100 MG  tablet Take by mouth.    . tadalafil (CIALIS) 5 MG tablet Take by mouth.    . testosterone cypionate (DEPOTESTOSTERONE CYPIONATE) 200 MG/ML injection Inject 100 mg into the muscle every 7 (seven) days. Use on Friday.    . zolpidem (AMBIEN) 10 MG tablet Take 1 tablet (10 mg total) by mouth at bedtime as needed. For sleep. 30 tablet 0  . traMADol (ULTRAM-ER) 100 MG 24 hr tablet Take 100 mg by mouth daily as needed for pain.    [name] Kitchen HYDROcodone-acetaminophen (LORTAB) 10-500 MG tablet Take 1 tablet by mouth every 8 (eight) hours as needed for pain.     No facility-administered medications prior to visit.      Per HPI unless  specifically indicated in ROS section below Review of Systems  Constitutional: Negative for activity change, appetite change, chills, fatigue, fever and unexpected weight change.  HENT: Negative for hearing loss.   Eyes: Negative for visual disturbance.  Respiratory: Positive for cough, chest tightness, shortness of breath and wheezing.   Cardiovascular: Negative for chest pain, palpitations and leg swelling.  Gastrointestinal: Negative for abdominal distention, abdominal pain, blood in stool, constipation, diarrhea, nausea and vomiting.  Genitourinary: Negative for difficulty urinating and hematuria.  Musculoskeletal: Negative for arthralgias, myalgias and neck pain.  Skin: Negative for rash.  Neurological: Negative for dizziness, seizures, syncope and headaches.  Hematological: Negative for adenopathy. Bruises/bleeds easily.  Psychiatric/Behavioral: Negative for dysphoric mood. The patient is not nervous/anxious.        Objective:    BP 120/60 (BP Location: Left Arm, Patient Position: Sitting, Cuff Size: Large)   Pulse 60   Temp 98.2 F (36.8 C) (Oral)   Ht 5\' 11"  (1.803 m)   Wt 275 lb (124.7 kg)   SpO2 97%   BMI 38.35 kg/m   Wt Readings from Last 3 Encounters:  07/14/17 275 lb (124.7 kg)  07/01/17 273 lb 8 oz (124.1 kg)  05/20/17 268 lb 8 oz (121.8 kg)    Physical Exam  Constitutional: He is oriented to person, place, and time. He appears well-developed and well-nourished. No distress.  HENT:  Head: Normocephalic and atraumatic.  Right Ear: Hearing, tympanic membrane, external ear and ear canal normal.  Left Ear: Hearing, tympanic membrane, external ear and ear canal normal.  Nose: Nose normal.  Mouth/Throat: Uvula is midline, oropharynx is clear and moist and mucous membranes are normal. No oropharyngeal exudate, posterior oropharyngeal edema or posterior oropharyngeal erythema.  Eyes: Conjunctivae and EOM are normal. Pupils are equal, round, and reactive to light. No  scleral icterus.  Neck: Normal range of motion. Neck supple. Carotid bruit is not present. No thyromegaly present.  Cardiovascular: Normal rate, regular rhythm and intact distal pulses.  Murmur (faint) heard. Pulses:      Radial pulses are 2+ on the right side, and 2+ on the left side.  Pulmonary/Chest: Effort normal and breath sounds normal. No respiratory distress. He has no wheezes. He has no rales.  Abdominal: Soft. Bowel sounds are normal. He exhibits no distension and no mass. There is no tenderness. There is no rebound and no guarding.  Musculoskeletal: Normal range of motion. He exhibits no edema.  Lymphadenopathy:    He has no cervical adenopathy.  Neurological: He is alert and oriented to person, place, and time.  CN grossly intact, station and gait intact  Skin: Skin is warm and dry. No rash noted.  Psychiatric: He has a normal mood and affect. His behavior is normal. Judgment and thought  content normal.  Nursing note and vitals reviewed.  Results for orders placed or performed in visit on 12/08/16  Lipid panel  Result Value Ref Range   Cholesterol 158 0 - 200 mg/dL   Triglycerides 157.0 (H) 0.0 - 149.0 mg/dL   HDL 37.30 (L) >39.00 mg/dL   VLDL 31.4 0.0 - 40.0 mg/dL   LDL Cholesterol 89 0 - 99 mg/dL   Total CHOL/HDL Ratio 4    NonHDL 120.31   TSH  Result Value Ref Range   TSH 1.29 0.35 - 4.50 uIU/mL  CBC with Differential/Platelet  Result Value Ref Range   WBC 8.1 4.0 - 10.5 K/uL   RBC 5.72 4.22 - 5.81 Mil/uL   Hemoglobin 17.7 (H) 13.0 - 17.0 g/dL   HCT 51.6 39.0 - 52.0 %   MCV 90.2 78.0 - 100.0 fl   MCHC 34.2 30.0 - 36.0 g/dL   RDW 13.4 11.5 - 15.5 %   Platelets 170.0 150.0 - 400.0 K/uL   Neutrophils Relative % 62.1 43.0 - 77.0 %   Lymphocytes Relative 23.8 12.0 - 46.0 %   Monocytes Relative 9.6 3.0 - 12.0 %   Eosinophils Relative 3.9 0.0 - 5.0 %   Basophils Relative 0.6 0.0 - 3.0 %   Neutro Abs 5.0 1.4 - 7.7 K/uL   Lymphs Abs 1.9 0.7 - 4.0 K/uL   Monocytes  Absolute 0.8 0.1 - 1.0 K/uL   Eosinophils Absolute 0.3 0.0 - 0.7 K/uL   Basophils Absolute 0.0 0.0 - 0.1 K/uL  Basic metabolic panel  Result Value Ref Range   Sodium 136 135 - 145 mEq/L   Potassium 4.3 3.5 - 5.1 mEq/L   Chloride 101 96 - 112 mEq/L   CO2 29 19 - 32 mEq/L   Glucose, Bld 101 (H) 70 - 99 mg/dL   BUN 22 6 - 23 mg/dL   Creatinine, Ser 1.34 0.40 - 1.50 mg/dL   Calcium 9.5 8.4 - 10.5 mg/dL   GFR 56.23 (L) >60.00 mL/min  Troponin I  Result Value Ref Range   TNIDX 0.01 0.00 - 0.06 ug/l      Assessment & Plan:   Problem List Items Addressed This Visit    Advanced care planning/counseling discussion    Advanced directive discussion - has at home. Daughter and wife are HCPOA. [place] with temporary life support but not prolonged if terminal condition. Unsure about feeding tube. Will bring me copy.       Chronic back pain    Tramadol refilled #30 per year.       Relevant Medications   traMADol (ULTRAM) 50 MG tablet   Essential (primary) hypertension    Chronic, stable. Continue current regimen.       Health maintenance examination - Primary    Preventative protocols reviewed and updated unless pt declined. Discussed healthy diet and lifestyle.       HLD (hyperlipidemia)    Tolerating atorvastatin.  The 10-year ASCVD risk score Mikey Bussing DC Brooke Bonito., et al., 2013) is: 17.8%   Values used to calculate the score:     Age: 4 years     Sex: Male     Is Non-Hispanic African American: No     Diabetic: No     Tobacco smoker: No     Systolic Blood Pressure: 161 mmHg     Is BP treated: Yes     HDL Cholesterol: 37.3 mg/dL     Total Cholesterol: 158 mg/dL       Male hypogonadism  Followed by Dr [name] urology on testosterone injections      Moderate mitral insufficiency    H/o this. Faint murmur on exam today.       Severe obesity (BMI 35.0-39.9) with comorbidity ([place])    Encouraged backing off sweetened beverages       Stage 3 severe COPD by GOLD classification [facility])     Reviewed prior spirometry with patient. He feels albuterol is very helpful. Will Rx spiriva to try for ongoing exertional dyspnea.       Relevant Medications   tiotropium (SPIRIVA HANDIHALER) 18 MCG inhalation capsule       Follow up plan: Return in about 6 months (around 01/12/2018) for follow up visit.  [name], MD

## 2017-07-14 NOTE — Assessment & Plan Note (Signed)
Chronic, stable. Continue current regimen. 

## 2017-07-14 NOTE — Assessment & Plan Note (Signed)
Preventative protocols reviewed and updated unless pt declined. Discussed healthy diet and lifestyle.  

## 2017-07-14 NOTE — Assessment & Plan Note (Signed)
Followed by Dr [name] urology on testosterone injections

## 2017-07-14 NOTE — Assessment & Plan Note (Signed)
H/o this. Faint murmur on exam today.

## 2017-07-14 NOTE — Assessment & Plan Note (Signed)
Tolerating atorvastatin.  The 10-year ASCVD risk score Mikey Bussing DC Brooke Bonito., et al., 2013) is: 17.8%   Values used to calculate the score:     Age: 71 years     Sex: Male     Is Non-Hispanic African American: No     Diabetic: No     Tobacco smoker: No     Systolic Blood Pressure: 185 mmHg     Is BP treated: Yes     HDL Cholesterol: 37.3 mg/dL     Total Cholesterol: 158 mg/dL

## 2017-07-14 NOTE — Assessment & Plan Note (Signed)
Encouraged backing off sweetened beverages

## 2017-07-14 NOTE — Assessment & Plan Note (Signed)
Advanced directive discussion - has at home. Daughter and wife are HCPOA. [place] with temporary life support but not prolonged if terminal condition. Unsure about feeding tube. Will bring me copy.

## 2017-07-14 NOTE — Assessment & Plan Note (Signed)
Reviewed prior spirometry with patient. He feels albuterol is very helpful. Will Rx spiriva to try for ongoing exertional dyspnea.

## 2017-07-14 NOTE — Assessment & Plan Note (Signed)
Tramadol refilled #30 per year.

## 2017-07-14 NOTE — Telephone Encounter (Signed)
ERROR

## 2017-07-15 ENCOUNTER — Encounter: Payer: Self-pay | Admitting: Family Medicine

## 2017-07-24 ENCOUNTER — Encounter: Payer: Self-pay | Admitting: Family Medicine

## 2017-07-24 DIAGNOSIS — N138 Other obstructive and reflux uropathy: Secondary | ICD-10-CM

## 2017-07-24 DIAGNOSIS — E291 Testicular hypofunction: Secondary | ICD-10-CM

## 2017-07-24 DIAGNOSIS — N529 Male erectile dysfunction, unspecified: Secondary | ICD-10-CM

## 2017-07-24 DIAGNOSIS — N401 Enlarged prostate with lower urinary tract symptoms: Secondary | ICD-10-CM

## 2017-07-26 DIAGNOSIS — M1990 Unspecified osteoarthritis, unspecified site: Secondary | ICD-10-CM | POA: Diagnosis not present

## 2017-07-26 DIAGNOSIS — R0602 Shortness of breath: Secondary | ICD-10-CM | POA: Diagnosis not present

## 2017-07-26 DIAGNOSIS — J45909 Unspecified asthma, uncomplicated: Secondary | ICD-10-CM | POA: Diagnosis not present

## 2017-07-26 DIAGNOSIS — G4733 Obstructive sleep apnea (adult) (pediatric): Secondary | ICD-10-CM | POA: Diagnosis not present

## 2017-07-26 DIAGNOSIS — M5136 Other intervertebral disc degeneration, lumbar region: Secondary | ICD-10-CM | POA: Diagnosis not present

## 2017-07-26 DIAGNOSIS — R011 Cardiac murmur, unspecified: Secondary | ICD-10-CM | POA: Diagnosis not present

## 2017-07-26 DIAGNOSIS — R9431 Abnormal electrocardiogram [ECG] [EKG]: Secondary | ICD-10-CM | POA: Diagnosis not present

## 2017-07-26 DIAGNOSIS — I10 Essential (primary) hypertension: Secondary | ICD-10-CM | POA: Diagnosis not present

## 2017-07-26 DIAGNOSIS — E669 Obesity, unspecified: Secondary | ICD-10-CM | POA: Diagnosis not present

## 2017-07-27 NOTE — Telephone Encounter (Signed)
urology referral placed.

## 2017-07-30 ENCOUNTER — Ambulatory Visit: Payer: Self-pay | Admitting: *Deleted

## 2017-07-30 ENCOUNTER — Encounter: Payer: Self-pay | Admitting: Family Medicine

## 2017-07-30 ENCOUNTER — Telehealth: Payer: Self-pay | Admitting: Family Medicine

## 2017-07-30 ENCOUNTER — Ambulatory Visit (INDEPENDENT_AMBULATORY_CARE_PROVIDER_SITE_OTHER): Payer: PPO | Admitting: Family Medicine

## 2017-07-30 VITALS — BP 136/74 | HR 58 | Temp 98.0°F | Ht 71.0 in | Wt 272.0 lb

## 2017-07-30 DIAGNOSIS — M7061 Trochanteric bursitis, right hip: Secondary | ICD-10-CM

## 2017-07-30 NOTE — Assessment & Plan Note (Signed)
He has significant tightness in his left hamstring which is likely leading to this biomechanical problem.  Has left achilles tendinitis which leads to an abnormal gait as well. Has some weakness with hip abduction.  - injection today  - counseled on HEP  - provided Pennsaid samples  - if no improvement would consider PT

## 2017-07-30 NOTE — Telephone Encounter (Signed)
Pt has appt to see Dr Raeford Razor today at 10:10.

## 2017-07-30 NOTE — Telephone Encounter (Signed)
PEC will call and schedule patient with Dr. Raeford Razor.

## 2017-07-30 NOTE — Telephone Encounter (Signed)
Pt complains of severe right hip pain and has past diagnosis of bursitis; he would like to come in for an injection; the pt states that Dr Lorelei Pont gave him an injection 05/20/17; he is going on vacation for 1 week and would like to be seen today; Right hip pain greater than left; pt offered and accepted an appointment with Clearance Coots at [facility] 07/30/17 at 1010; pt verbalizes understanding. Reason for Disposition . [1] MODERATE pain (e.g., interferes with normal activities, limping) AND [2] present > 3 days  Answer Assessment - Initial Assessment Questions 1. LOCATION and RADIATION: "Where is the pain located?"      Right hip 2. QUALITY: "What does the pain feel like?"  (e.g., sharp, dull, aching, burning)     Sharp, stabbing 3. SEVERITY: "How bad is the pain?" "What does it keep you from doing?"   (Scale 1-10; or mild, moderate, severe)   -  MILD (1-3): doesn't interfere with normal activities    -  MODERATE (4-7): interferes with normal activities (e.g., work or school) or awakens from sleep, limping    -  SEVERE (8-10): excruciating pain, unable to do any normal activities, unable to walk     Severe unable to sleep 4. ONSET: "When did the pain start?" "Does it come and go, or is it there all the time?"     Tuesday 07/27/16 5. WORK OR EXERCISE: "Has there been any recent work or exercise that involved this part of the body?"    Yes put up sheet rock at home 6. CAUSE: "What do you think is causing the hip pain?"      bursitis 7. AGGRAVATING FACTORS: "What makes the hip pain worse?" (e.g., walking, climbing stairs, running)     Laying down trying to sleep 8. OTHER SYMPTOMS: "Do you have any other symptoms?" (e.g., back pain, pain shooting down leg,  fever, rash)     no  Protocols used: HIP PAIN-A-AH

## 2017-07-30 NOTE — Telephone Encounter (Signed)
Aaron Mitchell is at [employer] today

## 2017-07-30 NOTE — Telephone Encounter (Signed)
Pt complains of severe right hip pain and has past diagnosis of bursitis; he would like to come in for an injection; the pt states that Dr Lorelei Pont gave him an injection 05/20/17; he is going on vacation for 1 week and would like to be seen today; will route to [facility] offices; pt can be contacted at [phone].

## 2017-07-30 NOTE — Progress Notes (Signed)
Bristol Soy - 72 y.o. male MRN [id number]  Date of birth: 25-Nov-1945  SUBJECTIVE:  Including CC & ROS.  Chief Complaint  Patient presents with  . Bilateral Hip pain    Aaron Mitchell is a 72 y.o. male that is presenting with bilateral hip pain. Located in his greater trochanteric joint. States he has bursitis in both hips. Pain is worse in the right. Had previous injections in October, which improved his pain. Pain has been increasing over the past three days. He was carrying sheet rock and supplies and noticed the pain the next day.  Difficult to sleep at night, has been using a massager and Aleve for the pain. Pain is worse when sitting or laying down. Pain is described as a stabbing pain. Patient is going on a cruise tomorrow and could not tolerate the pain.  Independent review of hip x-rays and AP of the pelvis shows normal joint spaces.   Review of Systems  Constitutional: Negative for fever.  Respiratory: Negative for shortness of breath.   Cardiovascular: Negative for chest pain.  Gastrointestinal: Negative for abdominal pain.  Musculoskeletal: Positive for back pain. Negative for gait problem.  Skin: Negative for color change.  Neurological: Negative for weakness.  Hematological: Negative for adenopathy.  Psychiatric/Behavioral: Negative for agitation.    HISTORY: Past Medical, Surgical, Social, and Family History Reviewed & Updated per EMR.   Pertinent Historical Findings include:  Past Medical History:  Diagnosis Date  . Achilles tendinitis    Left leg  . Borderline diabetes mellitus   . Childhood asthma   . Degenerative disc disease, lumbar    rec against surgery  . Erectile dysfunction   . GI bleed 12/2015   hospitalization  . Gout   . Hypertension   . Ileus ([place]) 12/2015   hospitalization   . Male hypogonadism    followed by urology on T injections ([place])  . Seasonal allergies    pollen  . Severe obesity (BMI >= 40) ([facility])   . Shoulder bursitis   .  Sleep apnea    CPAP    Past Surgical History:  Procedure Laterality Date  . ADENOIDECTOMY    . CARPAL TUNNEL RELEASE Right 2015  . CATARACT EXTRACTION W/PHACO Right 07/15/2016   Procedure: CATARACT EXTRACTION PHACO AND INTRAOCULAR LENS PLACEMENT (IOC);  Surgeon: Leandrew Koyanagi, MD;  Location: [facility];  Service: Ophthalmology;  Laterality: Right;  TORIC sleep apnea  . CATARACT EXTRACTION W/PHACO Left 08/05/2016   Procedure: CATARACT EXTRACTION PHACO AND INTRAOCULAR LENS PLACEMENT (IOC) toric;  Surgeon: Leandrew Koyanagi, MD;  Location: [facility];  Service: Ophthalmology;  Laterality: Left;  left sleep apnea toric  . COLONOSCOPY  10/2012   rpt 5 yrs ([facility])  . EYE SURGERY     Lens replacement 07/12/2016,08/05/2016  . LUMBAR EPIDURAL INJECTION Bilateral 04/2017   S1 transforaminal ESI  . RHINOPLASTY    . TONSILLECTOMY  ?1954  . TRIGGER FINGER RELEASE Right 2015   4th    Allergies  Allergen Reactions  . Codeine Other (See Comments)    Other reaction(s): Other (See Comments) HEADACHES Causes headaches when he stops liquid Codeine-based products    Family History  Problem Relation Age of Onset  . Alcohol abuse Father   . Hypertension Father   . AVM Father 64       hemorrhagic stroke  . Diabetes Paternal Grandmother   . Cancer Neg Hx   . CAD Neg Hx      Social History  Socioeconomic History  . Marital status: Married    Spouse name: Not on file  . Number of children: Not on file  . Years of education: Not on file  . Highest education level: Not on file  Social Needs  . Financial resource strain: Not on file  . Food insecurity - worry: Not on file  . Food insecurity - inability: Not on file  . Transportation needs - medical: Not on file  . Transportation needs - non-medical: Not on file  Occupational History  . Not on file  Tobacco Use  . Smoking status: Former Smoker    Last attempt to quit: 07/27/1978    Years since quitting:  39.0  . Smokeless tobacco: Never Used  . Tobacco comment: Quit in Nov 17, 1978  Substance and Sexual Activity  . Alcohol use: Yes    Alcohol/week: 3.0 oz    Types: 5 Shots of liquor per week    Comment:    . Drug use: No  . Sexual activity: Not on file  Other Topics Concern  . Not on file  Social History Narrative   Lives at home with wife [name]), dog passed away 11/17/15   1 grown child   Occ: retired Clinical biochemist, was Chief Technology Officer    Edu: 2 yrs college   Activity: no regular exercise   Diet: good water, fruits daily, splenda iced tea     PHYSICAL EXAM:  VS: BP 136/74 (BP Location: Left Arm, Patient Position: Sitting, Cuff Size: Normal)   Pulse (!) 58   Temp 98 F (36.7 C) (Oral)   Ht 5\' 11"  (1.803 m)   Wt 272 lb (123.4 kg)   SpO2 98%   BMI 37.94 kg/m  Physical Exam Gen: NAD, alert, cooperative with exam, well-appearing ENT: normal lips, normal nasal mucosa,  Eye: normal EOM, normal conjunctiva and lids CV:  no edema, +2 pedal pulses   Resp: no accessory muscle use, non-labored,  Skin: no rashes, no areas of induration  Neuro: normal tone, normal sensation to touch Psych:  normal insight, alert and oriented MSK:  Right and Left Hip:  TTP of the Gt on the right worse than left.  Normal IR and ER  Normal strength to resistance with hip flexion b/l  Significant tightness with hamstring on left.  Normal knee flexion and extension  Normal right ankle ROM. Limited left ankle ROM  Normal gait Negative SLR b/l  Mild weakness with hip abduction strength to resistance on right  Neurovascularly intact    Aspiration/Injection Procedure Note Nycere Presley 1945/12/22  Procedure: Injection Indications: right greater troch pain   Procedure Details Consent: Risks of procedure as well as the alternatives and risks of each were explained to the (patient/caregiver).  Consent for procedure obtained. Time Out: Verified patient identification, verified procedure, site/side was  marked, verified correct patient position, special equipment/implants available, medications/allergies/relevent history reviewed, required imaging and test results available.  Performed.  The area was cleaned with iodine and alcohol swabs.    The right greater trochanter was injected using 1 cc's of 40 mg Depomedrol and 4 cc's of bupivacaine 3 1/2" needle.  Ultrasound was used. Images were obtained in Long views showing the injection.   A sterile dressing was applied.  Patient did tolerate procedure well.         ASSESSMENT & PLAN:   Greater trochanteric bursitis of right hip He has significant tightness in his left hamstring which is likely leading to this biomechanical problem.  Has left  achilles tendinitis which leads to an abnormal gait as well. Has some weakness with hip abduction.  - injection today  - counseled on HEP  - provided Pennsaid samples  - if no improvement would consider PT

## 2017-07-30 NOTE — Telephone Encounter (Signed)
Spoke with pt; see nurse triage note

## 2017-07-30 NOTE — Telephone Encounter (Signed)
Would Dr. Raeford Razor want to see him?

## 2017-08-10 ENCOUNTER — Other Ambulatory Visit: Payer: Self-pay | Admitting: Family Medicine

## 2017-08-12 ENCOUNTER — Ambulatory Visit: Payer: PPO | Admitting: Urology

## 2017-08-12 ENCOUNTER — Encounter: Payer: Self-pay | Admitting: Urology

## 2017-08-12 VITALS — BP 148/84 | HR 62 | Ht 71.0 in | Wt 277.0 lb

## 2017-08-12 DIAGNOSIS — E291 Testicular hypofunction: Secondary | ICD-10-CM | POA: Diagnosis not present

## 2017-08-12 DIAGNOSIS — Z125 Encounter for screening for malignant neoplasm of prostate: Secondary | ICD-10-CM | POA: Diagnosis not present

## 2017-08-12 DIAGNOSIS — N400 Enlarged prostate without lower urinary tract symptoms: Secondary | ICD-10-CM | POA: Diagnosis not present

## 2017-08-12 DIAGNOSIS — N138 Other obstructive and reflux uropathy: Secondary | ICD-10-CM | POA: Diagnosis not present

## 2017-08-12 DIAGNOSIS — N401 Enlarged prostate with lower urinary tract symptoms: Secondary | ICD-10-CM | POA: Diagnosis not present

## 2017-08-12 LAB — URINALYSIS, COMPLETE
BILIRUBIN UA: NEGATIVE
GLUCOSE, UA: NEGATIVE
KETONES UA: NEGATIVE
Leukocytes, UA: NEGATIVE
Nitrite, UA: NEGATIVE
PROTEIN UA: NEGATIVE
RBC, UA: NEGATIVE
Specific Gravity, UA: 1.025 (ref 1.005–1.030)
UUROB: 1 mg/dL (ref 0.2–1.0)
pH, UA: 6 (ref 5.0–7.5)

## 2017-08-12 MED ORDER — TESTOSTERONE CYPIONATE 200 MG/ML IM SOLN: 100.0000 mg | INTRAMUSCULAR | 2 refills | Status: DC

## 2017-08-12 NOTE — Progress Notes (Signed)
08/12/2017 11:22 AM   Aaron Mitchell [birth year] [id number]  Referring provider: [name], MD [address] [place], [place] [address]  Chief Complaint  Patient presents with  . Benign Prostatic Hypertrophy    HPI: The patient is a 72 year old gentleman who presents today to transfer his care for hypogonadism from Dr. [name]..  1. Hypogonadism The patient been previously on testosterone cypionate 200 mg/mL injection.  He injected 0.5 mL or a total of 100 mg once per week.  His last testosterone was in August 2018 but he was a week overdue for his injections and it was 347.  Prior to that it was 912 in February 2018.  PSA was 0.88 in September 2018.  He presents today to transfer his care for insurance reasons.  His last injection was approximately 6 days ago.  He is happy with this medication.  He does note increase in his energy, decreased fatigue, and improved erections as well as libido will take this medication he is interested in pursuing treatment with this.  He has been on it for a number of years with good success.    PMH: Past Medical History:  Diagnosis Date  . Achilles tendinitis    Left leg  . Borderline diabetes mellitus   . Childhood asthma   . Degenerative disc disease, lumbar    rec against surgery  . Erectile dysfunction   . GI bleed 12/2015   hospitalization  . Gout   . Hypertension   . Ileus ([place]) 12/2015   hospitalization   . Male hypogonadism    followed by urology on T injections ([facility])  . Seasonal allergies    pollen  . Severe obesity (BMI >= 40) (H[facility])   . Shoulder bursitis   . Sleep apnea    CPAP    Surgical History: Past Surgical History:  Procedure Laterality Date  . ADENOIDECTOMY    . CARPAL TUNNEL RELEASE Right 2015  . CATARACT EXTRACTION W/PHACO Right 07/15/2016   Procedure: CATARACT EXTRACTION PHACO AND INTRAOCULAR LENS PLACEMENT (IOC);  Surgeon: Leandrew Koyanagi, MD;  Location: [facility];  Service:  Ophthalmology;  Laterality: Right;  TORIC sleep apnea  . CATARACT EXTRACTION W/PHACO Left 08/05/2016   Procedure: CATARACT EXTRACTION PHACO AND INTRAOCULAR LENS PLACEMENT (IOC) toric;  Surgeon: Leandrew Koyanagi, MD;  Location: [facility];  Service: Ophthalmology;  Laterality: Left;  left sleep apnea toric  . COLONOSCOPY  10/2012   rpt 5 yrs ([facility])  . EYE SURGERY     Lens replacement 07/12/2016,08/05/2016  . LUMBAR EPIDURAL INJECTION Bilateral 04/2017   S1 transforaminal ESI  . RHINOPLASTY    . TONSILLECTOMY  ?1954  . TRIGGER FINGER RELEASE Right 2015   4th    Home Medications:  Allergies as of 08/12/2017      Reactions   Codeine Other (See Comments)   Other reaction(s): Other (See Comments) HEADACHES Causes headaches when he stops liquid Codeine-based products      Medication List        Accurate as of 08/12/17 11:22 AM. Always use your most recent med list.          albuterol 108 (90 Base) MCG/ACT inhaler Commonly known as:  PROVENTIL HFA;VENTOLIN HFA Inhale 2 puffs into the lungs every 6 (six) hours as needed for wheezing or shortness of breath.   allopurinol 300 MG tablet Commonly known as:  ZYLOPRIM TAKE 1 TABLET(300 MG) BY MOUTH DAILY   amLODipine 5 MG tablet Commonly known as:  NORVASC Take 1 tablet (5 mg total) by mouth 2 (two) times daily.   aspirin EC 81 MG tablet Take 81 mg by mouth daily.   atorvastatin 40 MG tablet Commonly known as:  LIPITOR Take 1 tablet (40 mg total) by mouth daily.   CIALIS 5 MG tablet Generic drug:  tadalafil Take by mouth.   diclofenac 75 MG EC tablet Commonly known as:  VOLTAREN TAKE 1 TABLET BY MOUTH TWICE DAILY AS NEEDED. CAUTION: LONG-TERM USE CAN CAUSE KIDNEY PROBLEMS, RISK OF HEART ATTACK   diclofenac sodium 1 % Gel Commonly known as:  VOLTAREN Apply 2 g topically daily as needed. Apply to both hands.   fluticasone 50 MCG/ACT nasal spray Commonly known as:  FLONASE SHAKE LIQUID AND USE 2 SPRAYS  IN EACH NOSTRIL DAILY   levocetirizine 5 MG tablet Commonly known as:  XYZAL Take 1 tablet (5 mg total) by mouth every evening.   lisinopril-hydrochlorothiazide 20-12.5 MG tablet Commonly known as:  PRINZIDE,ZESTORETIC TAKE 1 TABLET BY MOUTH DAILY   metaxalone 800 MG tablet Commonly known as:  SKELAXIN Take 1 tablet (800 mg total) by mouth 3 (three) times daily as needed for muscle spasms.   Multiple Vitamins tablet Take 1 tablet by mouth daily.   naproxen sodium 220 MG tablet Commonly known as:  ALEVE Take 440 mg by mouth daily.   nitroGLYCERIN 0.2 mg/hr patch Commonly known as:  NITRODUR - Dosed in mg/24 hr USE 1/2 PATCH DAILY   testosterone cypionate 200 MG/ML injection Commonly known as:  DEPOTESTOSTERONE CYPIONATE Inject 100 mg into the muscle every 7 (seven) days. Use on Friday.   testosterone cypionate 200 MG/ML injection Commonly known as:  DEPOTESTOSTERONE CYPIONATE Inject 0.5 mLs (100 mg total) into the muscle once a week.   tiotropium 18 MCG inhalation capsule Commonly known as:  SPIRIVA HANDIHALER Place 1 capsule (18 mcg total) into inhaler and inhale daily.   traMADol 50 MG tablet Commonly known as:  ULTRAM Take 1 tablet (50 mg total) by mouth every 8 (eight) hours as needed for moderate pain.   VIAGRA 100 MG tablet Generic drug:  sildenafil Take by mouth.   zolpidem 10 MG tablet Commonly known as:  AMBIEN Take 1 tablet (10 mg total) by mouth at bedtime as needed. For sleep.       Allergies:  Allergies  Allergen Reactions  . Codeine Other (See Comments)    Other reaction(s): Other (See Comments) HEADACHES Causes headaches when he stops liquid Codeine-based products    Family History: Family History  Problem Relation Age of Onset  . Alcohol abuse Father   . Hypertension Father   . AVM Father 18       hemorrhagic stroke  . Diabetes Paternal Grandmother   . Cancer Neg Hx   . CAD Neg Hx   . Prostate cancer Neg Hx   . Bladder Cancer Neg  Hx   . Kidney cancer Neg Hx     Social History:  reports that he quit smoking about 39 years ago. he has never used smokeless tobacco. He reports that he drinks about 3.0 oz of alcohol per week. He reports that he does not use drugs.  ROS: UROLOGY Frequent Urination?: No Hard to postpone urination?: No Burning/pain with urination?: No Get up at night to urinate?: No Leakage of urine?: No Urine stream starts and stops?: No Trouble starting stream?: No Do you have to strain to urinate?: No Blood in urine?: No Urinary tract infection?: No Sexually transmitted  disease?: No Injury to kidneys or bladder?: No Painful intercourse?: No Weak stream?: No Erection problems?: No Penile pain?: No  Gastrointestinal Nausea?: No Vomiting?: No Indigestion/heartburn?: No Diarrhea?: No Constipation?: No  Constitutional Fever: No Night sweats?: No Weight loss?: No Fatigue?: No  Skin Skin rash/lesions?: No Itching?: No  Eyes Blurred vision?: No Double vision?: No  Ears/Nose/Throat Sore throat?: No Sinus problems?: No  Hematologic/Lymphatic Swollen glands?: No Easy bruising?: No  Cardiovascular Leg swelling?: No Chest pain?: No  Respiratory Cough?: No Shortness of breath?: No  Endocrine Excessive thirst?: No  Musculoskeletal Back pain?: No Joint pain?: No  Neurological Headaches?: No Dizziness?: No  Psychologic Depression?: No Anxiety?: No  Physical Exam: BP (!) 148/84 (BP Location: Right Arm, Patient Position: Sitting, Cuff Size: Large)   Pulse 62   Ht 5\' 11"  (1.803 m)   Wt 277 lb (125.6 kg)   BMI 38.63 kg/m   Constitutional:  Alert and oriented, No acute distress. HEENT: [place] AT, moist mucus membranes.  Trachea midline, no masses. Cardiovascular: No clubbing, cyanosis, or edema. Respiratory: Normal respiratory effort, no increased work of breathing. GI: Abdomen is soft, nontender, nondistended, no abdominal masses GU: No CVA tenderness.  Normal  phallus.  Testicles descended bilaterally.  DRE: 2+ smooth benign Skin: No rashes, bruises or suspicious lesions. Lymph: No cervical or inguinal adenopathy. Neurologic: Grossly intact, no focal deficits, moving all 4 extremities. Psychiatric: Normal mood and affect.  Laboratory Data: Lab Results  Component Value Date   WBC 8.1 12/08/2016   HGB 17.7 (H) 12/08/2016   HCT 51.6 12/08/2016   MCV 90.2 12/08/2016   PLT 170.0 12/08/2016    Lab Results  Component Value Date   CREATININE 1.34 12/08/2016    No results found for: PSA  No results found for: TESTOSTERONE  Lab Results  Component Value Date   HGBA1C 5.9 09/14/2016    Urinalysis No results found for: COLORURINE, APPEARANCEUR, LABSPEC, PHURINE, GLUCOSEU, HGBUR, BILIRUBINUR, KETONESUR, PROTEINUR, UROBILINOGEN, NITRITE, LEUKOCYTESUR   Assessment & Plan:    1.  Hypogonadism We will continue the patient on testosterone cypionate 200 mg/mL injecting 1/2 cc once per week.  He is due for hemoglobin/hematocrit, testosterone, PSA today.  Assuming these are normal he will follow-up in 6 months with repeat labs prior.  2.  Prostate cancer screening Due for PSA today especially given concurrent treatment with testosterone injections  Return in about 6 months (around 02/09/2018) for PSA, T, CBC prior.  Nickie Retort, MD  [facility] [address] [place], [place] [address] [phone]

## 2017-08-13 LAB — PSA: Prostate Specific Ag, Serum: 1 ng/mL (ref 0.0–4.0)

## 2017-08-13 LAB — HEMOGLOBIN AND HEMATOCRIT, BLOOD
Hematocrit: 50 % (ref 37.5–51.0)
Hemoglobin: 17.1 g/dL (ref 13.0–17.7)

## 2017-08-13 LAB — TESTOSTERONE: Testosterone: 932 ng/dL — ABNORMAL HIGH (ref 264–916)

## 2017-08-16 ENCOUNTER — Encounter: Payer: PPO | Admitting: Family Medicine

## 2017-08-16 DIAGNOSIS — E113293 Type 2 diabetes mellitus with mild nonproliferative diabetic retinopathy without macular edema, bilateral: Secondary | ICD-10-CM | POA: Diagnosis not present

## 2017-08-16 LAB — HM DIABETES EYE EXAM

## 2017-08-17 ENCOUNTER — Encounter: Payer: Self-pay | Admitting: Family Medicine

## 2017-08-17 DIAGNOSIS — E113299 Type 2 diabetes mellitus with mild nonproliferative diabetic retinopathy without macular edema, unspecified eye: Secondary | ICD-10-CM | POA: Insufficient documentation

## 2017-08-18 ENCOUNTER — Encounter: Payer: Self-pay | Admitting: Family Medicine

## 2017-08-18 ENCOUNTER — Other Ambulatory Visit: Payer: Self-pay

## 2017-08-18 ENCOUNTER — Ambulatory Visit (INDEPENDENT_AMBULATORY_CARE_PROVIDER_SITE_OTHER): Payer: PPO | Admitting: Family Medicine

## 2017-08-18 VITALS — BP 122/60 | HR 61 | Temp 98.3°F | Ht 69.0 in | Wt 278.5 lb

## 2017-08-18 DIAGNOSIS — G8929 Other chronic pain: Secondary | ICD-10-CM | POA: Diagnosis not present

## 2017-08-18 DIAGNOSIS — M7540 Impingement syndrome of unspecified shoulder: Secondary | ICD-10-CM

## 2017-08-18 DIAGNOSIS — M7550 Bursitis of unspecified shoulder: Secondary | ICD-10-CM

## 2017-08-18 DIAGNOSIS — M25512 Pain in left shoulder: Secondary | ICD-10-CM

## 2017-08-18 DIAGNOSIS — M25511 Pain in right shoulder: Secondary | ICD-10-CM | POA: Diagnosis not present

## 2017-08-18 MED ORDER — METHYLPREDNISOLONE ACETATE 40 MG/ML IJ SUSP
80.0000 mg | Freq: Once | INTRAMUSCULAR | Status: AC
  Administered 2017-08-18: 80 mg via INTRA_ARTICULAR

## 2017-08-18 NOTE — Progress Notes (Signed)
Dr. Frederico Hamman T. Margie Urbanowicz, MD, [facility] [place], [address] Phone: [phone] Fax: [phone]  08/18/2017  Patient: Aaron Mitchell, MRN: [id number], DOB: 01/07/1946, 72 y.o.  Primary Physician:  Aaron Bush, MD   Chief Complaint  Patient presents with  . Shoulder Pain    Bialteral shoulder injections   Subjective:   Rustin Mitchell is a 72 y.o. very pleasant male patient who presents with the following:  B shoulder injections: Well who I did subacromial injections on about 15 months ago who presents with bilateral shoulder pain.  He has been intermittently having shoulder pain off and on for 15 or 20 years.  He has multiple degenerative joints and pain in much of his body.  He also has some COPD.  Aside from some shoulder pain, he is generally doing well and recently got back from the [place].  B subac injection  Past Medical History, Surgical History, Social History, Family History, Problem List, Medications, and Allergies have been reviewed and updated if relevant.  Patient Active Problem List   Diagnosis Date Noted  . Background diabetic retinopathy ([place]) 08/17/2017  . Greater trochanteric bursitis of right hip 07/30/2017  . Health maintenance examination 07/14/2017  . Advanced care planning/counseling discussion 07/14/2017  . Stage 3 severe COPD by GOLD classification ([facility]) 03/31/2017  . Chest congestion 12/08/2016  . Moderate mitral insufficiency 09/14/2016  . Male hypogonadism   . Insomnia 05/12/2016  . OSA (obstructive sleep apnea) 05/12/2016  . Chronic back pain   . Degenerative disc disease, lumbar   . Left Achilles tendinitis 10/04/2015  . HLD (hyperlipidemia) 09/17/2015  . Benign prostatic hyperplasia with urinary obstruction 03/25/2015  . ED (erectile dysfunction) of organic origin 03/25/2015  . CMC arthritis, thumb, degenerative 01/19/2013  . Triggering of digit 01/19/2013  . Rotator cuff  impingement syndrome 10/20/2011  . Chronic gouty arthropathy 04/02/2011  . Severe obesity (BMI 35.0-39.9) with comorbidity ([place]) 04/02/2011  . Osteoarthritis 04/02/2011  . Allergic rhinitis, seasonal 04/02/2011  . Chronic shoulder pain 04/02/2011  . Prediabetes 03/10/2011  . Essential (primary) hypertension 03/10/2011    Past Medical History:  Diagnosis Date  . Achilles tendinitis    Left leg  . Borderline diabetes mellitus   . Childhood asthma   . Degenerative disc disease, lumbar    rec against surgery  . Erectile dysfunction   . GI bleed 12/2015   hospitalization  . Gout   . Hypertension   . Ileus ([place]) 12/2015   hospitalization   . Male hypogonadism    followed by urology on T injections ([facility])  . Seasonal allergies    pollen  . Severe obesity (BMI >= 40) (HCC)   . Shoulder bursitis   . Sleep apnea    CPAP    Past Surgical History:  Procedure Laterality Date  . ADENOIDECTOMY    . CARPAL TUNNEL RELEASE Right 2015  . CATARACT EXTRACTION W/PHACO Right 07/15/2016   Procedure: CATARACT EXTRACTION PHACO AND INTRAOCULAR LENS PLACEMENT (IOC);  Surgeon: Leandrew Koyanagi, MD;  Location: [facility];  Service: Ophthalmology;  Laterality: Right;  TORIC sleep apnea  . CATARACT EXTRACTION W/PHACO Left 08/05/2016   Procedure: CATARACT EXTRACTION PHACO AND INTRAOCULAR LENS PLACEMENT (IOC) toric;  Surgeon: Leandrew Koyanagi, MD;  Location: [facility];  Service: Ophthalmology;  Laterality: Left;  left sleep apnea toric  . COLONOSCOPY  10/2012   rpt 5 yrs ([facility])  . EYE SURGERY  Lens replacement 07/12/2016,08/05/2016  . LUMBAR EPIDURAL INJECTION Bilateral 04/2017   S1 transforaminal ESI  . RHINOPLASTY    . TONSILLECTOMY  ?1954  . TRIGGER FINGER RELEASE Right 2013-11-01   4th    Social History   Socioeconomic History  . Marital status: Married    Spouse name: Not on file  . Number of children: Not on file  . Years of education: Not on file  .  Highest education level: Not on file  Social Needs  . Financial resource strain: Not on file  . Food insecurity - worry: Not on file  . Food insecurity - inability: Not on file  . Transportation needs - medical: Not on file  . Transportation needs - non-medical: Not on file  Occupational History  . Not on file  Tobacco Use  . Smoking status: Former Smoker    Last attempt to quit: 07/27/1978    Years since quitting: 39.0  . Smokeless tobacco: Never Used  . Tobacco comment: Quit in 1978-11-02  Substance and Sexual Activity  . Alcohol use: Yes    Alcohol/week: 3.0 oz    Types: 5 Shots of liquor per week    Comment:    . Drug use: No  . Sexual activity: Not on file  Other Topics Concern  . Not on file  Social History Narrative   Lives at home with wife Aaron Mitchell), dog passed away 11/02/2015   1 grown child   Occ: retired Clinical biochemist, was Chief Technology Officer    Edu: 2 yrs college   Activity: no regular exercise   Diet: good water, fruits daily, splenda iced tea    Family History  Problem Relation Age of Onset  . Alcohol abuse Father   . Hypertension Father   . AVM Father 23       hemorrhagic stroke  . Diabetes Paternal Grandmother   . Cancer Neg Hx   . CAD Neg Hx   . Prostate cancer Neg Hx   . Bladder Cancer Neg Hx   . Kidney cancer Neg Hx     Allergies  Allergen Reactions  . Codeine Other (See Comments)    Other reaction(s): Other (See Comments) HEADACHES Causes headaches when he stops liquid Codeine-based products    Medication list reviewed and updated in full in [facility].  GEN: No fevers, chills. Nontoxic. Primarily MSK c/o today. MSK: Detailed in the HPI GI: tolerating PO intake without difficulty Neuro: No numbness, parasthesias, or tingling associated. Otherwise the pertinent positives of the ROS are noted above.   Objective:   BP 122/60   Pulse 61   Temp 98.3 F (36.8 C) (Oral)   Ht 5\' 9"  (1.753 m)   Wt 278 lb 8 oz (126.3 kg)   BMI 41.13 kg/m     GEN: Well-developed,well-nourished,in no acute distress; alert,appropriate and cooperative throughout examination HEENT: Normocephalic and atraumatic without obvious abnormalities. Ears, externally no deformities PULM: Breathing comfortably in no respiratory distress EXT: No clubbing, cyanosis, or edema PSYCH: Normally interactive. Cooperative during the interview. Pleasant. Friendly and conversant. Not anxious or depressed appearing. Normal, full affect.  Shoulder: B Inspection: No muscle wasting or winging Ecchymosis/edema: neg  AC joint, scapula, clavicle: NT Cervical spine: NT, full ROM Spurling's: neg Abduction: full, 5/5 Flexion: full, 5/5 IR, full, lift-off: 5/5 ER at neutral: full, 5/5 AC crossover: neg Neer: pos Hawkins: pos Drop Test: neg Empty Can: pos Supraspinatus insertion: mild-mod T Bicipital groove: NT Speed's: neg Yergason's: neg Sulcus sign: neg Scapular  dyskinesis: none C5-T1 intact  Neuro: Sensation intact Grip 5/5   Radiology: No results found.  Assessment and Plan:   Chronic pain of both shoulders - Plan: methylPREDNISolone acetate (DEPO-MEDROL) injection 80 mg, methylPREDNISolone acetate (DEPO-MEDROL) injection 80 mg  Subacromial bursitis  Impingement syndrome of shoulder region, unspecified laterality  Patient has minimal arthritis with some intermittent chronic impingement and bursitis.  He has historically seen multiple providers in the past including Dr. Enrigue Catena.   He had more than a years worth of relief after the last set of shoulder injections that I did for him, I think it be reasonable to repeat these.  SubAC Injection, R Verbal consent was obtained from the patient. Risks (including rare infection), benefits, and alternatives were explained. Patient prepped with Chloraprep and Ethyl Chloride used for anesthesia. The subacromial space was injected using the posterior approach. The patient tolerated the procedure well and had  decreased pain post injection. No complications. Injection: 8 cc of Lidocaine 1% and 2 mL of Depo-Medrol 40 mg. Needle: 22 gauge   SubAC Injection, L Verbal consent was obtained from the patient. Risks (including rare infection), benefits, and alternatives were explained. Patient prepped with Chloraprep and Ethyl Chloride used for anesthesia. The subacromial space was injected using the posterior approach. The patient tolerated the procedure well and had decreased pain post injection. No complications. Injection: 8 cc of Lidocaine 1% and 2 mL of Depo-Medrol 40 mg. Needle: 22 gauge   Follow-up: No Follow-up on file.  Meds ordered this encounter  Medications  . methylPREDNISolone acetate (DEPO-MEDROL) injection 80 mg  . methylPREDNISolone acetate (DEPO-MEDROL) injection 80 mg   Signed,  Ethelle Ola T. Garion Wempe, MD   Allergies as of 08/18/2017      Reactions   Codeine Other (See Comments)   Other reaction(s): Other (See Comments) HEADACHES Causes headaches when he stops liquid Codeine-based products      Medication List        Accurate as of 08/18/17  1:44 PM. Always use your most recent med list.          albuterol 108 (90 Base) MCG/ACT inhaler Commonly known as:  PROVENTIL HFA;VENTOLIN HFA Inhale 2 puffs into the lungs every 6 (six) hours as needed for wheezing or shortness of breath.   allopurinol 300 MG tablet Commonly known as:  ZYLOPRIM TAKE 1 TABLET(300 MG) BY MOUTH DAILY   amLODipine 5 MG tablet Commonly known as:  NORVASC Take 1 tablet (5 mg total) by mouth 2 (two) times daily.   aspirin EC 81 MG tablet Take 81 mg by mouth daily.   atorvastatin 40 MG tablet Commonly known as:  LIPITOR Take 1 tablet (40 mg total) by mouth daily.   CIALIS 5 MG tablet Generic drug:  tadalafil Take by mouth.   diclofenac 75 MG EC tablet Commonly known as:  VOLTAREN TAKE 1 TABLET BY MOUTH TWICE DAILY AS NEEDED. CAUTION: LONG-TERM USE CAN CAUSE KIDNEY PROBLEMS, RISK OF HEART  ATTACK   diclofenac sodium 1 % Gel Commonly known as:  VOLTAREN Apply 2 g topically daily as needed. Apply to both hands.   fluticasone 50 MCG/ACT nasal spray Commonly known as:  FLONASE SHAKE LIQUID AND USE 2 SPRAYS IN EACH NOSTRIL DAILY   levocetirizine 5 MG tablet Commonly known as:  XYZAL Take 1 tablet (5 mg total) by mouth every evening.   lisinopril-hydrochlorothiazide 20-12.5 MG tablet Commonly known as:  PRINZIDE,ZESTORETIC TAKE 1 TABLET BY MOUTH DAILY   metaxalone 800 MG tablet Commonly known as:  SKELAXIN Take 1 tablet (800 mg total) by mouth 3 (three) times daily as needed for muscle spasms.   Multiple Vitamins tablet Take 1 tablet by mouth daily.   naproxen sodium 220 MG tablet Commonly known as:  ALEVE Take 440 mg by mouth daily.   nitroGLYCERIN 0.2 mg/hr patch Commonly known as:  NITRODUR - Dosed in mg/24 hr USE 1/2 PATCH DAILY   testosterone cypionate 200 MG/ML injection Commonly known as:  DEPOTESTOSTERONE CYPIONATE Inject 100 mg into the muscle every 7 (seven) days. Use on Friday.   testosterone cypionate 200 MG/ML injection Commonly known as:  DEPOTESTOSTERONE CYPIONATE Inject 0.5 mLs (100 mg total) into the muscle once a week.   tiotropium 18 MCG inhalation capsule Commonly known as:  SPIRIVA HANDIHALER Place 1 capsule (18 mcg total) into inhaler and inhale daily.   traMADol 50 MG tablet Commonly known as:  ULTRAM Take 1 tablet (50 mg total) by mouth every 8 (eight) hours as needed for moderate pain.   VIAGRA 100 MG tablet Generic drug:  sildenafil Take by mouth.   zolpidem 10 MG tablet Commonly known as:  AMBIEN Take 1 tablet (10 mg total) by mouth at bedtime as needed. For sleep.

## 2017-08-23 ENCOUNTER — Ambulatory Visit (INDEPENDENT_AMBULATORY_CARE_PROVIDER_SITE_OTHER): Payer: PPO | Admitting: Family Medicine

## 2017-08-23 ENCOUNTER — Encounter: Payer: Self-pay | Admitting: Family Medicine

## 2017-08-23 VITALS — BP 136/80 | HR 67 | Temp 98.2°F | Wt 278.0 lb

## 2017-08-23 DIAGNOSIS — J22 Unspecified acute lower respiratory infection: Secondary | ICD-10-CM | POA: Diagnosis not present

## 2017-08-23 DIAGNOSIS — J449 Chronic obstructive pulmonary disease, unspecified: Secondary | ICD-10-CM | POA: Diagnosis not present

## 2017-08-23 DIAGNOSIS — G4733 Obstructive sleep apnea (adult) (pediatric): Secondary | ICD-10-CM | POA: Diagnosis not present

## 2017-08-23 MED ORDER — HYDROCODONE-HOMATROPINE 5-1.5 MG/5ML PO SYRP: 5.0000 mL | ORAL_SOLUTION | Freq: Two times a day (BID) | ORAL | 0 refills | Status: DC | PRN

## 2017-08-23 MED ORDER — PREDNISONE 20 MG PO TABS: ORAL_TABLET | ORAL | 0 refills | Status: DC

## 2017-08-23 MED ORDER — AZITHROMYCIN 250 MG PO TABS: ORAL_TABLET | ORAL | 0 refills | Status: DC

## 2017-08-23 NOTE — Progress Notes (Addendum)
BP 136/80 (BP Location: Left Arm, Patient Position: Sitting, Cuff Size: Large)   Pulse 67   Temp 98.2 F (36.8 C) (Oral)   Wt 278 lb (126.1 kg)   SpO2 96%   BMI 41.05 kg/m    CC: cough, congestion Subjective:    Patient ID: Aaron Mitchell, male    DOB: February 18, 1946, 72 y.o.   MRN: [id number]  HPI: Aaron Mitchell is a 72 y.o. male presenting on 08/23/2017 for Cough (Productive, yellowish- white. Started 08/13/17. Took some old Tessalon perles rx, no help) and Nasal Congestion (Also, nasal drainage. Tried OTC Daytime/Nighttime cold meds. )   Had bilateral shoulder injections last week. Dr Lorelei Pont note reviewed.  4d h/o nasal congestion "feel like I've been hit by truck". Productive cough, clear mucous from nose. ST and body aches from coughing. Some dyspnea and wheeze. Cough affecting ability to sleep. Chest > head congestion.  No fevers/chills, ear or tooth pain.  No sick contacts at home. Treating with OTC cold remedies.  Childhood asthma.  Ex smoker (1980s).  Relevant past medical, surgical, family and social history reviewed and updated as indicated. Interim medical history since our last visit reviewed. Allergies and medications reviewed and updated. Outpatient Medications Prior to Visit  Medication Sig Dispense Refill  . albuterol (PROVENTIL HFA;VENTOLIN HFA) 108 (90 Base) MCG/ACT inhaler Inhale 2 puffs into the lungs every 6 (six) hours as needed for wheezing or shortness of breath. 3 Inhaler 3  . allopurinol (ZYLOPRIM) 300 MG tablet TAKE 1 TABLET(300 MG) BY MOUTH DAILY 90 tablet 3  . amLODipine (NORVASC) 5 MG tablet Take 1 tablet (5 mg total) by mouth 2 (two) times daily. 180 tablet 3  . aspirin EC 81 MG tablet Take 81 mg by mouth daily.    . diclofenac (VOLTAREN) 75 MG EC tablet TAKE 1 TABLET BY MOUTH TWICE DAILY AS NEEDED. CAUTION: LONG-TERM USE CAN CAUSE KIDNEY PROBLEMS, RISK OF HEART ATTACK 60 tablet 0  . diclofenac sodium (VOLTAREN) 1 % GEL Apply 2 g topically daily as  needed. Apply to both hands.    . fluticasone (FLONASE) 50 MCG/ACT nasal spray SHAKE LIQUID AND USE 2 SPRAYS IN EACH NOSTRIL DAILY 16 g 6  . levocetirizine (XYZAL) 5 MG tablet Take 1 tablet (5 mg total) by mouth every evening. 90 tablet 3  . lisinopril-hydrochlorothiazide (PRINZIDE,ZESTORETIC) 20-12.5 MG tablet TAKE 1 TABLET BY MOUTH DAILY 90 tablet 3  . metaxalone (SKELAXIN) 800 MG tablet Take 1 tablet (800 mg total) by mouth 3 (three) times daily as needed for muscle spasms. 270 tablet 0  . Multiple Vitamins tablet Take 1 tablet by mouth daily.    . naproxen sodium (ANAPROX) 220 MG tablet Take 440 mg by mouth daily.     . nitroGLYCERIN (NITRODUR - DOSED IN MG/24 HR) 0.2 mg/hr patch USE 1/2 PATCH DAILY 30 patch 6  . sildenafil (VIAGRA) 100 MG tablet Take by mouth.    . tadalafil (CIALIS) 5 MG tablet Take by mouth.    . testosterone cypionate (DEPOTESTOSTERONE CYPIONATE) 200 MG/ML injection Inject 0.5 mLs (100 mg total) into the muscle once a week. 10 mL 2  . tiotropium (SPIRIVA HANDIHALER) 18 MCG inhalation capsule Place 1 capsule (18 mcg total) into inhaler and inhale daily. 30 capsule 6  . traMADol (ULTRAM) 50 MG tablet Take 1 tablet (50 mg total) by mouth every 8 (eight) hours as needed for moderate pain. 30 tablet 0  . zolpidem (AMBIEN) 10 MG tablet Take 1 tablet (10 mg  total) by mouth at bedtime as needed. For sleep. 30 tablet 0  . atorvastatin (LIPITOR) 40 MG tablet Take 1 tablet (40 mg total) by mouth daily. 90 tablet 0  . testosterone cypionate (DEPOTESTOSTERONE CYPIONATE) 200 MG/ML injection Inject 100 mg into the muscle every 7 (seven) days. Use on Friday.     No facility-administered medications prior to visit.      Per HPI unless specifically indicated in ROS section below Review of Systems     Objective:    BP 136/80 (BP Location: Left Arm, Patient Position: Sitting, Cuff Size: Large)   Pulse 67   Temp 98.2 F (36.8 C) (Oral)   Wt 278 lb (126.1 kg)   SpO2 96%   BMI 41.05  kg/m   Wt Readings from Last 3 Encounters:  10/01/17 277 lb (125.6 kg)  09/08/17 272 lb 12 oz (123.7 kg)  08/23/17 278 lb (126.1 kg)    Physical Exam  Constitutional: He appears well-developed and well-nourished. No distress.  HENT:  Head: Normocephalic and atraumatic.  Right Ear: Hearing, tympanic membrane, external ear and ear canal normal.  Left Ear: Hearing, tympanic membrane, external ear and ear canal normal.  Nose: Mucosal edema (nasal mucosal edema) and rhinorrhea present. Right sinus exhibits no maxillary sinus tenderness and no frontal sinus tenderness. Left sinus exhibits no maxillary sinus tenderness and no frontal sinus tenderness.  Mouth/Throat: Uvula is midline, oropharynx is clear and moist and mucous membranes are normal. No oropharyngeal exudate, posterior oropharyngeal edema, posterior oropharyngeal erythema or tonsillar abscesses.  Eyes: Conjunctivae and EOM are normal. Pupils are equal, round, and reactive to light. No scleral icterus.  Neck: Normal range of motion. Neck supple.  Cardiovascular: Normal rate, regular rhythm, normal heart sounds and intact distal pulses.  No murmur heard. Pulmonary/Chest: Effort normal. No respiratory distress. He has decreased breath sounds. He has no wheezes. He has rhonchi. He has no rales.  Coarse breath sounds with crackles bibasilarly  Lymphadenopathy:    He has no cervical adenopathy.  Skin: Skin is warm and dry. No rash noted.  Nursing note and vitals reviewed.  Results for orders placed or performed in visit on 08/18/17  HM DIABETES EYE EXAM  Result Value Ref Range   HM Diabetic Eye Exam Retinopathy (A) No Retinopathy      Assessment & Plan:   Problem List Items Addressed This Visit    RESOLVED: Acute respiratory infection - Primary    Not consistent with flu or CAP - no fever, overall well appearing.  Anticipate bronchitis with comorbidities (COPD, OSA) - will Rx prednisone and hycodan cough syrup, with WASP for zpack  with indications when to fill. Pt agrees with plan.  Reviewed hyperglycemia precautions with prednisone use.       Relevant Medications   azithromycin (ZITHROMAX) 250 MG tablet   OSA (obstructive sleep apnea)    Continues CPAP therapy, doing well.       Stage 3 severe COPD by GOLD classification (HCC)   Relevant Medications   azithromycin (ZITHROMAX) 250 MG tablet   predniSONE (DELTASONE) 20 MG tablet   HYDROcodone-homatropine (HYCODAN) 5-1.5 MG/5ML syrup       Follow up plan: Return if symptoms worsen or fail to improve.  [name], MD

## 2017-08-23 NOTE — Patient Instructions (Signed)
I do think you have bronchitis - treat with hycodan cough syrup, prednisone course, and I will print out antibiotic prescription - fill if worsening, or fever >101.  Push fluids and rest May take plain mucinex (guaifenesin) with large glass of water to mobilize mucous.  Let us know if not improving with treatment.

## 2017-08-23 NOTE — Assessment & Plan Note (Addendum)
Not consistent with flu or CAP - no fever, overall well appearing.  Anticipate bronchitis with comorbidities (COPD, OSA) - will Rx prednisone and hycodan cough syrup, with WASP for zpack with indications when to fill. Pt agrees with plan.  Reviewed hyperglycemia precautions with prednisone use.

## 2017-08-25 ENCOUNTER — Ambulatory Visit: Payer: Self-pay | Admitting: Family Medicine

## 2017-08-26 ENCOUNTER — Telehealth: Payer: Self-pay

## 2017-08-26 NOTE — Telephone Encounter (Signed)
Nickie Retort, MD  Lestine Box, LPN        Please let patient know labs look good. F/u as scheduled    Letter sent.

## 2017-08-30 ENCOUNTER — Encounter: Payer: Self-pay | Admitting: Family Medicine

## 2017-08-30 ENCOUNTER — Telehealth: Payer: Self-pay | Admitting: Family Medicine

## 2017-08-30 NOTE — Telephone Encounter (Signed)
Spoke with pt.  [See TE, 08/30/17.]

## 2017-08-30 NOTE — Telephone Encounter (Signed)
Pt. called to report he sent MyChart message this morning to Dr. [name] that he is not feeling any better, and now c/o "low grade sore throat."  Stated "I have all the same symptoms that I had last week in the office."  Reported he has not been sleeping well.  Stated he has productive cough, but feels "the chest congestion is a little better."  Denies any increased  shortness of breath.  Denies any fever at this time.  Reported he continues to take Mucinex, increased oral  fluids and rest.  Stated nasal drainage is clear.  Denied any headache or sinus pressure, when questioned.   Does c/o that he feels very tired, run-down, and achy.  Stated "I feel like I've been beat with a baseball bat." Asking what the next step is in his care.     Care advice given for continuing to push fluids, do warm salt water gargles, continue taking Mucinex for chest congestion, and to rest.  Advised will send message to Dr. [name].  Pt. Verb. Understanding and agreed.

## 2017-08-30 NOTE — Telephone Encounter (Signed)
Just spoke with pt and apologized for not calling him earlier in the day.  He repeated the previous message but added that he does not think another OV would do any good.  Says he feels that he is not progressing how he thinks he should.

## 2017-08-31 NOTE — Telephone Encounter (Addendum)
We treated him last week with prednisone and zpack ensure took both medications. He should also be using hycodan for cough at night time.  zpack antibiotic is long acting abx and stays in system with effect for 2 weeks - so likely still has effect.  As long as no fever >101, or worsening congestion/productive cough, recommend give this a few more days, likely just slow to recover.  Continue mucinex, may take ibuprofen 400mg  with meals for body aches for next 3-4 days.  Update [place] in 4-5 days.

## 2017-09-01 NOTE — Telephone Encounter (Signed)
Spoke with pt. Says Dr. Darnell Level sent this info in MyChart message but expressed his thanks for the call. Says he is feeling better.

## 2017-09-08 ENCOUNTER — Ambulatory Visit (INDEPENDENT_AMBULATORY_CARE_PROVIDER_SITE_OTHER): Payer: PPO | Admitting: Family Medicine

## 2017-09-08 ENCOUNTER — Other Ambulatory Visit: Payer: Self-pay

## 2017-09-08 ENCOUNTER — Encounter: Payer: Self-pay | Admitting: Family Medicine

## 2017-09-08 VITALS — BP 110/76 | HR 67 | Temp 98.8°F | Ht 69.0 in | Wt 272.8 lb

## 2017-09-08 DIAGNOSIS — M7062 Trochanteric bursitis, left hip: Secondary | ICD-10-CM

## 2017-09-08 MED ORDER — METHYLPREDNISOLONE ACETATE 40 MG/ML IJ SUSP
80.0000 mg | Freq: Once | INTRAMUSCULAR | Status: AC
  Administered 2017-09-08: 80 mg via INTRA_ARTICULAR

## 2017-09-08 NOTE — Progress Notes (Signed)
Dr. Frederico Hamman T. Verbena Boeding, MD, [facility] [place], [address] Phone: [phone] Fax: [phone]  09/08/2017  Patient: Aaron Mitchell, MRN: [id number], DOB: [birth year], 72 y.o.  Primary Physician:  [name], MD   Chief Complaint  Patient presents with  . Hip Pain    Left-Injection   Subjective:   Aaron Mitchell is a 72 y.o. very pleasant male patient who presents with the following:  L October: Pleasant gentleman who I recall quite well presents with lateral pain, recurrent at his trochanteric bursa on the left.  Last injected this in October 2018, and he had good relief of his symptoms.  He presents now doing fairly well, but with some overall wear and tear issues at almost 72 years old.  L GTB injection  5 cc lidocaine for anesthesia.   Past Medical History, Surgical History, Social History, Family History, Problem List, Medications, and Allergies have been reviewed and updated if relevant.  Patient Active Problem List   Diagnosis Date Noted  . Acute respiratory infection 08/23/2017  . Background diabetic retinopathy ([facility]) 08/17/2017  . Greater trochanteric bursitis of right hip 07/30/2017  . Health maintenance examination 07/14/2017  . Advanced care planning/counseling discussion 07/14/2017  . Stage 3 severe COPD by GOLD classification ([facility]) 03/31/2017  . Chest congestion 12/08/2016  . Moderate mitral insufficiency 09/14/2016  . Male hypogonadism   . Insomnia 05/12/2016  . OSA (obstructive sleep apnea) 05/12/2016  . Chronic back pain   . Degenerative disc disease, lumbar   . Left Achilles tendinitis 10/04/2015  . HLD (hyperlipidemia) 09/17/2015  . ED (erectile dysfunction) of organic origin 03/25/2015  . CMC arthritis, thumb, degenerative 01/19/2013  . Triggering of digit 01/19/2013  . Rotator cuff impingement syndrome 10/20/2011  . Chronic gouty arthropathy 04/02/2011  . Severe obesity (BMI  35.0-39.9) with comorbidity ([facility]) 04/02/2011  . Osteoarthritis 04/02/2011  . Allergic rhinitis, seasonal 04/02/2011  . Chronic shoulder pain 04/02/2011  . Prediabetes 03/10/2011  . Essential (primary) hypertension 03/10/2011    Past Medical History:  Diagnosis Date  . Achilles tendinitis    Left leg  . Borderline diabetes mellitus   . Childhood asthma   . Degenerative disc disease, lumbar    rec against surgery  . Erectile dysfunction   . GI bleed 12/2015   hospitalization  . Gout   . Hypertension   . Ileus ([facility]) 12/2015   hospitalization   . Male hypogonadism    followed by urology on T injections ([facility])  . Seasonal allergies    pollen  . Severe obesity (BMI >= 40) (HCC)   . Shoulder bursitis   . Sleep apnea    CPAP    Past Surgical History:  Procedure Laterality Date  . ADENOIDECTOMY    . CARPAL TUNNEL RELEASE Right 2015  . CATARACT EXTRACTION W/PHACO Right 07/15/2016   Procedure: CATARACT EXTRACTION PHACO AND INTRAOCULAR LENS PLACEMENT (IOC);  Surgeon: Leandrew Koyanagi, MD;  Location: [facility];  Service: Ophthalmology;  Laterality: Right;  TORIC sleep apnea  . CATARACT EXTRACTION W/PHACO Left 08/05/2016   Procedure: CATARACT EXTRACTION PHACO AND INTRAOCULAR LENS PLACEMENT (IOC) toric;  Surgeon: Leandrew Koyanagi, MD;  Location: [facility];  Service: Ophthalmology;  Laterality: Left;  left sleep apnea toric  . COLONOSCOPY  10/2012   rpt 5 yrs ([facility])  . EYE SURGERY     Lens replacement 07/12/2016,08/05/2016  . LUMBAR EPIDURAL INJECTION Bilateral 04/2017   S1 transforaminal ESI  .  RHINOPLASTY    . TONSILLECTOMY  ?1954  . TRIGGER FINGER RELEASE Right 21-Nov-2013   4th    Social History   Socioeconomic History  . Marital status: Married    Spouse name: Not on file  . Number of children: Not on file  . Years of education: Not on file  . Highest education level: Not on file  Social Needs  . Financial resource strain: Not on file  .  Food insecurity - worry: Not on file  . Food insecurity - inability: Not on file  . Transportation needs - medical: Not on file  . Transportation needs - non-medical: Not on file  Occupational History  . Not on file  Tobacco Use  . Smoking status: Former Smoker    Last attempt to quit: 07/27/1978    Years since quitting: 39.1  . Smokeless tobacco: Never Used  . Tobacco comment: Quit in 11-22-78  Substance and Sexual Activity  . Alcohol use: Yes    Alcohol/week: 3.0 oz    Types: 5 Shots of liquor per week    Comment:    . Drug use: No  . Sexual activity: Not on file  Other Topics Concern  . Not on file  Social History Narrative   Lives at home with wife [name]), dog passed away 2015-11-22   1 grown child   Occ: retired Clinical biochemist, was Chief Technology Officer    Edu: 2 yrs college   Activity: no regular exercise   Diet: good water, fruits daily, splenda iced tea    Family History  Problem Relation Age of Onset  . Alcohol abuse Father   . Hypertension Father   . AVM Father 76       hemorrhagic stroke  . Diabetes Paternal Grandmother   . Cancer Neg Hx   . CAD Neg Hx   . Prostate cancer Neg Hx   . Bladder Cancer Neg Hx   . Kidney cancer Neg Hx     Allergies  Allergen Reactions  . Codeine Other (See Comments)    Other reaction(s): Other (See Comments) HEADACHES Causes headaches when he stops liquid Codeine-based products    Medication list reviewed and updated in full in [facility].  GEN: No fevers, chills. Nontoxic. Primarily MSK c/o today. MSK: Detailed in the HPI GI: tolerating PO intake without difficulty Neuro: No numbness, parasthesias, or tingling associated. Otherwise the pertinent positives of the ROS are noted above.   Objective:   BP 110/76   Pulse 67   Temp 98.8 F (37.1 C) (Oral)   Ht 5\' 9"  (1.753 m)   Wt 272 lb 12 oz (123.7 kg)   BMI 40.28 kg/m    GEN: WDWN, NAD, Non-toxic, Alert & Oriented x 3 HEENT: Atraumatic, Normocephalic.  Ears and Nose:  No external deformity. EXTR: No clubbing/cyanosis/edema NEURO: Normal gait.  PSYCH: Normally interactive. Conversant. Not depressed or anxious appearing.  Calm demeanor.   HIP EXAM: SIDE: L ROM: Abduction, Flexion, Internal and External range of motion: full Pain with terminal IROM and EROM: minor at terminal EROM GTB: + SLR: NEG Knees: No effusion FABER: NT REVERSE FABER: NT, neg Piriformis: NT at direct palpation Str: flexion: 5/5 abduction: 5/5 adduction: 5/5 Strength testing non-tender  Radiology: No results found.  Assessment and Plan:   Trochanteric bursitis of left hip - Plan: methylPREDNISolone acetate (DEPO-MEDROL) injection 80 mg  He is on reasonable, but he has had various wear and tear issues throughout most of his body in terms  of arthritis as well as some problems with his back.  Recommended weight loss.  Prior to his trochanteric bursa injection, I used 5 cc of 1% lidocaine as an anesthetic at his request with a 2-1/2 inch needle with good success and good anesthesia.  Trochanteric Bursitis Injection, L Verbal consent obtained. Risks (including infection, potential atrophy), benefits, and alternatives reviewed. Greater trochanter sterilely prepped with Chloraprep. Ethyl Chloride used for anesthesia. 8 cc of Lidocaine 1% injected with 2 mL of Depo-Medrol 40 mg into trochanteric bursa at area of maximal tenderness at greater trochanter. Needle taken to bone to troch bursa, flows easily. Bursa massaged. No bleeding and no complications. Decreased pain after injection. Needle: 22 gauge spinal needle   Follow-up: No Follow-up on file.  Meds ordered this encounter  Medications  . methylPREDNISolone acetate (DEPO-MEDROL) injection 80 mg   Signed,  Veryl Abril T. Valentine Kuechle, MD   Allergies as of 09/08/2017      Reactions   Codeine Other (See Comments)   Other reaction(s): Other (See Comments) HEADACHES Causes headaches when he stops liquid Codeine-based products       Medication List        Accurate as of 09/08/17  5:55 PM. Always use your most recent med list.          albuterol 108 (90 Base) MCG/ACT inhaler Commonly known as:  PROVENTIL HFA;VENTOLIN HFA Inhale 2 puffs into the lungs every 6 (six) hours as needed for wheezing or shortness of breath.   allopurinol 300 MG tablet Commonly known as:  ZYLOPRIM TAKE 1 TABLET(300 MG) BY MOUTH DAILY   amLODipine 5 MG tablet Commonly known as:  NORVASC Take 1 tablet (5 mg total) by mouth 2 (two) times daily.   aspirin EC 81 MG tablet Take 81 mg by mouth daily.   atorvastatin 40 MG tablet Commonly known as:  LIPITOR Take 1 tablet (40 mg total) by mouth daily.   azithromycin 250 MG tablet Commonly known as:  ZITHROMAX Take two tablets on day one followed by one tablet on days 2-5   CIALIS 5 MG tablet Generic drug:  tadalafil Take by mouth.   diclofenac 75 MG EC tablet Commonly known as:  VOLTAREN TAKE 1 TABLET BY MOUTH TWICE DAILY AS NEEDED. CAUTION: LONG-TERM USE CAN CAUSE KIDNEY PROBLEMS, RISK OF HEART ATTACK   diclofenac sodium 1 % Gel Commonly known as:  VOLTAREN Apply 2 g topically daily as needed. Apply to both hands.   fluticasone 50 MCG/ACT nasal spray Commonly known as:  FLONASE SHAKE LIQUID AND USE 2 SPRAYS IN EACH NOSTRIL DAILY   HYDROcodone-homatropine 5-1.5 MG/5ML syrup Commonly known as:  HYCODAN Take 5 mLs by mouth 2 (two) times daily as needed for cough (sedation precautions).   levocetirizine 5 MG tablet Commonly known as:  XYZAL Take 1 tablet (5 mg total) by mouth every evening.   lisinopril-hydrochlorothiazide 20-12.5 MG tablet Commonly known as:  PRINZIDE,ZESTORETIC TAKE 1 TABLET BY MOUTH DAILY   metaxalone 800 MG tablet Commonly known as:  SKELAXIN Take 1 tablet (800 mg total) by mouth 3 (three) times daily as needed for muscle spasms.   Multiple Vitamins tablet Take 1 tablet by mouth daily.   naproxen sodium 220 MG tablet Commonly known as:   ALEVE Take 440 mg by mouth daily.   nitroGLYCERIN 0.2 mg/hr patch Commonly known as:  NITRODUR - Dosed in mg/24 hr USE 1/2 PATCH DAILY   predniSONE 20 MG tablet Commonly known as:  DELTASONE Take two tablets daily  for 4 days followed by one tablet daily for 4 days   testosterone cypionate 200 MG/ML injection Commonly known as:  DEPOTESTOSTERONE CYPIONATE Inject 100 mg into the muscle every 7 (seven) days. Use on Friday.   testosterone cypionate 200 MG/ML injection Commonly known as:  DEPOTESTOSTERONE CYPIONATE Inject 0.5 mLs (100 mg total) into the muscle once a week.   tiotropium 18 MCG inhalation capsule Commonly known as:  SPIRIVA HANDIHALER Place 1 capsule (18 mcg total) into inhaler and inhale daily.   traMADol 50 MG tablet Commonly known as:  ULTRAM Take 1 tablet (50 mg total) by mouth every 8 (eight) hours as needed for moderate pain.   VIAGRA 100 MG tablet Generic drug:  sildenafil Take by mouth.   zolpidem 10 MG tablet Commonly known as:  AMBIEN Take 1 tablet (10 mg total) by mouth at bedtime as needed. For sleep.

## 2017-09-10 ENCOUNTER — Encounter: Payer: Self-pay | Admitting: Family Medicine

## 2017-09-11 NOTE — Telephone Encounter (Signed)
Letter written and in chart. I've asked him to let us know where to send this or alternatively he can come pick up letter.

## 2017-09-16 NOTE — Telephone Encounter (Signed)
See pt email - plz fax letter to: [facility] [place] [address] [phone] FAX The previous approvals should be in my record and they may show a different address. It should probably also go to  [facility] [place]. [place] [address] [phone] FAX  Can you call first and ensure we're sending to the correct place? thanks

## 2017-09-17 NOTE — Telephone Encounter (Signed)
Letter was faxed to [facility] and [facility].  Pt is aware.

## 2017-09-21 ENCOUNTER — Encounter: Payer: Self-pay | Admitting: Family Medicine

## 2017-09-21 DIAGNOSIS — R234 Changes in skin texture: Secondary | ICD-10-CM

## 2017-09-21 DIAGNOSIS — L739 Follicular disorder, unspecified: Secondary | ICD-10-CM

## 2017-09-22 DIAGNOSIS — G4733 Obstructive sleep apnea (adult) (pediatric): Secondary | ICD-10-CM | POA: Diagnosis not present

## 2017-09-22 NOTE — Telephone Encounter (Signed)
Pt received a phone call and was unsure if it was regarding this Mychart message. Please advise.

## 2017-09-24 NOTE — Telephone Encounter (Signed)
Pt called and is confused on why he hasnt heard a response from his pcp or pcp's nurse for the question he presented below, contact pt to advise

## 2017-09-27 NOTE — Telephone Encounter (Signed)
referral placed today. I've been in touch with him over mychart.

## 2017-09-27 NOTE — Addendum Note (Signed)
Addended by: [name] on: 09/27/2017 12:03 PM   Modules accepted: Orders

## 2017-09-28 ENCOUNTER — Telehealth: Payer: Self-pay

## 2017-09-28 NOTE — Telephone Encounter (Signed)
Filled and in Lisa's box 

## 2017-09-28 NOTE — Telephone Encounter (Signed)
Received faxed Physician's Rx & Cert of Medical Necessity from [facility] to be signed and faxed.   Placed form in Dr. Synthia Innocent box.

## 2017-09-29 ENCOUNTER — Encounter: Payer: Self-pay | Admitting: Family Medicine

## 2017-09-29 NOTE — Telephone Encounter (Signed)
Faxed form.

## 2017-09-30 ENCOUNTER — Encounter: Payer: Self-pay | Admitting: Family Medicine

## 2017-10-01 ENCOUNTER — Encounter: Payer: Self-pay | Admitting: Family Medicine

## 2017-10-01 ENCOUNTER — Ambulatory Visit (INDEPENDENT_AMBULATORY_CARE_PROVIDER_SITE_OTHER): Payer: PPO | Admitting: Family Medicine

## 2017-10-01 VITALS — BP 124/68 | HR 67 | Temp 97.9°F | Wt 277.0 lb

## 2017-10-01 DIAGNOSIS — R234 Changes in skin texture: Secondary | ICD-10-CM | POA: Diagnosis not present

## 2017-10-01 MED ORDER — CEPHALEXIN 500 MG PO CAPS: 500.0000 mg | ORAL_CAPSULE | Freq: Three times a day (TID) | ORAL | 0 refills | Status: DC

## 2017-10-01 NOTE — Patient Instructions (Signed)
Possible skin infection - although no typical redness seen. Hold testosterone shots until fully resolved.  Try new syringes.  If recurrent, return to see Dr Pilar Jarvis.

## 2017-10-01 NOTE — Progress Notes (Signed)
BP 124/68 (BP Location: Left Arm, Patient Position: Sitting, Cuff Size: Large)   Pulse 67   Temp 97.9 F (36.6 C) (Oral)   Wt 277 lb (125.6 kg)   SpO2 96%   BMI 40.91 kg/m    CC: ?injection reaction Subjective:    Patient ID: Aaron Mitchell, male    DOB: [birth year], 72 y.o.   MRN: [id number]  HPI: Aaron Mitchell is a 72 y.o. male presenting on 10/01/2017 for Edema (C/o pain and swelling in left inner thigh where he receives weekly injection.)   Injects testosterone IM 100mg  weekly Pilar Jarvis). Usually alternates bilateral anterior thighs. Last 3 shots he did have residual soreness for 3-4 days. Last injection Friday last week to left inner thigh - skin at injection site swelled and tender, still some residual discomfort. No redness, mild warmth.   Uses new 26 g 1 in needle each time. He uses 3cc syringes - these were very old and may have expired. He does clean skin with alcohol pad.   Relevant past medical, surgical, family and social history reviewed and updated as indicated. Interim medical history since our last visit reviewed. Allergies and medications reviewed and updated. Outpatient Medications Prior to Visit  Medication Sig Dispense Refill  . albuterol (PROVENTIL HFA;VENTOLIN HFA) 108 (90 Base) MCG/ACT inhaler Inhale 2 puffs into the lungs every 6 (six) hours as needed for wheezing or shortness of breath. 3 Inhaler 3  . allopurinol (ZYLOPRIM) 300 MG tablet TAKE 1 TABLET(300 MG) BY MOUTH DAILY 90 tablet 3  . amLODipine (NORVASC) 5 MG tablet Take 1 tablet (5 mg total) by mouth 2 (two) times daily. 180 tablet 3  . aspirin EC 81 MG tablet Take 81 mg by mouth daily.    [name] Kitchen atorvastatin (LIPITOR) 40 MG tablet Take 1 tablet (40 mg total) by mouth daily. 90 tablet 0  . azithromycin (ZITHROMAX) 250 MG tablet Take two tablets on day one followed by one tablet on days 2-5 6 each 0  . diclofenac (VOLTAREN) 75 MG EC tablet TAKE 1 TABLET BY MOUTH TWICE DAILY AS NEEDED. CAUTION: LONG-TERM USE  Aaron CAUSE KIDNEY PROBLEMS, RISK OF HEART ATTACK 60 tablet 0  . diclofenac sodium (VOLTAREN) 1 % GEL Apply 2 g topically daily as needed. Apply to both hands.    . fluticasone (FLONASE) 50 MCG/ACT nasal spray SHAKE LIQUID AND USE 2 SPRAYS IN EACH NOSTRIL DAILY 16 g 6  . HYDROcodone-homatropine (HYCODAN) 5-1.5 MG/5ML syrup Take 5 mLs by mouth 2 (two) times daily as needed for cough (sedation precautions). 140 mL 0  . levocetirizine (XYZAL) 5 MG tablet Take 1 tablet (5 mg total) by mouth every evening. 90 tablet 3  . lisinopril-hydrochlorothiazide (PRINZIDE,ZESTORETIC) 20-12.5 MG tablet TAKE 1 TABLET BY MOUTH DAILY 90 tablet 3  . metaxalone (SKELAXIN) 800 MG tablet Take 1 tablet (800 mg total) by mouth 3 (three) times daily as needed for muscle spasms. 270 tablet 0  . Multiple Vitamins tablet Take 1 tablet by mouth daily.    . naproxen sodium (ANAPROX) 220 MG tablet Take 440 mg by mouth daily.     . nitroGLYCERIN (NITRODUR - DOSED IN MG/24 HR) 0.2 mg/hr patch USE 1/2 PATCH DAILY 30 patch 6  . predniSONE (DELTASONE) 20 MG tablet Take two tablets daily for 4 days followed by one tablet daily for 4 days 12 tablet 0  . sildenafil (VIAGRA) 100 MG tablet Take by mouth.    . tadalafil (CIALIS) 5 MG tablet Take by mouth.    [name] Kitchen  testosterone cypionate (DEPOTESTOSTERONE CYPIONATE) 200 MG/ML injection Inject 0.5 mLs (100 mg total) into the muscle once a week. 10 mL 2  . tiotropium (SPIRIVA HANDIHALER) 18 MCG inhalation capsule Place 1 capsule (18 mcg total) into inhaler and inhale daily. 30 capsule 6  . traMADol (ULTRAM) 50 MG tablet Take 1 tablet (50 mg total) by mouth every 8 (eight) hours as needed for moderate pain. 30 tablet 0  . zolpidem (AMBIEN) 10 MG tablet Take 1 tablet (10 mg total) by mouth at bedtime as needed. For sleep. 30 tablet 0  . testosterone cypionate (DEPOTESTOSTERONE CYPIONATE) 200 MG/ML injection Inject 100 mg into the muscle every 7 (seven) days. Use on Friday.     No facility-administered  medications prior to visit.      Per HPI unless specifically indicated in ROS section below Review of Systems     Objective:    BP 124/68 (BP Location: Left Arm, Patient Position: Sitting, Cuff Size: Large)   Pulse 67   Temp 97.9 F (36.6 C) (Oral)   Wt 277 lb (125.6 kg)   SpO2 96%   BMI 40.91 kg/m   Wt Readings from Last 3 Encounters:  10/01/17 277 lb (125.6 kg)  09/08/17 272 lb 12 oz (123.7 kg)  08/23/17 278 lb (126.1 kg)    Physical Exam  Constitutional: He appears well-developed and well-nourished. No distress.  Skin: Skin is warm and dry. No erythema.  Tender indurated area left inner thigh about 9.5x9.5cm induration with warmth. No erythema.  Nursing note and vitals reviewed.     Assessment & Plan:   Problem List Items Addressed This Visit    Induration of skin - Primary    Indurated, tender, warm skin/subcutaneous tissue around latest testosterone cypionate injection site - possible cellulitis vs myositis vs other drug reaction after testosterone. Advised hold testosterone until fully resolved then may try once again and if recurrent reaction/swelling to stop testosterone and f/u with urology.  He will use new syringes.  Will also cover with keflex antibiotic for possible cellulitis component.           Meds ordered this encounter  Medications  . cephALEXin (KEFLEX) 500 MG capsule    Sig: Take 1 capsule (500 mg total) by mouth 3 (three) times daily.    Dispense:  21 capsule    Refill:  0   No orders of the defined types were placed in this encounter.   Follow up plan: Return if symptoms worsen or fail to improve.  [name], MD

## 2017-10-01 NOTE — Assessment & Plan Note (Signed)
Indurated, tender, warm skin/subcutaneous tissue around latest testosterone cypionate injection site - possible cellulitis vs myositis vs other drug reaction after testosterone. Advised hold testosterone until fully resolved then may try once again and if recurrent reaction/swelling to stop testosterone and f/u with urology.  He will use new syringes.  Will also cover with keflex antibiotic for possible cellulitis component.

## 2017-10-04 ENCOUNTER — Ambulatory Visit: Payer: Self-pay | Admitting: Family Medicine

## 2017-10-15 DIAGNOSIS — D1800 Hemangioma unspecified site: Secondary | ICD-10-CM | POA: Diagnosis not present

## 2017-10-15 DIAGNOSIS — D485 Neoplasm of uncertain behavior of skin: Secondary | ICD-10-CM | POA: Diagnosis not present

## 2017-10-15 DIAGNOSIS — L821 Other seborrheic keratosis: Secondary | ICD-10-CM | POA: Diagnosis not present

## 2017-10-15 DIAGNOSIS — L738 Other specified follicular disorders: Secondary | ICD-10-CM | POA: Diagnosis not present

## 2017-10-17 ENCOUNTER — Encounter: Payer: Self-pay | Admitting: Family Medicine

## 2017-10-19 MED ORDER — ATORVASTATIN CALCIUM 40 MG PO TABS: 40.0000 mg | ORAL_TABLET | Freq: Every day | ORAL | 3 refills | Status: DC

## 2017-11-09 NOTE — Telephone Encounter (Signed)
ERROR

## 2017-11-10 ENCOUNTER — Encounter: Payer: Self-pay | Admitting: Family Medicine

## 2017-11-10 ENCOUNTER — Telehealth: Payer: Self-pay

## 2017-11-10 NOTE — Telephone Encounter (Signed)
Received Confirmation of Order from [place].    Placed in Dr. Synthia Innocent box.

## 2017-11-11 NOTE — Telephone Encounter (Signed)
Ok to give 90d supply of spiriva. See pt mychart message.

## 2017-11-15 MED ORDER — TIOTROPIUM BROMIDE MONOHYDRATE 18 MCG IN CAPS: 18.0000 ug | ORAL_CAPSULE | Freq: Every day | RESPIRATORY_TRACT | 3 refills | Status: DC

## 2017-11-15 NOTE — Telephone Encounter (Signed)
Form signed. Sleep study printed and in my out box.

## 2017-11-15 NOTE — Assessment & Plan Note (Signed)
Continues CPAP therapy, doing well.

## 2017-11-19 DIAGNOSIS — G4733 Obstructive sleep apnea (adult) (pediatric): Secondary | ICD-10-CM | POA: Diagnosis not present

## 2017-12-06 ENCOUNTER — Other Ambulatory Visit: Payer: Self-pay | Admitting: Family Medicine

## 2017-12-06 NOTE — Telephone Encounter (Signed)
Patient said that he needs 90 days on all medications.

## 2017-12-06 NOTE — Telephone Encounter (Signed)
Okay to fill others for 90 days. If up to date on visits and Dr Darnell Level generally fills for a year---okay to fill routines for a year

## 2017-12-06 NOTE — Telephone Encounter (Signed)
Last filled 07-14-17 #30 Last OV 10-01-17 Next OV 01-13-18  Forwarding to Dr Silvio Pate in Dr Synthia Innocent absence

## 2017-12-23 DIAGNOSIS — M48062 Spinal stenosis, lumbar region with neurogenic claudication: Secondary | ICD-10-CM | POA: Diagnosis not present

## 2017-12-23 DIAGNOSIS — M5136 Other intervertebral disc degeneration, lumbar region: Secondary | ICD-10-CM | POA: Diagnosis not present

## 2017-12-23 DIAGNOSIS — M5416 Radiculopathy, lumbar region: Secondary | ICD-10-CM | POA: Diagnosis not present

## 2017-12-23 DIAGNOSIS — M5126 Other intervertebral disc displacement, lumbar region: Secondary | ICD-10-CM | POA: Diagnosis not present

## 2017-12-30 ENCOUNTER — Encounter: Payer: Self-pay | Admitting: Family Medicine

## 2018-01-13 ENCOUNTER — Ambulatory Visit (INDEPENDENT_AMBULATORY_CARE_PROVIDER_SITE_OTHER): Payer: PPO | Admitting: Family Medicine

## 2018-01-13 ENCOUNTER — Encounter: Payer: Self-pay | Admitting: Family Medicine

## 2018-01-13 VITALS — BP 130/82 | HR 60 | Temp 97.9°F | Ht 69.0 in | Wt 287.2 lb

## 2018-01-13 DIAGNOSIS — R790 Abnormal level of blood mineral: Secondary | ICD-10-CM | POA: Diagnosis not present

## 2018-01-13 DIAGNOSIS — J449 Chronic obstructive pulmonary disease, unspecified: Secondary | ICD-10-CM

## 2018-01-13 DIAGNOSIS — E291 Testicular hypofunction: Secondary | ICD-10-CM

## 2018-01-13 DIAGNOSIS — R7303 Prediabetes: Secondary | ICD-10-CM

## 2018-01-13 LAB — COMPREHENSIVE METABOLIC PANEL
ALBUMIN: 4.2 g/dL (ref 3.5–5.2)
ALK PHOS: 53 U/L (ref 39–117)
ALT: 34 U/L (ref 0–53)
AST: 24 U/L (ref 0–37)
BUN: 14 mg/dL (ref 6–23)
CO2: 24 mEq/L (ref 19–32)
Calcium: 9 mg/dL (ref 8.4–10.5)
Chloride: 104 mEq/L (ref 96–112)
Creatinine, Ser: 1.02 mg/dL (ref 0.40–1.50)
GFR: 76.79 mL/min (ref >60.00)
Glucose, Bld: 140 mg/dL — ABNORMAL HIGH (ref 70–99)
POTASSIUM: 3.8 meq/L (ref 3.5–5.1)
SODIUM: 137 meq/L (ref 135–145)
TOTAL PROTEIN: 6.6 g/dL (ref 6.0–8.3)
Total Bilirubin: 0.8 mg/dL (ref 0.2–1.2)

## 2018-01-13 LAB — MAGNESIUM: Magnesium: 1.5 mg/dL (ref 1.5–2.5)

## 2018-01-13 MED ORDER — TIOTROPIUM BROMIDE MONOHYDRATE 2.5 MCG/ACT IN AERS: 2.0000 | INHALATION_SPRAY | Freq: Every day | RESPIRATORY_TRACT | 11 refills | Status: DC

## 2018-01-13 MED ORDER — ALBUTEROL SULFATE (2.5 MG/3ML) 0.083% IN NEBU: 2.5000 mg | INHALATION_SOLUTION | Freq: Four times a day (QID) | RESPIRATORY_TRACT | 3 refills | Status: DC | PRN

## 2018-01-13 NOTE — Assessment & Plan Note (Signed)
Reviewed spirometry from 03/2017 - discussed controller spiriva and need to take daily, as well as rescue albuterol and indications when to use. Will trial spiriva respimat inhaler. Consider updated spirometry.

## 2018-01-13 NOTE — Progress Notes (Signed)
BP 130/82 (BP Location: Left Arm, Patient Position: Sitting, Cuff Size: Large)   Pulse 60   Temp 97.9 F (36.6 C) (Oral)   Ht 5\' 9"  (1.753 m)   Wt 287 lb 4 oz (130.3 kg)   SpO2 98%   BMI 42.42 kg/m    CC: 6 mo f/u visit Subjective:    Patient ID: Aaron Mitchell, male    DOB: 07-May-1946, 72 y.o.   MRN: [id number]  HPI: Aaron Mitchell is a 72 y.o. male presenting on 01/13/2018 for 6 mo follow up   COPD - continues spiriva but not daily. Uses albuterol inhaler several times a week in the mornings. Would like to switch to albuterol nebulizer because of concern over not getting inhaler fully into lungs. Notes some increased dyspnea. Requests spiriva respimat inhaler in place of handihaler.   Recent B transforaminal S1 steroid injections last month (Dr Sharlet Salina)  Last testosterone injection was 1 week ago. This is followed by urology.   Relevant past medical, surgical, family and social history reviewed and updated as indicated. Interim medical history since our last visit reviewed. Allergies and medications reviewed and updated. Outpatient Medications Prior to Visit  Medication Sig Dispense Refill  . albuterol (PROVENTIL HFA;VENTOLIN HFA) 108 (90 Base) MCG/ACT inhaler Inhale 2 puffs into the lungs every 6 (six) hours as needed for wheezing or shortness of breath. 3 Inhaler 3  . allopurinol (ZYLOPRIM) 300 MG tablet TAKE 1 TABLET(300 MG) BY MOUTH DAILY 90 tablet 3  . amLODipine (NORVASC) 5 MG tablet TAKE 1 TABLET(5 MG) BY MOUTH TWICE DAILY 180 tablet 2  . aspirin EC 81 MG tablet Take 81 mg by mouth daily.    [name] Kitchen atorvastatin (LIPITOR) 40 MG tablet Take 1 tablet (40 mg total) by mouth daily. 90 tablet 3  . diclofenac (VOLTAREN) 75 MG EC tablet TAKE 1 TABLET BY MOUTH TWICE DAILY AS NEEDED. CAUTION: LONG-TERM USE CAN CAUSE KIDNEY PROBLEMS, RISK OF HEART ATTACK 60 tablet 6  . diclofenac sodium (VOLTAREN) 1 % GEL Apply 2 g topically daily as needed. Apply to both hands.    . fluticasone  (FLONASE) 50 MCG/ACT nasal spray SPRAY TWICE IN EACH NOSTRIL EVERY DAY 16 g 5  . HYDROcodone-homatropine (HYCODAN) 5-1.5 MG/5ML syrup Take 5 mLs by mouth 2 (two) times daily as needed for cough (sedation precautions). 140 mL 0  . levocetirizine (XYZAL) 5 MG tablet TAKE 1 TABLET(5 MG) BY MOUTH EVERY EVENING 90 tablet 2  . lisinopril-hydrochlorothiazide (PRINZIDE,ZESTORETIC) 20-12.5 MG tablet TAKE 1 TABLET BY MOUTH DAILY 90 tablet 3  . metaxalone (SKELAXIN) 800 MG tablet Take 1 tablet (800 mg total) by mouth 3 (three) times daily as needed for muscle spasms. 270 tablet 0  . Multiple Vitamins tablet Take 1 tablet by mouth daily.    . naproxen sodium (ANAPROX) 220 MG tablet Take 440 mg by mouth daily.     . nitroGLYCERIN (NITRODUR - DOSED IN MG/24 HR) 0.2 mg/hr patch USE 1/2 PATCH DAILY 30 patch 6  . sildenafil (VIAGRA) 100 MG tablet Take by mouth.    . tadalafil (CIALIS) 5 MG tablet Take by mouth.    . testosterone cypionate (DEPOTESTOSTERONE CYPIONATE) 200 MG/ML injection Inject 0.5 mLs (100 mg total) into the muscle once a week. 10 mL 2  . traMADol (ULTRAM) 50 MG tablet TAKE 1 TABLET BY MOUTH EVERY 8 HOURS AS NEEDED FOR MODERATE PAIN 30 tablet 0  . zolpidem (AMBIEN) 10 MG tablet Take 1 tablet (10 mg total) by mouth  at bedtime as needed. For sleep. 30 tablet 0  . levocetirizine (XYZAL) 5 MG tablet Take 1 tablet (5 mg total) by mouth every evening. 90 tablet 3  . tiotropium (SPIRIVA HANDIHALER) 18 MCG inhalation capsule Place 1 capsule (18 mcg total) into inhaler and inhale daily. 90 capsule 3  . azithromycin (ZITHROMAX) 250 MG tablet Take two tablets on day one followed by one tablet on days 2-5 6 each 0  . cephALEXin (KEFLEX) 500 MG capsule Take 1 capsule (500 mg total) by mouth 3 (three) times daily. 21 capsule 0  . predniSONE (DELTASONE) 20 MG tablet Take two tablets daily for 4 days followed by one tablet daily for 4 days 12 tablet 0   No facility-administered medications prior to visit.       Per HPI unless specifically indicated in ROS section below Review of Systems     Objective:    BP 130/82 (BP Location: Left Arm, Patient Position: Sitting, Cuff Size: Large)   Pulse 60   Temp 97.9 F (36.6 C) (Oral)   Ht 5\' 9"  (1.753 m)   Wt 287 lb 4 oz (130.3 kg)   SpO2 98%   BMI 42.42 kg/m   Wt Readings from Last 3 Encounters:  01/13/18 287 lb 4 oz (130.3 kg)  10/01/17 277 lb (125.6 kg)  09/08/17 272 lb 12 oz (123.7 kg)    Physical Exam  Constitutional: He appears well-developed and well-nourished. No distress.  HENT:  Mouth/Throat: Oropharynx is clear and moist. No oropharyngeal exudate.  Cardiovascular: Normal rate and regular rhythm.  Murmur (2/6 systolic) heard. Pulmonary/Chest: Effort normal and breath sounds normal. No respiratory distress. He has no wheezes. He has no rales.  Musculoskeletal: He exhibits no edema.  Nursing note and vitals reviewed.  Results for orders placed or performed in visit on 01/13/18  Comprehensive metabolic panel  Result Value Ref Range   Sodium 137 135 - 145 mEq/L   Potassium 3.8 3.5 - 5.1 mEq/L   Chloride 104 96 - 112 mEq/L   CO2 24 19 - 32 mEq/L   Glucose, Bld 140 (H) 70 - 99 mg/dL   BUN 14 6 - 23 mg/dL   Creatinine, Ser 1.02 0.40 - 1.50 mg/dL   Total Bilirubin 0.8 0.2 - 1.2 mg/dL   Alkaline Phosphatase 53 39 - 117 U/L   AST 24 0 - 37 U/L   ALT 34 0 - 53 U/L   Total Protein 6.6 6.0 - 8.3 g/dL   Albumin 4.2 3.5 - 5.2 g/dL   Calcium 9.0 8.4 - 10.5 mg/dL   GFR 76.79 >60.00 mL/min  Magnesium  Result Value Ref Range   Magnesium 1.5 1.5 - 2.5 mg/dL   Lab Results  Component Value Date   HGBA1C 5.9 09/14/2016       Assessment & Plan:   Problem List Items Addressed This Visit    Stage 3 severe COPD by GOLD classification ([place]) - Primary    Reviewed spirometry from 03/2017 - discussed controller spiriva and need to take daily, as well as rescue albuterol and indications when to use. Will trial spiriva respimat inhaler.  Consider updated spirometry.       Relevant Medications   Tiotropium Bromide Monohydrate (SPIRIVA RESPIMAT) 2.5 MCG/ACT AERS   albuterol (PROVENTIL) (2.5 MG/3ML) 0.083% nebulizer solution   Prediabetes    Will see if we can add A1c, if not will have patient return for fingerstick. Ordered.       Relevant Orders   POCT  glycosylated hemoglobin (Hb A1C)   Male hypogonadism    Followed by urology, upcoming appt next month.       Relevant Orders   Comprehensive metabolic panel (Completed)    Other Visit Diagnoses    Low magnesium level       Relevant Orders   Magnesium (Completed)       Meds ordered this encounter  Medications  . Tiotropium Bromide Monohydrate (SPIRIVA RESPIMAT) 2.5 MCG/ACT AERS    Sig: Inhale 2 puffs into the lungs daily.    Dispense:  4 g    Refill:  11  . albuterol (PROVENTIL) (2.5 MG/3ML) 0.083% nebulizer solution    Sig: Take 3 mLs (2.5 mg total) by nebulization every 6 (six) hours as needed for wheezing or shortness of breath.    Dispense:  90 mL    Refill:  3   Orders Placed This Encounter  Procedures  . Comprehensive metabolic panel  . Magnesium  . POCT glycosylated hemoglobin (Hb A1C)    Standing Status:   Future    Standing Expiration Date:   04/15/2018    Follow up plan: No follow-ups on file.  [name], MD

## 2018-01-13 NOTE — Assessment & Plan Note (Signed)
Will see if we can add A1c, if not will have patient return for fingerstick. Ordered.

## 2018-01-13 NOTE — Assessment & Plan Note (Signed)
Followed by urology, upcoming appt next month.

## 2018-01-13 NOTE — Patient Instructions (Addendum)
Ok to try albuterol nebulizer - use this as rescue medicine for symptoms of shortness of breath, wheezing and cough.  Continue spiriva daily for prevention.  Prescription provided for nebulizer machine to take to [facility] store if needed Labs today.

## 2018-01-14 ENCOUNTER — Telehealth: Payer: Self-pay | Admitting: Radiology

## 2018-01-14 ENCOUNTER — Encounter: Payer: Self-pay | Admitting: Family Medicine

## 2018-01-14 NOTE — Telephone Encounter (Signed)
I spoke to the patient, he is scheduled to see his Urologist in a couple of weeks and wants his A1C checked at that time. He said he sent Dr [name] this request via email

## 2018-01-14 NOTE — Telephone Encounter (Signed)
LM for patient, Dr [name] wants him to come in for a lab appt only for a poct A1C.

## 2018-01-14 NOTE — Telephone Encounter (Signed)
Pt will call back to schedule

## 2018-01-17 ENCOUNTER — Ambulatory Visit (INDEPENDENT_AMBULATORY_CARE_PROVIDER_SITE_OTHER): Payer: PPO | Admitting: Family Medicine

## 2018-01-17 ENCOUNTER — Encounter: Payer: Self-pay | Admitting: Family Medicine

## 2018-01-17 VITALS — BP 140/70 | HR 56 | Temp 97.8°F | Ht 69.0 in | Wt 288.8 lb

## 2018-01-17 DIAGNOSIS — M7061 Trochanteric bursitis, right hip: Secondary | ICD-10-CM | POA: Diagnosis not present

## 2018-01-17 DIAGNOSIS — R7303 Prediabetes: Secondary | ICD-10-CM | POA: Diagnosis not present

## 2018-01-17 DIAGNOSIS — M6798 Unspecified disorder of synovium and tendon, other site: Secondary | ICD-10-CM | POA: Diagnosis not present

## 2018-01-17 DIAGNOSIS — M7062 Trochanteric bursitis, left hip: Secondary | ICD-10-CM

## 2018-01-17 DIAGNOSIS — M67951 Unspecified disorder of synovium and tendon, right thigh: Secondary | ICD-10-CM

## 2018-01-17 LAB — POCT GLYCOSYLATED HEMOGLOBIN (HGB A1C): Hemoglobin A1C: 6.3 % — AB (ref 4.0–5.6)

## 2018-01-17 MED ORDER — METHYLPREDNISOLONE ACETATE 40 MG/ML IJ SUSP
80.0000 mg | Freq: Once | INTRAMUSCULAR | Status: AC
  Administered 2018-01-17: 80 mg via INTRA_ARTICULAR

## 2018-01-17 NOTE — Progress Notes (Signed)
Dr. Frederico Hamman T. Kailynne Ferrington, MD, [facility] [place], [address] Phone: [phone] Fax: [phone]  01/17/2018  Patient: Aaron Mitchell, MRN: [id number], DOB: 01-27-1946, 72 y.o.  Primary Physician:  [name], MD   Chief Complaint  Patient presents with  . Hip Pain    Left   Subjective:   Aaron Mitchell is a 72 y.o. very pleasant male patient who presents with the following:  Pleasant gentleman who I remember quite well from prior office visits who presents with bilateral hip pain.  I have seen him before and done an injection in his left hip most recently 4 months ago.  He also has some lumbar radicular symptoms that he has gotten steroid injections  by physiatry.  His pain is greatest on the left, but he also has some pain on the right side.  lidocaine  Past Medical History, Surgical History, Social History, Family History, Problem List, Medications, and Allergies have been reviewed and updated if relevant.  Patient Active Problem List   Diagnosis Date Noted  . Background diabetic retinopathy ([place]) 08/17/2017  . Greater trochanteric bursitis of right hip 07/30/2017  . Health maintenance examination 07/14/2017  . Advanced care planning/counseling discussion 07/14/2017  . Stage 3 severe COPD by GOLD classification ([place]) 03/31/2017  . Chest congestion 12/08/2016  . Moderate mitral insufficiency 09/14/2016  . Male hypogonadism   . Insomnia 05/12/2016  . OSA (obstructive sleep apnea) 05/12/2016  . Chronic back pain   . Degenerative disc disease, lumbar   . Left Achilles tendinitis 10/04/2015  . HLD (hyperlipidemia) 09/17/2015  . ED (erectile dysfunction) of organic origin 03/25/2015  . CMC arthritis, thumb, degenerative 01/19/2013  . Triggering of digit 01/19/2013  . Rotator cuff impingement syndrome 10/20/2011  . Chronic gouty arthropathy 04/02/2011  . Severe obesity (BMI 35.0-39.9) with comorbidity  ([place]) 04/02/2011  . Osteoarthritis 04/02/2011  . Allergic rhinitis, seasonal 04/02/2011  . Chronic shoulder pain 04/02/2011  . Prediabetes 03/10/2011  . Essential (primary) hypertension 03/10/2011    Past Medical History:  Diagnosis Date  . Achilles tendinitis    Left leg  . Borderline diabetes mellitus   . Childhood asthma   . Degenerative disc disease, lumbar    rec against surgery  . Erectile dysfunction   . GI bleed 12/2015   hospitalization  . Gout   . Hypertension   . Ileus ([place]) 12/2015   hospitalization   . Male hypogonadism    followed by urology on T injections ([place])  . Seasonal allergies    pollen  . Severe obesity (BMI >= 40) ([facility])   . Shoulder bursitis   . Sleep apnea    CPAP    Past Surgical History:  Procedure Laterality Date  . ADENOIDECTOMY    . CARPAL TUNNEL RELEASE Right 2015  . CATARACT EXTRACTION W/PHACO Right 07/15/2016   Procedure: CATARACT EXTRACTION PHACO AND INTRAOCULAR LENS PLACEMENT (IOC);  Surgeon: Leandrew Koyanagi, MD;  Location: [facility];  Service: Ophthalmology;  Laterality: Right;  TORIC sleep apnea  . CATARACT EXTRACTION W/PHACO Left 08/05/2016   Procedure: CATARACT EXTRACTION PHACO AND INTRAOCULAR LENS PLACEMENT (IOC) toric;  Surgeon: Leandrew Koyanagi, MD;  Location: [facility];  Service: Ophthalmology;  Laterality: Left;  left sleep apnea toric  . COLONOSCOPY  10/2012   rpt 5 yrs ([facility])  . EYE SURGERY     Lens replacement 07/12/2016,08/05/2016  . LUMBAR EPIDURAL INJECTION Bilateral 04/2017, 12/2017   S1 transforaminal  ESI ([place])  . RHINOPLASTY    . TONSILLECTOMY  ?1954  . TRIGGER FINGER RELEASE Right 11-19-13   4th    Social History   Socioeconomic History  . Marital status: Married    Spouse name: Not on file  . Number of children: Not on file  . Years of education: Not on file  . Highest education level: Not on file  Occupational History  . Not on file  Social Needs  . Financial  resource strain: Not on file  . Food insecurity:    Worry: Not on file    Inability: Not on file  . Transportation needs:    Medical: Not on file    Non-medical: Not on file  Tobacco Use  . Smoking status: Former Smoker    Last attempt to quit: 07/27/1978    Years since quitting: 39.5  . Smokeless tobacco: Never Used  . Tobacco comment: Quit in 11/20/1978  Substance and Sexual Activity  . Alcohol use: Yes    Alcohol/week: 3.0 oz    Types: 5 Shots of liquor per week    Comment:    . Drug use: No  . Sexual activity: Not on file  Lifestyle  . Physical activity:    Days per week: Not on file    Minutes per session: Not on file  . Stress: Not on file  Relationships  . Social connections:    Talks on phone: Not on file    Gets together: Not on file    Attends religious service: Not on file    Active member of club or organization: Not on file    Attends meetings of clubs or organizations: Not on file    Relationship status: Not on file  . Intimate partner violence:    Fear of current or ex partner: Not on file    Emotionally abused: Not on file    Physically abused: Not on file    Forced sexual activity: Not on file  Other Topics Concern  . Not on file  Social History Narrative   Lives at home with wife [name]), dog passed away 20-Nov-2015   1 grown child   Occ: retired Clinical biochemist, was Chief Technology Officer    Edu: 2 yrs college   Activity: no regular exercise   Diet: good water, fruits daily, splenda iced tea    Family History  Problem Relation Age of Onset  . Alcohol abuse Father   . Hypertension Father   . AVM Father 86       hemorrhagic stroke  . Diabetes Paternal Grandmother   . Cancer Neg Hx   . CAD Neg Hx   . Prostate cancer Neg Hx   . Bladder Cancer Neg Hx   . Kidney cancer Neg Hx     Allergies  Allergen Reactions  . Codeine Other (See Comments)    Other reaction(s): Other (See Comments) HEADACHES Causes headaches when he stops liquid Codeine-based products     Medication list reviewed and updated in full in [place].  GEN: No fevers, chills. Nontoxic. Primarily MSK c/o today. MSK: Detailed in the HPI GI: tolerating PO intake without difficulty Neuro: No numbness, parasthesias, or tingling associated. Otherwise the pertinent positives of the ROS are noted above.   Objective:   BP 140/70   Pulse (!) 56   Temp 97.8 F (36.6 C) (Oral)   Ht 5\' 9"  (1.753 m)   Wt 288 lb 12 oz (131 kg)   BMI 42.64  kg/m    GEN: WDWN, NAD, Non-toxic, Alert & Oriented x 3 HEENT: Atraumatic, Normocephalic.  Ears and Nose: No external deformity. EXTR: No clubbing/cyanosis/edema NEURO: Normal gait.  PSYCH: Normally interactive. Conversant. Not depressed or anxious appearing.  Calm demeanor.   HIP EXAM: SIDE: B ROM: Abduction, Flexion, Internal and External range of motion: full Pain with terminal IROM and EROM: no GTB: ttp L > R SLR: NEG Knees: No effusion FABER: NT REVERSE FABER: NT, neg Piriformis: NT at direct palpation Str: flexion: 5/5 abduction: 5/5 adduction: 5/5 Strength testing non-tender     Radiology: No results found.  Assessment and Plan:   Trochanteric bursitis of both hips - Plan: methylPREDNISolone acetate (DEPO-MEDROL) injection 80 mg, methylPREDNISolone acetate (DEPO-MEDROL) injection 80 mg  Prediabetes - Plan: POCT glycosylated hemoglobin (Hb A1C)  Tendinopathy of right gluteus medius  Pleasant gentleman, known well and he has a history of bilateral trochanteric bursitis intermittently.  This is bothering him currently on the left now greater than the right.  Clinically he also has some tendinopathy of the right gluteus medius.  With both cases, the patient requested additional anesthesia over and above typical ethyl chloride use, so I used approximately 7 cc of 1% lidocaine.  Trochanteric Bursitis Injection, R Verbal consent obtained. Risks (including infection, potential atrophy), benefits, and alternatives  reviewed. Greater trochanter sterilely prepped with Chloraprep. Ethyl Chloride used for anesthesia. 8 cc of Lidocaine 1% injected with 2 mL of Depo-Medrol 40 mg into trochanteric bursa at area of maximal tenderness at greater trochanter, and 50% of volume injected in this region, and 50% of volume injected at the insertion of the gluteus medius caudal to this.. Needle taken to bone to troch bursa, flows easily. Bursa massaged. No bleeding and no complications. Decreased pain after injection. Needle: 22 gauge spinal needle   Trochanteric Bursitis Injection, L Verbal consent obtained. Risks (including infection, potential atrophy), benefits, and alternatives reviewed. Greater trochanter sterilely prepped with Chloraprep. Ethyl Chloride used for anesthesia. 8 cc of Lidocaine 1% injected with 2 mL of Depo-Medrol 40 mg into trochanteric bursa at area of maximal tenderness at greater trochanter. Needle taken to bone to troch bursa, flows easily. Bursa massaged. No bleeding and no complications. Decreased pain after injection. Needle: 22 gauge spinal needle   Follow-up: prn  Meds ordered this encounter  Medications  . methylPREDNISolone acetate (DEPO-MEDROL) injection 80 mg  . methylPREDNISolone acetate (DEPO-MEDROL) injection 80 mg   Signed,  Shubham Thackston T. Lovada Barwick, MD   Allergies as of 01/17/2018      Reactions   Codeine Other (See Comments)   Other reaction(s): Other (See Comments) HEADACHES Causes headaches when he stops liquid Codeine-based products      Medication List        Accurate as of 01/17/18  2:27 PM. Always use your most recent med list.          albuterol 108 (90 Base) MCG/ACT inhaler Commonly known as:  PROVENTIL HFA;VENTOLIN HFA Inhale 2 puffs into the lungs every 6 (six) hours as needed for wheezing or shortness of breath.   albuterol (2.5 MG/3ML) 0.083% nebulizer solution Commonly known as:  PROVENTIL Take 3 mLs (2.5 mg total) by nebulization every 6 (six) hours as  needed for wheezing or shortness of breath.   allopurinol 300 MG tablet Commonly known as:  ZYLOPRIM TAKE 1 TABLET(300 MG) BY MOUTH DAILY   amLODipine 5 MG tablet Commonly known as:  NORVASC TAKE 1 TABLET(5 MG) BY MOUTH TWICE DAILY   aspirin  EC 81 MG tablet Take 81 mg by mouth daily.   atorvastatin 40 MG tablet Commonly known as:  LIPITOR Take 1 tablet (40 mg total) by mouth daily.   CIALIS 5 MG tablet Generic drug:  tadalafil Take by mouth.   diclofenac 75 MG EC tablet Commonly known as:  VOLTAREN TAKE 1 TABLET BY MOUTH TWICE DAILY AS NEEDED. CAUTION: LONG-TERM USE CAN CAUSE KIDNEY PROBLEMS, RISK OF HEART ATTACK   diclofenac sodium 1 % Gel Commonly known as:  VOLTAREN Apply 2 g topically daily as needed. Apply to both hands.   fluticasone 50 MCG/ACT nasal spray Commonly known as:  FLONASE SPRAY TWICE IN EACH NOSTRIL EVERY DAY   HYDROcodone-homatropine 5-1.5 MG/5ML syrup Commonly known as:  HYCODAN Take 5 mLs by mouth 2 (two) times daily as needed for cough (sedation precautions).   levocetirizine 5 MG tablet Commonly known as:  XYZAL TAKE 1 TABLET(5 MG) BY MOUTH EVERY EVENING   lisinopril-hydrochlorothiazide 20-12.5 MG tablet Commonly known as:  PRINZIDE,ZESTORETIC TAKE 1 TABLET BY MOUTH DAILY   metaxalone 800 MG tablet Commonly known as:  SKELAXIN Take 1 tablet (800 mg total) by mouth 3 (three) times daily as needed for muscle spasms.   Multiple Vitamins tablet Take 1 tablet by mouth daily.   naproxen sodium 220 MG tablet Commonly known as:  ALEVE Take 440 mg by mouth daily.   nitroGLYCERIN 0.2 mg/hr patch Commonly known as:  NITRODUR - Dosed in mg/24 hr USE 1/2 PATCH DAILY   testosterone cypionate 200 MG/ML injection Commonly known as:  DEPOTESTOSTERONE CYPIONATE Inject 0.5 mLs (100 mg total) into the muscle once a week.   Tiotropium Bromide Monohydrate 2.5 MCG/ACT Aers Commonly known as:  SPIRIVA RESPIMAT Inhale 2 puffs into the lungs daily.     traMADol 50 MG tablet Commonly known as:  ULTRAM TAKE 1 TABLET BY MOUTH EVERY 8 HOURS AS NEEDED FOR MODERATE PAIN   VIAGRA 100 MG tablet Generic drug:  sildenafil Take by mouth.   zolpidem 10 MG tablet Commonly known as:  AMBIEN Take 1 tablet (10 mg total) by mouth at bedtime as needed. For sleep.

## 2018-01-20 ENCOUNTER — Telehealth: Payer: Self-pay | Admitting: Urology

## 2018-01-20 NOTE — Telephone Encounter (Signed)
Pt calls needing a refill Rx for his testosterone cypionate 200 mg/mL injecting 1/2 cc once per week. [facility] in [place]. Please contact pt at [phone]

## 2018-01-21 ENCOUNTER — Other Ambulatory Visit: Payer: Self-pay | Admitting: Family Medicine

## 2018-01-21 MED ORDER — TESTOSTERONE CYPIONATE 200 MG/ML IM SOLN: 100.0000 mg | INTRAMUSCULAR | 0 refills | Status: DC

## 2018-01-22 ENCOUNTER — Other Ambulatory Visit: Payer: Self-pay | Admitting: Family Medicine

## 2018-01-22 MED ORDER — MAGNESIUM 500 MG PO TABS: 250.0000 mg | ORAL_TABLET | ORAL | Status: DC

## 2018-01-24 ENCOUNTER — Telehealth: Payer: Self-pay | Admitting: Family Medicine

## 2018-01-24 NOTE — Telephone Encounter (Signed)
Copied from [place] [id number]. Topic: Inquiry >> Jan 24, 2018  4:02 PM Mylinda Latina, NT wrote: Reason for CRM: Patient called and states he would like to do the Cologuard this time around. He would rather do this instead of the colonoscopy.

## 2018-01-24 NOTE — Telephone Encounter (Signed)
Colonoscopy 2014. I don't have report. Ok to set pt up for cologuard.  Thank you

## 2018-01-24 NOTE — Telephone Encounter (Signed)
Pt states this has been handled

## 2018-01-25 NOTE — Telephone Encounter (Signed)
Noted.  Faxed form to [employer].

## 2018-01-30 DIAGNOSIS — Z1212 Encounter for screening for malignant neoplasm of rectum: Secondary | ICD-10-CM | POA: Diagnosis not present

## 2018-01-30 DIAGNOSIS — Z1211 Encounter for screening for malignant neoplasm of colon: Secondary | ICD-10-CM | POA: Diagnosis not present

## 2018-02-08 ENCOUNTER — Other Ambulatory Visit: Payer: Self-pay

## 2018-02-09 LAB — COLOGUARD: COLOGUARD: NEGATIVE

## 2018-02-10 ENCOUNTER — Ambulatory Visit: Payer: Self-pay

## 2018-02-15 ENCOUNTER — Other Ambulatory Visit: Payer: Self-pay | Admitting: Family Medicine

## 2018-02-15 DIAGNOSIS — E291 Testicular hypofunction: Secondary | ICD-10-CM

## 2018-02-18 ENCOUNTER — Other Ambulatory Visit: Payer: PPO

## 2018-02-18 DIAGNOSIS — E291 Testicular hypofunction: Secondary | ICD-10-CM

## 2018-02-18 DIAGNOSIS — Z125 Encounter for screening for malignant neoplasm of prostate: Secondary | ICD-10-CM | POA: Diagnosis not present

## 2018-02-19 LAB — CBC WITH DIFFERENTIAL/PLATELET
BASOS: 1 %
Basophils Absolute: 0 10*3/uL (ref 0.0–0.2)
EOS (ABSOLUTE): 0.3 10*3/uL (ref 0.0–0.4)
Eos: 5 %
HEMATOCRIT: 51.9 % — AB (ref 37.5–51.0)
Hemoglobin: 17.7 g/dL (ref 13.0–17.7)
IMMATURE GRANULOCYTES: 1 %
Immature Grans (Abs): 0 10*3/uL (ref 0.0–0.1)
LYMPHS ABS: 2.2 10*3/uL (ref 0.7–3.1)
Lymphs: 40 %
MCH: 31.1 pg (ref 26.6–33.0)
MCHC: 34.1 g/dL (ref 31.5–35.7)
MCV: 91 fL (ref 79–97)
Monocytes Absolute: 0.5 10*3/uL (ref 0.1–0.9)
Monocytes: 8 %
NEUTROS PCT: 45 %
Neutrophils Absolute: 2.5 10*3/uL (ref 1.4–7.0)
PLATELETS: 135 10*3/uL — AB (ref 150–450)
RBC: 5.7 x10E6/uL (ref 4.14–5.80)
RDW: 13.7 % (ref 12.3–15.4)
WBC: 5.5 10*3/uL (ref 3.4–10.8)

## 2018-02-19 LAB — TESTOSTERONE: Testosterone: 740 ng/dL (ref 264–916)

## 2018-02-19 LAB — PSA: Prostate Specific Ag, Serum: 1.3 ng/mL (ref 0.0–4.0)

## 2018-02-19 IMAGING — DX DG CHEST 2V
2 series · 2 of 2 positions shown · non-contrast
Comparison: None.

CLINICAL DATA: Cough and chest congestion.

EXAM:
CHEST  2 VIEW

[chest pa]
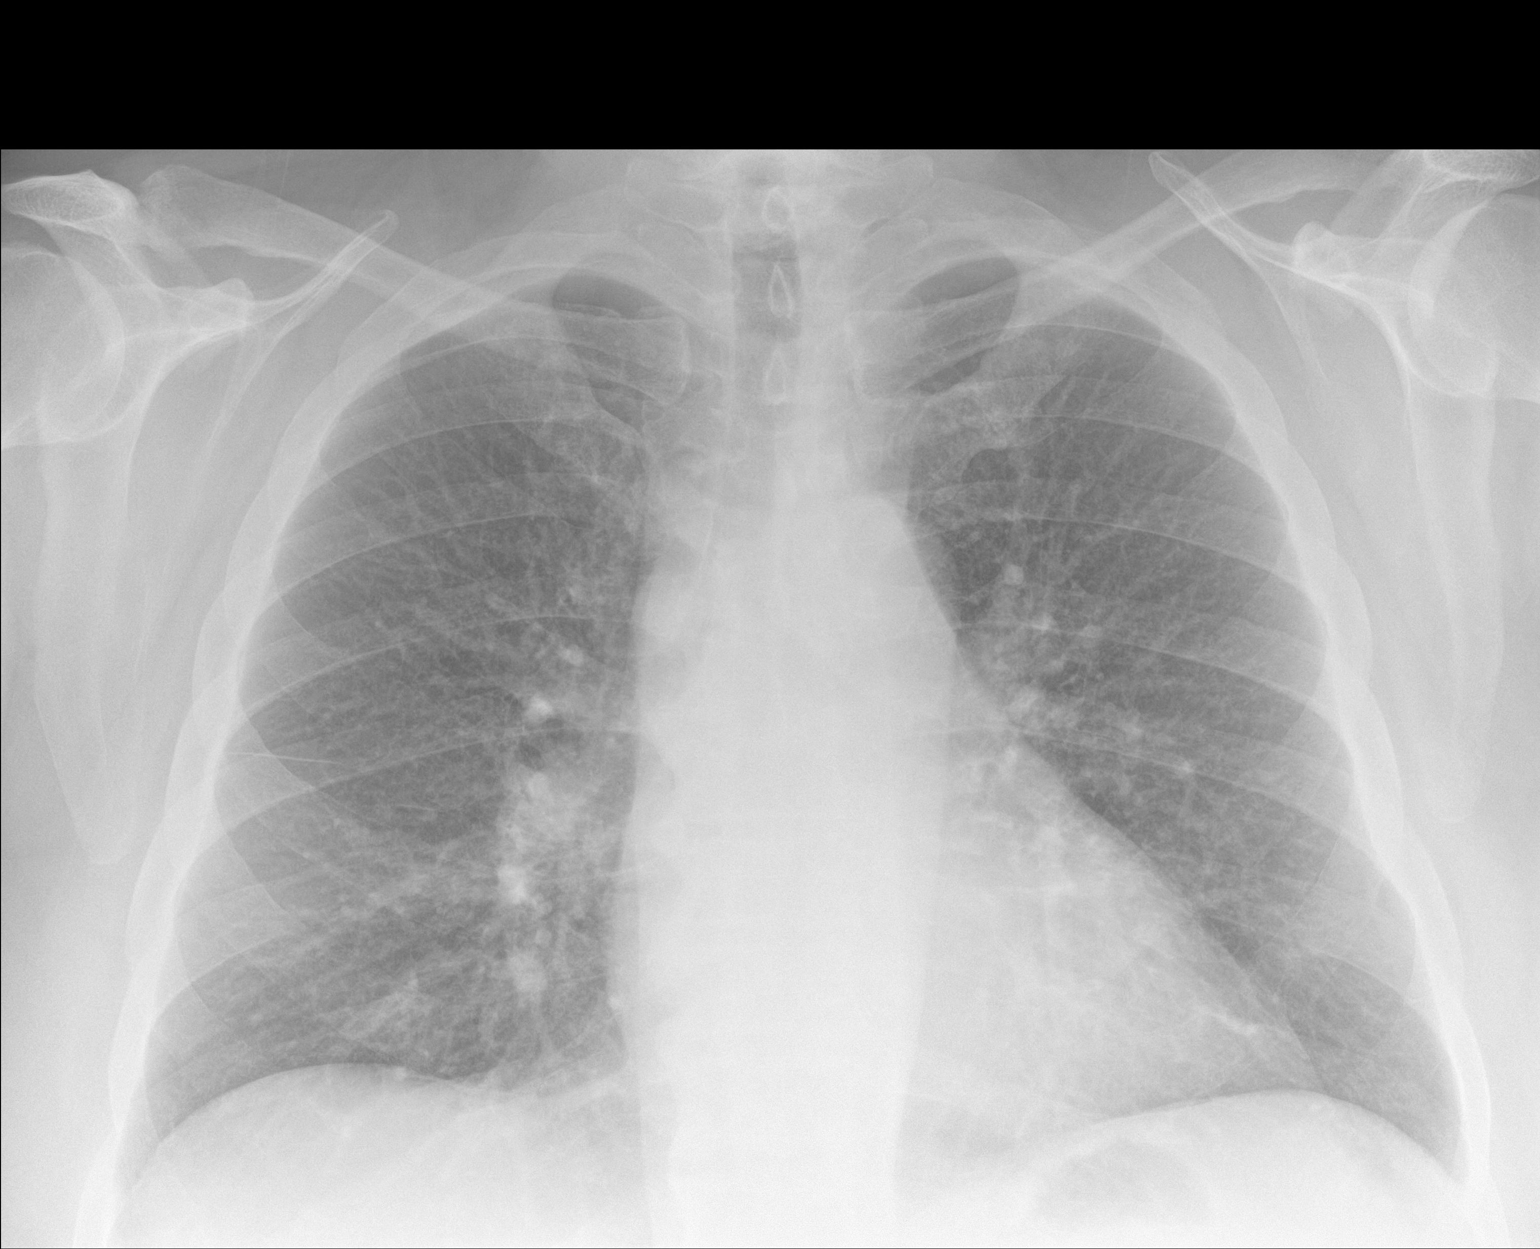

[chest ap]
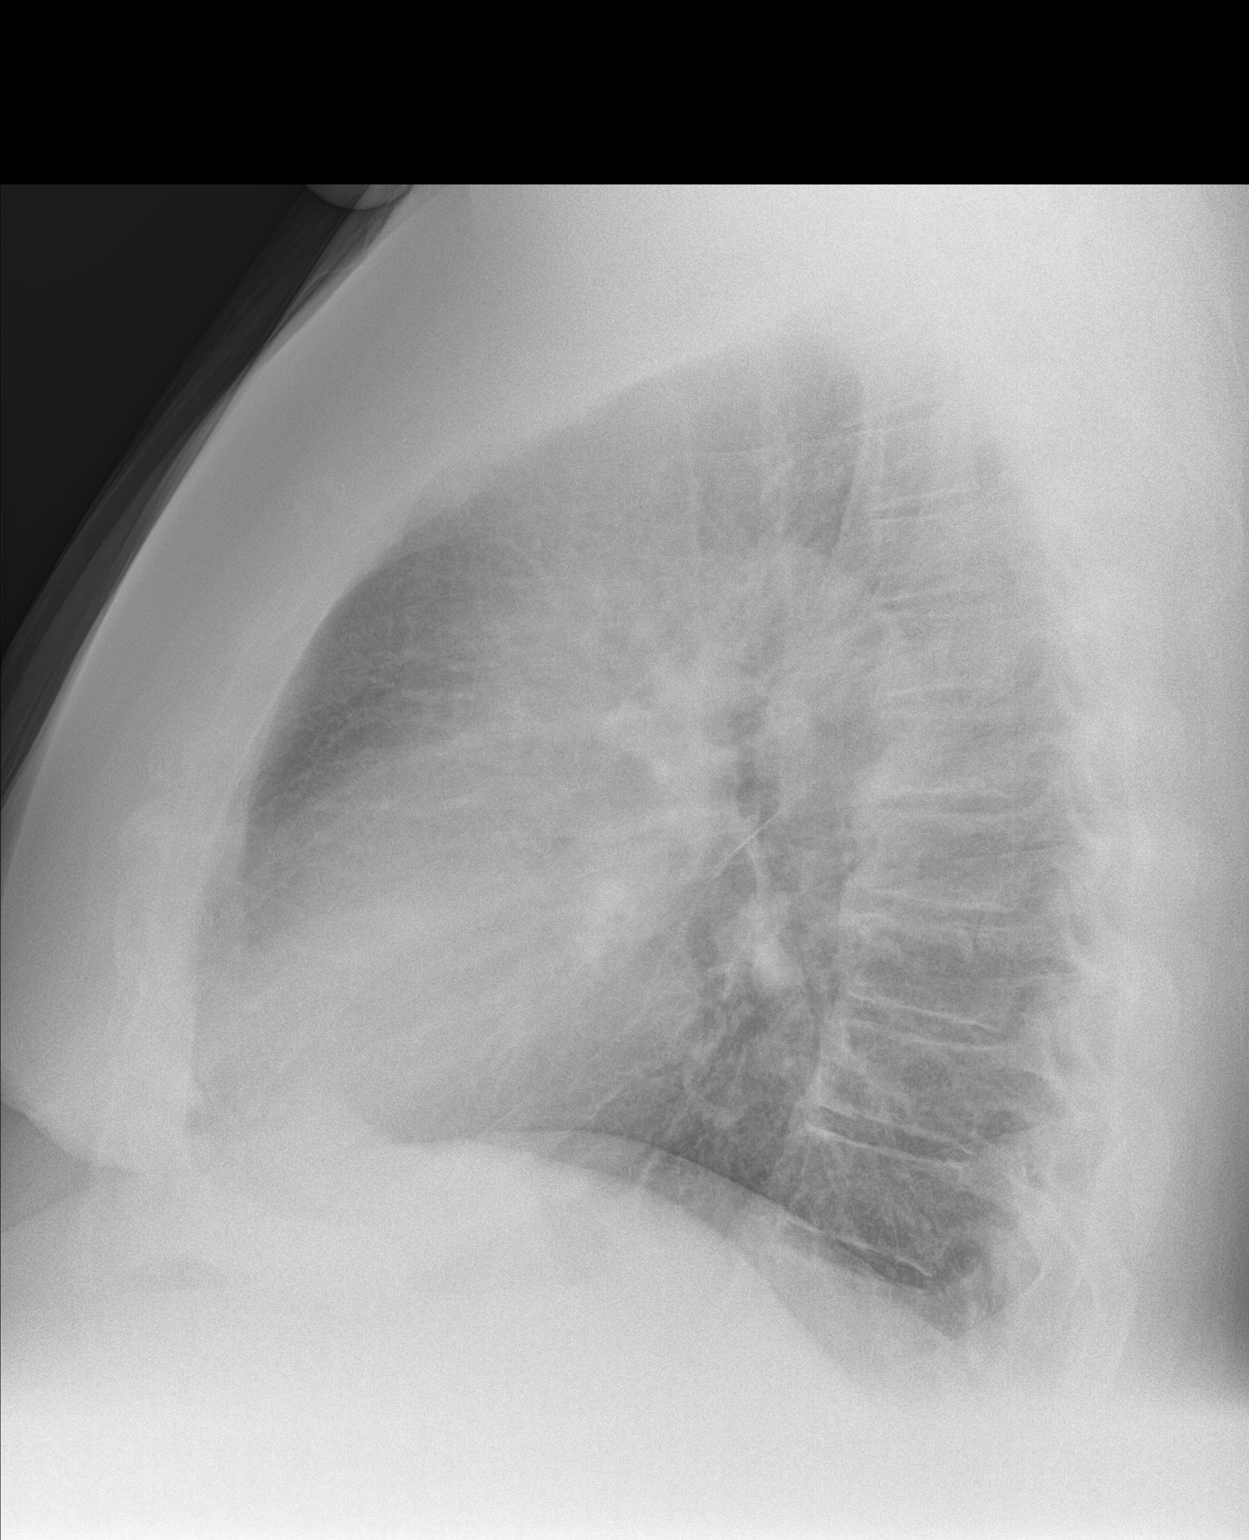

[2 of 2 positions shown; findings below may reference images not displayed]

FINDINGS: The heart size and mediastinal contours are within normal limits.
Both lungs are clear. The visualized skeletal structures are
unremarkable.
IMPRESSION: No active cardiopulmonary disease.

## 2018-02-21 ENCOUNTER — Encounter: Payer: Self-pay | Admitting: Family Medicine

## 2018-02-21 ENCOUNTER — Telehealth: Payer: Self-pay

## 2018-02-21 NOTE — Telephone Encounter (Signed)
Received faxed results of Cologuard. [Abstracted and placed to be scanned.]  Spoke with pt notifying him of negative results. Pt verbalized understanding and expresses thanks.

## 2018-02-23 DIAGNOSIS — G4733 Obstructive sleep apnea (adult) (pediatric): Secondary | ICD-10-CM | POA: Diagnosis not present

## 2018-02-24 ENCOUNTER — Telehealth: Payer: Self-pay | Admitting: Family Medicine

## 2018-02-24 NOTE — Telephone Encounter (Signed)
Copied from [place] [id number]. Topic: Quick Communication - Rx Refill/Question >> Feb 24, 2018  4:10 PM Neva Seat wrote: Tiotropium Bromide Monohydrate (SPIRIVA RESPIMAT) 2.5 MCG/ACT AERS   pt needing to know how often to take the medication. Please call pt back to discuss.

## 2018-02-25 NOTE — Telephone Encounter (Signed)
Spoke with pt answering med questions.  Pt verbalizes understanding.

## 2018-03-03 ENCOUNTER — Ambulatory Visit: Payer: PPO | Admitting: Urology

## 2018-03-03 ENCOUNTER — Encounter: Payer: Self-pay | Admitting: Urology

## 2018-03-03 ENCOUNTER — Other Ambulatory Visit: Payer: Self-pay

## 2018-03-03 VITALS — BP 138/64 | HR 66 | Ht 70.0 in | Wt 285.4 lb

## 2018-03-03 DIAGNOSIS — E291 Testicular hypofunction: Secondary | ICD-10-CM

## 2018-03-03 NOTE — Progress Notes (Signed)
Aaron Mitchell   03/03/2018 1:58 PM   Aaron Mitchell [birth year] [id number]  Referring provider: [name], MD [address] [place],  [address]  Chief Complaint  Patient presents with  . Hypogonadism    HPI:  [name] returns with a history of hypogonadism who transferred care to [facility] January 2019 from Dr. Jacqlyn Mitchell.  He has been on testosterone replacement therapy for several years.  He is on 0.5 mL oral 100 mg once a week IM. His July 2019 PSA was 1.3, testosterone 740 and hematocrit 51.9.   PMH: Past Medical History:  Diagnosis Date  . Achilles tendinitis    Left leg  . Borderline diabetes mellitus   . Childhood asthma   . Degenerative disc disease, lumbar    rec against surgery  . Erectile dysfunction   . Mitchell bleed 12/2015   hospitalization  . Gout   . Hypertension   . Ileus ([place]) 12/2015   hospitalization   . Male hypogonadism    followed by urology on T injections ([place])  . Seasonal allergies    pollen  . Severe obesity (BMI >= 40) ([facility])   . Shoulder bursitis   . Sleep apnea    CPAP    Surgical History: Past Surgical History:  Procedure Laterality Date  . ADENOIDECTOMY    . CARPAL TUNNEL RELEASE Right 2015  . CATARACT EXTRACTION W/PHACO Right 07/15/2016   Procedure: CATARACT EXTRACTION PHACO AND INTRAOCULAR LENS PLACEMENT (IOC);  Surgeon: Aaron Koyanagi, MD;  Location: [facility];  Service: Ophthalmology;  Laterality: Right;  TORIC sleep apnea  . CATARACT EXTRACTION W/PHACO Left 08/05/2016   Procedure: CATARACT EXTRACTION PHACO AND INTRAOCULAR LENS PLACEMENT (IOC) toric;  Surgeon: Aaron Koyanagi, MD;  Location: [facility];  Service: Ophthalmology;  Laterality: Left;  left sleep apnea toric  . COLONOSCOPY  10/2012   rpt 5 yrs (Aaron Mitchell)  . EYE SURGERY     Lens replacement 07/12/2016,08/05/2016  . LUMBAR EPIDURAL INJECTION Bilateral 04/2017, 12/2017   S1 transforaminal ESI (Aaron Mitchell)  . RHINOPLASTY    .  TONSILLECTOMY  ?1954  . TRIGGER FINGER RELEASE Right 2015   4th    Home Medications:  Allergies as of 03/03/2018      Reactions   Codeine Other (See Comments)   Other reaction(s): Other (See Comments) HEADACHES Causes headaches when he stops liquid Codeine-based products      Medication List        Accurate as of 03/03/18  1:58 PM. Always use your most recent med list.          albuterol 108 (90 Base) MCG/ACT inhaler Commonly known as:  PROVENTIL HFA;VENTOLIN HFA Inhale 2 puffs into the lungs every 6 (six) hours as needed for wheezing or shortness of breath.   albuterol (2.5 MG/3ML) 0.083% nebulizer solution Commonly known as:  PROVENTIL Take 3 mLs (2.5 mg total) by nebulization every 6 (six) hours as needed for wheezing or shortness of breath.   allopurinol 300 MG tablet Commonly known as:  ZYLOPRIM TAKE 1 TABLET(300 MG) BY MOUTH DAILY   amLODipine 5 MG tablet Commonly known as:  NORVASC TAKE 1 TABLET(5 MG) BY MOUTH TWICE DAILY   aspirin EC 81 MG tablet Take 81 mg by mouth daily.   atorvastatin 40 MG tablet Commonly known as:  LIPITOR Take 1 tablet (40 mg total) by mouth daily.   CIALIS 5 MG tablet Generic drug:  tadalafil Take by mouth.   diclofenac 75 MG EC tablet Commonly known  as:  VOLTAREN TAKE 1 TABLET BY MOUTH TWICE DAILY AS NEEDED. CAUTION: LONG-TERM USE CAN CAUSE KIDNEY PROBLEMS, RISK OF HEART ATTACK   diclofenac sodium 1 % Gel Commonly known as:  VOLTAREN Apply 2 g topically daily as needed. Apply to both hands.   fluticasone 50 MCG/ACT nasal spray Commonly known as:  FLONASE SPRAY TWICE IN EACH NOSTRIL EVERY DAY   HYDROcodone-homatropine 5-1.5 MG/5ML syrup Commonly known as:  HYCODAN Take 5 mLs by mouth 2 (two) times daily as needed for cough (sedation precautions).   levocetirizine 5 MG tablet Commonly known as:  XYZAL TAKE 1 TABLET(5 MG) BY MOUTH EVERY EVENING   lisinopril-hydrochlorothiazide 20-12.5 MG tablet Commonly known as:   PRINZIDE,ZESTORETIC TAKE 1 TABLET BY MOUTH DAILY   Magnesium 500 MG Tabs Take 0.5-1 tablets (250-500 mg total) by mouth every Monday, Wednesday, and Friday.   metaxalone 800 MG tablet Commonly known as:  SKELAXIN Take 1 tablet (800 mg total) by mouth 3 (three) times daily as needed for muscle spasms.   Multiple Vitamins tablet Take 1 tablet by mouth daily.   naproxen sodium 220 MG tablet Commonly known as:  ALEVE Take 440 mg by mouth daily.   nitroGLYCERIN 0.2 mg/hr patch Commonly known as:  NITRODUR - Dosed in mg/24 hr USE 1/2 PATCH DAILY   testosterone cypionate 200 MG/ML injection Commonly known as:  DEPOTESTOSTERONE CYPIONATE Inject 0.5 mLs (100 mg total) into the muscle once a week.   Tiotropium Bromide Monohydrate 2.5 MCG/ACT Aers Inhale 2 puffs into the lungs daily.   traMADol 50 MG tablet Commonly known as:  ULTRAM TAKE 1 TABLET BY MOUTH EVERY 8 HOURS AS NEEDED FOR MODERATE PAIN   VIAGRA 100 MG tablet Generic drug:  sildenafil Take by mouth.   zolpidem 10 MG tablet Commonly known as:  AMBIEN Take 1 tablet (10 mg total) by mouth at bedtime as needed. For sleep.       Allergies:  Allergies  Allergen Reactions  . Codeine Other (See Comments)    Other reaction(s): Other (See Comments) HEADACHES Causes headaches when he stops liquid Codeine-based products    Family History: Family History  Problem Relation Age of Onset  . Alcohol abuse Father   . Hypertension Father   . AVM Father 25       hemorrhagic stroke  . Diabetes Paternal Grandmother   . Cancer Neg Hx   . CAD Neg Hx   . Prostate cancer Neg Hx   . Bladder Cancer Neg Hx   . Kidney cancer Neg Hx     Social History:  reports that he quit smoking about 39 years ago. He has never used smokeless tobacco. He reports that he drinks about 5.0 standard drinks of alcohol per week. He reports that he does not use drugs.  ROS: UROLOGY Frequent Urination?: No Hard to postpone urination?:  No Burning/pain with urination?: No Get up at night to urinate?: No Leakage of urine?: No Urine stream starts and stops?: No Trouble starting stream?: No Do you have to strain to urinate?: No Blood in urine?: No Urinary tract infection?: No Sexually transmitted disease?: No Injury to kidneys or bladder?: No Painful intercourse?: No Weak stream?: No Erection problems?: No Penile pain?: No  Gastrointestinal Nausea?: No Vomiting?: No Indigestion/heartburn?: No Diarrhea?: No Constipation?: No  Constitutional Fever: No Night sweats?: No Weight loss?: No Fatigue?: No  Skin Skin rash/lesions?: No Itching?: No  Eyes Blurred vision?: No Double vision?: No  Ears/Nose/Throat Sore throat?: No Sinus problems?:  No  Hematologic/Lymphatic Swollen glands?: No Easy bruising?: No  Cardiovascular Leg swelling?: No Chest pain?: No  Respiratory Cough?: No Shortness of breath?: Yes  Endocrine Excessive thirst?: No  Musculoskeletal Back pain?: Yes Joint pain?: Yes  Neurological Headaches?: No Dizziness?: No  Psychologic Depression?: No Anxiety?: No  Physical Exam: BP 138/64   Pulse 66   Ht 5\' 10"  (1.778 m)   Wt 129.5 kg   BMI 40.95 kg/m   Constitutional:  Alert and oriented, No acute distress. HEENT: [place] AT, moist mucus membranes.  Trachea midline, no masses. Cardiovascular: No clubbing, cyanosis, or edema. Respiratory: Normal respiratory effort, no increased work of breathing. Mitchell: Abdomen is soft, nontender, nondistended, no abdominal masses GU: No CVA tenderness Lymph: No cervical or inguinal lymphadenopathy. Skin: No rashes, bruises or suspicious lesions. Neurologic: Grossly intact, no focal deficits, moving all 4 extremities. Psychiatric: Normal mood and affect.  Laboratory Data: Lab Results  Component Value Date   WBC 5.5 02/18/2018   HGB 17.7 02/18/2018   HCT 51.9 (H) 02/18/2018   MCV 91 02/18/2018   PLT 135 (L) 02/18/2018    Lab Results   Component Value Date   CREATININE 1.02 01/13/2018    No results found for: PSA  Lab Results  Component Value Date   TESTOSTERONE 740 02/18/2018    Lab Results  Component Value Date   HGBA1C 6.3 (A) 01/17/2018    Urinalysis    Component Value Date/Time   APPEARANCEUR Clear 08/12/2017 1102   GLUCOSEU Negative 08/12/2017 1102   BILIRUBINUR Negative 08/12/2017 1102   PROTEINUR Negative 08/12/2017 1102   NITRITE Negative 08/12/2017 1102   LEUKOCYTESUR Negative 08/12/2017 1102    No results found for: LABMICR, WBCUA, RBCUA, LABEPIT, MUCUS, BACTERIA  Results for orders placed during the hospital encounter of 12/28/15  DG Abd 1 View   Narrative CLINICAL DATA:  Nasogastric tube placement  EXAM: ABDOMEN - 1 VIEW  COMPARISON:  CT abdomen and pelvis December 28, 2015  FINDINGS: Nasogastric tube tip and side port are in the stomach. Stomach is distended with air. There are multiple loops of mildly dilated bowel. No free air evident. Lung bases are clear.  IMPRESSION: Nasogastric tube tip and side port in stomach. Stomach is distended with air. Loops of dilated small bowel persist. No free air. Lung bases clear.   Electronically Signed   By: Lowella Grip III M.D.   On: 12/28/2015 19:50    No results found for this or any previous visit. No results found for this or any previous visit. No results found for this or any previous visit. No results found for this or any previous visit. No results found for this or any previous visit. No results found for this or any previous visit. No results found for this or any previous visit.  Assessment & Plan:    Low testosterone - his PSA was up 0.3 in 6 months. Pt told he had mild BPH. Discussed Hct and encouraged him to give blood and stay hydrated with water (not margaritas). Will check labs and PSA in 6 mo.     No follow-ups on file.  Festus Aloe, MD  [facility] [address]  [place], [place] [address] [phone]

## 2018-03-25 ENCOUNTER — Telehealth: Payer: Self-pay | Admitting: Family Medicine

## 2018-03-25 NOTE — Telephone Encounter (Signed)
Copied from [place] [id number]. Topic: Inquiry >> Mar 25, 2018  9:38 AM Blase Mess A wrote: CRM for notification. See Telephone encounter for: 03/25/18. Patient is requesting his blood type.  Please advise. Call back number [phone]

## 2018-03-25 NOTE — Telephone Encounter (Signed)
I spoke with pt he thought his blood type would have been routinely done. I advised not a routine test. Pt said not to worry about it, he is scheduled to give blood today and will find out then. Nothing further needed.

## 2018-04-04 ENCOUNTER — Encounter: Payer: Self-pay | Admitting: Family Medicine

## 2018-04-04 ENCOUNTER — Ambulatory Visit (INDEPENDENT_AMBULATORY_CARE_PROVIDER_SITE_OTHER): Payer: PPO | Admitting: Family Medicine

## 2018-04-04 VITALS — BP 120/64 | HR 59 | Temp 98.2°F | Ht 70.0 in | Wt 286.2 lb

## 2018-04-04 DIAGNOSIS — T148XXA Other injury of unspecified body region, initial encounter: Secondary | ICD-10-CM | POA: Diagnosis not present

## 2018-04-04 NOTE — Assessment & Plan Note (Signed)
Unclear etiology at this time, seems benign but poorly healing skin scab. Will try duoderm artificial skin x 1 wk. If continues to heal poorly, will refer to derm for f/u. Pt agrees with plan.

## 2018-04-04 NOTE — Patient Instructions (Signed)
Looks like poorly healing scab - try artificial skin for a week. If no improvement, let us know for dermatology referral.

## 2018-04-04 NOTE — Progress Notes (Signed)
BP 120/64 (BP Location: Left Arm, Patient Position: Sitting, Cuff Size: Large)   Pulse (!) 59   Temp 98.2 F (36.8 C) (Oral)   Ht 5\' 10"  (1.778 m)   Wt 286 lb 4 oz (129.8 kg)   SpO2 98%   BMI 41.07 kg/m    CC: back cyst Subjective:    Patient ID: Aaron Mitchell, male    DOB: 1946/05/14, 72 y.o.   MRN: [id number]  HPI: Josep Luviano is a 72 y.o. male presenting on 04/04/2018 for Cyst (C/o bump located on back for several wks and is not healing. )   Several week h/o poorly healing scab left lower back. Has not been picking or scratching area that he knows of. No redness warmth or fever/chills or drainage. Unsure how long it's been there.   Relevant past medical, surgical, family and social history reviewed and updated as indicated. Interim medical history since our last visit reviewed. Allergies and medications reviewed and updated. Outpatient Medications Prior to Visit  Medication Sig Dispense Refill  . albuterol (PROVENTIL HFA;VENTOLIN HFA) 108 (90 Base) MCG/ACT inhaler Inhale 2 puffs into the lungs every 6 (six) hours as needed for wheezing or shortness of breath. 3 Inhaler 3  . albuterol (PROVENTIL) (2.5 MG/3ML) 0.083% nebulizer solution Take 3 mLs (2.5 mg total) by nebulization every 6 (six) hours as needed for wheezing or shortness of breath. 90 mL 3  . allopurinol (ZYLOPRIM) 300 MG tablet TAKE 1 TABLET(300 MG) BY MOUTH DAILY 90 tablet 3  . amLODipine (NORVASC) 5 MG tablet TAKE 1 TABLET(5 MG) BY MOUTH TWICE DAILY 180 tablet 2  . atorvastatin (LIPITOR) 40 MG tablet Take 1 tablet (40 mg total) by mouth daily. 90 tablet 3  . diclofenac (VOLTAREN) 75 MG EC tablet TAKE 1 TABLET BY MOUTH TWICE DAILY AS NEEDED. CAUTION: LONG-TERM USE CAN CAUSE KIDNEY PROBLEMS, RISK OF HEART ATTACK 60 tablet 6  . diclofenac sodium (VOLTAREN) 1 % GEL Apply 2 g topically daily as needed. Apply to both hands.    . fluticasone (FLONASE) 50 MCG/ACT nasal spray SPRAY TWICE IN EACH NOSTRIL EVERY DAY 16 g 5  .  HYDROcodone-homatropine (HYCODAN) 5-1.5 MG/5ML syrup Take 5 mLs by mouth 2 (two) times daily as needed for cough (sedation precautions). 140 mL 0  . levocetirizine (XYZAL) 5 MG tablet TAKE 1 TABLET(5 MG) BY MOUTH EVERY EVENING 90 tablet 2  . lisinopril-hydrochlorothiazide (PRINZIDE,ZESTORETIC) 20-12.5 MG tablet TAKE 1 TABLET BY MOUTH DAILY 90 tablet 3  . Magnesium 500 MG TABS Take 0.5-1 tablets (250-500 mg total) by mouth every Monday, Wednesday, and Friday. 30 tablet   . metaxalone (SKELAXIN) 800 MG tablet Take 1 tablet (800 mg total) by mouth 3 (three) times daily as needed for muscle spasms. 270 tablet 0  . Multiple Vitamins tablet Take 1 tablet by mouth daily.    . naproxen sodium (ANAPROX) 220 MG tablet Take 440 mg by mouth daily.     . nitroGLYCERIN (NITRODUR - DOSED IN MG/24 HR) 0.2 mg/hr patch USE 1/2 PATCH DAILY 30 patch 6  . sildenafil (VIAGRA) 100 MG tablet Take by mouth.    . tadalafil (CIALIS) 5 MG tablet Take by mouth.    . testosterone cypionate (DEPOTESTOSTERONE CYPIONATE) 200 MG/ML injection Inject 0.5 mLs (100 mg total) into the muscle once a week. 10 mL 0  . Tiotropium Bromide Monohydrate (SPIRIVA RESPIMAT) 2.5 MCG/ACT AERS Inhale 2 puffs into the lungs daily. 4 g 11  . traMADol (ULTRAM) 50 MG tablet TAKE  1 TABLET BY MOUTH EVERY 8 HOURS AS NEEDED FOR MODERATE PAIN 30 tablet 0  . zolpidem (AMBIEN) 10 MG tablet Take 1 tablet (10 mg total) by mouth at bedtime as needed. For sleep. 30 tablet 0  . aspirin EC 81 MG tablet Take 81 mg by mouth daily.     No facility-administered medications prior to visit.      Per HPI unless specifically indicated in ROS section below Review of Systems     Objective:    BP 120/64 (BP Location: Left Arm, Patient Position: Sitting, Cuff Size: Large)   Pulse (!) 59   Temp 98.2 F (36.8 C) (Oral)   Ht 5\' 10"  (1.778 m)   Wt 286 lb 4 oz (129.8 kg)   SpO2 98%   BMI 41.07 kg/m   Wt Readings from Last 3 Encounters:  04/04/18 286 lb 4 oz (129.8  kg)  03/03/18 285 lb 6.4 oz (129.5 kg)  01/17/18 288 lb 12 oz (131 kg)    Physical Exam  Constitutional: He appears well-developed and well-nourished. No distress.  Skin: Skin is warm and dry. Lesion noted.     Scab present at left lower back with mild surrounding dry skin but no surrounding induration, erythema or drainage  Nursing note and vitals reviewed.     Assessment & Plan:   Problem List Items Addressed This Visit    Wound of skin - Primary    Unclear etiology at this time, seems benign but poorly healing skin scab. Will try duoderm artificial skin x 1 wk. If continues to heal poorly, will refer to derm for f/u. Pt agrees with plan.           No orders of the defined types were placed in this encounter.  No orders of the defined types were placed in this encounter.   Follow up plan: Return if symptoms worsen or fail to improve.  [name], MD

## 2018-04-10 NOTE — Progress Notes (Signed)
   Dr. Frederico Hamman T. Dura Mccormack, MD, [facility] [place], [address] Phone: [phone] Fax: [phone]  04/11/2018  Patient: Aaron Mitchell, MRN: [id number], DOB: 08-May-1946, 72 y.o.  Primary Physician:  [name], MD   Chief Complaint  Patient presents with  . Hip Pain    both   Subjective:   [name] is a 72 y.o. very pleasant male patient who presents with the following:  Patient with chronic back pain and history of GTB B presents for pain f/u with recurrent B GTB.  B hip injections - GTB  I am going to repeat his GTB injections today only.  Trochanteric Bursitis Injection, R Verbal consent obtained. Risks (including infection, potential atrophy), benefits, and alternatives reviewed. Greater trochanter sterilely prepped with Chloraprep. Ethyl Chloride used for anesthesia. 8 cc of Lidocaine 1% injected with 2 mL of Depo-Medrol 40 mg into trochanteric bursa at area of maximal tenderness at greater trochanter. Needle taken to bone to troch bursa, flows easily. Bursa massaged. No bleeding and no complications. Decreased pain after injection. Needle: 22 gauge spinal needle   Trochanteric Bursitis Injection, L Verbal consent obtained. Risks (including infection, potential atrophy), benefits, and alternatives reviewed. Greater trochanter sterilely prepped with Chloraprep. Ethyl Chloride used for anesthesia. 8 cc of Lidocaine 1% injected with 2 mL of Depo-Medrol 40 mg into trochanteric bursa at area of maximal tenderness at greater trochanter. Needle taken to bone to troch bursa, flows easily. Bursa massaged. No bleeding and no complications. Decreased pain after injection. Needle: 22 gauge spinal needle   Signed,  Iantha Titsworth T. Sandeep Radell, MD

## 2018-04-11 ENCOUNTER — Ambulatory Visit (INDEPENDENT_AMBULATORY_CARE_PROVIDER_SITE_OTHER): Payer: PPO | Admitting: Family Medicine

## 2018-04-11 ENCOUNTER — Encounter: Payer: Self-pay | Admitting: Family Medicine

## 2018-04-11 VITALS — BP 120/78 | HR 57 | Temp 97.8°F | Ht 70.0 in | Wt 290.2 lb

## 2018-04-11 DIAGNOSIS — M7061 Trochanteric bursitis, right hip: Secondary | ICD-10-CM | POA: Diagnosis not present

## 2018-04-11 DIAGNOSIS — M7062 Trochanteric bursitis, left hip: Secondary | ICD-10-CM | POA: Diagnosis not present

## 2018-04-11 IMAGING — DX DG HIP (WITH OR WITHOUT PELVIS) 5+V BILAT
5 series · 5 of 5 positions shown · non-contrast
Comparison: None.

CLINICAL DATA: Bilateral hip pain.

EXAM:
DG HIP (WITH OR WITHOUT PELVIS) 5+V BILAT

[pelvis ap]
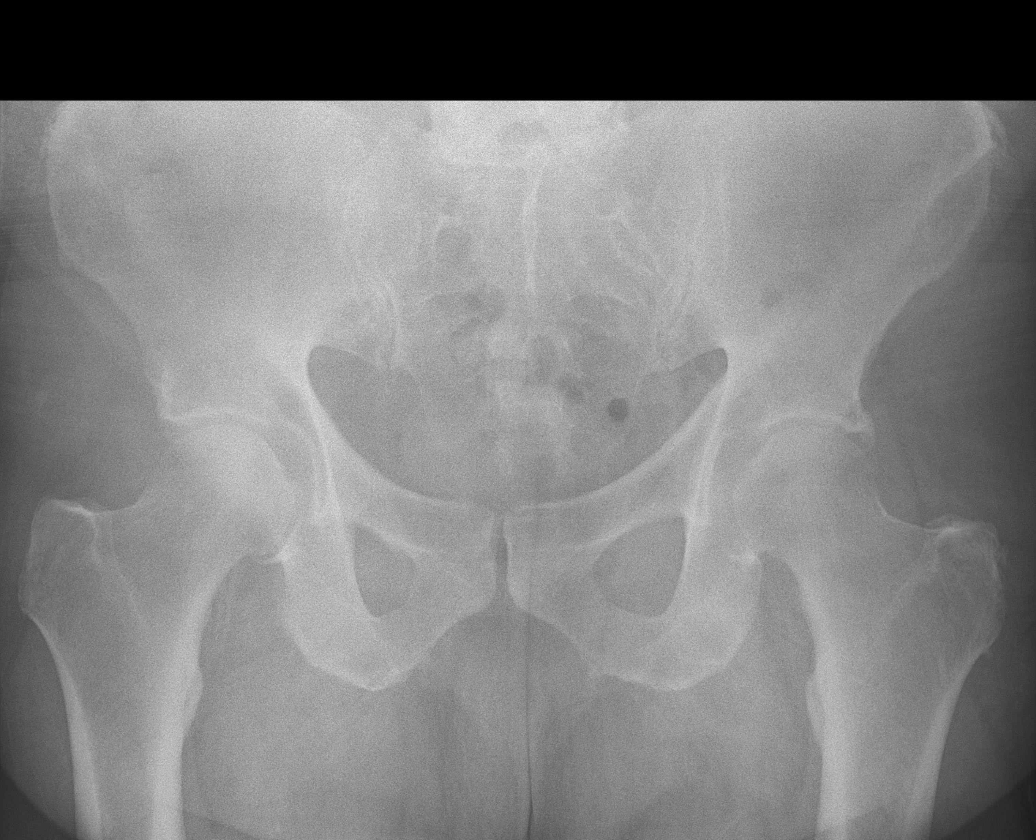

[hip ap (1 of 2)]
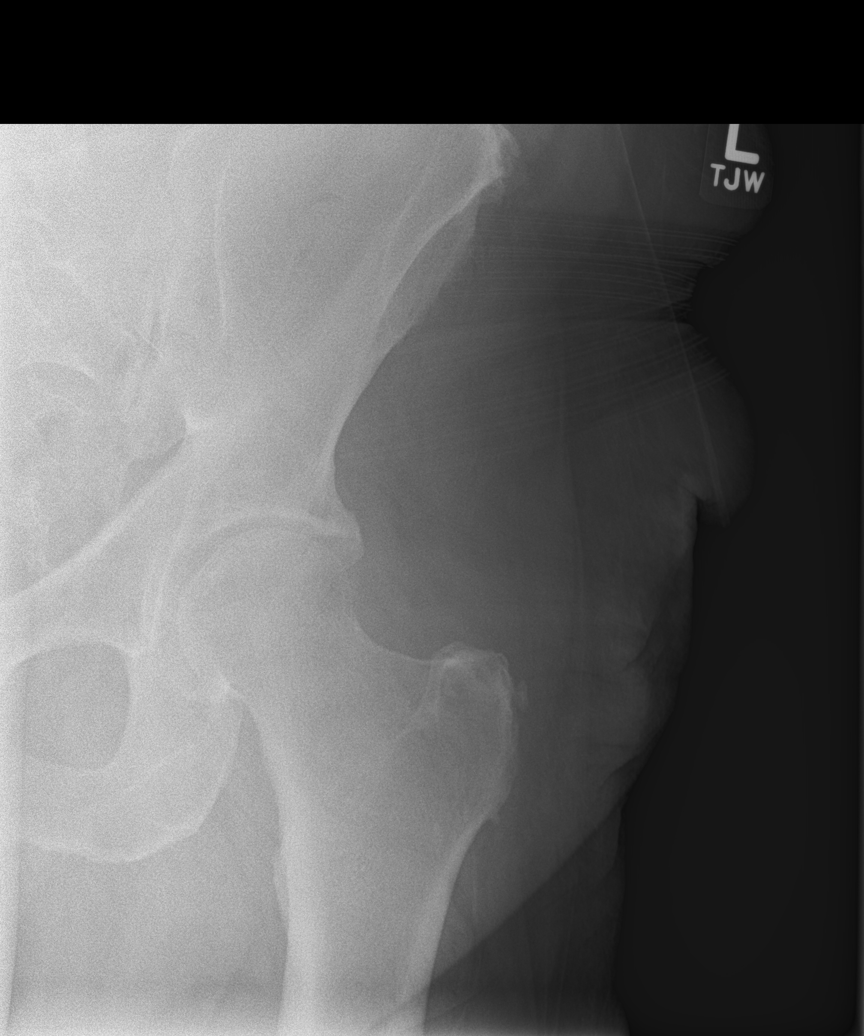

[hip lat (1 of 2)]
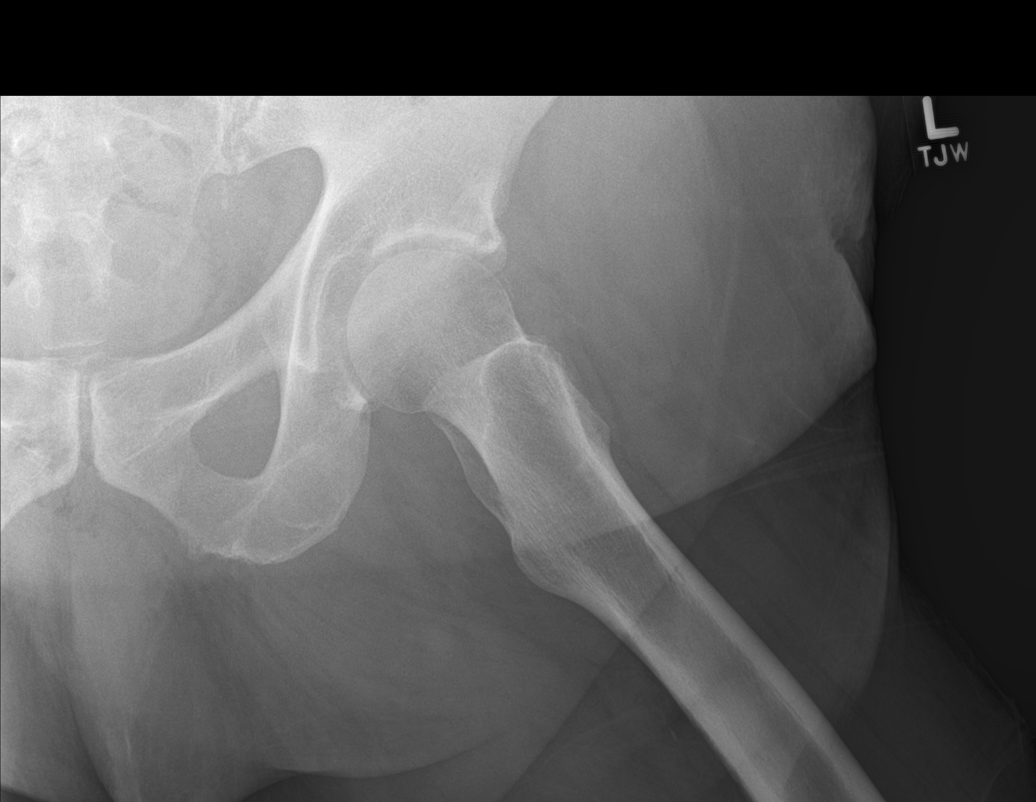

[hip ap (2 of 2)]
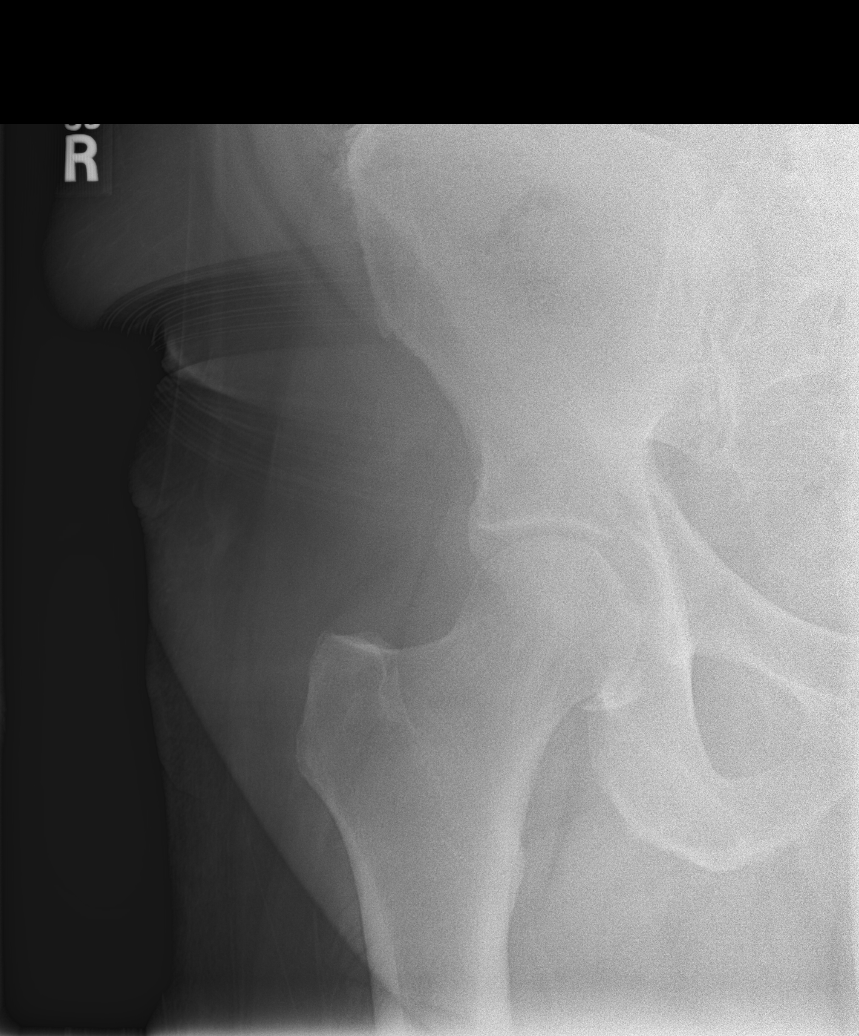

[hip lat (2 of 2)]
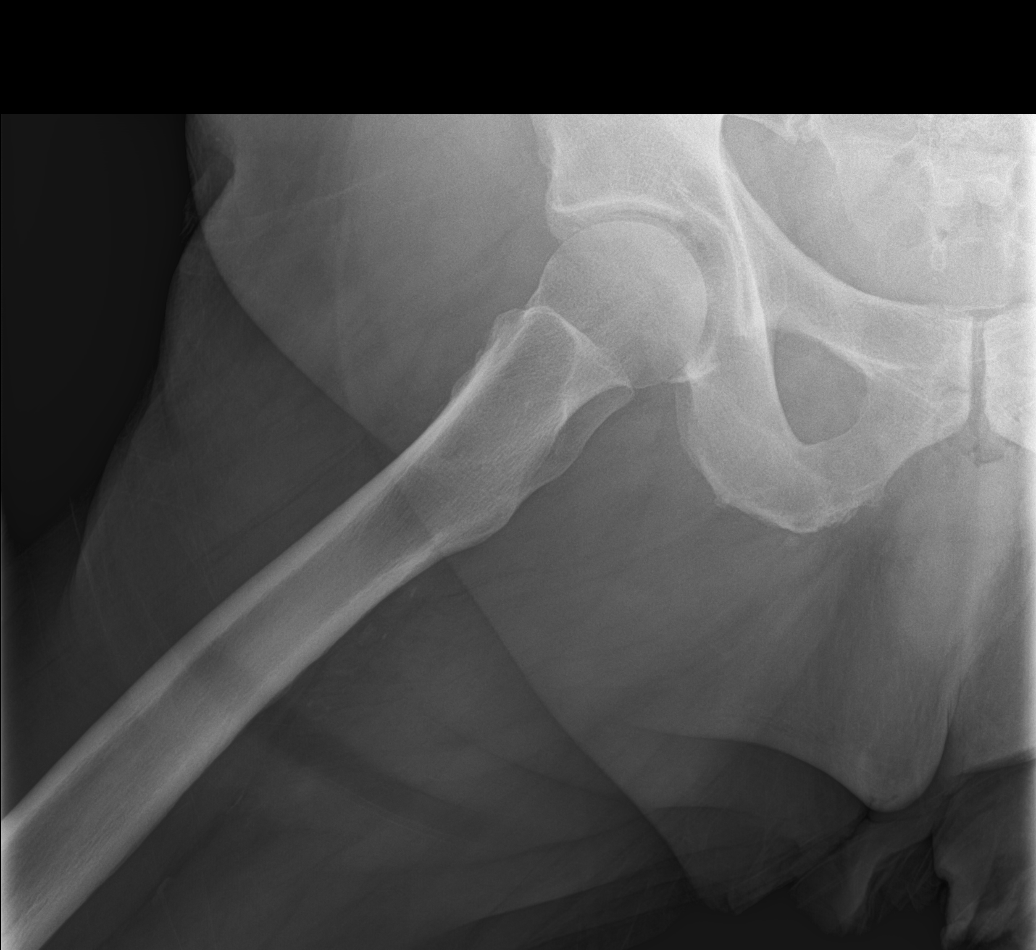

[5 of 5 positions shown; findings below may reference images not displayed]

FINDINGS: There is no evidence of hip fracture or dislocation. There are
degenerative joint changes of bilateral hips with narrowed joint
space and osteophyte formation.
IMPRESSION: No acute fracture or dislocation. Mild degenerative joint changes of
bilateral hips.

## 2018-04-11 MED ORDER — METHYLPREDNISOLONE ACETATE 40 MG/ML IJ SUSP
80.0000 mg | Freq: Once | INTRAMUSCULAR | Status: AC
  Administered 2018-04-11: 80 mg via INTRA_ARTICULAR

## 2018-04-11 NOTE — Addendum Note (Signed)
Addended by: Emelia Salisbury C on: 04/11/2018 10:05 AM   Modules accepted: Orders

## 2018-04-25 ENCOUNTER — Telehealth: Payer: Self-pay | Admitting: Family Medicine

## 2018-04-25 DIAGNOSIS — Z0279 Encounter for issue of other medical certificate: Secondary | ICD-10-CM

## 2018-04-25 NOTE — Telephone Encounter (Signed)
Patient dropped off parking placard form. Placed in Rx tower

## 2018-04-25 NOTE — Telephone Encounter (Signed)
Filled and in Lisa's box 

## 2018-04-25 NOTE — Telephone Encounter (Signed)
Placed form in Dr. G's box.  

## 2018-04-25 NOTE — Telephone Encounter (Signed)
Refill denied. Pt has completed course of treatment.  

## 2018-04-26 NOTE — Telephone Encounter (Signed)
Spoke with pt notifying him the form is ready to pick up. Also, there is a $5.00 fee to have form completed.  [Placed form at front office.  Made copy to be scanned and 1 for billing.]

## 2018-04-27 NOTE — Telephone Encounter (Signed)
Patient states " I did not know I have completed the course of treatment, or did I know I had one " he is still having heel pain. Please advise. He can be reached at [phone].

## 2018-05-02 NOTE — Progress Notes (Signed)
[name] [address] [place], [place] [address] Phone: [phone] Subjective:    I Aaron Mitchell am serving as a Education administrator for Dr. Hulan Saas.   CC: Left ankle pain  [id number]  Aaron Mitchell is a 72 y.o. male coming in with complaint of left foot pain. States his foot is still painful.  Has not had Achilles tendinitis.  Has been doing well with Achilles tendinitis.  Patient states continue the heel lift.  Continue the nitroglycerin.  Mild discomfort all the time but never without it.  Patient states that the severe pain is improved     Past Medical History:  Diagnosis Date  . Achilles tendinitis    Left leg  . Borderline diabetes mellitus   . Childhood asthma   . Degenerative disc disease, lumbar    rec against surgery  . Erectile dysfunction   . GI bleed 12/2015   hospitalization  . Gout   . Hypertension   . Ileus ([place]) 12/2015   hospitalization   . Male hypogonadism    followed by urology on T injections ([place])  . Seasonal allergies    pollen  . Severe obesity (BMI >= 40) ([facility])   . Shoulder bursitis   . Sleep apnea    CPAP   Past Surgical History:  Procedure Laterality Date  . ADENOIDECTOMY    . CARPAL TUNNEL RELEASE Right 2015  . CATARACT EXTRACTION W/PHACO Right 07/15/2016   Procedure: CATARACT EXTRACTION PHACO AND INTRAOCULAR LENS PLACEMENT (IOC);  Surgeon: Leandrew Koyanagi, MD;  Location: [facility];  Service: Ophthalmology;  Laterality: Right;  TORIC sleep apnea  . CATARACT EXTRACTION W/PHACO Left 08/05/2016   Procedure: CATARACT EXTRACTION PHACO AND INTRAOCULAR LENS PLACEMENT (IOC) toric;  Surgeon: Leandrew Koyanagi, MD;  Location: [facility];  Service: Ophthalmology;  Laterality: Left;  left sleep apnea toric  . COLONOSCOPY  10/2012   rpt 5 yrs ([facility])  . EYE SURGERY     Lens replacement 07/12/2016,08/05/2016  . LUMBAR EPIDURAL INJECTION Bilateral 04/2017, 12/2017   S1 transforaminal  ESI ([place])  . RHINOPLASTY    . TONSILLECTOMY  ?1954  . TRIGGER FINGER RELEASE Right 2015   4th   Social History   Socioeconomic History  . Marital status: Married    Spouse name: Not on file  . Number of children: Not on file  . Years of education: Not on file  . Highest education level: Not on file  Occupational History  . Not on file  Social Needs  . Financial resource strain: Not on file  . Food insecurity:    Worry: Not on file    Inability: Not on file  . Transportation needs:    Medical: Not on file    Non-medical: Not on file  Tobacco Use  . Smoking status: Former Smoker    Last attempt to quit: 07/27/1978    Years since quitting: 39.7  . Smokeless tobacco: Never Used  . Tobacco comment: Quit in 1980  Substance and Sexual Activity  . Alcohol use: Yes    Alcohol/week: 5.0 standard drinks    Types: 5 Shots of liquor per week    Comment:    . Drug use: No  . Sexual activity: Not on file  Lifestyle  . Physical activity:    Days per week: Not on file    Minutes per session: Not on file  . Stress: Not on file  Relationships  . Social connections:  Talks on phone: Not on file    Gets together: Not on file    Attends religious service: Not on file    Active member of club or organization: Not on file    Attends meetings of clubs or organizations: Not on file    Relationship status: Not on file  Other Topics Concern  . Not on file  Social History Narrative   Lives at home with wife [name]), dog passed away November 11, 2015   1 grown child   Occ: retired Clinical biochemist, was Chief Technology Officer    Edu: 2 yrs college   Activity: no regular exercise   Diet: good water, fruits daily, splenda iced tea   Allergies  Allergen Reactions  . Codeine Other (See Comments)    Other reaction(s): Other (See Comments) HEADACHES Causes headaches when he stops liquid Codeine-based products   Family History  Problem Relation Age of Onset  . Alcohol abuse Father   . Hypertension  Father   . AVM Father 68       hemorrhagic stroke  . Diabetes Paternal Grandmother   . Cancer Neg Hx   . CAD Neg Hx   . Prostate cancer Neg Hx   . Bladder Cancer Neg Hx   . Kidney cancer Neg Hx     Current Outpatient Medications (Endocrine & Metabolic):  .  testosterone cypionate (DEPOTESTOSTERONE CYPIONATE) 200 MG/ML injection, Inject 0.5 mLs (100 mg total) into the muscle once a week.  Current Outpatient Medications (Cardiovascular):  .  amLODipine (NORVASC) 5 MG tablet, TAKE 1 TABLET(5 MG) BY MOUTH TWICE DAILY .  atorvastatin (LIPITOR) 40 MG tablet, Take 1 tablet (40 mg total) by mouth daily. [name] Kitchen  lisinopril-hydrochlorothiazide (PRINZIDE,ZESTORETIC) 20-12.5 MG tablet, TAKE 1 TABLET BY MOUTH DAILY .  nitroGLYCERIN (NITRODUR - DOSED IN MG/24 HR) 0.2 mg/hr patch, USE 1/2 PATCH DAILY .  sildenafil (VIAGRA) 100 MG tablet, Take by mouth. .  tadalafil (CIALIS) 5 MG tablet, Take by mouth.  Current Outpatient Medications (Respiratory):  .  albuterol (PROVENTIL HFA;VENTOLIN HFA) 108 (90 Base) MCG/ACT inhaler, Inhale 2 puffs into the lungs every 6 (six) hours as needed for wheezing or shortness of breath. [name] Kitchen  albuterol (PROVENTIL) (2.5 MG/3ML) 0.083% nebulizer solution, Take 3 mLs (2.5 mg total) by nebulization every 6 (six) hours as needed for wheezing or shortness of breath. .  fluticasone (FLONASE) 50 MCG/ACT nasal spray, SPRAY TWICE IN EACH NOSTRIL EVERY DAY .  HYDROcodone-homatropine (HYCODAN) 5-1.5 MG/5ML syrup, Take 5 mLs by mouth 2 (two) times daily as needed for cough (sedation precautions). [name] Kitchen  levocetirizine (XYZAL) 5 MG tablet, TAKE 1 TABLET(5 MG) BY MOUTH EVERY EVENING .  Tiotropium Bromide Monohydrate (SPIRIVA RESPIMAT) 2.5 MCG/ACT AERS, Inhale 2 puffs into the lungs daily.  Current Outpatient Medications (Analgesics):  .  allopurinol (ZYLOPRIM) 300 MG tablet, TAKE 1 TABLET(300 MG) BY MOUTH DAILY .  diclofenac (VOLTAREN) 75 MG EC tablet, TAKE 1 TABLET BY MOUTH TWICE DAILY AS NEEDED.  CAUTION: LONG-TERM USE CAN CAUSE KIDNEY PROBLEMS, RISK OF HEART ATTACK .  naproxen sodium (ANAPROX) 220 MG tablet, Take 440 mg by mouth daily.  .  traMADol (ULTRAM) 50 MG tablet, TAKE 1 TABLET BY MOUTH EVERY 8 HOURS AS NEEDED FOR MODERATE PAIN   Current Outpatient Medications (Other):  .  diclofenac sodium (VOLTAREN) 1 % GEL, Apply 2 g topically daily as needed. Apply to both hands. .  Magnesium 500 MG TABS, Take 0.5-1 tablets (250-500 mg total) by mouth every Monday, 11-11-2022, and Friday. [name] Kitchen  metaxalone (SKELAXIN) 800 MG tablet, Take 1 tablet (800 mg total) by mouth 3 (three) times daily as needed for muscle spasms. .  Multiple Vitamins tablet, Take 1 tablet by mouth daily. [name] Kitchen  zolpidem (AMBIEN) 10 MG tablet, Take 1 tablet (10 mg total) by mouth at bedtime as needed. For sleep.    Past medical history, social, surgical and family history all reviewed in electronic medical record.  No pertanent information unless stated regarding to the chief complaint.   Review of Systems:  No headache, visual changes, nausea, vomiting, diarrhea, constipation, dizziness, abdominal pain, skin rash, fevers, chills, night sweats, weight loss, swollen lymph nodes, body aches, joint swelling, chest pain, shortness of breath, mood changes.  Positive muscle aches  Objective  Blood pressure 118/72, pulse 60, height 5\' 10"  (1.778 m), weight 290 lb (131.5 kg), SpO2 98 %.    General: No apparent distress alert and oriented x3 mood and affect normal, dressed appropriately.  HEENT: Pupils equal, extraocular movements intact  Respiratory: Patient's speak in full sentences and does not appear short of breath  Cardiovascular: No lower extremity edema, non tender, no erythema  Skin: Warm dry intact with no signs of infection or rash on extremities or on axial skeleton.  Abdomen: Soft nontender morbidly obese Neuro: Cranial nerves II through XII are intact, neurovascularly intact in all extremities with 2+ DTRs and 2+  pulses.  Lymph: No lymphadenopathy of posterior or anterior cervical chain or axillae bilaterally.  Gait normal with good balance and coordination.  MSK:  Non tender with full range of motion and good stability and symmetric strength and tone of shoulders, elbows, wrist, hip, knee bilaterally.   Left ankle shows that patient does have trace effusion.  Patient does have mild limited range of motion of less than 30 degrees in all planes.  Patient does have very mild tenderness at the insertion of the Achilles but significantly improved from previous exam.  Full strength.  Neurovascular intact distally.   Impression and Recommendations:     This case required medical decision making of moderate complexity. The above documentation has been reviewed and is accurate and complete Lyndal Pulley, DO       Note: This dictation was prepared with Dragon dictation along with smaller phrase technology. Any transcriptional errors that result from this process are unintentional.

## 2018-05-04 ENCOUNTER — Encounter: Payer: Self-pay | Admitting: Family Medicine

## 2018-05-04 ENCOUNTER — Ambulatory Visit: Payer: PPO | Admitting: Family Medicine

## 2018-05-04 DIAGNOSIS — N529 Male erectile dysfunction, unspecified: Secondary | ICD-10-CM

## 2018-05-04 DIAGNOSIS — G4733 Obstructive sleep apnea (adult) (pediatric): Secondary | ICD-10-CM | POA: Diagnosis not present

## 2018-05-04 DIAGNOSIS — M7662 Achilles tendinitis, left leg: Secondary | ICD-10-CM

## 2018-05-04 MED ORDER — NITROGLYCERIN 0.2 MG/HR TD PT24: MEDICATED_PATCH | TRANSDERMAL | 6 refills | Status: DC

## 2018-05-04 NOTE — Assessment & Plan Note (Signed)
Discussed with patient again that I do not feel comfortable refilling the nitroglycerin if he is going to continue with the erectile dysfunction.  Patient is going to always have 48 hours in between to see how patient responds.  Patient has agreement with plan and will follow-up again as needed

## 2018-05-04 NOTE — Assessment & Plan Note (Signed)
Seems to be little improved.  Discussed icing regimen refill nitroglycerin again.  Warned of potential side effects.  Patient does have a prescription for erectile dysfunction medications and we discussed that we cannot do both at the same time.  Patient understands that is absolute contraindication.  Patient knows he would need to stop the nitroglycerin patches for at least 48 hours before he would use erectile dysfunction medications.  Patient verbalized understanding.

## 2018-05-04 NOTE — Patient Instructions (Addendum)
Good to see you  Ice 2 times daily  Continue what you are doing DO NOT USE THE ED PILLS WITH THE NITRO  See me again in 1 year

## 2018-05-05 ENCOUNTER — Other Ambulatory Visit: Payer: Self-pay | Admitting: *Deleted

## 2018-05-05 MED ORDER — NITROGLYCERIN 0.2 MG/HR TD PT24: MEDICATED_PATCH | TRANSDERMAL | 1 refills | Status: DC

## 2018-05-10 ENCOUNTER — Encounter: Payer: Self-pay | Admitting: Family Medicine

## 2018-05-10 NOTE — Telephone Encounter (Signed)
Will you reschedule pt's wellness sometime after 07/18/18?

## 2018-05-27 DIAGNOSIS — M5126 Other intervertebral disc displacement, lumbar region: Secondary | ICD-10-CM | POA: Diagnosis not present

## 2018-05-27 DIAGNOSIS — M5416 Radiculopathy, lumbar region: Secondary | ICD-10-CM | POA: Diagnosis not present

## 2018-05-27 DIAGNOSIS — M48062 Spinal stenosis, lumbar region with neurogenic claudication: Secondary | ICD-10-CM | POA: Diagnosis not present

## 2018-06-07 ENCOUNTER — Encounter: Payer: Self-pay | Admitting: Family Medicine

## 2018-06-07 ENCOUNTER — Ambulatory Visit (INDEPENDENT_AMBULATORY_CARE_PROVIDER_SITE_OTHER): Payer: PPO

## 2018-06-07 ENCOUNTER — Ambulatory Visit (INDEPENDENT_AMBULATORY_CARE_PROVIDER_SITE_OTHER): Payer: PPO | Admitting: Family Medicine

## 2018-06-07 VITALS — BP 130/82 | HR 59 | Ht 70.0 in

## 2018-06-07 DIAGNOSIS — M25551 Pain in right hip: Secondary | ICD-10-CM | POA: Diagnosis not present

## 2018-06-07 DIAGNOSIS — M25552 Pain in left hip: Secondary | ICD-10-CM | POA: Diagnosis not present

## 2018-06-07 NOTE — Progress Notes (Signed)
Alfonza Toft - 72 y.o. male MRN [id number]  Date of birth: Nov 10, 1945  SUBJECTIVE:  Including CC & ROS.  Chief Complaint  Patient presents with  . Hip Pain    bilateral GT injections    Martice Doty is a 71 y.o. male that is is presenting with left and right hip pain.  The pain is acute on chronic in nature.  Is occurring over the lateral aspect of each hip.  It is localized to this area.  Denies any mechanism of injury.  The pain is moderate in severity.  Denies any radicular symptoms.  He has received injections in the past which helped improve his pain.  He denies any improvement with topical medications.  His last injection was in 9/16.   Review of Systems  Constitutional: Negative for fever.  HENT: Negative for congestion.   Respiratory: Negative for cough.   Cardiovascular: Negative for chest pain.  Gastrointestinal: Negative for abdominal pain.  Musculoskeletal: Positive for back pain.  Skin: Negative for color change.  Neurological: Negative for weakness.  Hematological: Negative for adenopathy.  Psychiatric/Behavioral: Negative for agitation.    HISTORY: Past Medical, Surgical, Social, and Family History Reviewed & Updated per EMR.   Pertinent Historical Findings include:  Past Medical History:  Diagnosis Date  . Achilles tendinitis    Left leg  . Borderline diabetes mellitus   . Childhood asthma   . Degenerative disc disease, lumbar    rec against surgery  . Erectile dysfunction   . GI bleed 12/2015   hospitalization  . Gout   . Hypertension   . Ileus ([place]) 12/2015   hospitalization   . Male hypogonadism    followed by urology on T injections ([place])  . Seasonal allergies    pollen  . Severe obesity (BMI >= 40) ([facility])   . Shoulder bursitis   . Sleep apnea    CPAP    Past Surgical History:  Procedure Laterality Date  . ADENOIDECTOMY    . CARPAL TUNNEL RELEASE Right 2015  . CATARACT EXTRACTION W/PHACO Right 07/15/2016   Procedure: CATARACT  EXTRACTION PHACO AND INTRAOCULAR LENS PLACEMENT (IOC);  Surgeon: Leandrew Koyanagi, MD;  Location: [place];  Service: Ophthalmology;  Laterality: Right;  TORIC sleep apnea  . CATARACT EXTRACTION W/PHACO Left 08/05/2016   Procedure: CATARACT EXTRACTION PHACO AND INTRAOCULAR LENS PLACEMENT (IOC) toric;  Surgeon: Leandrew Koyanagi, MD;  Location: [place];  Service: Ophthalmology;  Laterality: Left;  left sleep apnea toric  . COLONOSCOPY  10/2012   rpt 5 yrs ([facility])  . EYE SURGERY     Lens replacement 07/12/2016,08/05/2016  . LUMBAR EPIDURAL INJECTION Bilateral 04/2017, 12/2017   S1 transforaminal ESI ([place])  . RHINOPLASTY    . TONSILLECTOMY  ?1954  . TRIGGER FINGER RELEASE Right 2015   4th    Allergies  Allergen Reactions  . Codeine Other (See Comments)    Other reaction(s): Other (See Comments) HEADACHES Causes headaches when he stops liquid Codeine-based products    Family History  Problem Relation Age of Onset  . Alcohol abuse Father   . Hypertension Father   . AVM Father 82       hemorrhagic stroke  . Diabetes Paternal Grandmother   . Cancer Neg Hx   . CAD Neg Hx   . Prostate cancer Neg Hx   . Bladder Cancer Neg Hx   . Kidney cancer Neg Hx      Social History   Socioeconomic History  . Marital  status: Married    Spouse name: Not on file  . Number of children: Not on file  . Years of education: Not on file  . Highest education level: Not on file  Occupational History  . Not on file  Social Needs  . Financial resource strain: Not on file  . Food insecurity:    Worry: Not on file    Inability: Not on file  . Transportation needs:    Medical: Not on file    Non-medical: Not on file  Tobacco Use  . Smoking status: Former Smoker    Last attempt to quit: 07/27/1978    Years since quitting: 39.8  . Smokeless tobacco: Never Used  . Tobacco comment: Quit in November 16, 1978  Substance and Sexual Activity  . Alcohol use: Yes     Alcohol/week: 5.0 standard drinks    Types: 5 Shots of liquor per week    Comment:    . Drug use: No  . Sexual activity: Not on file  Lifestyle  . Physical activity:    Days per week: Not on file    Minutes per session: Not on file  . Stress: Not on file  Relationships  . Social connections:    Talks on phone: Not on file    Gets together: Not on file    Attends religious service: Not on file    Active member of club or organization: Not on file    Attends meetings of clubs or organizations: Not on file    Relationship status: Not on file  . Intimate partner violence:    Fear of current or ex partner: Not on file    Emotionally abused: Not on file    Physically abused: Not on file    Forced sexual activity: Not on file  Other Topics Concern  . Not on file  Social History Narrative   Lives at home with wife [name]), dog passed away 11/16/2015   1 grown child   Occ: retired Clinical biochemist, was Chief Technology Officer    Edu: 2 yrs college   Activity: no regular exercise   Diet: good water, fruits daily, splenda iced tea     PHYSICAL EXAM:  VS: BP 130/82   Pulse (!) 59   Ht 5\' 10"  (1.778 m)   SpO2 97%   BMI 41.61 kg/m  Physical Exam Gen: NAD, alert, cooperative with exam, well-appearing ENT: normal lips, normal nasal mucosa,  Eye: normal EOM, normal conjunctiva and lids CV:  no edema, +2 pedal pulses   Resp: no accessory muscle use, non-labored,  Skin: no rashes, no areas of induration  Neuro: normal tone, normal sensation to touch Psych:  normal insight, alert and oriented MSK:  Left and right hip: Tenderness palpation over the greater trochanter. No tenderness palpation over the piriformis or SI joints. Normal internal and external rotation. Normal strength resistance with hip flexion, knee flexion and extension, plantarflexion and dorsiflexion. Negative straight leg raise bilaterally. Neurovascular intact.   Aspiration/Injection Procedure Note Shaan Rhoads 06-13-1946  Procedure: Injection Indications: Right hip pain  Procedure Details Consent: Risks of procedure as well as the alternatives and risks of each were explained to the (patient/caregiver).  Consent for procedure obtained. Time Out: Verified patient identification, verified procedure, site/side was marked, verified correct patient position, special equipment/implants available, medications/allergies/relevent history reviewed, required imaging and test results available.  Performed.  The area was cleaned with iodine and alcohol swabs.    The right greater trochanteric bursa was injected using  1 cc's of 40 mg Depomedrol and 4 cc's of 0.25% bupivacaine with a 22 3 1/2" needle.  Ultrasound was used. Images were obtained in Transverse and Long views showing the injection.    A sterile dressing was applied.  Patient did tolerate procedure well.   Aspiration/Injection Procedure Note Kastiel Simonian 08/28/1945  Procedure: Injection Indications: Left hip pain  Procedure Details Consent: Risks of procedure as well as the alternatives and risks of each were explained to the (patient/caregiver).  Consent for procedure obtained. Time Out: Verified patient identification, verified procedure, site/side was marked, verified correct patient position, special equipment/implants available, medications/allergies/relevent history reviewed, required imaging and test results available.  Performed.  The area was cleaned with iodine and alcohol swabs.    The left greater trochanteric bursa was injected using 1 cc's of 40 mg Depomedrol and 4 cc's of 0.25% bupivacaine with a 22 3 1/2" needle.  Ultrasound was used. Images were obtained in Transverse and Long views showing the injection.    A sterile dressing was applied.  Patient did tolerate procedure well.       ASSESSMENT & PLAN:   Bilateral hip pain Symptoms suggestive of GT bursitis. Does have history of sciatica.  - b/l GT  injections today  - counseled on HEP and supportive care

## 2018-06-07 NOTE — Patient Instructions (Signed)
Nice to meet you  Please try the exercises  Please try ice on the area  Please let us know if you would like to try physical therapy.

## 2018-06-09 ENCOUNTER — Other Ambulatory Visit: Payer: Self-pay | Admitting: Family Medicine

## 2018-06-09 DIAGNOSIS — M25552 Pain in left hip: Principal | ICD-10-CM

## 2018-06-09 DIAGNOSIS — M25551 Pain in right hip: Secondary | ICD-10-CM | POA: Insufficient documentation

## 2018-06-09 NOTE — Assessment & Plan Note (Signed)
Symptoms suggestive of GT bursitis. Does have history of sciatica.  - b/l GT injections today  - counseled on HEP and supportive care

## 2018-06-10 ENCOUNTER — Other Ambulatory Visit: Payer: Self-pay | Admitting: Family Medicine

## 2018-06-10 NOTE — Telephone Encounter (Signed)
Noted  

## 2018-06-10 NOTE — Telephone Encounter (Signed)
Spoke with [facility] to confirm rx was filled today. Says rx was not filled because pt told them he was taking 3 tabs daily and requested 90-day supply.  So the pharmacist closed the rx and told pt a new rx with those directions would need to be sent.   Spoke with pt to get clarification. Pt states he is taking rx as directed, BID but he does want a 90-day supply.  Informed him refill request will be sent to Dr. Darnell Level.  Pt verbalizes understanding.  Last OV:  06/07/18, acute with Dr. Raeford Razor at [facility] Next OV:  07/21/18, CPE

## 2018-06-10 NOTE — Telephone Encounter (Signed)
Pt calling to ck on refill for fluticasone; advised pt fluticasone filled on 06/09/18 to [facility] [address]. Pt voiced understanding.

## 2018-06-14 MED ORDER — FLUTICASONE PROPIONATE 50 MCG/ACT NA SUSP: 2.0000 | Freq: Every day | NASAL | 0 refills | Status: DC

## 2018-06-14 NOTE — Telephone Encounter (Signed)
Spoke to pt who requested 90d supply. New Rx sent

## 2018-06-14 NOTE — Addendum Note (Signed)
Addended by: Modena Nunnery on: 06/14/2018 03:36 PM   Modules accepted: Orders

## 2018-06-15 ENCOUNTER — Ambulatory Visit (INDEPENDENT_AMBULATORY_CARE_PROVIDER_SITE_OTHER): Payer: PPO | Admitting: Family Medicine

## 2018-06-15 ENCOUNTER — Encounter: Payer: Self-pay | Admitting: Family Medicine

## 2018-06-15 VITALS — BP 126/68 | HR 66 | Temp 97.8°F | Ht 70.0 in | Wt 286.8 lb

## 2018-06-15 DIAGNOSIS — R05 Cough: Secondary | ICD-10-CM

## 2018-06-15 DIAGNOSIS — R058 Other specified cough: Secondary | ICD-10-CM | POA: Insufficient documentation

## 2018-06-15 DIAGNOSIS — T464X5A Adverse effect of angiotensin-converting-enzyme inhibitors, initial encounter: Secondary | ICD-10-CM

## 2018-06-15 MED ORDER — LOSARTAN POTASSIUM-HCTZ 50-12.5 MG PO TABS: 1.0000 | ORAL_TABLET | Freq: Every day | ORAL | 1 refills | Status: DC

## 2018-06-15 NOTE — Patient Instructions (Addendum)
I think lisinopril is causing symptoms of throat irritation and cough. Trial losartan hctz 50/12.5mg  in its place. Let us know if this doesn't resolve symptoms.

## 2018-06-15 NOTE — Assessment & Plan Note (Addendum)
Anticipate ACEI induced cough.  Will trial ARB. Update with effect.  No signs of infection, doubt GERD related.

## 2018-06-15 NOTE — Progress Notes (Addendum)
BP 126/68 (BP Location: Left Arm, Patient Position: Sitting, Cuff Size: Large)   Pulse 66   Temp 97.8 F (36.6 C) (Oral)   Ht 5\' 10"  (1.778 m)   Wt 286 lb 12 oz (130.1 kg)   SpO2 97%   BMI 41.14 kg/m    CC: throat irritation Subjective:    Patient ID: Aaron Mitchell, male    DOB: 1946/07/13, 72 y.o.   MRN: [id number]  HPI: Aaron Mitchell is a 72 y.o. male presenting on 06/15/2018 for Sore Throat (C/o irritated throat for 4-5 wks. )   Just received bilateral hip bursal injections by Dr Raeford Razor.   6 wk h/o throat irritation associated with irritated cough, occasionally productive.   No significant uri symptoms. No fevers. No GERD trouble. No dysphagia, early satiety, abd pain, nausea/vomiting.   Requests handicap placard -   Relevant past medical, surgical, family and social history reviewed and updated as indicated. Interim medical history since our last visit reviewed. Allergies and medications reviewed and updated. Outpatient Medications Prior to Visit  Medication Sig Dispense Refill  . albuterol (PROVENTIL HFA;VENTOLIN HFA) 108 (90 Base) MCG/ACT inhaler Inhale 2 puffs into the lungs every 6 (six) hours as needed for wheezing or shortness of breath. 3 Inhaler 3  . albuterol (PROVENTIL) (2.5 MG/3ML) 0.083% nebulizer solution Take 3 mLs (2.5 mg total) by nebulization every 6 (six) hours as needed for wheezing or shortness of breath. 90 mL 3  . allopurinol (ZYLOPRIM) 300 MG tablet TAKE 1 TABLET(300 MG) BY MOUTH DAILY 90 tablet 3  . amLODipine (NORVASC) 5 MG tablet TAKE 1 TABLET(5 MG) BY MOUTH TWICE DAILY 180 tablet 2  . atorvastatin (LIPITOR) 40 MG tablet Take 1 tablet (40 mg total) by mouth daily. 90 tablet 3  . diclofenac (VOLTAREN) 75 MG EC tablet TAKE 1 TABLET BY MOUTH TWICE DAILY 180 tablet 0  . diclofenac sodium (VOLTAREN) 1 % GEL Apply 2 g topically daily as needed. Apply to both hands.    . fluticasone (FLONASE) 50 MCG/ACT nasal spray Place 2 sprays into both nostrils  daily. 48 g 0  . HYDROcodone-homatropine (HYCODAN) 5-1.5 MG/5ML syrup Take 5 mLs by mouth 2 (two) times daily as needed for cough (sedation precautions). 140 mL 0  . levocetirizine (XYZAL) 5 MG tablet TAKE 1 TABLET(5 MG) BY MOUTH EVERY EVENING 90 tablet 2  . Magnesium 500 MG TABS Take 0.5-1 tablets (250-500 mg total) by mouth every Monday, Wednesday, and Friday. 30 tablet   . metaxalone (SKELAXIN) 800 MG tablet Take 1 tablet (800 mg total) by mouth 3 (three) times daily as needed for muscle spasms. 270 tablet 0  . Multiple Vitamins tablet Take 1 tablet by mouth daily.    . naproxen sodium (ANAPROX) 220 MG tablet Take 440 mg by mouth daily.     . nitroGLYCERIN (NITRODUR - DOSED IN MG/24 HR) 0.2 mg/hr patch USE 1/2 PATCH DAILY 45 patch 1  . sildenafil (VIAGRA) 100 MG tablet Take by mouth.    . tadalafil (CIALIS) 5 MG tablet Take by mouth.    . testosterone cypionate (DEPOTESTOSTERONE CYPIONATE) 200 MG/ML injection Inject 0.5 mLs (100 mg total) into the muscle once a week. 10 mL 0  . Tiotropium Bromide Monohydrate (SPIRIVA RESPIMAT) 2.5 MCG/ACT AERS Inhale 2 puffs into the lungs daily. 4 g 11  . traMADol (ULTRAM) 50 MG tablet TAKE 1 TABLET BY MOUTH EVERY 8 HOURS AS NEEDED FOR MODERATE PAIN 30 tablet 0  . zolpidem (AMBIEN) 10 MG tablet  Take 1 tablet (10 mg total) by mouth at bedtime as needed. For sleep. 30 tablet 0  . lisinopril-hydrochlorothiazide (PRINZIDE,ZESTORETIC) 20-12.5 MG tablet TAKE 1 TABLET BY MOUTH DAILY 90 tablet 3   No facility-administered medications prior to visit.      Per HPI unless specifically indicated in ROS section below Review of Systems     Objective:    BP 126/68 (BP Location: Left Arm, Patient Position: Sitting, Cuff Size: Large)   Pulse 66   Temp 97.8 F (36.6 C) (Oral)   Ht 5\' 10"  (1.778 m)   Wt 286 lb 12 oz (130.1 kg)   SpO2 97%   BMI 41.14 kg/m   Wt Readings from Last 3 Encounters:  06/15/18 286 lb 12 oz (130.1 kg)  05/04/18 290 lb (131.5 kg)    04/11/18 290 lb 4 oz (131.7 kg)    Physical Exam  Constitutional: He appears well-developed and well-nourished. No distress.  HENT:  Head: Normocephalic and atraumatic.  Right Ear: Hearing, tympanic membrane, external ear and ear canal normal.  Left Ear: Hearing, tympanic membrane, external ear and ear canal normal.  Nose: Nose normal. No mucosal edema or rhinorrhea. Right sinus exhibits no maxillary sinus tenderness and no frontal sinus tenderness. Left sinus exhibits no maxillary sinus tenderness and no frontal sinus tenderness.  Mouth/Throat: Uvula is midline, oropharynx is clear and moist and mucous membranes are normal. No oropharyngeal exudate, posterior oropharyngeal edema, posterior oropharyngeal erythema or tonsillar abscesses.  Eyes: Pupils are equal, round, and reactive to light. Conjunctivae and EOM are normal. No scleral icterus.  Neck: Normal range of motion. Neck supple.  Cardiovascular: Normal rate, regular rhythm and intact distal pulses.  Murmur (mild systolic) heard. Pulmonary/Chest: Effort normal and breath sounds normal. No respiratory distress. He has no wheezes. He has no rales.  Lymphadenopathy:    He has no cervical adenopathy.  Skin: Skin is warm and dry. No rash noted.  Nursing note and vitals reviewed.     Assessment & Plan:   Problem List Items Addressed This Visit    Cough due to ACE inhibitor - Primary    Anticipate ACEI induced cough.  Will trial ARB. Update with effect.  No signs of infection, doubt GERD related.           Meds ordered this encounter  Medications  . losartan-hydrochlorothiazide (HYZAAR) 50-12.5 MG tablet    Sig: Take 1 tablet by mouth daily.    Dispense:  90 tablet    Refill:  1    In place of lisinopril.   No orders of the defined types were placed in this encounter.   Follow up plan: Return if symptoms worsen or fail to improve.  [name], MD

## 2018-07-05 ENCOUNTER — Ambulatory Visit: Payer: PPO

## 2018-07-05 ENCOUNTER — Telehealth: Payer: Self-pay

## 2018-07-05 ENCOUNTER — Other Ambulatory Visit: Payer: Self-pay | Admitting: Family Medicine

## 2018-07-05 ENCOUNTER — Ambulatory Visit (INDEPENDENT_AMBULATORY_CARE_PROVIDER_SITE_OTHER): Payer: PPO

## 2018-07-05 VITALS — BP 136/84 | HR 66 | Temp 97.7°F | Ht 70.5 in | Wt 291.0 lb

## 2018-07-05 DIAGNOSIS — Z0000 Encounter for general adult medical examination without abnormal findings: Secondary | ICD-10-CM | POA: Diagnosis not present

## 2018-07-05 DIAGNOSIS — M1A00X0 Idiopathic chronic gout, unspecified site, without tophus (tophi): Secondary | ICD-10-CM

## 2018-07-05 DIAGNOSIS — R7303 Prediabetes: Secondary | ICD-10-CM

## 2018-07-05 DIAGNOSIS — E291 Testicular hypofunction: Secondary | ICD-10-CM

## 2018-07-05 DIAGNOSIS — E785 Hyperlipidemia, unspecified: Secondary | ICD-10-CM

## 2018-07-05 LAB — COMPREHENSIVE METABOLIC PANEL
ALBUMIN: 4.2 g/dL (ref 3.5–5.2)
ALT: 41 U/L (ref 0–53)
AST: 33 U/L (ref 0–37)
Alkaline Phosphatase: 50 U/L (ref 39–117)
BILIRUBIN TOTAL: 0.8 mg/dL (ref 0.2–1.2)
BUN: 17 mg/dL (ref 6–23)
CALCIUM: 9.1 mg/dL (ref 8.4–10.5)
CHLORIDE: 102 meq/L (ref 96–112)
CO2: 31 mEq/L (ref 19–32)
Creatinine, Ser: 1.11 mg/dL (ref 0.40–1.50)
GFR: 69.55 mL/min (ref >60.00)
Glucose, Bld: 122 mg/dL — ABNORMAL HIGH (ref 70–99)
Potassium: 3.8 mEq/L (ref 3.5–5.1)
SODIUM: 140 meq/L (ref 135–145)
TOTAL PROTEIN: 6.6 g/dL (ref 6.0–8.3)

## 2018-07-05 LAB — LIPID PANEL
Cholesterol: 92 mg/dL (ref 0–200)
HDL: 39.9 mg/dL (ref >39.00)
LDL Cholesterol: 34 mg/dL (ref 0–99)
NonHDL: 52.37
Total CHOL/HDL Ratio: 2
Triglycerides: 93 mg/dL (ref 0.0–149.0)
VLDL: 18.6 mg/dL (ref 0.0–40.0)

## 2018-07-05 LAB — HEMOGLOBIN A1C: Hgb A1c MFr Bld: 6.3 % (ref 4.6–6.5)

## 2018-07-05 LAB — URIC ACID: Uric Acid, Serum: 5.5 mg/dL (ref 4.0–7.8)

## 2018-07-05 NOTE — Telephone Encounter (Signed)
Pt has wellness exam and fasting labs today at 10 AM and pt wants to know if can have coffee with Splenda. Advised pt yes and pt voiced understanding.

## 2018-07-05 NOTE — Patient Instructions (Signed)
Aaron Mitchell , Thank you for taking time to come for your Medicare Wellness Visit. I appreciate your ongoing commitment to your health goals. Please review the following plan we discussed and let me know if I can assist you in the future.   These are the goals we discussed: Goals    . Patient Stated     Starting 07/05/2018, I will continue to take medications as prescribed.        This is a list of the screening recommended for you and due dates:  Health Maintenance  Topic Date Due  . Complete foot exam   07/21/2018*  . DTaP/Tdap/Td vaccine (1 - Tdap) 08/23/2024*  . Eye exam for diabetics  08/16/2018  . Hemoglobin A1C  01/04/2019  . Cologuard (Stool DNA test)  01/30/2021  . Tetanus Vaccine  08/23/2024  . Flu Shot  Completed  .  Hepatitis C: One time screening is recommended by Center for Disease Control  (CDC) for  adults born from [birth year] through 1963.   Completed  . Pneumonia vaccines  Completed  *Topic was postponed. The date shown is not the original due date.   Preventive Care for Adults  A healthy lifestyle and preventive care can promote health and wellness. Preventive health guidelines for adults include the following key practices.  . A routine yearly physical is a good way to check with your health care provider about your health and preventive screening. It is a chance to share any concerns and updates on your health and to receive a thorough exam.  . Visit your dentist for a routine exam and preventive care every 6 months. Brush your teeth twice a day and floss once a day. Good oral hygiene prevents tooth decay and gum disease.  . The frequency of eye exams is based on your age, health, family medical history, use  of contact lenses, and other factors. Follow your health care provider's recommendations for frequency of eye exams.  . Eat a healthy diet. Foods like vegetables, fruits, whole grains, low-fat dairy products, and lean protein foods contain the nutrients you  need without too many calories. Decrease your intake of foods high in solid fats, added sugars, and salt. Eat the right amount of calories for you. Get information about a proper diet from your health care provider, if necessary.  . Regular physical exercise is one of the most important things you can do for your health. Most adults should get at least 150 minutes of moderate-intensity exercise (any activity that increases your heart rate and causes you to sweat) each week. In addition, most adults need muscle-strengthening exercises on 2 or more days a week.  Silver Sneakers may be a benefit available to you. To determine eligibility, you may visit the website: www.silversneakers.com or contact program at (267) 578-4519 Mon-Fri between 8AM-8PM.   . Maintain a healthy weight. The body mass index (BMI) is a screening tool to identify possible weight problems. It provides an estimate of body fat based on height and weight. Your health care provider can find your BMI and can help you achieve or maintain a healthy weight.   For adults 20 years and older: ? A BMI below 18.5 is considered underweight. ? A BMI of 18.5 to 24.9 is normal. ? A BMI of 25 to 29.9 is considered overweight. ? A BMI of 30 and above is considered obese.   . Maintain normal blood lipids and cholesterol levels by exercising and minimizing your intake of saturated fat. Eat a  balanced diet with plenty of fruit and vegetables. Blood tests for lipids and cholesterol should begin at age 41 and be repeated every 5 years. If your lipid or cholesterol levels are high, you are over 50, or you are at high risk for heart disease, you may need your cholesterol levels checked more frequently. Ongoing high lipid and cholesterol levels should be treated with medicines if diet and exercise are not working.  . If you smoke, find out from your health care provider how to quit. If you do not use tobacco, please do not start.  . If you choose to drink  alcohol, please do not consume more than 2 drinks per day. One drink is considered to be 12 ounces (355 mL) of beer, 5 ounces (148 mL) of wine, or 1.5 ounces (44 mL) of liquor.  . If you are 74-95 years old, ask your health care provider if you should take aspirin to prevent strokes.  . Use sunscreen. Apply sunscreen liberally and repeatedly throughout the day. You should seek shade when your shadow is shorter than you. Protect yourself by wearing long sleeves, pants, a wide-brimmed hat, and sunglasses year round, whenever you are outdoors.  . Once a month, do a whole body skin exam, using a mirror to look at the skin on your back. Tell your health care provider of new moles, moles that have irregular borders, moles that are larger than a pencil eraser, or moles that have changed in shape or color.

## 2018-07-05 NOTE — Progress Notes (Signed)
PCP notes:   Health maintenance:  Foot exam - PCP follow-up requested A1C - completed  Abnormal screenings:   Hearing - failed  Hearing Screening   125Hz  250Hz  500Hz  1000Hz  2000Hz  3000Hz  4000Hz  6000Hz  8000Hz   Right ear:   40 40 40  0    Left ear:   40 40 40  40     Patient concerns:   None  Nurse concerns:  None  Next PCP appt:   07/21/18 @ 0930

## 2018-07-05 NOTE — Progress Notes (Signed)
Subjective:   Aaron Mitchell is a 72 y.o. male who presents for Medicare Annual/Subsequent preventive examination.  Review of Systems:  N/A Cardiac Risk Factors include: advanced age (>50men, >48 women);male gender;obesity (BMI >30kg/m2)     Objective:    Vitals: BP 136/84 (BP Location: Right Arm, Patient Position: Sitting, Cuff Size: Large)   Pulse 66   Temp 97.7 F (36.5 C) (Oral)   Ht 5' 10.5" (1.791 m) Comment: shoes  Wt 291 lb (132 kg)   SpO2 97%   BMI 41.16 kg/m   Body mass index is 41.16 kg/m.  Advanced Directives 07/05/2018 07/01/2017 08/05/2016 07/15/2016 12/28/2015 12/28/2015  Does Patient Have a Medical Advance Directive? Yes Yes Yes Yes Yes No  Type of Paramedic of [place];Living will [place];Living will [place];Living will [place];Living will [place];Living will -  Does patient want to make changes to medical advance directive? - - - - No - Patient declined -  Copy of [place] in Chart? No - copy requested No - copy requested Yes Yes Yes -  Would patient like information on creating a medical advance directive? - - - - No - patient declined information No - patient declined information    Tobacco Social History   Tobacco Use  Smoking Status Former Smoker  . Last attempt to quit: 07/27/1978  . Years since quitting: 39.9  Smokeless Tobacco Never Used  Tobacco Comment   Quit in 1980     Counseling given: No Comment: Quit in 1980   Clinical Intake:  Pre-visit preparation completed: Yes  Pain Score: 3      Nutritional Status: BMI > 30  Obese Nutritional Risks: None Diabetes: No  How often do you need to have someone help you when you read instructions, pamphlets, or other written materials from your doctor or pharmacy?: 1 - Never What is the last grade level you completed in school?: Associate degree  Interpreter Needed?:  No  Comments: pt lives with spouse Information entered by :: LPinson, LPN  Past Medical History:  Diagnosis Date  . Achilles tendinitis    Left leg  . Borderline diabetes mellitus   . Childhood asthma   . Degenerative disc disease, lumbar    rec against surgery  . Erectile dysfunction   . GI bleed 12/2015   hospitalization  . Gout   . Hypertension   . Ileus ([place]) 12/2015   hospitalization   . Male hypogonadism    followed by urology on T injections ([place])  . Seasonal allergies    pollen  . Severe obesity (BMI >= 40) (HCC)   . Shoulder bursitis   . Sleep apnea    CPAP   Past Surgical History:  Procedure Laterality Date  . ADENOIDECTOMY    . CARPAL TUNNEL RELEASE Right 2015  . CATARACT EXTRACTION W/PHACO Right 07/15/2016   Procedure: CATARACT EXTRACTION PHACO AND INTRAOCULAR LENS PLACEMENT (IOC);  Surgeon: Leandrew Koyanagi, MD;  Location: [facility];  Service: Ophthalmology;  Laterality: Right;  TORIC sleep apnea  . CATARACT EXTRACTION W/PHACO Left 08/05/2016   Procedure: CATARACT EXTRACTION PHACO AND INTRAOCULAR LENS PLACEMENT (IOC) toric;  Surgeon: Leandrew Koyanagi, MD;  Location: [facility];  Service: Ophthalmology;  Laterality: Left;  left sleep apnea toric  . COLONOSCOPY  10/2012   rpt 5 yrs ([facility])  . EYE SURGERY     Lens replacement 07/12/2016,08/05/2016  . LUMBAR EPIDURAL INJECTION  Bilateral 04/2017, 12/2017   S1 transforaminal ESI ([place])  . RHINOPLASTY    . TONSILLECTOMY  ?1954  . TRIGGER FINGER RELEASE Right October 30, 2013   4th   Family History  Problem Relation Age of Onset  . Alcohol abuse Father   . Hypertension Father   . AVM Father 30       hemorrhagic stroke  . Diabetes Paternal Grandmother   . Cancer Neg Hx   . CAD Neg Hx   . Prostate cancer Neg Hx   . Bladder Cancer Neg Hx   . Kidney cancer Neg Hx    Social History   Socioeconomic History  . Marital status: Married    Spouse name: Not on file  . Number of  children: Not on file  . Years of education: Not on file  . Highest education level: Not on file  Occupational History  . Not on file  Social Needs  . Financial resource strain: Not on file  . Food insecurity:    Worry: Not on file    Inability: Not on file  . Transportation needs:    Medical: Not on file    Non-medical: Not on file  Tobacco Use  . Smoking status: Former Smoker    Last attempt to quit: 07/27/1978    Years since quitting: 39.9  . Smokeless tobacco: Never Used  . Tobacco comment: Quit in 10-31-78  Substance and Sexual Activity  . Alcohol use: Yes    Alcohol/week: 5.0 standard drinks    Types: 5 Shots of liquor per week    Comment:    . Drug use: No  . Sexual activity: Not on file  Lifestyle  . Physical activity:    Days per week: Not on file    Minutes per session: Not on file  . Stress: Not on file  Relationships  . Social connections:    Talks on phone: Not on file    Gets together: Not on file    Attends religious service: Not on file    Active member of club or organization: Not on file    Attends meetings of clubs or organizations: Not on file    Relationship status: Not on file  Other Topics Concern  . Not on file  Social History Narrative   Lives at home with wife [name]), dog passed away 10-31-2015   1 grown child   Occ: retired Clinical biochemist, was Chief Technology Officer    Edu: 2 yrs college   Activity: no regular exercise   Diet: good water, fruits daily, splenda iced tea    Outpatient Encounter Medications as of 07/05/2018  Medication Sig  . albuterol (PROVENTIL HFA;VENTOLIN HFA) 108 (90 Base) MCG/ACT inhaler Inhale 2 puffs into the lungs every 6 (six) hours as needed for wheezing or shortness of breath.  [name] Kitchen albuterol (PROVENTIL) (2.5 MG/3ML) 0.083% nebulizer solution Take 3 mLs (2.5 mg total) by nebulization every 6 (six) hours as needed for wheezing or shortness of breath.  . allopurinol (ZYLOPRIM) 300 MG tablet TAKE 1 TABLET(300 MG) BY MOUTH DAILY  .  amLODipine (NORVASC) 5 MG tablet TAKE 1 TABLET(5 MG) BY MOUTH TWICE DAILY  . atorvastatin (LIPITOR) 40 MG tablet Take 1 tablet (40 mg total) by mouth daily.  . diclofenac (VOLTAREN) 75 MG EC tablet TAKE 1 TABLET BY MOUTH TWICE DAILY  . diclofenac sodium (VOLTAREN) 1 % GEL Apply 2 g topically daily as needed. Apply to both hands.  . fluticasone (FLONASE) 50 MCG/ACT nasal spray Place  2 sprays into both nostrils daily.  [name] Kitchen HYDROcodone-homatropine (HYCODAN) 5-1.5 MG/5ML syrup Take 5 mLs by mouth 2 (two) times daily as needed for cough (sedation precautions).  . losartan-hydrochlorothiazide (HYZAAR) 50-12.5 MG tablet Take 1 tablet by mouth daily.  . Magnesium 500 MG TABS Take 0.5-1 tablets (250-500 mg total) by mouth every Monday, Wednesday, and Friday.  . metaxalone (SKELAXIN) 800 MG tablet Take 1 tablet (800 mg total) by mouth 3 (three) times daily as needed for muscle spasms.  . Multiple Vitamins tablet Take 1 tablet by mouth daily.  . naproxen sodium (ANAPROX) 220 MG tablet Take 440 mg by mouth daily.   . nitroGLYCERIN (NITRODUR - DOSED IN MG/24 HR) 0.2 mg/hr patch USE 1/2 PATCH DAILY  . sildenafil (VIAGRA) 100 MG tablet Take by mouth.  . tadalafil (CIALIS) 5 MG tablet Take by mouth.  . testosterone cypionate (DEPOTESTOSTERONE CYPIONATE) 200 MG/ML injection Inject 0.5 mLs (100 mg total) into the muscle once a week.  . Tiotropium Bromide Monohydrate (SPIRIVA RESPIMAT) 2.5 MCG/ACT AERS Inhale 2 puffs into the lungs daily.  . traMADol (ULTRAM) 50 MG tablet TAKE 1 TABLET BY MOUTH EVERY 8 HOURS AS NEEDED FOR MODERATE PAIN  . zolpidem (AMBIEN) 10 MG tablet Take 1 tablet (10 mg total) by mouth at bedtime as needed. For sleep.  . [DISCONTINUED] levocetirizine (XYZAL) 5 MG tablet TAKE 1 TABLET(5 MG) BY MOUTH EVERY EVENING   No facility-administered encounter medications on file as of 07/05/2018.     Activities of Daily Living In your present state of health, do you have any difficulty performing the  following activities: 07/05/2018  Hearing? N  Vision? N  Difficulty concentrating or making decisions? N  Walking or climbing stairs? Y  Dressing or bathing? N  Doing errands, shopping? N  Preparing Food and eating ? N  Using the Toilet? N  In the past six months, have you accidently leaked urine? N  Do you have problems with loss of bowel control? N  Managing your Medications? N  Managing your Finances? N  Housekeeping or managing your Housekeeping? N  Some recent data might be hidden    Patient Care Team: [name], MD as PCP - General (Family Medicine) Emmaline Kluver., MD (Rheumatology) Leandrew Koyanagi, MD as Referring Physician (Ophthalmology)   Assessment:   This is a routine wellness examination for [place].   Hearing Screening   125Hz  250Hz  500Hz  1000Hz  2000Hz  3000Hz  4000Hz  6000Hz  8000Hz   Right ear:   40 40 40  0    Left ear:   40 40 40  40    Vision Screening Comments: Vision exam in December 2018 with Dr. Wallace Going   Exercise Activities and Dietary recommendations Current Exercise Habits: The patient does not participate in regular exercise at present, Exercise limited by: None identified  Goals    . Patient Stated     Starting 07/05/2018, I will continue to take medications as prescribed.        Fall Risk Fall Risk  07/05/2018 07/01/2017 03/30/2017  Falls in the past year? 0 No No   Depression Screen PHQ 2/9 Scores 07/05/2018 07/01/2017 03/30/2017  PHQ - 2 Score 0 0 0  PHQ- 9 Score 0 0 -    Cognitive Function MMSE - Mini Mental State Exam 07/05/2018 07/01/2017  Orientation to time 5 5  Orientation to Place 5 5  Registration 3 3  Attention/ Calculation 0 0  Recall 3 1  Recall-comments - unable to recall 2 of 3  words  Language- name 2 objects 0 0  Language- repeat 1 1  Language- follow 3 step command 3 3  Language- read & follow direction 0 0  Write a sentence 0 0  Copy design 0 0  Total score 20 18     PLEASE NOTE: A Mini-Cog  screen was completed. Maximum score is 20. A value of 0 denotes this part of [place] MMSE was not completed or the patient failed this part of the Mini-Cog screening.   Mini-Cog Screening Orientation to Time - Max 5 pts Orientation to Place - Max 5 pts Registration - Max 3 pts Recall - Max 3 pts Language Repeat - Max 1 pts Language Follow 3 Step Command - Max 3 pts     Immunization History  Administered Date(s) Administered  . Influenza, High Dose Seasonal PF 05/14/2017, 04/07/2018  . Influenza-Unspecified 08/27/2016  . Pneumococcal Conjugate-13 08/23/2014  . Pneumococcal Polysaccharide-23 03/26/2016  . Td 08/23/2014  . Zoster 08/08/2013  . Zoster Recombinat (Shingrix) 03/14/2018, 05/16/2018    Screening Tests Health Maintenance  Topic Date Due  . FOOT EXAM  07/21/2018 (Originally 03/27/1956)  . DTaP/Tdap/Td (1 - Tdap) 08/23/2024 (Originally 08/24/2014)  . OPHTHALMOLOGY EXAM  08/16/2018  . HEMOGLOBIN A1C  01/04/2019  . Fecal DNA (Cologuard)  01/30/2021  . TETANUS/TDAP  08/23/2024  . INFLUENZA VACCINE  Completed  . Hepatitis C Screening  Completed  . PNA vac Low Risk Adult  Completed       Plan:     I have personally reviewed, addressed, and noted the following in the patient's chart:  A. Medical and social history B. Use of alcohol, tobacco or illicit drugs  C. Current medications and supplements D. Functional ability and status E.  Nutritional status F.  Physical activity G. Advance directives H. List of other physicians I.  Hospitalizations, surgeries, and ER visits in previous 12 months J.  [place] to include hearing, vision, cognitive, depression L. Referrals and appointments - none  In addition, I have reviewed and discussed with patient certain preventive protocols, quality metrics, and best practice recommendations. A written personalized care plan for preventive services as well as general preventive health recommendations were provided to  patient.  See attached scanned questionnaire for additional information.   Signed,   Lindell Noe, MHA, BS, LPN Health Coach

## 2018-07-07 ENCOUNTER — Encounter: Payer: PPO | Admitting: Family Medicine

## 2018-07-15 ENCOUNTER — Encounter: Payer: PPO | Admitting: Family Medicine

## 2018-07-20 ENCOUNTER — Encounter: Payer: Self-pay | Admitting: Family Medicine

## 2018-07-20 NOTE — Progress Notes (Addendum)
BP 124/70 (BP Location: Left Arm, Patient Position: Sitting, Cuff Size: Large)   Pulse 72   Temp 97.6 F (36.4 C) (Oral)   Ht 5' 10.5" (1.791 m)   Wt 296 lb 4 oz (134.4 kg)   SpO2 96%   BMI 41.91 kg/m    CC: CPE Subjective:    Patient ID: Aaron Mitchell, male    DOB: 08-04-1945, 71 y.o.   MRN: [id number]  HPI: Aaron Mitchell is a 72 y.o. male presenting on 07/21/2018 for Annual Exam (Pt 2. Just returned from a cruise and started sneezing yesterday. )   Recent cruise to [place].  Asks about return to gym - advised slow titration of exercise Advised to return to cards as overdue.   Last month we changed ACEI to hyzaar due to ongoing nagging dry cough - this has helped.   Saw Lesia earlier this month for medicare wellness visit. Note reviewed.    Preventative: COLONOSCOPY 10/2012 rpt 5 yrs ([facility]). Cologuard 01/2018 WNL.  Prostate cancer screening - followed by urologist  Lung cancer screening - ex smoker quit 1980  Flu shot yearly Td 2014-11-01  prevnar 01-Nov-2014, pneumovax 2015/11/01  zostavax 10/31/2013 shingrix - completed 04/2018  Advanced directive discussion - has at home. Daughter and wife are HCPOA. Eva with temporary life support but not prolonged if terminal condition. Unsure about feeding tube. Will bring me copy  Seat belt use discussed  Sunscreen use and skin screen discussed  Ex smoker remotely quit 1980. 2.5 ppd x 15 yrs Alcohol - infrequent Dentist q6 mo Eye exam yearly  Lives at home with wife [name]), dog passed away 01-Nov-2015 1 grown child Occ: retired Clinical biochemist, was Chief Technology Officer  Edu: 2 yrs college Activity: no regular exercise  Diet: good water,fruitsdaily, splenda iced tea     Relevant past medical, surgical, family and social history reviewed and updated as indicated. Interim medical history since our last visit reviewed. Allergies and medications reviewed and updated. Outpatient Medications Prior to Visit  Medication Sig Dispense Refill  . albuterol  (PROVENTIL HFA;VENTOLIN HFA) 108 (90 Base) MCG/ACT inhaler Inhale 2 puffs into the lungs every 6 (six) hours as needed for wheezing or shortness of breath. 3 Inhaler 3  . albuterol (PROVENTIL) (2.5 MG/3ML) 0.083% nebulizer solution Take 3 mLs (2.5 mg total) by nebulization every 6 (six) hours as needed for wheezing or shortness of breath. 90 mL 3  . allopurinol (ZYLOPRIM) 300 MG tablet TAKE 1 TABLET(300 MG) BY MOUTH DAILY 90 tablet 3  . atorvastatin (LIPITOR) 40 MG tablet Take 1 tablet (40 mg total) by mouth daily. 90 tablet 3  . diclofenac sodium (VOLTAREN) 1 % GEL Apply 2 g topically daily as needed. Apply to both hands.    . fluticasone (FLONASE) 50 MCG/ACT nasal spray Place 2 sprays into both nostrils daily. 48 g 0  . Magnesium 500 MG TABS Take 0.5-1 tablets (250-500 mg total) by mouth every Monday, Wednesday, and Friday. 30 tablet   . metaxalone (SKELAXIN) 800 MG tablet Take 1 tablet (800 mg total) by mouth 3 (three) times daily as needed for muscle spasms. 270 tablet 0  . Multiple Vitamins tablet Take 1 tablet by mouth daily.    . naproxen sodium (ANAPROX) 220 MG tablet Take 440 mg by mouth daily.     . sildenafil (VIAGRA) 100 MG tablet Take by mouth.    . tadalafil (CIALIS) 5 MG tablet Take by mouth.    . Tiotropium Bromide Monohydrate (SPIRIVA RESPIMAT)  2.5 MCG/ACT AERS Inhale 2 puffs into the lungs daily. 4 g 11  . traMADol (ULTRAM) 50 MG tablet TAKE 1 TABLET BY MOUTH EVERY 8 HOURS AS NEEDED FOR MODERATE PAIN 30 tablet 0  . zolpidem (AMBIEN) 10 MG tablet Take 1 tablet (10 mg total) by mouth at bedtime as needed. For sleep. 30 tablet 0  . amLODipine (NORVASC) 5 MG tablet TAKE 1 TABLET(5 MG) BY MOUTH TWICE DAILY 180 tablet 2  . diclofenac (VOLTAREN) 75 MG EC tablet TAKE 1 TABLET BY MOUTH TWICE DAILY 180 tablet 0  . HYDROcodone-homatropine (HYCODAN) 5-1.5 MG/5ML syrup Take 5 mLs by mouth 2 (two) times daily as needed for cough (sedation precautions). 140 mL 0  . losartan-hydrochlorothiazide  (HYZAAR) 50-12.5 MG tablet Take 1 tablet by mouth daily. 90 tablet 1  . nitroGLYCERIN (NITRODUR - DOSED IN MG/24 HR) 0.2 mg/hr patch USE 1/2 PATCH DAILY 45 patch 1  . testosterone cypionate (DEPOTESTOSTERONE CYPIONATE) 200 MG/ML injection Inject 0.5 mLs (100 mg total) into the muscle once a week. 10 mL 0   No facility-administered medications prior to visit.      Per HPI unless specifically indicated in ROS section below Review of Systems  Constitutional: Negative for activity change, appetite change, chills, fatigue, fever and unexpected weight change.  HENT: Positive for congestion and sneezing. Negative for hearing loss.        Of 1d duration  Eyes: Negative for visual disturbance.  Respiratory: Positive for shortness of breath. Negative for cough, chest tightness and wheezing.   Cardiovascular: Negative for chest pain, palpitations and leg swelling.  Gastrointestinal: Negative for abdominal distention, abdominal pain, blood in stool, constipation, diarrhea, nausea and vomiting.  Genitourinary: Negative for difficulty urinating and hematuria.  Musculoskeletal: Negative for arthralgias, myalgias and neck pain.  Skin: Negative for rash.  Neurological: Positive for headaches (intermittent this past month). Negative for dizziness, seizures and syncope.  Hematological: Negative for adenopathy. Bruises/bleeds easily.  Psychiatric/Behavioral: Negative for dysphoric mood. The patient is not nervous/anxious.    Objective:    BP 124/70 (BP Location: Left Arm, Patient Position: Sitting, Cuff Size: Large)   Pulse 72   Temp 97.6 F (36.4 C) (Oral)   Ht 5' 10.5" (1.791 m)   Wt 296 lb 4 oz (134.4 kg)   SpO2 96%   BMI 41.91 kg/m   Wt Readings from Last 3 Encounters:  09/21/18 298 lb (135.2 kg)  09/07/18 294 lb (133.4 kg)  07/21/18 296 lb 4 oz (134.4 kg)    Physical Exam Vitals signs and nursing note reviewed.  Constitutional:      General: He is not in acute distress.    Appearance: He  is well-developed.  HENT:     Head: Normocephalic and atraumatic.     Right Ear: Hearing, tympanic membrane, ear canal and external ear normal.     Left Ear: Hearing, tympanic membrane, ear canal and external ear normal.     Nose: Congestion and rhinorrhea present.     Mouth/Throat:     Pharynx: Uvula midline. No oropharyngeal exudate or posterior oropharyngeal erythema.  Eyes:     General: No scleral icterus.    Conjunctiva/sclera: Conjunctivae normal.     Pupils: Pupils are equal, round, and reactive to light.  Neck:     Musculoskeletal: Normal range of motion and neck supple.     Vascular: No carotid bruit.  Cardiovascular:     Rate and Rhythm: Normal rate and regular rhythm.     Pulses:  Radial pulses are 2+ on the right side and 2+ on the left side.     Heart sounds: Normal heart sounds. No murmur.  Pulmonary:     Effort: Pulmonary effort is normal. No respiratory distress.     Breath sounds: Normal breath sounds. No wheezing or rales.  Abdominal:     General: Bowel sounds are normal. There is no distension.     Palpations: Abdomen is soft. There is no mass.     Tenderness: There is no abdominal tenderness. There is no guarding or rebound.  Musculoskeletal: Normal range of motion.  Lymphadenopathy:     Cervical: No cervical adenopathy.  Skin:    General: Skin is warm and dry.     Findings: No rash.  Neurological:     Mental Status: He is alert and oriented to person, place, and time.     Comments: CN grossly intact, station and gait intact  Psychiatric:        Behavior: Behavior normal.        Thought Content: Thought content normal.        Judgment: Judgment normal.       Results for orders placed or performed in visit on 07/05/18  Uric acid  Result Value Ref Range   Uric Acid, Serum 5.5 4.0 - 7.8 mg/dL  Hemoglobin A1c  Result Value Ref Range   Hgb A1c MFr Bld 6.3 4.6 - 6.5 %  Comprehensive metabolic panel  Result Value Ref Range   Sodium 140 135 -  145 mEq/L   Potassium 3.8 3.5 - 5.1 mEq/L   Chloride 102 96 - 112 mEq/L   CO2 31 19 - 32 mEq/L   Glucose, Bld 122 (H) 70 - 99 mg/dL   BUN 17 6 - 23 mg/dL   Creatinine, Ser 1.11 0.40 - 1.50 mg/dL   Total Bilirubin 0.8 0.2 - 1.2 mg/dL   Alkaline Phosphatase 50 39 - 117 U/L   AST 33 0 - 37 U/L   ALT 41 0 - 53 U/L   Total Protein 6.6 6.0 - 8.3 g/dL   Albumin 4.2 3.5 - 5.2 g/dL   Calcium 9.1 8.4 - 10.5 mg/dL   GFR 69.55 >60.00 mL/min  Lipid panel  Result Value Ref Range   Cholesterol 92 0 - 200 mg/dL   Triglycerides 93.0 0.0 - 149.0 mg/dL   HDL 39.90 >39.00 mg/dL   VLDL 18.6 0.0 - 40.0 mg/dL   LDL Cholesterol 34 0 - 99 mg/dL   Total CHOL/HDL Ratio 2    NonHDL 52.37    Assessment & Plan:   Problem List Items Addressed This Visit    Viral URI    Supportive care reviewed.       Stage 3 severe COPD by GOLD classification (HCC)    Chronic, stable. Continue spiriva.       Severe obesity (BMI 35.0-39.9) with comorbidity ([place])    Weight gain noted. Encouraged healthy diet and lifestyle changes to affect sustainable weight loss. Motivated to restart regular exercise routine at gym. Encouraged slow titration.       Prediabetes    Encouraged low sugar diet.       Osteoarthritis   OSA (obstructive sleep apnea)    Regular with CPAP machine.  ADDENDUM 09/26/2018 ==> patient currently using CPAP regularly, receiving benefit of therapy to treat his OSA.       Male hypogonadism    Sees urology for this.       Insomnia  Rare ambien use. Last filled 04/2017      HLD (hyperlipidemia)    Chronic, stable. Continue atorvastatin.  The ASCVD Risk score Mikey Bussing DC Jr., et al., 2013) failed to calculate for the following reasons:   The valid total cholesterol range is 130 to 320 mg/dL       Health maintenance examination - Primary    Preventative protocols reviewed and updated unless pt declined. Discussed healthy diet and lifestyle.       Essential (primary) hypertension     Chronic, stable. Continue current regimen. Tolerating ARB better.      Degenerative disc disease, lumbar   Chronic gouty arthropathy    Chronic, stable on allopurinol 300mg  daily.       Chronic back pain    Rare tramadol use.       Background diabetic retinopathy ([place])       No orders of the defined types were placed in this encounter.  No orders of the defined types were placed in this encounter.   Follow up plan: Return in about 6 months (around 01/20/2019) for follow up visit.  [name], MD

## 2018-07-20 NOTE — Assessment & Plan Note (Signed)
Preventative protocols reviewed and updated unless pt declined. Discussed healthy diet and lifestyle.  

## 2018-07-21 ENCOUNTER — Ambulatory Visit (INDEPENDENT_AMBULATORY_CARE_PROVIDER_SITE_OTHER): Payer: PPO | Admitting: Family Medicine

## 2018-07-21 ENCOUNTER — Encounter: Payer: Self-pay | Admitting: Family Medicine

## 2018-07-21 VITALS — BP 124/70 | HR 72 | Temp 97.6°F | Ht 70.5 in | Wt 296.2 lb

## 2018-07-21 DIAGNOSIS — I10 Essential (primary) hypertension: Secondary | ICD-10-CM | POA: Diagnosis not present

## 2018-07-21 DIAGNOSIS — J449 Chronic obstructive pulmonary disease, unspecified: Secondary | ICD-10-CM

## 2018-07-21 DIAGNOSIS — J069 Acute upper respiratory infection, unspecified: Secondary | ICD-10-CM

## 2018-07-21 DIAGNOSIS — M150 Primary generalized (osteo)arthritis: Secondary | ICD-10-CM

## 2018-07-21 DIAGNOSIS — M5136 Other intervertebral disc degeneration, lumbar region: Secondary | ICD-10-CM | POA: Diagnosis not present

## 2018-07-21 DIAGNOSIS — E785 Hyperlipidemia, unspecified: Secondary | ICD-10-CM | POA: Diagnosis not present

## 2018-07-21 DIAGNOSIS — M549 Dorsalgia, unspecified: Secondary | ICD-10-CM

## 2018-07-21 DIAGNOSIS — Z0000 Encounter for general adult medical examination without abnormal findings: Secondary | ICD-10-CM | POA: Diagnosis not present

## 2018-07-21 DIAGNOSIS — E291 Testicular hypofunction: Secondary | ICD-10-CM

## 2018-07-21 DIAGNOSIS — G8929 Other chronic pain: Secondary | ICD-10-CM

## 2018-07-21 DIAGNOSIS — E113299 Type 2 diabetes mellitus with mild nonproliferative diabetic retinopathy without macular edema, unspecified eye: Secondary | ICD-10-CM

## 2018-07-21 DIAGNOSIS — G4700 Insomnia, unspecified: Secondary | ICD-10-CM

## 2018-07-21 DIAGNOSIS — G4733 Obstructive sleep apnea (adult) (pediatric): Secondary | ICD-10-CM

## 2018-07-21 DIAGNOSIS — R7303 Prediabetes: Secondary | ICD-10-CM | POA: Diagnosis not present

## 2018-07-21 DIAGNOSIS — M8949 Other hypertrophic osteoarthropathy, multiple sites: Secondary | ICD-10-CM

## 2018-07-21 DIAGNOSIS — M1A00X0 Idiopathic chronic gout, unspecified site, without tophus (tophi): Secondary | ICD-10-CM | POA: Diagnosis not present

## 2018-07-21 DIAGNOSIS — M159 Polyosteoarthritis, unspecified: Secondary | ICD-10-CM

## 2018-07-21 NOTE — Patient Instructions (Addendum)
Bring [place] copy of advanced directive.  You are doing well today - continue current medicines.  Look into CoEnzyme Q 10 supplement.  Return in 6 months for follow up visit. You are doing well today.   Health Maintenance After Age 70 After age 21, you are at a higher risk for certain long-term diseases and infections as well as injuries from falls. Falls are a major cause of broken bones and head injuries in people who are older than age 57. Getting regular preventive care can help to keep you healthy and well. Preventive care includes getting regular testing and making lifestyle changes as recommended by your health care provider. Talk with your health care provider about:  Which screenings and tests you should have. A screening is a test that checks for a disease when you have no symptoms.  A diet and exercise plan that is right for you. What should I know about screenings and tests to prevent falls? Screening and testing are the best ways to find a health problem early. Early diagnosis and treatment give you the best chance of managing medical conditions that are common after age 45. Certain conditions and lifestyle choices may make you more likely to have a fall. Your health care provider may recommend:  Regular vision checks. Poor vision and conditions such as cataracts can make you more likely to have a fall. If you wear glasses, make sure to get your prescription updated if your vision changes.  Medicine review. Work with your health care provider to regularly review all of the medicines you are taking, including over-the-counter medicines. Ask your health care provider about any side effects that may make you more likely to have a fall. Tell your health care provider if any medicines that you take make you feel dizzy or sleepy.  Osteoporosis screening. Osteoporosis is a condition that causes the bones to get weaker. This can make the bones weak and cause them to break more easily.  Blood  pressure screening. Blood pressure changes and medicines to control blood pressure can make you feel dizzy.  Strength and balance checks. Your health care provider may recommend certain tests to check your strength and balance while standing, walking, or changing positions.  Foot health exam. Foot pain and numbness, as well as not wearing proper footwear, can make you more likely to have a fall.  Depression screening. You may be more likely to have a fall if you have a fear of falling, feel emotionally low, or feel unable to do activities that you used to do.  Alcohol use screening. Using too much alcohol can affect your balance and may make you more likely to have a fall. What actions can I take to lower my risk of falls? General instructions  Talk with your health care provider about your risks for falling. Tell your health care provider if: ? You fall. Be sure to tell your health care provider about all falls, even ones that seem minor. ? You feel dizzy, sleepy, or off-balance.  Take over-the-counter and prescription medicines only as told by your health care provider. These include any supplements.  Eat a healthy diet and maintain a healthy weight. A healthy diet includes low-fat dairy products, low-fat (lean) meats, and fiber from whole grains, beans, and lots of fruits and vegetables. Home safety  Remove any tripping hazards, such as rugs, cords, and clutter.  Install safety equipment such as grab bars in bathrooms and safety rails on stairs.  Keep rooms and walkways  well-lit. Activity   Follow a regular exercise program to stay fit. This will help you maintain your balance. Ask your health care provider what types of exercise are appropriate for you.  If you need a cane or walker, use it as recommended by your health care provider.  Wear supportive shoes that have nonskid soles. Lifestyle  Do not drink alcohol if your health care provider tells you not to drink.  If you  drink alcohol, limit how much you have: ? 0-1 drink a day for women. ? 0-2 drinks a day for men.  Be aware of how much alcohol is in your drink. In the [place] one drink equals one typical bottle of beer (12 oz), one-half glass of wine (5 oz), or one shot of hard liquor (1 oz).  Do not use any products that contain nicotine or tobacco, such as cigarettes and e-cigarettes. If you need help quitting, ask your health care provider. Summary  Having a healthy lifestyle and getting preventive care can help to protect your health and wellness after age 51.  Screening and testing are the best way to find a health problem early and help you avoid having a fall. Early diagnosis and treatment give you the best chance for managing medical conditions that are more common for people who are older than age 54.  Falls are a major cause of broken bones and head injuries in people who are older than age 81. Take precautions to prevent a fall at home.  Work with your health care provider to learn what changes you can make to improve your health and wellness and to prevent falls. This information is not intended to replace advice given to you by your health care provider. Make sure you discuss any questions you have with your health care provider. Document Released: 05/26/2017 Document Revised: 05/26/2017 Document Reviewed: 05/26/2017 Elsevier Interactive Patient Education  2019 [employer].

## 2018-07-21 NOTE — Assessment & Plan Note (Addendum)
Rare tramadol use.

## 2018-07-21 NOTE — Assessment & Plan Note (Signed)
Chronic, stable. Continue current regimen. Tolerating ARB better.

## 2018-07-21 NOTE — Assessment & Plan Note (Signed)
Chronic, stable. Continue spiriva.

## 2018-07-21 NOTE — Assessment & Plan Note (Signed)
Chronic, stable. Continue atorvastatin  The ASCVD Risk score (Goff DC Jr., et al., 2013) failed to calculate for the following reasons:   The valid total cholesterol range is 130 to 320 mg/dL  

## 2018-07-21 NOTE — Assessment & Plan Note (Signed)
Chronic, stable on allopurinol 300mg  daily.

## 2018-07-21 NOTE — Assessment & Plan Note (Signed)
Weight gain noted. Encouraged healthy diet and lifestyle changes to affect sustainable weight loss. Motivated to restart regular exercise routine at gym. Encouraged slow titration.

## 2018-07-21 NOTE — Assessment & Plan Note (Signed)
Sees urology for this.  

## 2018-07-21 NOTE — Assessment & Plan Note (Signed)
Encouraged low sugar diet.

## 2018-07-21 NOTE — Assessment & Plan Note (Signed)
Supportive care reviewed.  

## 2018-07-21 NOTE — Assessment & Plan Note (Signed)
Rare ambien use. Last filled 04/2017

## 2018-07-21 NOTE — Assessment & Plan Note (Addendum)
Regular with CPAP machine.  ADDENDUM 09/26/2018 ==> patient currently using CPAP regularly, receiving benefit of therapy to treat his OSA.

## 2018-08-01 ENCOUNTER — Other Ambulatory Visit: Payer: Self-pay | Admitting: Family Medicine

## 2018-08-03 MED ORDER — TESTOSTERONE CYPIONATE 200 MG/ML IM SOLN: 100.0000 mg | INTRAMUSCULAR | 0 refills | Status: DC

## 2018-08-09 DIAGNOSIS — E089 Diabetes mellitus due to underlying condition without complications: Secondary | ICD-10-CM | POA: Diagnosis not present

## 2018-08-09 DIAGNOSIS — H524 Presbyopia: Secondary | ICD-10-CM | POA: Diagnosis not present

## 2018-08-09 DIAGNOSIS — Z961 Presence of intraocular lens: Secondary | ICD-10-CM | POA: Diagnosis not present

## 2018-08-09 DIAGNOSIS — E119 Type 2 diabetes mellitus without complications: Secondary | ICD-10-CM | POA: Diagnosis not present

## 2018-08-12 DIAGNOSIS — G4733 Obstructive sleep apnea (adult) (pediatric): Secondary | ICD-10-CM | POA: Diagnosis not present

## 2018-08-18 ENCOUNTER — Other Ambulatory Visit: Payer: Self-pay

## 2018-08-18 NOTE — Telephone Encounter (Signed)
Pt requesting refill spiriva handihaler 18 mcg inhalation cap. This Rx was on hx med list. Pt said this works better than the spiriva respimat. Pt requesting printed rx and request cb when done. While I was typing this note pt called back and he found the 11/15/17 rx and said to disregard previous request; nothing further needed.

## 2018-09-02 ENCOUNTER — Telehealth: Payer: Self-pay | Admitting: Family Medicine

## 2018-09-02 NOTE — Telephone Encounter (Signed)
Larose called office due to the pt wanting to switch companies for his medical supplies. Pt needs Dr.G to fax a order over to [facility] for a new CPAP. Fax number [phone]

## 2018-09-04 NOTE — Progress Notes (Signed)
I reviewed health advisor's note, was available for consultation, and agree with documentation and plan.  

## 2018-09-06 ENCOUNTER — Other Ambulatory Visit: Payer: Self-pay

## 2018-09-06 ENCOUNTER — Other Ambulatory Visit: Payer: PPO

## 2018-09-06 DIAGNOSIS — E291 Testicular hypofunction: Secondary | ICD-10-CM | POA: Diagnosis not present

## 2018-09-07 ENCOUNTER — Ambulatory Visit: Payer: PPO | Admitting: Urology

## 2018-09-07 ENCOUNTER — Encounter: Payer: Self-pay | Admitting: Urology

## 2018-09-07 VITALS — BP 137/82 | HR 71 | Ht 70.0 in | Wt 294.0 lb

## 2018-09-07 DIAGNOSIS — R7989 Other specified abnormal findings of blood chemistry: Secondary | ICD-10-CM

## 2018-09-07 LAB — TESTOSTERONE: Testosterone: 1042 ng/dL — ABNORMAL HIGH (ref 264–916)

## 2018-09-07 LAB — PSA: Prostate Specific Ag, Serum: 0.9 ng/mL (ref 0.0–4.0)

## 2018-09-07 LAB — HEMATOCRIT: Hematocrit: 49.9 % (ref 37.5–51.0)

## 2018-09-07 NOTE — Telephone Encounter (Signed)
Faxed order to [facility] at [phone].  

## 2018-09-07 NOTE — Patient Instructions (Signed)
Testosterone Replacement Therapy  Testosterone replacement therapy (TRT) is used to treat men who have a low testosterone level (hypogonadism). Testosterone is a male hormone that is produced in the testicles. It is responsible for typically male characteristics and for maintaining a man's sex drive and the ability to get an erection. Testosterone also supports bone and muscle health. TRT can be a gel, liquid, or patch that you put on your skin. It can also be in the form of a tablet or an injection. In some cases, your health care provider may insert long-acting pellets under your skin. In most men, the level of testosterone starts to decline gradually after age 58. Low testosterone can also be caused by certain medical conditions, medicines, and obesity. Your health care provider can diagnose hypogonadism with at least two blood tests that are done early in the morning. Low testosterone may not need to be treated. TRT is usually a choice that you make with your health care provider. Your health care provider may recommend TRT if you have low testosterone that is causing symptoms, such as:  Low sex drive.  Erection problems.  Breast enlargement.  Loss of body hair.  Weak muscles or bones.  Shrinking testicles.  Increased body fat.  Low energy.  Hot flashes.  Depression.  Decreased work Systems analyst. TRT is a lifetime treatment. If you stop treatment, your testosterone will drop, and your symptoms may return. What are the risks? Testosterone replacement therapy may have side effects, including:  Lower sperm count.  Skin irritation at the application or injection site.  Mouth irritation if you take an oral tablet.  Acne.  Swelling of your legs or feet.  Tender breasts.  Dizziness.  Sleep disturbance.  Mood swings.  Possible increased risk of stroke or heart attack. Testosterone replacement therapy may also increase your risk for prostate cancer or male breast cancer.  You should not use TRT if you have either of those conditions. Your health care provider also may not recommend TRT if:  You are suspected of having prostate cancer.  You want to father a child.  You have a high number of red blood cells.  You have untreated sleep apnea.  You have a very large prostate. Supplies needed:  Your health care provider will prescribe the testosterone gel, solution, or medicine that you need. If your health care provider teaches you to do self-injections at home, you will also need: ? Your medicine vial. ? Disposable needles and syringes. ? Alcohol swabs. ? A needle disposal container. ? Adhesive bandages. How to use testosterone replacement therapy Your health care provider will help you find the TRT option that will work best for you based on your preference, the side effects, and the cost. You may:  Rub testosterone gel on your upper arm or shoulder every day after a shower. This is the most common type of TRT. Do not let women or children come in contact with the gel.  Apply a testosterone solution under your arms once each day.  Place a testosterone patch on your skin once each day.  Dissolve a testosterone tablet in your mouth twice each day.  Have a testosterone pellet inserted under your skin by your health care provider. This will be replaced every 3-6 months.  Use testosterone nasal spray three times each day.  Get testosterone injections. For some types of testosterone, your health care provider will give you this injection. With other types of testosterone, you may be taught to give injections to  yourself. The frequency of injections may vary based on the type of testosterone that you receive. Follow these instructions at home:  Take over-the-counter and prescription medicines only as told by your health care provider.  Lose weight if you are overweight. Ask your health care provider to help you start a healthy diet and exercise program to  reach and maintain a healthy weight.  Work with your health care provider to treat other medical conditions that may lower your testosterone. These include obesity, high blood pressure, high cholesterol, diabetes, liver disease, kidney disease, and sleep apnea.  Keep all follow-up visits as told by your health care provider. This is important. General recommendations  Discuss all risks and benefits with your health care provider before starting therapy.  Work with your health care provider to check your prostate health and do blood testing before you start therapy.  Do not use any testosterone replacement therapies that are not prescribed by your health care provider or not approved for use in the [place]  Do not use TRT for bodybuilding or to improve sexual performance. TRT should be used only to treat symptoms of low testosterone.  Return for all repeat prostate checks and blood tests during therapy, as told by your health care provider. Where to find more information Learn more about testosterone replacement therapy from:  Page: www.urologyhealth.org/urologic-conditions/low-testosterone-(hypogonadism)  Endocrine Society: www.hormone.org/diseases-and-conditions/mens-health/hypogonadism Contact a health care provider if:  You have side effects from your testosterone replacement therapy.  You continue to have symptoms of low testosterone during treatment.  You develop new symptoms during treatment. Summary  Testosterone replacement therapy is only for men who have low testosterone as determined by blood testing and who have symptoms of low testosterone.  Testosterone replacement therapy should be prescribed only by a health care provider and should be used under the supervision of a health care provider.  You may not be able to take testosterone if you have certain medical conditions, including prostate cancer, male breast cancer, or heart  disease.  Testosterone replacement therapy may have side effects and may make some medical conditions worse.  Talk with your health care provider about all the risks and benefits before you start therapy. This information is not intended to replace advice given to you by your health care provider. Make sure you discuss any questions you have with your health care provider. Document Released: 04/02/2016 Document Revised: 04/02/2016 Document Reviewed: 04/02/2016 Elsevier Interactive Patient Education  2019 Elsevier Inc.    Testosterone injection What is this medicine? TESTOSTERONE (tes TOS ter one) is the main male hormone. It supports normal male development such as muscle growth, facial hair, and deep voice. It is used in males to treat low testosterone levels. This medicine may be used for other purposes; ask your health care provider or pharmacist if you have questions. COMMON BRAND NAME(S): Andro-L.A., Aveed, Delatestryl, Depo-Testosterone, Virilon What should I tell my health care provider before I take this medicine? They need to know if you have any of these conditions: -cancer -diabetes -heart disease -kidney disease -liver disease -lung disease -prostate disease -an unusual or allergic reaction to testosterone, other medicines, foods, dyes, or preservatives -pregnant or trying to get pregnant -breast-feeding How should I use this medicine? This medicine is for injection into a muscle. It is usually given by a health care professional in a hospital or clinic setting. Contact your pediatrician regarding the use of this medicine in children. While this medicine may be prescribed for children as young  as 44 years of age for selected conditions, precautions do apply. Overdosage: If you think you have taken too much of this medicine contact a poison control center or emergency room at once. NOTE: This medicine is only for you. Do not share this medicine with others. What if I miss  a dose? Try not to miss a dose. Your doctor or health care professional will tell you when your next injection is due. Notify the office if you are unable to keep an appointment. What may interact with this medicine? -medicines for diabetes -medicines that treat or prevent blood clots like warfarin -oxyphenbutazone -propranolol -steroid medicines like prednisone or cortisone This list may not describe all possible interactions. Give your health care provider a list of all the medicines, herbs, non-prescription drugs, or dietary supplements you use. Also tell them if you smoke, drink alcohol, or use illegal drugs. Some items may interact with your medicine. What should I watch for while using this medicine? Visit your doctor or health care professional for regular checks on your progress. They will need to check the level of testosterone in your blood. This medicine is only approved for use in men who have low levels of testosterone related to certain medical conditions. Heart attacks and strokes have been reported with the use of this medicine. Notify your doctor or health care professional and seek emergency treatment if you develop breathing problems; changes in vision; confusion; chest pain or chest tightness; sudden arm pain; severe, sudden headache; trouble speaking or understanding; sudden numbness or weakness of the face, arm or leg; loss of balance or coordination. Talk to your doctor about the risks and benefits of this medicine. This medicine may affect blood sugar levels. If you have diabetes, check with your doctor or health care professional before you change your diet or the dose of your diabetic medicine. Testosterone injections are not commonly used in women. Women should inform their doctor if they wish to become pregnant or think they might be pregnant. There is a potential for serious side effects to an unborn child. Talk to your health care professional or pharmacist for more  information. Talk with your doctor or health care professional about your birth control options while taking this medicine. This drug is banned from use in athletes by most athletic organizations. What side effects may I notice from receiving this medicine? Side effects that you should report to your doctor or health care professional as soon as possible: -allergic reactions like skin rash, itching or hives, swelling of the face, lips, or tongue -breast enlargement -breathing problems -changes in emotions or moods -deep or hoarse voice -irregular menstrual periods -signs and symptoms of liver injury like dark yellow or brown urine; general ill feeling or flu-like symptoms; light-colored stools; loss of appetite; nausea; right upper belly pain; unusually weak or tired; yellowing of the eyes or skin -stomach pain -swelling of the ankles, feet, hands -too frequent or persistent erections -trouble passing urine or change in the amount of urine Side effects that usually do not require medical attention (report to your doctor or health care professional if they continue or are bothersome): -acne -change in sex drive or performance -facial hair growth -hair loss -headache This list may not describe all possible side effects. Call your doctor for medical advice about side effects. You may report side effects to FDA at 1-800-FDA-1088. Where should I keep my medicine? Keep out of the reach of children. This medicine can be abused. Keep your medicine in  a safe place to protect it from theft. Do not share this medicine with anyone. Selling or giving away this medicine is dangerous and against the law. Store at room temperature between 20 and 25 degrees C (68 and 77 degrees F). Do not freeze. Protect from light. Follow the directions for the product you are prescribed. Throw away any unused medicine after the expiration date. NOTE: This sheet is a summary. It may not cover all possible information. If  you have questions about this medicine, talk to your doctor, pharmacist, or health care provider.  2019 Elsevier/Gold Standard (2015-08-17 07:33:55)

## 2018-09-07 NOTE — Telephone Encounter (Signed)
Rx written and in Lisa's box.  

## 2018-09-07 NOTE — Progress Notes (Signed)
   09/07/2018 9:13 AM   [name] 1945/11/03 [id number]  Reason for visit: Follow up low testosterone, ED  HPI: I saw Aaron Mitchell in urology clinic in follow-up for low testosterone and erectile dysfunction.  He was previously followed by Dr. [name].  He is a 73 year old male with morbid obesity and no cardiac history who has been on testosterone replacement with good results.  Is here for lab check today.  He is on 0.5 mL injection (100 mg total) testosterone cypionate weekly.  He also takes sildenafil occasionally for erectile dysfunction with good results.  He was previously on a nitroglycerin patch for an Achilles injury.  He denies any chest pain or shortness of breath.  Labs today show hematocrit of 49.9, PSA 0.9, testosterone 1042.  We discussed the risks and benefits at length of testosterone replacement, especially in the setting of his obesity.  We discussed the risks of stroke, cardiac events, and even death, and he would like to continue on testosterone.  RTC 6 months for labs with testosterone and hematocrit, call with results  A total of 15 minutes were spent face-to-face with the patient, greater than 50% was spent in patient education, counseling, and coordination of care regarding testosterone replacement and erectile dysfunction.   Billey Co, [facility] [address] [place], [place] [address] [phone]

## 2018-09-08 ENCOUNTER — Ambulatory Visit: Payer: Self-pay | Admitting: Urology

## 2018-09-09 ENCOUNTER — Other Ambulatory Visit: Payer: Self-pay | Admitting: Family Medicine

## 2018-09-14 ENCOUNTER — Telehealth: Payer: Self-pay

## 2018-09-14 MED ORDER — LOSARTAN POTASSIUM-HCTZ 100-25 MG PO TABS: 0.5000 | ORAL_TABLET | Freq: Every day | ORAL | 0 refills | Status: DC

## 2018-09-14 NOTE — Telephone Encounter (Signed)
[place] left v/m; losartan HCTZ 50-12.5 mg is on long term back order. [facility] has Losartan HCTZ 100-25 mg in stock and can new rx for Losartan HCTZ 100-25 with instructions taking 1/2 tab daily. Dr [name] is out of office.Please advise.

## 2018-09-14 NOTE — Telephone Encounter (Signed)
Please change to the 100-25 dose at 1/2 daily #45  0 refills  Thanks  Let him know directions are changing

## 2018-09-14 NOTE — Telephone Encounter (Signed)
Rx sent and pt notified.

## 2018-09-20 NOTE — Telephone Encounter (Signed)
Patient called .  He said [facility] did receive the rx, but the rx was originally written over 6 months ago. For Medicare they need a new rx and doctor notes explaining why he needs CPAP. Patient said if he needs to be seen, he'll come in for an appointment.

## 2018-09-20 NOTE — Progress Notes (Signed)
Dr. Frederico Hamman T. Hersey Maclellan, MD, [facility] [place], [address] Phone: [phone] Fax: [phone]  09/21/2018  Patient: Aaron Mitchell, MRN: [id number], DOB: 1946/03/17, 73 y.o.  Primary Physician:  [name], MD   Chief Complaint  Patient presents with  . Shoulder Pain    Bilateral injections   Subjective:   Aaron Mitchell is a 73 y.o. very pleasant male patient who presents with the following:  H/o multiple MSK complaints, OA, 20 year history of shoulder pain. He seen many providers including Dr. Enrigue Catena.  I last injected his shoulders > 1 year ago. 08/18/2017  Body mass index is 42.15 kg/m.   He has generally been doing fairly well.  He does have intermittent chronic pains off and on and at multiple joints.  He does have a BMI of 42, and he does not exercise at all.  Past Medical History, Surgical History, Social History, Family History, Problem List, Medications, and Allergies have been reviewed and updated if relevant.  Patient Active Problem List   Diagnosis Date Noted  . Viral URI 07/21/2018  . Cough due to ACE inhibitor 06/15/2018  . Bilateral hip pain 06/09/2018  . Wound of skin 04/04/2018  . Background diabetic retinopathy ([facility]) 08/17/2017  . Greater trochanteric bursitis of right hip 07/30/2017  . Health maintenance examination 07/14/2017  . Advanced care planning/counseling discussion 07/14/2017  . Stage 3 severe COPD by GOLD classification ([facility]) 03/31/2017  . Chest congestion 12/08/2016  . Moderate mitral insufficiency 09/14/2016  . Male hypogonadism   . Insomnia 05/12/2016  . OSA (obstructive sleep apnea) 05/12/2016  . Chronic back pain   . Degenerative disc disease, lumbar   . Left Achilles tendinitis 10/04/2015  . HLD (hyperlipidemia) 09/17/2015  . ED (erectile dysfunction) of organic origin 03/25/2015  . CMC arthritis, thumb, degenerative 01/19/2013  . Triggering of digit  01/19/2013  . Rotator cuff impingement syndrome 10/20/2011  . Chronic gouty arthropathy 04/02/2011  . Severe obesity (BMI 35.0-39.9) with comorbidity ([facility]) 04/02/2011  . Osteoarthritis 04/02/2011  . Allergic rhinitis, seasonal 04/02/2011  . Chronic shoulder pain 04/02/2011  . Prediabetes 03/10/2011  . Essential (primary) hypertension 03/10/2011    Past Medical History:  Diagnosis Date  . Achilles tendinitis    Left leg  . Borderline diabetes mellitus   . Childhood asthma   . Degenerative disc disease, lumbar    rec against surgery  . Erectile dysfunction   . GI bleed 12/2015   hospitalization  . Gout   . Hypertension   . Ileus ([facility]) 12/2015   hospitalization   . Male hypogonadism    followed by urology on T injections ([facility])  . Seasonal allergies    pollen  . Severe obesity (BMI >= 40) ([facility])   . Shoulder bursitis   . Sleep apnea    CPAP    Past Surgical History:  Procedure Laterality Date  . ADENOIDECTOMY    . CARPAL TUNNEL RELEASE Right 2015  . CATARACT EXTRACTION W/PHACO Right 07/15/2016   Procedure: CATARACT EXTRACTION PHACO AND INTRAOCULAR LENS PLACEMENT (IOC);  Surgeon: Leandrew Koyanagi, MD;  Location: [facility];  Service: Ophthalmology;  Laterality: Right;  TORIC sleep apnea  . CATARACT EXTRACTION W/PHACO Left 08/05/2016   Procedure: CATARACT EXTRACTION PHACO AND INTRAOCULAR LENS PLACEMENT (IOC) toric;  Surgeon: Leandrew Koyanagi, MD;  Location: [facility];  Service: Ophthalmology;  Laterality: Left;  left sleep apnea toric  . COLONOSCOPY  10/2012   rpt  5 yrs ([facility])  . EYE SURGERY     Lens replacement 07/12/2016,08/05/2016  . LUMBAR EPIDURAL INJECTION Bilateral 04/2017, 12/2017   S1 transforaminal ESI ([place])  . RHINOPLASTY    . TONSILLECTOMY  ?1954  . TRIGGER FINGER RELEASE Right 11/08/13   4th    Social History   Socioeconomic History  . Marital status: Married    Spouse name: Not on file  . Number of children: Not on  file  . Years of education: Not on file  . Highest education level: Not on file  Occupational History  . Not on file  Social Needs  . Financial resource strain: Not on file  . Food insecurity:    Worry: Not on file    Inability: Not on file  . Transportation needs:    Medical: Not on file    Non-medical: Not on file  Tobacco Use  . Smoking status: Former Smoker    Last attempt to quit: 07/27/1978    Years since quitting: 40.1  . Smokeless tobacco: Never Used  . Tobacco comment: Quit in November 09, 1978  Substance and Sexual Activity  . Alcohol use: Yes    Alcohol/week: 5.0 standard drinks    Types: 5 Shots of liquor per week    Comment:    . Drug use: No  . Sexual activity: Not on file  Lifestyle  . Physical activity:    Days per week: Not on file    Minutes per session: Not on file  . Stress: Not on file  Relationships  . Social connections:    Talks on phone: Not on file    Gets together: Not on file    Attends religious service: Not on file    Active member of club or organization: Not on file    Attends meetings of clubs or organizations: Not on file    Relationship status: Not on file  . Intimate partner violence:    Fear of current or ex partner: Not on file    Emotionally abused: Not on file    Physically abused: Not on file    Forced sexual activity: Not on file  Other Topics Concern  . Not on file  Social History Narrative   Lives at home with wife [name]), dog passed away 11-09-2015   1 grown child   Occ: retired Clinical biochemist, was Chief Technology Officer    Edu: 2 yrs college   Activity: no regular exercise   Diet: good water, fruits daily, splenda iced tea    Family History  Problem Relation Age of Onset  . Alcohol abuse Father   . Hypertension Father   . AVM Father 45       hemorrhagic stroke  . Diabetes Paternal Grandmother   . Cancer Neg Hx   . CAD Neg Hx   . Prostate cancer Neg Hx   . Bladder Cancer Neg Hx   . Kidney cancer Neg Hx     Allergies  Allergen  Reactions  . Codeine Other (See Comments)    Other reaction(s): Other (See Comments) HEADACHES Causes headaches when he stops liquid Codeine-based products  . Ace Inhibitors Cough    Medication list reviewed and updated in full in [facility].  GEN: No fevers, chills. Nontoxic. Primarily MSK c/o today. MSK: Detailed in the HPI GI: tolerating PO intake without difficulty Neuro: No numbness, parasthesias, or tingling associated. Otherwise the pertinent positives of the ROS are noted above.   Objective:   BP 140/80  Pulse 67   Temp 98.2 F (36.8 C) (Oral)   Ht 5' 10.5" (1.791 m)   Wt 298 lb (135.2 kg)   SpO2 99%   BMI 42.15 kg/m    GEN: Well-developed,well-nourished,in no acute distress; alert,appropriate and cooperative throughout examination HEENT: Normocephalic and atraumatic without obvious abnormalities. Ears, externally no deformities PULM: Breathing comfortably in no respiratory distress EXT: No clubbing, cyanosis, or edema PSYCH: Normally interactive. Cooperative during the interview. Pleasant. Friendly and conversant. Not anxious or depressed appearing. Normal, full affect.  Shoulder: B Inspection: No muscle wasting or winging Ecchymosis/edema: neg  AC joint, scapula, clavicle: NT Cervical spine: NT, full ROM Spurling's: neg Abduction: full, 5/5 Flexion: full, 5/5 IR, full, lift-off: 5/5 ER at neutral: full, 5/5 AC crossover: neg Neer: pos Hawkins: pos Drop Test: neg Empty Can: pos Supraspinatus insertion: mild-mod T Bicipital groove: NT Speed's: + Yergason's: neg Sulcus sign: neg Scapular dyskinesis: none C5-T1 intact  Neuro: Sensation intact Grip 5/5   Radiology: No results found.  Assessment and Plan:   Chronic pain of both shoulders  Subacromial bursitis of both shoulders - Plan: methylPREDNISolone acetate (DEPO-MEDROL) injection 80 mg, methylPREDNISolone acetate (DEPO-MEDROL) injection 80 mg  Impingement syndrome of shoulder  region, unspecified laterality  Rotator cuff impingement syndrome, unspecified laterality  Chronic shoulder pain with chronic impingement and bursitis and tendinopathy.  Challenging case in a patient who has BMI 42 and is minimally active.  Strongly recommended increased physical activity, and I think if he increase the functionality of his shoulder girdle and scapular stabilizers, then decrease of symptoms would be dramatically more likely.  SubAC Injection, R Date of procedure: 09/21/2018 Verbal consent was obtained from the patient. Risks (including rare infection), benefits, and alternatives were explained. Patient prepped with Chloraprep and Ethyl Chloride used for anesthesia. The subacromial space was injected using the posterior approach. The patient tolerated the procedure well and had decreased pain post injection. No complications. Injection: 8 cc of Lidocaine 1% and 2 mL of Depo-Medrol 40 mg. Needle: 22 gauge Medication: 2 mL of Depo-Medrol 40 mg, equaling Depo-Medrol 80 mg total   SubAC Injection, L Date of procedure: 09/21/2018 Verbal consent was obtained from the patient. Risks (including rare infection), benefits, and alternatives were explained. Patient prepped with Chloraprep and Ethyl Chloride used for anesthesia. The subacromial space was injected using the posterior approach. The patient tolerated the procedure well and had decreased pain post injection. No complications. Injection: 8 cc of Lidocaine 1% and 2 mL of Depo-Medrol 40 mg. Needle: 22 gauge Medication: 2 mL of Depo-Medrol 40 mg, equaling Depo-Medrol 80 mg total   Follow-up: prn  Meds ordered this encounter  Medications  . methylPREDNISolone acetate (DEPO-MEDROL) injection 80 mg  . methylPREDNISolone acetate (DEPO-MEDROL) injection 80 mg   Signed,  Serenidy Waltz T. Refugio Vandevoorde, MD   Outpatient Encounter Medications as of 09/21/2018  Medication Sig  . albuterol (PROVENTIL HFA;VENTOLIN HFA) 108 (90 Base) MCG/ACT  inhaler Inhale 2 puffs into the lungs every 6 (six) hours as needed for wheezing or shortness of breath.  [name] Kitchen albuterol (PROVENTIL) (2.5 MG/3ML) 0.083% nebulizer solution Take 3 mLs (2.5 mg total) by nebulization every 6 (six) hours as needed for wheezing or shortness of breath.  . allopurinol (ZYLOPRIM) 300 MG tablet TAKE 1 TABLET(300 MG) BY MOUTH DAILY  . amLODipine (NORVASC) 5 MG tablet TAKE 1 TABLET(5 MG) BY MOUTH TWICE DAILY  . atorvastatin (LIPITOR) 40 MG tablet Take 1 tablet (40 mg total) by mouth daily.  . diclofenac (VOLTAREN) 75  MG EC tablet TAKE 1 TABLET BY MOUTH TWICE DAILY  . diclofenac sodium (VOLTAREN) 1 % GEL Apply 2 g topically daily as needed. Apply to both hands.  . fluticasone (FLONASE) 50 MCG/ACT nasal spray Place 2 sprays into both nostrils daily.  [name] Kitchen losartan-hydrochlorothiazide (HYZAAR) 100-25 MG tablet Take 0.5 tablets by mouth daily.  . Magnesium 500 MG TABS Take 0.5-1 tablets (250-500 mg total) by mouth every Monday, Wednesday, and Friday.  . metaxalone (SKELAXIN) 800 MG tablet Take 1 tablet (800 mg total) by mouth 3 (three) times daily as needed for muscle spasms.  . Multiple Vitamins tablet Take 1 tablet by mouth daily.  . naproxen sodium (ANAPROX) 220 MG tablet Take 440 mg by mouth daily.   . sildenafil (VIAGRA) 100 MG tablet Take by mouth.  . tadalafil (CIALIS) 5 MG tablet Take by mouth.  . testosterone cypionate (DEPOTESTOSTERONE CYPIONATE) 200 MG/ML injection Inject 0.5 mLs (100 mg total) into the muscle once a week.  . Tiotropium Bromide Monohydrate (SPIRIVA RESPIMAT) 2.5 MCG/ACT AERS Inhale 2 puffs into the lungs daily.  . traMADol (ULTRAM) 50 MG tablet TAKE 1 TABLET BY MOUTH EVERY 8 HOURS AS NEEDED FOR MODERATE PAIN  . zolpidem (AMBIEN) 10 MG tablet Take 1 tablet (10 mg total) by mouth at bedtime as needed. For sleep.  . [DISCONTINUED] HYDROcodone-homatropine (HYCODAN) 5-1.5 MG/5ML syrup Take 5 mLs by mouth 2 (two) times daily as needed for cough (sedation  precautions).  . [DISCONTINUED] nitroGLYCERIN (NITRODUR - DOSED IN MG/24 HR) 0.2 mg/hr patch USE 1/2 PATCH DAILY  . [EXPIRED] methylPREDNISolone acetate (DEPO-MEDROL) injection 80 mg   . [EXPIRED] methylPREDNISolone acetate (DEPO-MEDROL) injection 80 mg    No facility-administered encounter medications on file as of 09/21/2018.

## 2018-09-21 ENCOUNTER — Ambulatory Visit (INDEPENDENT_AMBULATORY_CARE_PROVIDER_SITE_OTHER): Payer: PPO | Admitting: Family Medicine

## 2018-09-21 ENCOUNTER — Encounter: Payer: Self-pay | Admitting: Family Medicine

## 2018-09-21 VITALS — BP 140/80 | HR 67 | Temp 98.2°F | Ht 70.5 in | Wt 298.0 lb

## 2018-09-21 DIAGNOSIS — M25511 Pain in right shoulder: Secondary | ICD-10-CM | POA: Diagnosis not present

## 2018-09-21 DIAGNOSIS — M7551 Bursitis of right shoulder: Secondary | ICD-10-CM | POA: Diagnosis not present

## 2018-09-21 DIAGNOSIS — M7540 Impingement syndrome of unspecified shoulder: Secondary | ICD-10-CM | POA: Diagnosis not present

## 2018-09-21 DIAGNOSIS — M7552 Bursitis of left shoulder: Secondary | ICD-10-CM

## 2018-09-21 DIAGNOSIS — M25512 Pain in left shoulder: Secondary | ICD-10-CM

## 2018-09-21 DIAGNOSIS — G8929 Other chronic pain: Secondary | ICD-10-CM | POA: Diagnosis not present

## 2018-09-21 MED ORDER — METHYLPREDNISOLONE ACETATE 40 MG/ML IJ SUSP
80.0000 mg | Freq: Once | INTRAMUSCULAR | Status: AC
  Administered 2018-09-21: 80 mg via INTRA_ARTICULAR

## 2018-09-23 NOTE — Telephone Encounter (Addendum)
Latest note printed, CPAP Rx written, both in Lisa's box.

## 2018-09-23 NOTE — Telephone Encounter (Signed)
Faxed all info to [facility] at [phone].

## 2018-09-28 ENCOUNTER — Telehealth: Payer: Self-pay | Admitting: Family Medicine

## 2018-09-28 NOTE — Telephone Encounter (Signed)
Spoke with pt notifying him the form was faxed to [facility].  States he has already heard from them letting him know they received it and everything is in process. Expresses his thanks for our help.

## 2018-09-28 NOTE — Telephone Encounter (Signed)
Best number [phone] Pt called wanting to talk to you regarding cpap supplies  Fax [phone] to  [facility] They need Standard information Copy of sleep study/ office notes/ rx for deposable parts  Please call pt when you have a minutee

## 2018-09-30 DIAGNOSIS — G4733 Obstructive sleep apnea (adult) (pediatric): Secondary | ICD-10-CM | POA: Diagnosis not present

## 2018-10-12 ENCOUNTER — Other Ambulatory Visit: Payer: Self-pay

## 2018-10-12 DIAGNOSIS — M48062 Spinal stenosis, lumbar region with neurogenic claudication: Secondary | ICD-10-CM | POA: Diagnosis not present

## 2018-10-12 DIAGNOSIS — M5416 Radiculopathy, lumbar region: Secondary | ICD-10-CM | POA: Diagnosis not present

## 2018-10-14 ENCOUNTER — Other Ambulatory Visit: Payer: Self-pay | Admitting: Family Medicine

## 2018-10-14 NOTE — Telephone Encounter (Signed)
Name of Medication: Ambien Name of Pharmacy: [facility] Written Date and Quantity: 05/06/17, #30 Last Office Visit and Type: 09/21/18, chronic pain w/ Dr. Lorelei Pont Next Office Visit and Type: 07/27/19, CPE Pt 2 Last Controlled Substance Agreement Date: none Last UDS: none

## 2018-10-14 NOTE — Telephone Encounter (Signed)
Best number  [phone] Pt called to get a refill on zolpedim  This has not been refilled since 2018 Doesn't take very often  [facility] [address] [place]

## 2018-10-16 MED ORDER — ZOLPIDEM TARTRATE 10 MG PO TABS: 10.0000 mg | ORAL_TABLET | Freq: Every evening | ORAL | 0 refills | Status: DC | PRN

## 2018-10-16 NOTE — Telephone Encounter (Signed)
Eprescribed.

## 2018-10-16 NOTE — Addendum Note (Signed)
Addended by: [name] on: 10/16/2018 07:50 PM   Modules accepted: Orders

## 2018-10-27 ENCOUNTER — Other Ambulatory Visit: Payer: Self-pay | Admitting: Family Medicine

## 2018-10-30 NOTE — Progress Notes (Signed)
Aaron Mott T. Shandy Checo, MD Primary Care and [place] at [facility] [place], [address] Phone: [phone]  FAX: [phone]  Aaron Mitchell - 73 y.o. male  MRN [id number]  Date of Birth: [birth year]  Visit Date: 10/31/2018  PCP: [name], MD  Referred by: [name], MD  Chief Complaint  Patient presents with  . Hip Pain    Bilateral   Subjective:   Aaron Mitchell is a 73 y.o. very pleasant male patient who presents with the following:  He is known patient with some intermittent chronic pain issues who has recurrent GTB.  I last injected his hips approximately 6 months ago.  He is here today for recurrent lateral hip pain.   Body mass index is 40.85 kg/m.   He has lost 10 pounds.  I reviewed his hip x-rays again today independently, and he does have some mild degenerative changes only. No fractures or dislocations.  OA bilaterally. Electronically Signed  By: Owens Loffler, MD On: 10/31/2018 9:43 AM   Past Medical History, Surgical History, Social History, Family History, Problem List, Medications, and Allergies have been reviewed and updated if relevant.  Patient Active Problem List   Diagnosis Date Noted  . Viral URI 07/21/2018  . Cough due to ACE inhibitor 06/15/2018  . Bilateral hip pain 06/09/2018  . Wound of skin 04/04/2018  . Background diabetic retinopathy ([place]) 08/17/2017  . Greater trochanteric bursitis of right hip 07/30/2017  . Health maintenance examination 07/14/2017  . Advanced care planning/counseling discussion 07/14/2017  . Stage 3 severe COPD by GOLD classification ([place]) 03/31/2017  . Chest congestion 12/08/2016  . Moderate mitral insufficiency 09/14/2016  . Male hypogonadism   . Insomnia 05/12/2016  . OSA (obstructive sleep apnea) 05/12/2016  . Chronic back pain   . Degenerative disc disease, lumbar   . Left Achilles tendinitis 10/04/2015  . HLD (hyperlipidemia)  09/17/2015  . ED (erectile dysfunction) of organic origin 03/25/2015  . CMC arthritis, thumb, degenerative 01/19/2013  . Triggering of digit 01/19/2013  . Rotator cuff impingement syndrome 10/20/2011  . Chronic gouty arthropathy 04/02/2011  . Severe obesity (BMI 35.0-39.9) with comorbidity ([place]) 04/02/2011  . Osteoarthritis 04/02/2011  . Allergic rhinitis, seasonal 04/02/2011  . Chronic shoulder pain 04/02/2011  . Prediabetes 03/10/2011  . Essential (primary) hypertension 03/10/2011    Past Medical History:  Diagnosis Date  . Achilles tendinitis    Left leg  . Borderline diabetes mellitus   . Childhood asthma   . Degenerative disc disease, lumbar    rec against surgery  . Erectile dysfunction   . GI bleed 12/2015   hospitalization  . Gout   . Hypertension   . Ileus ([place]) 12/2015   hospitalization   . Male hypogonadism    followed by urology on T injections ([place])  . Seasonal allergies    pollen  . Severe obesity (BMI >= 40) (HCC)   . Shoulder bursitis   . Sleep apnea    CPAP    Past Surgical History:  Procedure Laterality Date  . ADENOIDECTOMY    . CARPAL TUNNEL RELEASE Right 2015  . CATARACT EXTRACTION W/PHACO Right 07/15/2016   Procedure: CATARACT EXTRACTION PHACO AND INTRAOCULAR LENS PLACEMENT (IOC);  Surgeon: Leandrew Koyanagi, MD;  Location: [facility];  Service: Ophthalmology;  Laterality: Right;  TORIC sleep apnea  . CATARACT EXTRACTION W/PHACO Left 08/05/2016   Procedure: CATARACT EXTRACTION PHACO AND INTRAOCULAR LENS PLACEMENT ([place]) toric;  Surgeon: Leandrew Koyanagi,  MD;  Location: [facility];  Service: Ophthalmology;  Laterality: Left;  left sleep apnea toric  . COLONOSCOPY  10/2012   rpt 5 yrs ([facility])  . EYE SURGERY     Lens replacement 07/12/2016,08/05/2016  . LUMBAR EPIDURAL INJECTION Bilateral 04/2017, 12/2017   S1 transforaminal ESI ([place])  . RHINOPLASTY    . TONSILLECTOMY  ?1954  . TRIGGER FINGER RELEASE Right 10-31-2013    4th    Social History   Socioeconomic History  . Marital status: Married    Spouse name: Not on file  . Number of children: Not on file  . Years of education: Not on file  . Highest education level: Not on file  Occupational History  . Not on file  Social Needs  . Financial resource strain: Not on file  . Food insecurity:    Worry: Not on file    Inability: Not on file  . Transportation needs:    Medical: Not on file    Non-medical: Not on file  Tobacco Use  . Smoking status: Former Smoker    Last attempt to quit: 07/27/1978    Years since quitting: 40.2  . Smokeless tobacco: Never Used  . Tobacco comment: Quit in 1978-11-01  Substance and Sexual Activity  . Alcohol use: Yes    Alcohol/week: 5.0 standard drinks    Types: 5 Shots of liquor per week    Comment:    . Drug use: No  . Sexual activity: Not on file  Lifestyle  . Physical activity:    Days per week: Not on file    Minutes per session: Not on file  . Stress: Not on file  Relationships  . Social connections:    Talks on phone: Not on file    Gets together: Not on file    Attends religious service: Not on file    Active member of club or organization: Not on file    Attends meetings of clubs or organizations: Not on file    Relationship status: Not on file  . Intimate partner violence:    Fear of current or ex partner: Not on file    Emotionally abused: Not on file    Physically abused: Not on file    Forced sexual activity: Not on file  Other Topics Concern  . Not on file  Social History Narrative   Lives at home with wife [name]), dog passed away 11/01/2015   1 grown child   Occ: retired Clinical biochemist, was Chief Technology Officer    Edu: 2 yrs college   Activity: no regular exercise   Diet: good water, fruits daily, splenda iced tea    Family History  Problem Relation Age of Onset  . Alcohol abuse Father   . Hypertension Father   . AVM Father 5       hemorrhagic stroke  . Diabetes Paternal Grandmother   .  Cancer Neg Hx   . CAD Neg Hx   . Prostate cancer Neg Hx   . Bladder Cancer Neg Hx   . Kidney cancer Neg Hx     Allergies  Allergen Reactions  . Codeine Other (See Comments)    Other reaction(s): Other (See Comments) HEADACHES Causes headaches when he stops liquid Codeine-based products  . Ace Inhibitors Cough    Medication list reviewed and updated in full in [facility].  GEN: No fevers, chills. Nontoxic. Primarily MSK c/o today. MSK: Detailed in the HPI GI: tolerating PO intake without  difficulty Neuro: No numbness, parasthesias, or tingling associated. Otherwise the pertinent positives of the ROS are noted above.   Objective:   BP 140/78   Pulse 76   Temp 98.7 F (37.1 C) (Oral)   Ht 5' 10.5" (1.791 m)   Wt 288 lb 12 oz (131 kg)   BMI 40.85 kg/m    GEN: WDWN, NAD, Non-toxic, Alert & Oriented x 3 HEENT: Atraumatic, Normocephalic.  Ears and Nose: No external deformity. EXTR: No clubbing/cyanosis/edema NEURO: Normal gait.  PSYCH: Normally interactive. Conversant. Not depressed or anxious appearing.  Calm demeanor.   HIP EXAM: SIDE: b ROM: Abduction, Flexion, Internal and External range of motion: Full Pain with terminal IROM and EROM: minimal GTB: B notably ttp SLR: NEG Knees: No effusion FABER: NT REVERSE FABER: NT, neg Piriformis: NT at direct palpation Str: flexion: 5/5 abduction: 5/5 adduction: 5/5 Strength testing non-tender  Radiology: No results found.  Assessment and Plan:   Trochanteric bursitis of both hips  Morbid obesity (HCC)  Ongoing intermittent chronic pain of the trochanteric bursa bilaterally.  Minimal arthritic changes in the intra-articular space, and he is not having any groin pain currently.  We will do bilateral hip injections today for symptomatic relief since he has been failing other conservative measures. Good response in the past.   Aspiration/Injection Procedure Note Aaron Mitchell 1945/10/17 Date of procedure:  10/31/2018  Procedure: Large Joint Aspiration / Injection of Hip, Trochanteric Bursa, R Indications: Pain  Procedure Details Verbal consent obtained. Risks (including infection, potential atrophy), benefits, and alternatives reviewed. Greater trochanter sterilely prepped with Chloraprep. Ethyl Chloride used for anesthesia. 8 cc of Lidocaine 1% injected with 2 mL of Depo-Medrol 40 mg into trochanteric bursa at area of maximal tenderness at greater trochanter. Needle taken to bone to troch bursa, flows easily. Bursa massaged. No bleeding and no complications. Decreased pain after injection. Needle: 22 gauge spinal needle Medication: 2 mL of Depo-Medrol 40 mg, equaling Depo-Medrol 80 mg total   Aspiration/Injection Procedure Note Aaron Mitchell 1946/06/23 Date of procedure: 10/31/2018  Procedure: Large Joint Aspiration / Injection of Hip, Trochanteric Bursa, L Indications: Pain  Procedure Details Verbal consent obtained. Risks (including infection, potential atrophy), benefits, and alternatives reviewed. Greater trochanter sterilely prepped with Chloraprep. Ethyl Chloride used for anesthesia. 8 cc of Lidocaine 1% injected with 2 mL of Depo-Medrol 40 mg into trochanteric bursa at area of maximal tenderness at greater trochanter. Needle taken to bone to troch bursa, flows easily. Bursa massaged. No bleeding and no complications. Decreased pain after injection. Needle: 22 gauge spinal needle Medication: 2 mL of Depo-Medrol 40 mg, equaling Depo-Medrol 80 mg total  Follow-up: prn  Signed,  Shanetta Nicolls T. Gioia Ranes, MD   Outpatient Encounter Medications as of 10/31/2018  Medication Sig  . albuterol (PROVENTIL HFA;VENTOLIN HFA) 108 (90 Base) MCG/ACT inhaler Inhale 2 puffs into the lungs every 6 (six) hours as needed for wheezing or shortness of breath.  [name] Kitchen albuterol (PROVENTIL) (2.5 MG/3ML) 0.083% nebulizer solution Take 3 mLs (2.5 mg total) by nebulization every 6 (six) hours as needed for wheezing or  shortness of breath.  . allopurinol (ZYLOPRIM) 300 MG tablet TAKE 1 TABLET(300 MG) BY MOUTH DAILY  . amLODipine (NORVASC) 5 MG tablet TAKE 1 TABLET(5 MG) BY MOUTH TWICE DAILY  . atorvastatin (LIPITOR) 40 MG tablet TAKE 1 TABLET(40 MG) BY MOUTH DAILY  . diclofenac (VOLTAREN) 75 MG EC tablet TAKE 1 TABLET BY MOUTH TWICE DAILY  . diclofenac sodium (VOLTAREN) 1 % GEL Apply 2 g topically  daily as needed. Apply to both hands.  . fluticasone (FLONASE) 50 MCG/ACT nasal spray SHAKE LIQUID AND USE 2 SPRAYS IN EACH NOSTRIL DAILY  . losartan-hydrochlorothiazide (HYZAAR) 100-25 MG tablet Take 0.5 tablets by mouth daily. (Patient taking differently: Take 1 tablet by mouth daily. )  . Magnesium 500 MG TABS Take 0.5-1 tablets (250-500 mg total) by mouth every Monday, Wednesday, and Friday.  . metaxalone (SKELAXIN) 800 MG tablet Take 1 tablet (800 mg total) by mouth 3 (three) times daily as needed for muscle spasms.  . Multiple Vitamins tablet Take 1 tablet by mouth daily.  . naproxen sodium (ANAPROX) 220 MG tablet Take 440 mg by mouth daily.   . sildenafil (VIAGRA) 100 MG tablet Take by mouth.  . tadalafil (CIALIS) 5 MG tablet Take by mouth.  . testosterone cypionate (DEPOTESTOSTERONE CYPIONATE) 200 MG/ML injection Inject 0.5 mLs (100 mg total) into the muscle once a week.  . Tiotropium Bromide Monohydrate (SPIRIVA RESPIMAT) 2.5 MCG/ACT AERS Inhale 2 puffs into the lungs daily.  . traMADol (ULTRAM) 50 MG tablet TAKE 1 TABLET BY MOUTH EVERY 8 HOURS AS NEEDED FOR MODERATE PAIN  . zolpidem (AMBIEN) 10 MG tablet Take 1 tablet (10 mg total) by mouth at bedtime as needed. For sleep.   No facility-administered encounter medications on file as of 10/31/2018.

## 2018-10-31 ENCOUNTER — Encounter: Payer: Self-pay | Admitting: Family Medicine

## 2018-10-31 ENCOUNTER — Other Ambulatory Visit: Payer: Self-pay

## 2018-10-31 ENCOUNTER — Ambulatory Visit (INDEPENDENT_AMBULATORY_CARE_PROVIDER_SITE_OTHER): Payer: PPO | Admitting: Family Medicine

## 2018-10-31 VITALS — BP 140/78 | HR 76 | Temp 98.7°F | Ht 70.5 in | Wt 288.8 lb

## 2018-10-31 DIAGNOSIS — M7061 Trochanteric bursitis, right hip: Secondary | ICD-10-CM | POA: Diagnosis not present

## 2018-10-31 DIAGNOSIS — M7062 Trochanteric bursitis, left hip: Secondary | ICD-10-CM

## 2018-10-31 MED ORDER — METHYLPREDNISOLONE ACETATE 40 MG/ML IJ SUSP
80.0000 mg | Freq: Once | INTRAMUSCULAR | Status: AC
  Administered 2018-10-31: 80 mg via INTRA_ARTICULAR

## 2018-10-31 MED ORDER — METHYLPREDNISOLONE ACETATE 40 MG/ML IJ SUSP
80.0000 mg | Freq: Once | INTRAMUSCULAR | Status: AC
  Administered 2018-10-31: 11:00:00 80 mg via INTRA_ARTICULAR

## 2018-10-31 NOTE — Addendum Note (Signed)
Addended by: Carter Kitten on: 10/31/2018 10:34 AM   Modules accepted: Orders

## 2018-11-24 ENCOUNTER — Encounter: Payer: Self-pay | Admitting: Family Medicine

## 2018-11-25 ENCOUNTER — Telehealth (INDEPENDENT_AMBULATORY_CARE_PROVIDER_SITE_OTHER): Payer: PPO | Admitting: Family Medicine

## 2018-11-25 ENCOUNTER — Other Ambulatory Visit: Payer: Self-pay

## 2018-11-25 ENCOUNTER — Encounter: Payer: Self-pay | Admitting: Family Medicine

## 2018-11-25 VITALS — BP 141/80 | HR 69 | Ht 70.5 in | Wt 285.0 lb

## 2018-11-25 DIAGNOSIS — J449 Chronic obstructive pulmonary disease, unspecified: Secondary | ICD-10-CM

## 2018-11-25 MED ORDER — ALBUTEROL SULFATE HFA 108 (90 BASE) MCG/ACT IN AERS: 2.0000 | INHALATION_SPRAY | Freq: Four times a day (QID) | RESPIRATORY_TRACT | 3 refills | Status: DC | PRN

## 2018-11-25 MED ORDER — TIOTROPIUM BROMIDE MONOHYDRATE 18 MCG IN CAPS: 18.0000 ug | ORAL_CAPSULE | Freq: Every day | RESPIRATORY_TRACT | 3 refills | Status: DC

## 2018-11-25 MED ORDER — FLUTICASONE PROPIONATE 50 MCG/ACT NA SUSP: NASAL | 3 refills | Status: DC

## 2018-11-25 NOTE — Addendum Note (Signed)
Addended by: [name] on: 11/25/2018 02:48 PM   Modules accepted: Orders

## 2018-11-25 NOTE — Assessment & Plan Note (Signed)
Story not consistent with COPD exacerbation. Reviewed controller vs rescue medication and indications for use.  Continue spiriva handihaler 1 capsule daily.  No need for up-titration of treatment regimen  Refilled albuterol inhaler - he mails to pharmacy in [place] for more affordable alternative.

## 2018-11-25 NOTE — Progress Notes (Signed)
Virtual visit attempted through [facility]. Due to national recommendations of social distancing due to [place] 19, a virtual visit is felt to be most appropriate for this patient at this time. Interactive audio and video telecommunications were attempted between myself and Aaron Mitchell, however failed due to patient having technical difficulties. We continued and completed visit with audio only.   Patient location: home Provider location: [place] [facility], office If any vitals were documented, they were collected by patient at home unless specified below.    BP (!) 141/80 (BP Location: Left Wrist, Patient Position: Sitting)   Pulse 69   Ht 5' 10.5" (1.791 m)   Wt 285 lb (129.3 kg)   BMI 40.32 kg/m    CC: dyspnea Subjective:    Patient ID: Aaron Mitchell, male    DOB: 1945/10/23, 73 y.o.   MRN: [id number]  HPI: Aaron Mitchell is a 73 y.o. male presenting on 11/25/2018 for No chief complaint on file.   Known COPD on spiriva handihaler and PRN albuterol neb/inhaler (rare use). Needs alb inh refilled.  Predominant trouble when exposed to pollen/dust - chest tightness, dyspnea.   No significant dyspnea, wheeze, productive cough otherwise at baseline.  Was confused about controller vs rescue medication.       Relevant past medical, surgical, family and social history reviewed and updated as indicated. Interim medical history since our last visit reviewed. Allergies and medications reviewed and updated. Outpatient Medications Prior to Visit  Medication Sig Dispense Refill  . albuterol (PROVENTIL) (2.5 MG/3ML) 0.083% nebulizer solution Take 3 mLs (2.5 mg total) by nebulization every 6 (six) hours as needed for wheezing or shortness of breath. 90 mL 3  . allopurinol (ZYLOPRIM) 300 MG tablet TAKE 1 TABLET(300 MG) BY MOUTH DAILY 90 tablet 3  . amLODipine (NORVASC) 5 MG tablet TAKE 1 TABLET(5 MG) BY MOUTH TWICE DAILY 180 tablet 0  . atorvastatin (LIPITOR) 40 MG tablet TAKE 1 TABLET(40  MG) BY MOUTH DAILY 90 tablet 2  . diclofenac (VOLTAREN) 75 MG EC tablet TAKE 1 TABLET BY MOUTH TWICE DAILY 180 tablet 0  . diclofenac sodium (VOLTAREN) 1 % GEL Apply 2 g topically daily as needed. Apply to both hands.    . fluticasone (FLONASE) 50 MCG/ACT nasal spray SHAKE LIQUID AND USE 2 SPRAYS IN EACH NOSTRIL DAILY 48 g 3  . losartan-hydrochlorothiazide (HYZAAR) 100-25 MG tablet Take 0.5 tablets by mouth daily. (Patient taking differently: Take 1 tablet by mouth daily. ) 45 tablet 0  . Magnesium 500 MG TABS Take 0.5-1 tablets (250-500 mg total) by mouth every Monday, Wednesday, and Friday. 30 tablet   . metaxalone (SKELAXIN) 800 MG tablet Take 1 tablet (800 mg total) by mouth 3 (three) times daily as needed for muscle spasms. 270 tablet 0  . Multiple Vitamins tablet Take 1 tablet by mouth daily.    . naproxen sodium (ANAPROX) 220 MG tablet Take 440 mg by mouth daily.     . sildenafil (VIAGRA) 100 MG tablet Take by mouth.    . tadalafil (CIALIS) 5 MG tablet Take by mouth.    . testosterone cypionate (DEPOTESTOSTERONE CYPIONATE) 200 MG/ML injection Inject 0.5 mLs (100 mg total) into the muscle once a week. 10 mL 0  . tiotropium (SPIRIVA HANDIHALER) 18 MCG inhalation capsule Place 1 capsule (18 mcg total) into inhaler and inhale daily. 90 capsule 3  . traMADol (ULTRAM) 50 MG tablet TAKE 1 TABLET BY MOUTH EVERY 8 HOURS AS NEEDED FOR MODERATE PAIN 30 tablet 0  .  zolpidem (AMBIEN) 10 MG tablet Take 1 tablet (10 mg total) by mouth at bedtime as needed. For sleep. 30 tablet 0  . albuterol (PROVENTIL HFA;VENTOLIN HFA) 108 (90 Base) MCG/ACT inhaler Inhale 2 puffs into the lungs every 6 (six) hours as needed for wheezing or shortness of breath. 3 Inhaler 3   No facility-administered medications prior to visit.      Per HPI unless specifically indicated in ROS section below Review of Systems Objective:    BP (!) 141/80 (BP Location: Left Wrist, Patient Position: Sitting)   Pulse 69   Ht 5' 10.5"  (1.791 m)   Wt 285 lb (129.3 kg)   BMI 40.32 kg/m   Wt Readings from Last 3 Encounters:  11/25/18 285 lb (129.3 kg)  10/31/18 288 lb 12 oz (131 kg)  09/21/18 298 lb (135.2 kg)     Physical exam: Gen: alert, NAD, not ill appearing Pulm: speaks in complete sentences without increased work of breathing Psych: normal mood, normal thought content      Assessment & Plan:   Problem List Items Addressed This Visit    Stage 3 severe COPD by GOLD classification ([place]) - Primary    Story not consistent with COPD exacerbation. Reviewed controller vs rescue medication and indications for use.  Continue spiriva handihaler 1 capsule daily.  No need for up-titration of treatment regimen  Refilled albuterol inhaler - he mails to pharmacy in [place] for more affordable alternative.       Relevant Medications   albuterol (VENTOLIN HFA) 108 (90 Base) MCG/ACT inhaler       Meds ordered this encounter  Medications  . albuterol (VENTOLIN HFA) 108 (90 Base) MCG/ACT inhaler    Sig: Inhale 2 puffs into the lungs every 6 (six) hours as needed for wheezing or shortness of breath.    Dispense:  3 Inhaler    Refill:  3   No orders of the defined types were placed in this encounter.   Follow up plan: No follow-ups on file.  Aaron Bush, MD

## 2018-11-28 ENCOUNTER — Telehealth: Payer: Self-pay

## 2018-11-28 NOTE — Telephone Encounter (Signed)
Left message on vm per dpr asking pt to call back to let us know if he wants his rx mailed to him or will he pick it up from our office.   [Rx in basket on [employer] pending answer from pt.]

## 2018-11-28 NOTE — Telephone Encounter (Signed)
Per Loma Sousa, pt returned call. States he wants to pick up rx and will drive around to the back to have it brought to the car.

## 2018-12-10 ENCOUNTER — Other Ambulatory Visit: Payer: Self-pay | Admitting: Family Medicine

## 2018-12-12 NOTE — Telephone Encounter (Signed)
Refill request for Diclofenac. LOV 07/21/2018 for CPE-[place] seen Dr. Lorelei Pont x 2 for pain issues. Future appointment for CPE on 07/27/2019. Last refilled on 09/09/2018 for #180 with 0 additional refills.

## 2018-12-13 ENCOUNTER — Other Ambulatory Visit: Payer: Self-pay

## 2018-12-14 ENCOUNTER — Other Ambulatory Visit: Payer: Self-pay | Admitting: Urology

## 2018-12-14 MED ORDER — TESTOSTERONE CYPIONATE 200 MG/ML IM SOLN: 100.0000 mg | INTRAMUSCULAR | 0 refills | Status: DC

## 2018-12-14 NOTE — Telephone Encounter (Signed)
This medication was filled by [facility] today. Sent to [facility] in [place]. Please notify patient.

## 2018-12-14 NOTE — Telephone Encounter (Signed)
Pt called office stating his testosterone hasn't been sent to pharmacy.  Please let pt know when this has been sent over so he can pick up.

## 2018-12-15 ENCOUNTER — Encounter: Payer: Self-pay | Admitting: Family Medicine

## 2018-12-15 NOTE — Telephone Encounter (Signed)
Sent a Mychart message informing patient to pick up RX from pharmacy.

## 2019-01-09 DIAGNOSIS — E113212 Type 2 diabetes mellitus with mild nonproliferative diabetic retinopathy with macular edema, left eye: Secondary | ICD-10-CM | POA: Diagnosis not present

## 2019-01-09 DIAGNOSIS — M5413 Radiculopathy, cervicothoracic region: Secondary | ICD-10-CM | POA: Diagnosis not present

## 2019-01-09 DIAGNOSIS — M9903 Segmental and somatic dysfunction of lumbar region: Secondary | ICD-10-CM | POA: Diagnosis not present

## 2019-01-09 DIAGNOSIS — M545 Low back pain: Secondary | ICD-10-CM | POA: Diagnosis not present

## 2019-01-09 DIAGNOSIS — M461 Sacroiliitis, not elsewhere classified: Secondary | ICD-10-CM | POA: Diagnosis not present

## 2019-01-09 DIAGNOSIS — M9901 Segmental and somatic dysfunction of cervical region: Secondary | ICD-10-CM | POA: Diagnosis not present

## 2019-01-09 DIAGNOSIS — M9904 Segmental and somatic dysfunction of sacral region: Secondary | ICD-10-CM | POA: Diagnosis not present

## 2019-01-09 LAB — HM DIABETES EYE EXAM

## 2019-01-10 ENCOUNTER — Other Ambulatory Visit: Payer: Self-pay | Admitting: Family Medicine

## 2019-01-12 DIAGNOSIS — M5413 Radiculopathy, cervicothoracic region: Secondary | ICD-10-CM | POA: Diagnosis not present

## 2019-01-12 DIAGNOSIS — M9901 Segmental and somatic dysfunction of cervical region: Secondary | ICD-10-CM | POA: Diagnosis not present

## 2019-01-12 DIAGNOSIS — M545 Low back pain: Secondary | ICD-10-CM | POA: Diagnosis not present

## 2019-01-12 DIAGNOSIS — M461 Sacroiliitis, not elsewhere classified: Secondary | ICD-10-CM | POA: Diagnosis not present

## 2019-01-12 DIAGNOSIS — M9903 Segmental and somatic dysfunction of lumbar region: Secondary | ICD-10-CM | POA: Diagnosis not present

## 2019-01-12 DIAGNOSIS — M9904 Segmental and somatic dysfunction of sacral region: Secondary | ICD-10-CM | POA: Diagnosis not present

## 2019-01-16 DIAGNOSIS — M9904 Segmental and somatic dysfunction of sacral region: Secondary | ICD-10-CM | POA: Diagnosis not present

## 2019-01-16 DIAGNOSIS — M5413 Radiculopathy, cervicothoracic region: Secondary | ICD-10-CM | POA: Diagnosis not present

## 2019-01-16 DIAGNOSIS — M545 Low back pain: Secondary | ICD-10-CM | POA: Diagnosis not present

## 2019-01-16 DIAGNOSIS — M9903 Segmental and somatic dysfunction of lumbar region: Secondary | ICD-10-CM | POA: Diagnosis not present

## 2019-01-16 DIAGNOSIS — M461 Sacroiliitis, not elsewhere classified: Secondary | ICD-10-CM | POA: Diagnosis not present

## 2019-01-16 DIAGNOSIS — M9901 Segmental and somatic dysfunction of cervical region: Secondary | ICD-10-CM | POA: Diagnosis not present

## 2019-01-19 DIAGNOSIS — M5413 Radiculopathy, cervicothoracic region: Secondary | ICD-10-CM | POA: Diagnosis not present

## 2019-01-19 DIAGNOSIS — M545 Low back pain: Secondary | ICD-10-CM | POA: Diagnosis not present

## 2019-01-19 DIAGNOSIS — M9904 Segmental and somatic dysfunction of sacral region: Secondary | ICD-10-CM | POA: Diagnosis not present

## 2019-01-19 DIAGNOSIS — M9901 Segmental and somatic dysfunction of cervical region: Secondary | ICD-10-CM | POA: Diagnosis not present

## 2019-01-19 DIAGNOSIS — M9903 Segmental and somatic dysfunction of lumbar region: Secondary | ICD-10-CM | POA: Diagnosis not present

## 2019-01-19 DIAGNOSIS — M461 Sacroiliitis, not elsewhere classified: Secondary | ICD-10-CM | POA: Diagnosis not present

## 2019-01-20 ENCOUNTER — Encounter: Payer: Self-pay | Admitting: Family Medicine

## 2019-01-23 DIAGNOSIS — M545 Low back pain: Secondary | ICD-10-CM | POA: Diagnosis not present

## 2019-01-23 DIAGNOSIS — M9903 Segmental and somatic dysfunction of lumbar region: Secondary | ICD-10-CM | POA: Diagnosis not present

## 2019-01-23 DIAGNOSIS — M9904 Segmental and somatic dysfunction of sacral region: Secondary | ICD-10-CM | POA: Diagnosis not present

## 2019-01-23 DIAGNOSIS — M9901 Segmental and somatic dysfunction of cervical region: Secondary | ICD-10-CM | POA: Diagnosis not present

## 2019-01-23 DIAGNOSIS — M5413 Radiculopathy, cervicothoracic region: Secondary | ICD-10-CM | POA: Diagnosis not present

## 2019-01-23 DIAGNOSIS — M461 Sacroiliitis, not elsewhere classified: Secondary | ICD-10-CM | POA: Diagnosis not present

## 2019-01-30 DIAGNOSIS — M9901 Segmental and somatic dysfunction of cervical region: Secondary | ICD-10-CM | POA: Diagnosis not present

## 2019-01-30 DIAGNOSIS — M5413 Radiculopathy, cervicothoracic region: Secondary | ICD-10-CM | POA: Diagnosis not present

## 2019-01-30 DIAGNOSIS — M9904 Segmental and somatic dysfunction of sacral region: Secondary | ICD-10-CM | POA: Diagnosis not present

## 2019-01-30 DIAGNOSIS — M545 Low back pain: Secondary | ICD-10-CM | POA: Diagnosis not present

## 2019-01-30 DIAGNOSIS — M9903 Segmental and somatic dysfunction of lumbar region: Secondary | ICD-10-CM | POA: Diagnosis not present

## 2019-01-30 DIAGNOSIS — M461 Sacroiliitis, not elsewhere classified: Secondary | ICD-10-CM | POA: Diagnosis not present

## 2019-02-01 ENCOUNTER — Ambulatory Visit (INDEPENDENT_AMBULATORY_CARE_PROVIDER_SITE_OTHER): Payer: PPO | Admitting: Family Medicine

## 2019-02-01 ENCOUNTER — Other Ambulatory Visit: Payer: Self-pay

## 2019-02-01 ENCOUNTER — Encounter: Payer: Self-pay | Admitting: Family Medicine

## 2019-02-01 VITALS — BP 150/72 | HR 67 | Temp 98.7°F | Ht 70.5 in | Wt 289.8 lb

## 2019-02-01 DIAGNOSIS — M25561 Pain in right knee: Secondary | ICD-10-CM | POA: Diagnosis not present

## 2019-02-01 DIAGNOSIS — M19012 Primary osteoarthritis, left shoulder: Secondary | ICD-10-CM

## 2019-02-01 DIAGNOSIS — M19011 Primary osteoarthritis, right shoulder: Secondary | ICD-10-CM | POA: Diagnosis not present

## 2019-02-01 MED ORDER — METHYLPREDNISOLONE ACETATE 40 MG/ML IJ SUSP
80.0000 mg | Freq: Once | INTRAMUSCULAR | Status: AC
  Administered 2019-02-01: 80 mg via INTRA_ARTICULAR

## 2019-02-01 MED ORDER — METHYLPREDNISOLONE ACETATE 40 MG/ML IJ SUSP
80.0000 mg | Freq: Once | INTRAMUSCULAR | Status: AC
  Administered 2019-02-01: 11:00:00 80 mg via INTRA_ARTICULAR

## 2019-02-01 NOTE — Progress Notes (Signed)
Aaron Corea T. Tuff Clabo, MD Primary Care and [place] at [facility] [place], [address] Phone: [phone]  FAX: [phone]  Aaron Mitchell - 73 y.o. male  MRN [id number]  Date of Birth: [birth year]  Visit Date: 02/01/2019  PCP: [name], MD  Referred by: [name], MD  Chief Complaint  Patient presents with  . Shoulder Pain    Bilateral injections   Subjective:   Aaron Mitchell is a 73 y.o. very pleasant male patient who presents with the following:  He is a pleasant guy, and he has well-known chronic pain in his bilateral shoulders.  He has multiple pain issues throughout his body.  I have done some shoulder injections on him in the past, with some good relief.  Last time was approximately 5 months ago.  He also recently had a fall and he has some right knee pain and a mild effusion.  He actually fell on the left side on his hip and shoulder, but these are not bothering him at all.  He thinks he possibly could have twisted it.  Past Medical History, Surgical History, Social History, Family History, Problem List, Medications, and Allergies have been reviewed and updated if relevant.  Patient Active Problem List   Diagnosis Date Noted  . Cough due to ACE inhibitor 06/15/2018  . Bilateral hip pain 06/09/2018  . Background diabetic retinopathy ([place]) 08/17/2017  . Greater trochanteric bursitis of right hip 07/30/2017  . Health maintenance examination 07/14/2017  . Advanced care planning/counseling discussion 07/14/2017  . Stage 3 severe COPD by GOLD classification ([place]) 03/31/2017  . Chest congestion 12/08/2016  . Moderate mitral insufficiency 09/14/2016  . Male hypogonadism   . Insomnia 05/12/2016  . OSA (obstructive sleep apnea) 05/12/2016  . Chronic back pain   . Degenerative disc disease, lumbar   . HLD (hyperlipidemia) 09/17/2015  . ED (erectile dysfunction) of organic origin 03/25/2015  .  CMC arthritis, thumb, degenerative 01/19/2013  . Rotator cuff impingement syndrome 10/20/2011  . Chronic gouty arthropathy 04/02/2011  . Severe obesity (BMI 35.0-39.9) with comorbidity ([place]) 04/02/2011  . Osteoarthritis 04/02/2011  . Allergic rhinitis, seasonal 04/02/2011  . Chronic shoulder pain 04/02/2011  . Prediabetes 03/10/2011  . Essential (primary) hypertension 03/10/2011    Past Medical History:  Diagnosis Date  . Achilles tendinitis    Left leg  . Borderline diabetes mellitus   . Childhood asthma   . Degenerative disc disease, lumbar    rec against surgery  . Erectile dysfunction   . GI bleed 12/2015   hospitalization  . Gout   . Hypertension   . Ileus ([place]) 12/2015   hospitalization   . Male hypogonadism    followed by urology on T injections ([place])  . Seasonal allergies    pollen  . Severe obesity (BMI >= 40) ([facility])   . Shoulder bursitis   . Sleep apnea    CPAP    Past Surgical History:  Procedure Laterality Date  . ADENOIDECTOMY    . CARPAL TUNNEL RELEASE Right 2015  . CATARACT EXTRACTION W/PHACO Right 07/15/2016   Procedure: CATARACT EXTRACTION PHACO AND INTRAOCULAR LENS PLACEMENT (IOC);  Surgeon: Leandrew Koyanagi, MD;  Location: [facility];  Service: Ophthalmology;  Laterality: Right;  TORIC sleep apnea  . CATARACT EXTRACTION W/PHACO Left 08/05/2016   Procedure: CATARACT EXTRACTION PHACO AND INTRAOCULAR LENS PLACEMENT (IOC) toric;  Surgeon: Leandrew Koyanagi, MD;  Location: [facility];  Service: Ophthalmology;  Laterality: Left;  left sleep apnea toric  . COLONOSCOPY  10/2012   rpt 5 yrs ([facility])  . EYE SURGERY     Lens replacement 07/12/2016,08/05/2016  . LUMBAR EPIDURAL INJECTION Bilateral 04/2017, 12/2017   S1 transforaminal ESI ([place])  . RHINOPLASTY    . TONSILLECTOMY  ?1954  . TRIGGER FINGER RELEASE Right 11/17/13   4th    Social History   Socioeconomic History  . Marital status: Married    Spouse name: Not on  file  . Number of children: Not on file  . Years of education: Not on file  . Highest education level: Not on file  Occupational History  . Not on file  Social Needs  . Financial resource strain: Not on file  . Food insecurity    Worry: Not on file    Inability: Not on file  . Transportation needs    Medical: Not on file    Non-medical: Not on file  Tobacco Use  . Smoking status: Former Smoker    Quit date: 07/27/1978    Years since quitting: 40.5  . Smokeless tobacco: Never Used  . Tobacco comment: Quit in 11-18-78  Substance and Sexual Activity  . Alcohol use: Yes    Alcohol/week: 5.0 standard drinks    Types: 5 Shots of liquor per week    Comment:    . Drug use: No  . Sexual activity: Not on file  Lifestyle  . Physical activity    Days per week: Not on file    Minutes per session: Not on file  . Stress: Not on file  Relationships  . Social Herbalist on phone: Not on file    Gets together: Not on file    Attends religious service: Not on file    Active member of club or organization: Not on file    Attends meetings of clubs or organizations: Not on file    Relationship status: Not on file  . Intimate partner violence    Fear of current or ex partner: Not on file    Emotionally abused: Not on file    Physically abused: Not on file    Forced sexual activity: Not on file  Other Topics Concern  . Not on file  Social History Narrative   Lives at home with wife [name]), dog passed away 11-18-2015   1 grown child   Occ: retired Clinical biochemist, was Chief Technology Officer    Edu: 2 yrs college   Activity: no regular exercise   Diet: good water, fruits daily, splenda iced tea    Family History  Problem Relation Age of Onset  . Alcohol abuse Father   . Hypertension Father   . AVM Father 35       hemorrhagic stroke  . Diabetes Paternal Grandmother   . Cancer Neg Hx   . CAD Neg Hx   . Prostate cancer Neg Hx   . Bladder Cancer Neg Hx   . Kidney cancer Neg Hx      Allergies  Allergen Reactions  . Codeine Other (See Comments)    Other reaction(s): Other (See Comments) HEADACHES Causes headaches when he stops liquid Codeine-based products  . Ace Inhibitors Cough    Medication list reviewed and updated in full in [facility].  GEN: No fevers, chills. Nontoxic. Primarily MSK c/o today. MSK: Detailed in the HPI GI: tolerating PO intake without difficulty Neuro: No numbness, parasthesias, or tingling associated. Otherwise the pertinent positives of the ROS  are noted above.   Objective:   BP (!) 150/72   Pulse 67   Temp 98.7 F (37.1 C) (Temporal)   Ht 5' 10.5" (1.791 m)   Wt 289 lb 12 oz (131.4 kg)   SpO2 97%   BMI 40.99 kg/m    GEN: Well-developed,well-nourished,in no acute distress; alert,appropriate and cooperative throughout examination HEENT: Normocephalic and atraumatic without obvious abnormalities. Ears, externally no deformities PULM: Breathing comfortably in no respiratory distress EXT: No clubbing, cyanosis, or edema PSYCH: Normally interactive. Cooperative during the interview. Pleasant. Friendly and conversant. Not anxious or depressed appearing. Normal, full affect.  Shoulder: b Inspection: No muscle wasting or winging Ecchymosis/edema: neg  AC joint, scapula, clavicle: NT Cervical spine: NT, full ROM Spurling's: neg Abduction: full, 5/5 Flexion: full, 5/5 IR, full, lift-off: 5/5 ER at neutral: full, 5/5 AC crossover: neg Neer: pos Hawkins: pos Drop Test: neg Empty Can: pos Supraspinatus insertion: mild-mod T Bicipital groove: NT Speed's: neg Yergason's: neg Sulcus sign: neg Scapular dyskinesis: none C5-T1 intact  Neuro: Sensation intact Grip 5/5   Knee:  R Gait: Normal heel toe pattern ROM: 0-125 Effusion: mild Echymosis or edema: none Patellar tendon NT Painful PLICA: neg Patellar grind: negative Medial and lateral patellar facet loading: mildly tender medial and lateral joint lines:NT  Mcmurray's neg Flexion-pinch neg Varus and valgus stress: stable Lachman: neg Ant and Post drawer: neg Hip abduction, IR, ER: WNL Hip flexion str: 5/5 Hip abd: 5/5 Quad: 5/5 VMO atrophy:No Hamstring concentric and eccentric: 5/5   Radiology: No results found.  Assessment and Plan:     ICD-10-CM   1. Primary osteoarthritis of both shoulders  M19.011    M19.012   2. Acute pain of right knee  M25.561    His right knee is relatively benign with a mild effusion.  Some patellar irritation, but all ligaments are intact.  No provokable meniscal test.  Chronic pain of the right shoulders with arthritis, and he has had a good response to corticosteroid injection in the past, so I think this is reasonable to repeat again.  Intraarticular Shoulder Aspiration/Injection Procedure Note Lendon George 11-04-1945 Date of procedure: 02/01/2019  Procedure: Large Joint Aspiration / Injection of Shoulder, Intraarticular, RIGHT Indications: Pain  Procedure Details Verbal consent was obtained from the patient. Risks including infection explained and contrasted with benefits and alternatives. Patient prepped with Chloraprep and Ethyl Chloride used for anesthesia. An intraarticular shoulder injection was performed using the posterior approach; needle placed into joint capsule without difficulty. The patient tolerated the procedure well and had decreased pain post injection. No complications. Injection: 8 cc of Lidocaine 1% and 2 mL Depo-Medrol 40 mg. Needle: 21 gauge, 2 inch   Intraarticular Shoulder Aspiration/Injection Procedure Note Amaru Burroughs Jun 27, 1946 Date of procedure: 02/01/2019  Procedure: Large Joint Aspiration / Injection of Shoulder, Intraarticular, LEFT Indications: Pain  Procedure Details Verbal consent was obtained from the patient. Risks including infection explained and contrasted with benefits and alternatives. Patient prepped with Chloraprep and Ethyl Chloride used for  anesthesia. An intraarticular shoulder injection was performed using the posterior approach; needle placed into joint capsule without difficulty. The patient tolerated the procedure well and had decreased pain post injection. No complications. Injection: 8 cc of Lidocaine 1% and 2 mL Depo-Medrol 40 mg. Needle: 21 gauge, 2 1/2 inch spinal needle  Follow-up: No follow-ups on file.  No orders of the defined types were placed in this encounter.  No orders of the defined types were placed in this encounter.  Signed,  Maud Deed. Arlen Dupuis, MD   Outpatient Encounter Medications as of 02/01/2019  Medication Sig  . albuterol (PROVENTIL) (2.5 MG/3ML) 0.083% nebulizer solution Take 3 mLs (2.5 mg total) by nebulization every 6 (six) hours as needed for wheezing or shortness of breath.  [name] Kitchen albuterol (VENTOLIN HFA) 108 (90 Base) MCG/ACT inhaler Inhale 2 puffs into the lungs every 6 (six) hours as needed for wheezing or shortness of breath.  . allopurinol (ZYLOPRIM) 300 MG tablet TAKE 1 TABLET(300 MG) BY MOUTH DAILY  . amLODipine (NORVASC) 5 MG tablet TAKE 1 TABLET(5 MG) BY MOUTH TWICE DAILY  . atorvastatin (LIPITOR) 40 MG tablet TAKE 1 TABLET(40 MG) BY MOUTH DAILY  . diclofenac (VOLTAREN) 75 MG EC tablet TAKE 1 TABLET BY MOUTH TWICE DAILY  . diclofenac sodium (VOLTAREN) 1 % GEL Apply 2 g topically daily as needed. Apply to both hands.  . fluticasone (FLONASE) 50 MCG/ACT nasal spray SHAKE LIQUID AND USE 2 SPRAYS IN EACH NOSTRIL DAILY  . losartan-hydrochlorothiazide (HYZAAR) 50-12.5 MG tablet TAKE 1 TABLET BY MOUTH EVERY DAY  . metaxalone (SKELAXIN) 800 MG tablet Take 1 tablet (800 mg total) by mouth 3 (three) times daily as needed for muscle spasms.  . Multiple Vitamins tablet Take 1 tablet by mouth daily.  . naproxen sodium (ANAPROX) 220 MG tablet Take 440 mg by mouth daily.   . sildenafil (VIAGRA) 100 MG tablet Take by mouth.  . tadalafil (CIALIS) 20 MG tablet Take 20 mg by mouth daily as needed for  erectile dysfunction.  [name] Kitchen testosterone cypionate (DEPOTESTOSTERONE CYPIONATE) 200 MG/ML injection Inject 0.5 mLs (100 mg total) into the muscle once a week.  . tiotropium (SPIRIVA HANDIHALER) 18 MCG inhalation capsule Place 1 capsule (18 mcg total) into inhaler and inhale daily.  . traMADol (ULTRAM) 50 MG tablet TAKE 1 TABLET BY MOUTH EVERY 8 HOURS AS NEEDED FOR MODERATE PAIN  . zolpidem (AMBIEN) 10 MG tablet Take 1 tablet (10 mg total) by mouth at bedtime as needed. For sleep.  . [DISCONTINUED] losartan-hydrochlorothiazide (HYZAAR) 100-25 MG tablet Take 1 tablet by mouth daily.  . [DISCONTINUED] Magnesium 500 MG TABS Take 0.5-1 tablets (250-500 mg total) by mouth every Monday, Wednesday, and Friday.  . [DISCONTINUED] tadalafil (CIALIS) 5 MG tablet Take by mouth.   No facility-administered encounter medications on file as of 02/01/2019.

## 2019-02-01 NOTE — Addendum Note (Signed)
Addended by: Carter Kitten on: 02/01/2019 11:16 AM   Modules accepted: Orders

## 2019-02-03 ENCOUNTER — Encounter: Payer: Self-pay | Admitting: Family Medicine

## 2019-02-03 ENCOUNTER — Telehealth: Payer: Self-pay | Admitting: Family Medicine

## 2019-02-03 ENCOUNTER — Ambulatory Visit (INDEPENDENT_AMBULATORY_CARE_PROVIDER_SITE_OTHER): Payer: PPO | Admitting: Family Medicine

## 2019-02-03 VITALS — BP 145/73 | HR 68 | Ht 70.5 in

## 2019-02-03 DIAGNOSIS — J449 Chronic obstructive pulmonary disease, unspecified: Secondary | ICD-10-CM | POA: Diagnosis not present

## 2019-02-03 DIAGNOSIS — R5381 Other malaise: Secondary | ICD-10-CM

## 2019-02-03 NOTE — Telephone Encounter (Signed)
Pt states that he had a reaction to his medication  Experienced some SOB, breathing problems and tightness Took his Spiriva inhaler and Albuterol hfa  Pt notes having increased breathing issues after using inhalers so he took his Albuterol Nebulizer and for about 4 hours after this he felt terrible and his breathing worsened. Pt feels maybe he took too much Albuterol HFA.  Pt notes having HR of 115, O2 saturation was good, Pressure in chest, chest tightness, "lung pain".   Call transferred to [facility] to further triage.

## 2019-02-03 NOTE — Progress Notes (Signed)
Virtual visit completed through [email or web address]. Due to national recommendations of social distancing due to COVID-19, a virtual visit is felt to be most appropriate for this patient at this time. Reviewed limitations of a virtual visit.   Patient location: home Provider location:  at [facility], office If any vitals were documented, they were collected by patient at home unless specified below.    BP (!) 145/73   Pulse 68   Ht 5' 10.5" (1.791 m)   SpO2 97%   BMI 40.99 kg/m    CC: dyspnea Subjective:    Patient ID: Aaron Mitchell, male    DOB: 01/05/1946, 73 y.o.   MRN: [id number]  HPI: Aaron Mitchell is a 73 y.o. male presenting on 02/03/2019 for Medication Reaction (Pt thinks he took touch much albuterol.  After using, pt felt burning in his chest and had SOB. Wants to discuss with Dr. Darnell Level. )   Felt ill yesterday: Used spiriva inhaler yesterday morning like normal (takes intermittently).  Later in the day felt chest congestion so he used albuterol inhaler, 1 puff (2pm).  Even later in the day (around 3pm) felt mid chest discomfort, chest congestion, last evening used albuterol nebulizer (1/2 vial full) - seemed to have paradoxical reaction where felt "like my lungs were on fire" associated with central chest pain, malaise, diaphoresis, jittery feeling and nervous feeling. O2 sat remained around 98%. HR up to 115. Did not check BP.   Today feels mild jitteriness - has not used any respiratory medication today.   Normally rarely uses albuterol inhaler, and even less frequently albuterol nebulizer.   No dyspnea, wheeze, cough. No fevers.  He did have bilateral shoulder steroid injections 02/01/2019 by Dr Lorelei Pont.      Relevant past medical, surgical, family and social history reviewed and updated as indicated. Interim medical history since our last visit reviewed. Allergies and medications reviewed and updated. Outpatient Medications Prior to Visit  Medication Sig Dispense Refill   . albuterol (PROVENTIL) (2.5 MG/3ML) 0.083% nebulizer solution Take 3 mLs (2.5 mg total) by nebulization every 6 (six) hours as needed for wheezing or shortness of breath. 90 mL 3  . albuterol (VENTOLIN HFA) 108 (90 Base) MCG/ACT inhaler Inhale 2 puffs into the lungs every 6 (six) hours as needed for wheezing or shortness of breath. 3 Inhaler 3  . allopurinol (ZYLOPRIM) 300 MG tablet TAKE 1 TABLET(300 MG) BY MOUTH DAILY 90 tablet 3  . amLODipine (NORVASC) 5 MG tablet TAKE 1 TABLET(5 MG) BY MOUTH TWICE DAILY 180 tablet 0  . atorvastatin (LIPITOR) 40 MG tablet TAKE 1 TABLET(40 MG) BY MOUTH DAILY 90 tablet 2  . diclofenac (VOLTAREN) 75 MG EC tablet TAKE 1 TABLET BY MOUTH TWICE DAILY 180 tablet 0  . diclofenac sodium (VOLTAREN) 1 % GEL Apply 2 g topically daily as needed. Apply to both hands.    . fluticasone (FLONASE) 50 MCG/ACT nasal spray SHAKE LIQUID AND USE 2 SPRAYS IN EACH NOSTRIL DAILY 48 g 3  . losartan-hydrochlorothiazide (HYZAAR) 50-12.5 MG tablet TAKE 1 TABLET BY MOUTH EVERY DAY 90 tablet 1  . metaxalone (SKELAXIN) 800 MG tablet Take 1 tablet (800 mg total) by mouth 3 (three) times daily as needed for muscle spasms. 270 tablet 0  . Multiple Vitamins tablet Take 1 tablet by mouth daily.    . naproxen sodium (ANAPROX) 220 MG tablet Take 440 mg by mouth daily.     . sildenafil (VIAGRA) 100 MG tablet Take by mouth.    [name] Kitchen  tadalafil (CIALIS) 20 MG tablet Take 20 mg by mouth daily as needed for erectile dysfunction.    [name] Kitchen testosterone cypionate (DEPOTESTOSTERONE CYPIONATE) 200 MG/ML injection Inject 0.5 mLs (100 mg total) into the muscle once a week. 10 mL 0  . tiotropium (SPIRIVA HANDIHALER) 18 MCG inhalation capsule Place 1 capsule (18 mcg total) into inhaler and inhale daily. 90 capsule 3  . traMADol (ULTRAM) 50 MG tablet TAKE 1 TABLET BY MOUTH EVERY 8 HOURS AS NEEDED FOR MODERATE PAIN 30 tablet 0  . zolpidem (AMBIEN) 10 MG tablet Take 1 tablet (10 mg total) by mouth at bedtime as needed. For  sleep. 30 tablet 0   No facility-administered medications prior to visit.      Per HPI unless specifically indicated in ROS section below Review of Systems Objective:    BP (!) 145/73   Pulse 68   Ht 5' 10.5" (1.791 m)   SpO2 97%   BMI 40.99 kg/m   Wt Readings from Last 3 Encounters:  02/01/19 289 lb 12 oz (131.4 kg)  11/25/18 285 lb (129.3 kg)  10/31/18 288 lb 12 oz (131 kg)     Physical exam: Gen: alert, NAD, not ill appearing Pulm: speaks in complete sentences without increased work of breathing Psych: normal mood, normal thought content      Assessment & Plan:   Problem List Items Addressed This Visit    Stage 3 severe COPD by GOLD classification ([place])    Reviewed importance of regular controller medication use - he will start using spiriva daily.       Malaise - Primary    Episode of malaise, jittery, anxiety, chest burning pain largely resolved today. This in setting of albuterol use (more than his norm) and bilateral shoulder steroid injections - anticipate combination of side effects to albuterol and steroid. Reassurance provided, to update [place] if recurrent of persistent symptoms. Pt agrees with plan.           No orders of the defined types were placed in this encounter.  No orders of the defined types were placed in this encounter.   I discussed the assessment and treatment plan with the patient. The patient was provided an opportunity to ask questions and all were answered. The patient agreed with the plan and demonstrated an understanding of the instructions. The patient was advised to call back or seek an in-person evaluation if the symptoms worsen or if the condition fails to improve as anticipated.  Follow up plan: No follow-ups on file.  [name], MD

## 2019-02-03 NOTE — Telephone Encounter (Signed)
I spoke with pt; pt has no CP, chest tightness,pressure in chest. No problems today.Had symptoms on 02/02/19. Pt wants to know if the breathing problems happen again what should pt do and not do. Virtual scheduled today with Dr Darnell Level at 2:45. ED precautions given.

## 2019-02-03 NOTE — Telephone Encounter (Signed)
Seen today. 

## 2019-02-04 DIAGNOSIS — R5381 Other malaise: Secondary | ICD-10-CM | POA: Insufficient documentation

## 2019-02-04 NOTE — Assessment & Plan Note (Signed)
Episode of malaise, jittery, anxiety, chest burning pain largely resolved today. This in setting of albuterol use (more than his norm) and bilateral shoulder steroid injections - anticipate combination of side effects to albuterol and steroid. Reassurance provided, to update [place] if recurrent of persistent symptoms. Pt agrees with plan.

## 2019-02-04 NOTE — Assessment & Plan Note (Signed)
Reviewed importance of regular controller medication use - he will start using spiriva daily.

## 2019-02-09 DIAGNOSIS — G4733 Obstructive sleep apnea (adult) (pediatric): Secondary | ICD-10-CM | POA: Diagnosis not present

## 2019-03-02 ENCOUNTER — Other Ambulatory Visit: Payer: Self-pay | Admitting: Family Medicine

## 2019-03-03 ENCOUNTER — Other Ambulatory Visit: Payer: Self-pay | Admitting: Family Medicine

## 2019-03-03 NOTE — Telephone Encounter (Signed)
Eprescribed.

## 2019-03-03 NOTE — Telephone Encounter (Signed)
Patient stated that he would like this sent in today if possible.

## 2019-03-03 NOTE — Telephone Encounter (Signed)
Name of Medication: Ambien Name of Pharmacy: [facility] Date and Quantity: 10/16/18, #30 Last Office Visit and Type: 02/03/19, acute Next Office Visit and Type: 07/27/19, CPE Pt 2 Last Controlled Substance Agreement Date: none Last UDS: none

## 2019-03-06 ENCOUNTER — Encounter: Payer: Self-pay | Admitting: Family Medicine

## 2019-03-06 ENCOUNTER — Telehealth: Payer: Self-pay | Admitting: Family Medicine

## 2019-03-06 DIAGNOSIS — M5416 Radiculopathy, lumbar region: Secondary | ICD-10-CM | POA: Diagnosis not present

## 2019-03-06 DIAGNOSIS — M5126 Other intervertebral disc displacement, lumbar region: Secondary | ICD-10-CM | POA: Diagnosis not present

## 2019-03-06 DIAGNOSIS — M48062 Spinal stenosis, lumbar region with neurogenic claudication: Secondary | ICD-10-CM | POA: Diagnosis not present

## 2019-03-06 DIAGNOSIS — M5136 Other intervertebral disc degeneration, lumbar region: Secondary | ICD-10-CM | POA: Diagnosis not present

## 2019-03-06 NOTE — Telephone Encounter (Addendum)
Left message on vm per dpr notifying pt I was returning his call.   See TE, 03/06/19.

## 2019-03-06 NOTE — Telephone Encounter (Signed)
Patient called in regards to doxy visit we scheduled for him,  He sent a my chart request this a/m stating his is having sore throat, but it is more itchy then hurting. Patient stated this has been going on for some time and had this before covid started.  Advised patient of protocols we have to follow up front but would sent a message back to see what you suggest  He Would like to know if he can come into the office to see you or if he be referred to an ENT office   C/B #

## 2019-03-06 NOTE — Telephone Encounter (Signed)
Patient stated he has also had a covid test done and it came back negative

## 2019-03-06 NOTE — Telephone Encounter (Signed)
Left message on vm notifying pt I was returning his call.  Asked him to call back.

## 2019-03-06 NOTE — Telephone Encounter (Signed)
Ok to schedule in office. Thanks.

## 2019-03-07 ENCOUNTER — Other Ambulatory Visit: Payer: Self-pay

## 2019-03-07 ENCOUNTER — Ambulatory Visit (INDEPENDENT_AMBULATORY_CARE_PROVIDER_SITE_OTHER): Payer: PPO | Admitting: Family Medicine

## 2019-03-07 ENCOUNTER — Encounter: Payer: Self-pay | Admitting: Family Medicine

## 2019-03-07 VITALS — BP 128/64 | HR 80 | Temp 98.2°F | Ht 70.5 in | Wt 283.2 lb

## 2019-03-07 DIAGNOSIS — J302 Other seasonal allergic rhinitis: Secondary | ICD-10-CM | POA: Diagnosis not present

## 2019-03-07 DIAGNOSIS — R490 Dysphonia: Secondary | ICD-10-CM | POA: Diagnosis not present

## 2019-03-07 DIAGNOSIS — J029 Acute pharyngitis, unspecified: Secondary | ICD-10-CM | POA: Insufficient documentation

## 2019-03-07 MED ORDER — FEXOFENADINE HCL 180 MG PO TABS: 180.0000 mg | ORAL_TABLET | Freq: Every day | ORAL | 0 refills | Status: DC

## 2019-03-07 NOTE — Assessment & Plan Note (Addendum)
Ongoing for 3 wks in h/o allergic rhinitis possibly flare in summer months - rec add antihistamine to his regular flonase. Discussed possible hidden reflux component to symptoms. In smoking history reasonable to refer to ENT for full exam as well. Pt agrees with plan.

## 2019-03-07 NOTE — Patient Instructions (Signed)
Exam overall ok.  Possible allergy or reflux component.  Trial daily antihistamine (claritin or allegra) for next few weeks to see if any benefit.  We will also refer you to ENT for exam.

## 2019-03-07 NOTE — Telephone Encounter (Signed)
Pt seen in office today.

## 2019-03-07 NOTE — Progress Notes (Signed)
This visit was conducted in person.  BP 128/64 (BP Location: Left Arm, Patient Position: Sitting, Cuff Size: Large)   Pulse 80   Temp 98.2 F (36.8 C) (Temporal)   Ht 5' 10.5" (1.791 m)   Wt 283 lb 3 oz (128.5 kg)   SpO2 97%   BMI 40.06 kg/m    CC: ST Subjective:    Patient ID: Aaron Mitchell, male    DOB: [birth year], 73 y.o.   MRN: [id number]  HPI: Aaron Mitchell is a 73 y.o. male presenting on 03/07/2019 for Sore Throat (C/o sore, itchy, scratchy throat.  Off and on voice change. Started about 3 wks ago. )   3wk h/o scratchy itchy sore throat associated with off and on raspy voice that improves with throat clearing. Throat clearing cough, bringing up some mucous.  Known allergic rhintis, worse in summer months. Endorses chronic PNDrainage managed with flonase daily.  Denies GERD symptoms.  No wheezing. No dysphagia or odynophagia. No early satiety. No unexpected weight loss.   H/o ACEI induced cough, doing better on losartan.  Ex smoker quit 1980.  Alcohol - 5 shots liquor/wk.   Wife recently had neg covid test. He has been self isolating.      Relevant past medical, surgical, family and social history reviewed and updated as indicated. Interim medical history since our last visit reviewed. Allergies and medications reviewed and updated. Outpatient Medications Prior to Visit  Medication Sig Dispense Refill  . albuterol (PROVENTIL) (2.5 MG/3ML) 0.083% nebulizer solution Take 3 mLs (2.5 mg total) by nebulization every 6 (six) hours as needed for wheezing or shortness of breath. 90 mL 3  . albuterol (VENTOLIN HFA) 108 (90 Base) MCG/ACT inhaler Inhale 2 puffs into the lungs every 6 (six) hours as needed for wheezing or shortness of breath. 3 Inhaler 3  . allopurinol (ZYLOPRIM) 300 MG tablet TAKE 1 TABLET BY MOUTH DAILY 90 tablet 1  . amLODipine (NORVASC) 5 MG tablet TAKE 1 TABLET(5 MG) BY MOUTH TWICE DAILY 180 tablet 0  . atorvastatin (LIPITOR) 40 MG tablet TAKE 1 TABLET(40  MG) BY MOUTH DAILY 90 tablet 2  . diclofenac (VOLTAREN) 75 MG EC tablet TAKE 1 TABLET BY MOUTH TWICE DAILY 180 tablet 0  . diclofenac sodium (VOLTAREN) 1 % GEL Apply 2 g topically daily as needed. Apply to both hands.    . fluticasone (FLONASE) 50 MCG/ACT nasal spray SHAKE LIQUID AND USE 2 SPRAYS IN EACH NOSTRIL DAILY 48 g 3  . losartan-hydrochlorothiazide (HYZAAR) 50-12.5 MG tablet TAKE 1 TABLET BY MOUTH EVERY DAY 90 tablet 1  . metaxalone (SKELAXIN) 800 MG tablet Take 1 tablet (800 mg total) by mouth 3 (three) times daily as needed for muscle spasms. 270 tablet 0  . Multiple Vitamins tablet Take 1 tablet by mouth daily.    . naproxen sodium (ANAPROX) 220 MG tablet Take 440 mg by mouth daily.     . sildenafil (VIAGRA) 100 MG tablet Take by mouth.    . tadalafil (CIALIS) 20 MG tablet Take 20 mg by mouth daily as needed for erectile dysfunction.    [name] Kitchen testosterone cypionate (DEPOTESTOSTERONE CYPIONATE) 200 MG/ML injection Inject 0.5 mLs (100 mg total) into the muscle once a week. 10 mL 0  . tiotropium (SPIRIVA HANDIHALER) 18 MCG inhalation capsule Place 1 capsule (18 mcg total) into inhaler and inhale daily. 90 capsule 3  . traMADol (ULTRAM) 50 MG tablet TAKE 1 TABLET BY MOUTH EVERY 8 HOURS AS NEEDED FOR MODERATE PAIN 30 tablet  0  . zolpidem (AMBIEN) 10 MG tablet TAKE 1 TABLET(10 MG) BY MOUTH AT BEDTIME AS NEEDED FOR SLEEP 30 tablet 0   No facility-administered medications prior to visit.      Per HPI unless specifically indicated in ROS section below Review of Systems Objective:    BP 128/64 (BP Location: Left Arm, Patient Position: Sitting, Cuff Size: Large)   Pulse 80   Temp 98.2 F (36.8 C) (Temporal)   Ht 5' 10.5" (1.791 m)   Wt 283 lb 3 oz (128.5 kg)   SpO2 97%   BMI 40.06 kg/m   Wt Readings from Last 3 Encounters:  03/07/19 283 lb 3 oz (128.5 kg)  02/01/19 289 lb 12 oz (131.4 kg)  11/25/18 285 lb (129.3 kg)    Physical Exam Vitals signs and nursing note reviewed.   Constitutional:      General: He is not in acute distress.    Appearance: Normal appearance. He is well-developed. He is obese. He is not ill-appearing.  HENT:     Head: Normocephalic and atraumatic.     Right Ear: Hearing normal.     Left Ear: Hearing normal.     Nose: Nose normal. No mucosal edema or rhinorrhea.     Mouth/Throat:     Mouth: Mucous membranes are moist. No oral lesions.     Pharynx: Oropharynx is clear. Uvula midline. No oropharyngeal exudate or posterior oropharyngeal erythema.  Eyes:     General: No scleral icterus.    Conjunctiva/sclera: Conjunctivae normal.     Pupils: Pupils are equal, round, and reactive to light.  Neck:     Musculoskeletal: Normal range of motion and neck supple.     Thyroid: No thyroid mass.  Lymphadenopathy:     Cervical: No cervical adenopathy.  Neurological:     Mental Status: He is alert.  Psychiatric:        Mood and Affect: Mood normal.        Behavior: Behavior normal.       Results for orders placed or performed in visit on 01/20/19  HM DIABETES EYE EXAM  Result Value Ref Range   HM Diabetic Eye Exam Retinopathy (A) No Retinopathy   Assessment & Plan:   Problem List Items Addressed This Visit    Sore throat - Primary    Ongoing for 3 wks in h/o allergic rhinitis possibly flare in summer months - rec add antihistamine to his regular flonase. Discussed possible hidden reflux component to symptoms. In smoking history reasonable to refer to ENT for full exam as well. Pt agrees with plan.       Relevant Orders   Ambulatory referral to ENT   Allergic rhinitis, seasonal    Other Visit Diagnoses    Raspy voice       Relevant Orders   Ambulatory referral to ENT       Meds ordered this encounter  Medications  . fexofenadine (ALLEGRA ALLERGY) 180 MG tablet    Sig: Take 1 tablet (180 mg total) by mouth daily.    Dispense:  90 tablet    Refill:  0   Orders Placed This Encounter  Procedures  . Ambulatory referral to ENT     Referral Priority:   Routine    Referral Type:   Consultation    Referral Reason:   Specialty Services Required    Requested Specialty:   Otolaryngology    Number of Visits Requested:   1    Follow up plan:  No follow-ups on file.  [name], MD

## 2019-03-08 ENCOUNTER — Other Ambulatory Visit: Payer: Self-pay

## 2019-03-08 ENCOUNTER — Other Ambulatory Visit: Payer: PPO

## 2019-03-08 DIAGNOSIS — R7989 Other specified abnormal findings of blood chemistry: Secondary | ICD-10-CM

## 2019-03-09 ENCOUNTER — Telehealth: Payer: Self-pay

## 2019-03-09 DIAGNOSIS — E291 Testicular hypofunction: Secondary | ICD-10-CM

## 2019-03-09 LAB — TESTOSTERONE: Testosterone: 878 ng/dL (ref 264–916)

## 2019-03-09 LAB — HEMATOCRIT: Hematocrit: 50.8 % (ref 37.5–51.0)

## 2019-03-09 NOTE — Telephone Encounter (Signed)
-----   Message from Billey Co, MD sent at 03/09/2019  9:46 AM EDT ----- Labs look great, testosterone at 878, and hct 50.8. RTC one year with PSA, T, and hct prior. Continue current testosterone dosing, ok to refill if needed.  Nickolas Madrid, MD 03/09/2019

## 2019-03-09 NOTE — Telephone Encounter (Signed)
Called pt informed him of the information below. He states he has already seen results via Mychart and wants me to inform the provider that he has just done a testosterone injection on Sunday so this may have bearing on his results. Will route to provider.

## 2019-03-10 ENCOUNTER — Other Ambulatory Visit: Payer: Self-pay | Admitting: Family Medicine

## 2019-03-10 NOTE — Telephone Encounter (Signed)
Last filled on 12/14/2018 for #180 with 0 refill.

## 2019-03-13 ENCOUNTER — Telehealth: Payer: Self-pay

## 2019-03-13 DIAGNOSIS — E113212 Type 2 diabetes mellitus with mild nonproliferative diabetic retinopathy with macular edema, left eye: Secondary | ICD-10-CM | POA: Diagnosis not present

## 2019-03-13 NOTE — Telephone Encounter (Signed)
mychart sent.

## 2019-03-13 NOTE — Telephone Encounter (Signed)
-----   Message from Billey Co, MD sent at 03/09/2019  9:46 AM EDT ----- Labs look great, testosterone at 878, and hct 50.8. RTC one year with PSA, T, and hct prior. Continue current testosterone dosing, ok to refill if needed.  Nickolas Madrid, MD 03/09/2019

## 2019-03-16 DIAGNOSIS — Z20828 Contact with and (suspected) exposure to other viral communicable diseases: Secondary | ICD-10-CM | POA: Diagnosis not present

## 2019-03-20 DIAGNOSIS — R05 Cough: Secondary | ICD-10-CM | POA: Diagnosis not present

## 2019-03-20 DIAGNOSIS — J309 Allergic rhinitis, unspecified: Secondary | ICD-10-CM | POA: Diagnosis not present

## 2019-03-23 ENCOUNTER — Telehealth: Payer: Self-pay

## 2019-03-23 NOTE — Telephone Encounter (Signed)
Pt went to ENT 03/21/19. They suggested that the losartan was the cause of his irritated throat and cough. Had the same problem with lisinopril. He said Dr Tami Ribas sent a consult note with this information. Pt is asking what he should do.   Please call pt to advise at [phone]. New medication needs to be sent to [facility]. [facility].

## 2019-03-24 MED ORDER — TRIAMTERENE-HCTZ 37.5-25 MG PO TABS: 1.0000 | ORAL_TABLET | Freq: Every day | ORAL | 6 refills | Status: DC

## 2019-03-24 NOTE — Telephone Encounter (Signed)
Received ENT note. rec trial off ARB and monitor cough.  plz call patient - let's stop losartan hctz. Start triamterene HCTZ in its place - combo diuretic sent to pharmacy. Try this for 1 month and update [place] with BP readings. Continue amlodipine 5mg  daily. If doing well with this, will send in 42mo supply.

## 2019-03-24 NOTE — Telephone Encounter (Signed)
Left message on vm per dpr relaying Dr. Synthia Innocent instructions and message.

## 2019-04-19 ENCOUNTER — Other Ambulatory Visit: Payer: Self-pay

## 2019-04-20 ENCOUNTER — Ambulatory Visit (INDEPENDENT_AMBULATORY_CARE_PROVIDER_SITE_OTHER): Payer: PPO | Admitting: Family Medicine

## 2019-04-20 ENCOUNTER — Encounter: Payer: Self-pay | Admitting: Family Medicine

## 2019-04-20 VITALS — BP 150/72 | HR 73 | Temp 98.1°F | Ht 70.5 in | Wt 285.2 lb

## 2019-04-20 DIAGNOSIS — M7061 Trochanteric bursitis, right hip: Secondary | ICD-10-CM | POA: Diagnosis not present

## 2019-04-20 DIAGNOSIS — M7062 Trochanteric bursitis, left hip: Secondary | ICD-10-CM

## 2019-04-20 MED ORDER — METHYLPREDNISOLONE ACETATE 40 MG/ML IJ SUSP
80.0000 mg | Freq: Once | INTRAMUSCULAR | Status: AC
  Administered 2019-04-20: 80 mg via INTRA_ARTICULAR

## 2019-04-20 NOTE — Progress Notes (Signed)
     Bernadette Gores T. Kaymon Denomme, MD Primary Care and [place] at [facility] [place], [address] Phone: [phone]  FAX: [phone]  Noach Pribble - 73 y.o. male  MRN [id number]  Date of Birth: 1945/10/31  Visit Date: 04/20/2019  PCP: [name], MD  Referred by: [name], MD  Chief Complaint  Patient presents with  . Hip Pain    Bilateral injections    Recurrent troch bursitis, B hips.  Aspiration/Injection Procedure Note Jaguar Safko 1946-02-08 Date of procedure: 04/20/2019  Procedure: Large Joint Aspiration / Injection of Hip, Trochanteric Bursa, RIGHT Indications: Pain  Procedure Details Verbal consent obtained. Risks (including infection, potential atrophy), benefits, and alternatives reviewed. Greater trochanter sterilely prepped with Chloraprep. Ethyl Chloride used for anesthesia. 8 cc of Lidocaine 1% injected with 2 mL of Depo-Medrol 40 mg into trochanteric bursa at area of maximal tenderness at greater trochanter. Needle taken to bone to troch bursa, flows easily. Bursa massaged. No bleeding and no complications. Decreased pain after injection. Needle: 22 gauge spinal needle Medication: 2 mL of Depo-Medrol 40 mg, equaling Depo-Medrol 80 mg total   Aspiration/Injection Procedure Note Delma Mccarville 05/17/1946 Date of procedure: 04/20/2019  Procedure: Large Joint Aspiration / Injection of Hip, Trochanteric Bursa, LEFT Indications: Pain  Procedure Details Verbal consent obtained. Risks (including infection, potential atrophy), benefits, and alternatives reviewed. Greater trochanter sterilely prepped with Chloraprep. Ethyl Chloride used for anesthesia. 8 cc of Lidocaine 1% injected with 2 mL of Depo-Medrol 40 mg into trochanteric bursa at area of maximal tenderness at greater trochanter. Needle taken to bone to troch bursa, flows easily. Bursa massaged. No bleeding and no complications. Decreased pain  after injection. Needle: 22 gauge spinal needle Medication: 2 mL of Depo-Medrol 40 mg, equaling Depo-Medrol 80 mg total   Signed,  Eziah Negro T. Zyann Mabry, MD

## 2019-05-01 DIAGNOSIS — R05 Cough: Secondary | ICD-10-CM | POA: Diagnosis not present

## 2019-05-08 ENCOUNTER — Encounter: Payer: Self-pay | Admitting: Family Medicine

## 2019-05-09 DIAGNOSIS — G4733 Obstructive sleep apnea (adult) (pediatric): Secondary | ICD-10-CM | POA: Diagnosis not present

## 2019-05-20 ENCOUNTER — Encounter: Payer: Self-pay | Admitting: Family Medicine

## 2019-05-26 ENCOUNTER — Other Ambulatory Visit: Payer: Self-pay

## 2019-05-26 DIAGNOSIS — G4733 Obstructive sleep apnea (adult) (pediatric): Secondary | ICD-10-CM | POA: Diagnosis not present

## 2019-05-26 NOTE — Telephone Encounter (Signed)
Will you address in Dr. Synthia Innocent absence?  Name of Medication: Ambien Name of Pharmacy: [facility] or Written Date and Quantity: 04/28/19, #30/0 Last Office Visit and Type: 04/20/19, hip pain w/ Dr. Lorelei Pont Next Office Visit and Type: 07/27/19, CPE prt 2 Last Controlled Substance Agreement Date: none Last UDS: none

## 2019-05-28 MED ORDER — ZOLPIDEM TARTRATE 10 MG PO TABS: ORAL_TABLET | ORAL | 0 refills | Status: DC

## 2019-05-28 NOTE — Telephone Encounter (Signed)
Agree. Thanks

## 2019-05-29 ENCOUNTER — Encounter: Payer: Self-pay | Admitting: Family Medicine

## 2019-05-29 ENCOUNTER — Other Ambulatory Visit: Payer: Self-pay | Admitting: *Deleted

## 2019-05-29 MED ORDER — CYCLOBENZAPRINE HCL 5 MG PO TABS: 5.0000 mg | ORAL_TABLET | Freq: Two times a day (BID) | ORAL | 0 refills | Status: DC | PRN

## 2019-05-29 NOTE — Telephone Encounter (Signed)
Patient left a voicemail requesting a refill on Tramadol Name of Medication: Tramadol Name of Pharmacy: [facility] Last Fill or Written Date and Quantity: 12/06/17 #30 Last Office Visit and Type: 04/20/19 Dr. Lorelei Pont Next Office Visit and Type: 06/29/19 Last Controlled Substance Agreement Date: none Last UDS: none

## 2019-05-30 ENCOUNTER — Other Ambulatory Visit: Payer: Self-pay

## 2019-05-30 NOTE — Telephone Encounter (Signed)
Refill request for Tramadol was already sent to Dr Darnell Level for review and is pending

## 2019-05-31 MED ORDER — TRAMADOL HCL 50 MG PO TABS: ORAL_TABLET | ORAL | 0 refills | Status: DC

## 2019-05-31 NOTE — Telephone Encounter (Signed)
ERx 

## 2019-06-01 ENCOUNTER — Encounter: Payer: Self-pay | Admitting: Family Medicine

## 2019-06-01 ENCOUNTER — Ambulatory Visit (INDEPENDENT_AMBULATORY_CARE_PROVIDER_SITE_OTHER): Payer: PPO | Admitting: Family Medicine

## 2019-06-01 ENCOUNTER — Other Ambulatory Visit: Payer: Self-pay

## 2019-06-01 VITALS — BP 122/66 | HR 71 | Temp 97.9°F | Ht 70.5 in | Wt 276.3 lb

## 2019-06-01 DIAGNOSIS — R7303 Prediabetes: Secondary | ICD-10-CM | POA: Diagnosis not present

## 2019-06-01 DIAGNOSIS — R519 Headache, unspecified: Secondary | ICD-10-CM | POA: Diagnosis not present

## 2019-06-01 DIAGNOSIS — G500 Trigeminal neuralgia: Secondary | ICD-10-CM | POA: Insufficient documentation

## 2019-06-01 LAB — CBC WITH DIFFERENTIAL/PLATELET
Basophils Absolute: 0 10*3/uL (ref 0.0–0.1)
Basophils Relative: 0.5 % (ref 0.0–3.0)
Eosinophils Absolute: 0.2 10*3/uL (ref 0.0–0.7)
Eosinophils Relative: 2.5 % (ref 0.0–5.0)
HCT: 51.1 % (ref 39.0–52.0)
Hemoglobin: 17.3 g/dL — ABNORMAL HIGH (ref 13.0–17.0)
Lymphocytes Relative: 27 % (ref 12.0–46.0)
Lymphs Abs: 1.6 10*3/uL (ref 0.7–4.0)
MCHC: 33.9 g/dL (ref 30.0–36.0)
MCV: 90.3 fl (ref 78.0–100.0)
Monocytes Absolute: 0.5 10*3/uL (ref 0.1–1.0)
Monocytes Relative: 8.1 % (ref 3.0–12.0)
Neutro Abs: 3.7 10*3/uL (ref 1.4–7.7)
Neutrophils Relative %: 61.9 % (ref 43.0–77.0)
Platelets: 155 10*3/uL (ref 150.0–400.0)
RBC: 5.66 Mil/uL (ref 4.22–5.81)
RDW: 14.3 % (ref 11.5–15.5)
WBC: 6 10*3/uL (ref 4.0–10.5)

## 2019-06-01 LAB — BASIC METABOLIC PANEL
BUN: 19 mg/dL (ref 6–23)
CO2: 31 mEq/L (ref 19–32)
Calcium: 9 mg/dL (ref 8.4–10.5)
Chloride: 98 mEq/L (ref 96–112)
Creatinine, Ser: 1.35 mg/dL (ref 0.40–1.50)
GFR: 52.07 mL/min — ABNORMAL LOW (ref >60.00)
Glucose, Bld: 208 mg/dL — ABNORMAL HIGH (ref 70–99)
Potassium: 3.9 mEq/L (ref 3.5–5.1)
Sodium: 138 mEq/L (ref 135–145)

## 2019-06-01 LAB — SEDIMENTATION RATE: Sed Rate: 6 mm/hr (ref 0–20)

## 2019-06-01 LAB — HEMOGLOBIN A1C: Hgb A1c MFr Bld: 6.4 % (ref 4.6–6.5)

## 2019-06-01 NOTE — Progress Notes (Signed)
This visit was conducted in person.  BP 122/66 (BP Location: Left Arm, Patient Position: Sitting, Cuff Size: Large)   Pulse 71   Temp 97.9 F (36.6 C) (Temporal)   Ht 5' 10.5" (1.791 m)   Wt 276 lb 5 oz (125.3 kg)   SpO2 98%   BMI 39.09 kg/m    CC: R jaw pain Subjective:    Patient ID: Aaron Mitchell, male    DOB: 1945-08-26, 73 y.o.   MRN: [id number]  HPI: Aaron Mitchell is a 73 y.o. male presenting on 06/01/2019 for Jaw Pain (C/o right jaw pain.  Area cramps when eating.  Started about 1 mo ago. Was seen previously for pain. )   See recent mychart messages.  Ongoing R jaw pain present for the past month previously thought TMJ related pain. States flexeril ineffective in the past, he continues skelaxin muscle relaxant PRN. Has seen ENT Tami Ribas) for this - he has stopped chewing hard foods with limited benefit. ENT does not treat TMJ dysfunction. States he had reassuring dental evaluation recently as well. Pain started after he was eating crunchy peanuts. Eating causes R jaw cramping. Treating with aleve, tramadol, skelaxin. Also takes diclofenac PO and topically.   Also notes low grade headache for the past month to R face into temple and parietal region - dull ache. No vision changes, nausea.   Cheek pain, headache intensifies when eating especially chewy foods.   Affecting ability to eat - only eating 1/2 of regular diet due to jaw pain, noted 9lb weight loss in the past few months.   Longstanding issues with "TMJ"  Remote smoker quit 1980 Alcohol - 5 drinks/wk.      Relevant past medical, surgical, family and social history reviewed and updated as indicated. Interim medical history since our last visit reviewed. Allergies and medications reviewed and updated. Outpatient Medications Prior to Visit  Medication Sig Dispense Refill  . albuterol (PROVENTIL) (2.5 MG/3ML) 0.083% nebulizer solution Take 3 mLs (2.5 mg total) by nebulization every 6 (six) hours as needed for  wheezing or shortness of breath. 90 mL 3  . albuterol (VENTOLIN HFA) 108 (90 Base) MCG/ACT inhaler Inhale 2 puffs into the lungs every 6 (six) hours as needed for wheezing or shortness of breath. 3 Inhaler 3  . allopurinol (ZYLOPRIM) 300 MG tablet TAKE 1 TABLET BY MOUTH DAILY 90 tablet 1  . amLODipine (NORVASC) 5 MG tablet TAKE 1 TABLET(5 MG) BY MOUTH TWICE DAILY 180 tablet 0  . atorvastatin (LIPITOR) 40 MG tablet TAKE 1 TABLET(40 MG) BY MOUTH DAILY 90 tablet 2  . diclofenac (VOLTAREN) 75 MG EC tablet TAKE 1 TABLET BY MOUTH TWICE DAILY 180 tablet 0  . diclofenac sodium (VOLTAREN) 1 % GEL Apply 2 g topically daily as needed. Apply to both hands.    . fluticasone (FLONASE) 50 MCG/ACT nasal spray SHAKE LIQUID AND USE 2 SPRAYS IN EACH NOSTRIL DAILY 48 g 3  . metaxalone (SKELAXIN) 800 MG tablet Take 1 tablet (800 mg total) by mouth 3 (three) times daily as needed for muscle spasms. 270 tablet 0  . Multiple Vitamins tablet Take 1 tablet by mouth daily.    . naproxen sodium (ANAPROX) 220 MG tablet Take 440 mg by mouth daily.     . sildenafil (VIAGRA) 100 MG tablet Take by mouth.    . tadalafil (CIALIS) 20 MG tablet Take 20 mg by mouth daily as needed for erectile dysfunction.    [name] Kitchen testosterone cypionate (DEPOTESTOSTERONE CYPIONATE) 200 MG/ML  injection Inject 0.5 mLs (100 mg total) into the muscle once a week. 10 mL 0  . tiotropium (SPIRIVA HANDIHALER) 18 MCG inhalation capsule Place 1 capsule (18 mcg total) into inhaler and inhale daily. 90 capsule 3  . traMADol (ULTRAM) 50 MG tablet TAKE 1 TABLET BY MOUTH EVERY 8 HOURS AS NEEDED FOR MODERATE PAIN 30 tablet 0  . triamterene-hydrochlorothiazide (MAXZIDE-25) 37.5-25 MG tablet Take 1 tablet by mouth daily. 30 tablet 6  . zolpidem (AMBIEN) 10 MG tablet TAKE 1 TABLET(10 MG) BY MOUTH AT BEDTIME AS NEEDED FOR SLEEP 30 tablet 0  . cyclobenzaprine (FLEXERIL) 5 MG tablet Take 1 tablet (5 mg total) by mouth 2 (two) times daily as needed for muscle spasms (jaw pain,  sedation precautions). 30 tablet 0   No facility-administered medications prior to visit.      Per HPI unless specifically indicated in ROS section below Review of Systems Objective:    BP 122/66 (BP Location: Left Arm, Patient Position: Sitting, Cuff Size: Large)   Pulse 71   Temp 97.9 F (36.6 C) (Temporal)   Ht 5' 10.5" (1.791 m)   Wt 276 lb 5 oz (125.3 kg)   SpO2 98%   BMI 39.09 kg/m   Wt Readings from Last 3 Encounters:  06/01/19 276 lb 5 oz (125.3 kg)  04/20/19 285 lb 4 oz (129.4 kg)  03/07/19 283 lb 3 oz (128.5 kg)    Physical Exam Vitals signs and nursing note reviewed.  Constitutional:      General: He is not in acute distress.    Appearance: Normal appearance. He is obese. He is not ill-appearing.  HENT:     Head: Normocephalic and atraumatic.     Right Ear: Tympanic membrane, ear canal and external ear normal. There is no impacted cerumen.     Left Ear: Tympanic membrane, ear canal and external ear normal. There is no impacted cerumen.     Nose: Nose normal.     Mouth/Throat:     Mouth: Mucous membranes are moist.     Pharynx: Oropharynx is clear. No posterior oropharyngeal erythema or uvula swelling.     Tonsils: No tonsillar exudate or tonsillar abscesses.     Comments:  No dental pain TMJ clicking without pain No pain or enlargement of parotids Mild discomfort to palpation of R temporal artery Discomfort to palpation at R salivary gland and tender to palpation at R stensen's duct without obvious stone Eyes:     General:        Right eye: No discharge.        Left eye: No discharge.     Extraocular Movements: Extraocular movements intact.     Conjunctiva/sclera: Conjunctivae normal.     Pupils: Pupils are equal, round, and reactive to light.  Neck:     Vascular: No carotid bruit.  Lymphadenopathy:     Head:     Right side of head: Tonsillar (small, tender) adenopathy present. No submental, submandibular, preauricular, posterior auricular or occipital  adenopathy.     Left side of head: No submental, submandibular, tonsillar, preauricular, posterior auricular or occipital adenopathy.     Cervical: No cervical adenopathy.     Right cervical: No superficial cervical adenopathy.    Left cervical: No superficial cervical adenopathy.     Upper Body:     Right upper body: No supraclavicular adenopathy.     Left upper body: No supraclavicular adenopathy.  Neurological:     Mental Status: He is alert.  Psychiatric:        Mood and Affect: Mood normal.        Behavior: Behavior normal.       Results for orders placed or performed in visit on 06/01/19  Hemoglobin A1c  Result Value Ref Range   Hgb A1c MFr Bld 6.4 4.6 - 6.5 %  CBC with Differential  Result Value Ref Range   WBC 6.0 4.0 - 10.5 K/uL   RBC 5.66 4.22 - 5.81 Mil/uL   Hemoglobin 17.3 (H) 13.0 - 17.0 g/dL   HCT 51.1 39.0 - 52.0 %   MCV 90.3 78.0 - 100.0 fl   MCHC 33.9 30.0 - 36.0 g/dL   RDW 14.3 11.5 - 15.5 %   Platelets 155.0 150.0 - 400.0 K/uL   Neutrophils Relative % 61.9 43.0 - 77.0 %   Lymphocytes Relative 27.0 12.0 - 46.0 %   Monocytes Relative 8.1 3.0 - 12.0 %   Eosinophils Relative 2.5 0.0 - 5.0 %   Basophils Relative 0.5 0.0 - 3.0 %   Neutro Abs 3.7 1.4 - 7.7 K/uL   Lymphs Abs 1.6 0.7 - 4.0 K/uL   Monocytes Absolute 0.5 0.1 - 1.0 K/uL   Eosinophils Absolute 0.2 0.0 - 0.7 K/uL   Basophils Absolute 0.0 0.0 - 0.1 K/uL  Basic metabolic panel  Result Value Ref Range   Sodium 138 135 - 145 mEq/L   Potassium 3.9 3.5 - 5.1 mEq/L   Chloride 98 96 - 112 mEq/L   CO2 31 19 - 32 mEq/L   Glucose, Bld 208 (H) 70 - 99 mg/dL   BUN 19 6 - 23 mg/dL   Creatinine, Ser 1.35 0.40 - 1.50 mg/dL   GFR 52.07 (L) >60.00 mL/min   Calcium 9.0 8.4 - 10.5 mg/dL  Sedimentation rate  Result Value Ref Range   Sed Rate 6 0 - 20 mm/hr   Assessment & Plan:   Problem List Items Addressed This Visit    Right facial pain - Primary    Exam not consistent with TMJ dysfunction today. Possibly  salivary gland inflammation. Parotid felt normal today. Check ESR r/o GCA. Rec sour candy to stimulate saliva production and warm compresses, continue NSAID. Consider imaging pending labs today. Pt agrees with plan. May need to return to ENT.       Relevant Orders   CBC with Differential (Completed)   Basic metabolic panel (Completed)   Sedimentation rate (Completed)   Prediabetes    Update A1c.       Relevant Orders   Hemoglobin A1c (Completed)       No orders of the defined types were placed in this encounter.  Orders Placed This Encounter  Procedures  . Hemoglobin A1c  . CBC with Differential  . Basic metabolic panel  . Sedimentation rate    Patient Instructions  Labs today Depending on results we may check head imaging and refer you back to ENT.    Follow up plan: Return if symptoms worsen or fail to improve.  [name], MD

## 2019-06-01 NOTE — Assessment & Plan Note (Addendum)
Exam not consistent with TMJ dysfunction today. Possibly salivary gland inflammation. Parotid felt normal today. Check ESR r/o GCA. Rec sour candy to stimulate saliva production and warm compresses, continue NSAID. Consider imaging pending labs today. Pt agrees with plan. May need to return to ENT.

## 2019-06-01 NOTE — Patient Instructions (Addendum)
Labs today Depending on results we may check head imaging and refer you back to ENT.

## 2019-06-01 NOTE — Assessment & Plan Note (Signed)
Update A1c ?

## 2019-06-03 ENCOUNTER — Other Ambulatory Visit: Payer: Self-pay | Admitting: Family Medicine

## 2019-06-03 DIAGNOSIS — R519 Headache, unspecified: Secondary | ICD-10-CM

## 2019-06-04 ENCOUNTER — Encounter: Payer: Self-pay | Admitting: Family Medicine

## 2019-06-06 ENCOUNTER — Telehealth: Payer: Self-pay

## 2019-06-06 ENCOUNTER — Other Ambulatory Visit: Payer: Self-pay

## 2019-06-06 ENCOUNTER — Ambulatory Visit
Admission: RE | Admit: 2019-06-06 | Discharge: 2019-06-06 | Disposition: A | Discharge status: Home / Self Care | Payer: PPO | Source: Ambulatory Visit | Attending: Family Medicine | Admitting: Family Medicine

## 2019-06-06 DIAGNOSIS — R519 Headache, unspecified: Secondary | ICD-10-CM

## 2019-06-06 DIAGNOSIS — K1120 Sialoadenitis, unspecified: Secondary | ICD-10-CM | POA: Diagnosis not present

## 2019-06-06 MED ORDER — IOHEXOL 300 MG/ML  SOLN
75.0000 mL | Freq: Once | INTRAMUSCULAR | Status: AC | PRN
  Administered 2019-06-06: 75 mL via INTRAVENOUS

## 2019-06-06 NOTE — Addendum Note (Signed)
Addended by: [name] on: 06/06/2019 09:41 AM   Modules accepted: Orders

## 2019-06-06 NOTE — Telephone Encounter (Signed)
Noted. Thanks.

## 2019-06-06 NOTE — Telephone Encounter (Signed)
Dr [name] has changed the CT order and No Pre Cert is required.

## 2019-06-06 NOTE — Telephone Encounter (Signed)
Patient is scheduled for a CT scan at 1:30 this afternoon to check for a salivary mass. Per the CT department, this order needs to be changed to a CT neck with & without contrast. I have notified Dr. Darnell Level that this order needs to be updated, and I will route to [place] for insurance follow up - as this needs to checked into if covered by insurance prior to this appt.   Thanks ya'll!

## 2019-06-07 ENCOUNTER — Other Ambulatory Visit: Payer: Self-pay | Admitting: Family Medicine

## 2019-06-07 NOTE — Telephone Encounter (Signed)
Diclofenac tab Last filled:  03/10/19, #180 Last OV:  06/01/19, acute facial pain Next OV:  07/27/19, CPE prt 2

## 2019-06-08 ENCOUNTER — Telehealth: Payer: Self-pay

## 2019-06-08 NOTE — Addendum Note (Signed)
Addended by: [name] on: 06/08/2019 02:17 PM   Modules accepted: Orders

## 2019-06-08 NOTE — Telephone Encounter (Signed)
Received faxed PA form for cyclobenzaprine 5 mg.  Placed form in Dr. Synthia Innocent box.

## 2019-06-08 NOTE — Telephone Encounter (Signed)
May disregard - pt did not want to try flexeril.

## 2019-06-09 NOTE — Telephone Encounter (Signed)
Noted  

## 2019-06-12 DIAGNOSIS — E113212 Type 2 diabetes mellitus with mild nonproliferative diabetic retinopathy with macular edema, left eye: Secondary | ICD-10-CM | POA: Diagnosis not present

## 2019-06-14 ENCOUNTER — Encounter: Payer: Self-pay | Admitting: Family Medicine

## 2019-06-25 DIAGNOSIS — G4733 Obstructive sleep apnea (adult) (pediatric): Secondary | ICD-10-CM | POA: Diagnosis not present

## 2019-07-04 DIAGNOSIS — M5136 Other intervertebral disc degeneration, lumbar region: Secondary | ICD-10-CM | POA: Diagnosis not present

## 2019-07-04 DIAGNOSIS — M5416 Radiculopathy, lumbar region: Secondary | ICD-10-CM | POA: Diagnosis not present

## 2019-07-06 ENCOUNTER — Encounter: Payer: Self-pay | Admitting: Family Medicine

## 2019-07-12 ENCOUNTER — Other Ambulatory Visit: Payer: Self-pay

## 2019-07-12 ENCOUNTER — Ambulatory Visit (INDEPENDENT_AMBULATORY_CARE_PROVIDER_SITE_OTHER): Payer: PPO | Admitting: Family Medicine

## 2019-07-12 ENCOUNTER — Encounter: Payer: Self-pay | Admitting: Family Medicine

## 2019-07-12 VITALS — BP 140/80 | HR 67 | Temp 98.6°F | Ht 70.5 in | Wt 277.2 lb

## 2019-07-12 DIAGNOSIS — M7061 Trochanteric bursitis, right hip: Secondary | ICD-10-CM

## 2019-07-12 DIAGNOSIS — M7062 Trochanteric bursitis, left hip: Secondary | ICD-10-CM

## 2019-07-12 MED ORDER — METHYLPREDNISOLONE ACETATE 40 MG/ML IJ SUSP
80.0000 mg | Freq: Once | INTRAMUSCULAR | Status: AC
  Administered 2019-07-12: 80 mg via INTRA_ARTICULAR

## 2019-07-12 NOTE — Addendum Note (Signed)
Addended by: Carter Kitten on: 07/12/2019 09:03 AM   Modules accepted: Orders

## 2019-07-12 NOTE — Progress Notes (Signed)
     Mallery Harshman T. Chrisean Kloth, MD Primary Care and Sports Medicine [facility] at [facility] [place], [address] Phone: [phone]  FAX: [phone]  Sevion Shutt - 73 y.o. male  MRN [id number]  Date of Birth: 10/05/1945  Visit Date: 07/12/2019  PCP: [name], MD  Referred by: [name], MD  Chief Complaint  Patient presents with  . Hip Pain    Bilateral injections    This visit occurred during the SARS-CoV-2 public health emergency.  Safety protocols were in place, including screening questions prior to the visit, additional usage of staff PPE, and extensive cleaning of exam room while observing appropriate contact time as indicated for disinfecting solutions.    Aspiration/Injection Procedure Note Aaron Mitchell [birth year] Date of procedure: 07/12/2019  Procedure: Large Joint Aspiration / Injection of Hip, Trochanteric Bursa, R Indications: Pain  Procedure Details Verbal consent obtained. Risks (including infection, potential atrophy), benefits, and alternatives reviewed. Greater trochanter sterilely prepped with Chloraprep. Ethyl Chloride used for anesthesia. 8 cc of Lidocaine 1% injected with 2 mL of Depo-Medrol 40 mg into trochanteric bursa at area of maximal tenderness at greater trochanter. Needle taken to bone to troch bursa, flows easily. Bursa massaged. No bleeding and no complications. Decreased pain after injection. Needle: 22 gauge spinal needle Medication: 2 mL of Depo-Medrol 40 mg, equaling Depo-Medrol 80 mg total   Aspiration/Injection Procedure Note Aaron Mitchell 17-May-1946 Date of procedure: 07/12/2019  Procedure: Large Joint Aspiration / Injection of Hip, Trochanteric Bursa, l Indications: Pain  Procedure Details Verbal consent obtained. Risks (including infection, potential atrophy), benefits, and alternatives reviewed. Greater trochanter sterilely prepped with Chloraprep. Ethyl Chloride used for  anesthesia. 8 cc of Lidocaine 1% injected with 2 mL of Depo-Medrol 40 mg into trochanteric bursa at area of maximal tenderness at greater trochanter. Needle taken to bone to troch bursa, flows easily. Bursa massaged. No bleeding and no complications. Decreased pain after injection. Needle: 22 gauge spinal needle Medication: 2 mL of Depo-Medrol 40 mg, equaling Depo-Medrol 80 mg total   Signed,  Ronnetta Currington T. Josselyn Harkins, MD

## 2019-07-14 ENCOUNTER — Other Ambulatory Visit: Payer: Self-pay

## 2019-07-14 MED ORDER — ATORVASTATIN CALCIUM 40 MG PO TABS: ORAL_TABLET | ORAL | 0 refills | Status: DC

## 2019-07-14 NOTE — Telephone Encounter (Signed)
E-scribed refill 

## 2019-07-25 ENCOUNTER — Other Ambulatory Visit: Payer: Self-pay

## 2019-07-25 ENCOUNTER — Ambulatory Visit: Payer: PPO

## 2019-07-25 ENCOUNTER — Ambulatory Visit (INDEPENDENT_AMBULATORY_CARE_PROVIDER_SITE_OTHER): Payer: PPO

## 2019-07-25 VITALS — HR 70

## 2019-07-25 DIAGNOSIS — Z0000 Encounter for general adult medical examination without abnormal findings: Secondary | ICD-10-CM

## 2019-07-25 NOTE — Patient Instructions (Signed)
Aaron Mitchell , Thank you for taking time to come for your Medicare Wellness Visit. I appreciate your ongoing commitment to your health goals. Please review the following plan we discussed and let me know if I can assist you in the future.   Screening recommendations/referrals: Colonoscopy: Cologuard completed 01/30/2018 Recommended yearly ophthalmology/optometry visit for glaucoma screening and checkup Recommended yearly dental visit for hygiene and checkup  Vaccinations: Influenza vaccine: Up to date, completed 06/12/2019 Pneumococcal vaccine: Completed series Tdap vaccine: Up to date, completed 08/23/2014 Shingles vaccine: Completed series    Advanced directives: copy in file per patient  Conditions/risks identified: diabetes, hypertension, hyperlipidemia  Next appointment: 07/27/2019 @ 8:15 am   Preventive Care 71 Years and Older, Male Preventive care refers to lifestyle choices and visits with your health care provider that can promote health and wellness. What does preventive care include?  A yearly physical exam. This is also called an annual well check.  Dental exams once or twice a year.  Routine eye exams. Ask your health care provider how often you should have your eyes checked.  Personal lifestyle choices, including:  Daily care of your teeth and gums.  Regular physical activity.  Eating a healthy diet.  Avoiding tobacco and drug use.  Limiting alcohol use.  Practicing safe sex.  Taking low doses of aspirin every day.  Taking vitamin and mineral supplements as recommended by your health care provider. What happens during an annual well check? The services and screenings done by your health care provider during your annual well check will depend on your age, overall health, lifestyle risk factors, and family history of disease. Counseling  Your health care provider may ask you questions about your:  Alcohol use.  Tobacco use.  Drug use.  Emotional  well-being.  Home and relationship well-being.  Sexual activity.  Eating habits.  History of falls.  Memory and ability to understand (cognition).  Work and work Statistician. Screening  You may have the following tests or measurements:  Height, weight, and BMI.  Blood pressure.  Lipid and cholesterol levels. These may be checked every 5 years, or more frequently if you are over 27 years old.  Skin check.  Lung cancer screening. You may have this screening every year starting at age 43 if you have a 30-pack-year history of smoking and currently smoke or have quit within the past 15 years.  Fecal occult blood test (FOBT) of the stool. You may have this test every year starting at age 6.  Flexible sigmoidoscopy or colonoscopy. You may have a sigmoidoscopy every 5 years or a colonoscopy every 10 years starting at age 42.  Prostate cancer screening. Recommendations will vary depending on your family history and other risks.  Hepatitis C blood test.  Hepatitis B blood test.  Sexually transmitted disease (STD) testing.  Diabetes screening. This is done by checking your blood sugar (glucose) after you have not eaten for a while (fasting). You may have this done every 1-3 years.  Abdominal aortic aneurysm (AAA) screening. You may need this if you are a current or former smoker.  Osteoporosis. You may be screened starting at age 71 if you are at high risk. Talk with your health care provider about your test results, treatment options, and if necessary, the need for more tests. Vaccines  Your health care provider may recommend certain vaccines, such as:  Influenza vaccine. This is recommended every year.  Tetanus, diphtheria, and acellular pertussis (Tdap, Td) vaccine. You may need a Td  booster every 10 years.  Zoster vaccine. You may need this after age 80.  Pneumococcal 13-valent conjugate (PCV13) vaccine. One dose is recommended after age 18.  Pneumococcal  polysaccharide (PPSV23) vaccine. One dose is recommended after age 88. Talk to your health care provider about which screenings and vaccines you need and how often you need them. This information is not intended to replace advice given to you by your health care provider. Make sure you discuss any questions you have with your health care provider. Document Released: 08/09/2015 Document Revised: 04/01/2016 Document Reviewed: 05/14/2015 Elsevier Interactive Patient Education  2017 [place] Prevention in the Home Falls can cause injuries. They can happen to people of all ages. There are many things you can do to make your home safe and to help prevent falls. What can I do on the outside of my home?  Regularly fix the edges of walkways and driveways and fix any cracks.  Remove anything that might make you trip as you walk through a door, such as a raised step or threshold.  Trim any bushes or trees on the path to your home.  Use bright outdoor lighting.  Clear any walking paths of anything that might make someone trip, such as rocks or tools.  Regularly check to see if handrails are loose or broken. Make sure that both sides of any steps have handrails.  Any raised decks and porches should have guardrails on the edges.  Have any leaves, snow, or ice cleared regularly.  Use sand or salt on walking paths during winter.  Clean up any spills in your garage right away. This includes oil or grease spills. What can I do in the bathroom?  Use night lights.  Install grab bars by the toilet and in the tub and shower. Do not use towel bars as grab bars.  Use non-skid mats or decals in the tub or shower.  If you need to sit down in the shower, use a plastic, non-slip stool.  Keep the floor dry. Clean up any water that spills on the floor as soon as it happens.  Remove soap buildup in the tub or shower regularly.  Attach bath mats securely with double-sided non-slip rug  tape.  Do not have throw rugs and other things on the floor that can make you trip. What can I do in the bedroom?  Use night lights.  Make sure that you have a light by your bed that is easy to reach.  Do not use any sheets or blankets that are too big for your bed. They should not hang down onto the floor.  Have a firm chair that has side arms. You can use this for support while you get dressed.  Do not have throw rugs and other things on the floor that can make you trip. What can I do in the kitchen?  Clean up any spills right away.  Avoid walking on wet floors.  Keep items that you use a lot in easy-to-reach places.  If you need to reach something above you, use a strong step stool that has a grab bar.  Keep electrical cords out of the way.  Do not use floor polish or wax that makes floors slippery. If you must use wax, use non-skid floor wax.  Do not have throw rugs and other things on the floor that can make you trip. What can I do with my stairs?  Do not leave any items on the stairs.  Make sure that there are handrails on both sides of the stairs and use them. Fix handrails that are broken or loose. Make sure that handrails are as long as the stairways.  Check any carpeting to make sure that it is firmly attached to the stairs. Fix any carpet that is loose or worn.  Avoid having throw rugs at the top or bottom of the stairs. If you do have throw rugs, attach them to the floor with carpet tape.  Make sure that you have a light switch at the top of the stairs and the bottom of the stairs. If you do not have them, ask someone to add them for you. What else can I do to help prevent falls?  Wear shoes that:  Do not have high heels.  Have rubber bottoms.  Are comfortable and fit you well.  Are closed at the toe. Do not wear sandals.  If you use a stepladder:  Make sure that it is fully opened. Do not climb a closed stepladder.  Make sure that both sides of the  stepladder are locked into place.  Ask someone to hold it for you, if possible.  Clearly mark and make sure that you can see:  Any grab bars or handrails.  First and last steps.  Where the edge of each step is.  Use tools that help you move around (mobility aids) if they are needed. These include:  Canes.  Walkers.  Scooters.  Crutches.  Turn on the lights when you go into a dark area. Replace any light bulbs as soon as they burn out.  Set up your furniture so you have a clear path. Avoid moving your furniture around.  If any of your floors are uneven, fix them.  If there are any pets around you, be aware of where they are.  Review your medicines with your doctor. Some medicines can make you feel dizzy. This can increase your chance of falling. Ask your doctor what other things that you can do to help prevent falls. This information is not intended to replace advice given to you by your health care provider. Make sure you discuss any questions you have with your health care provider. Document Released: 05/09/2009 Document Revised: 12/19/2015 Document Reviewed: 08/17/2014 Elsevier Interactive Patient Education  2017 [employer].

## 2019-07-25 NOTE — Progress Notes (Addendum)
PCP notes:  Health Maintenance: Needs foot exam    Abnormal Screenings: none   Patient concerns: none   Nurse concerns: none   Next PCP appt.: 07/27/2019 @ 8:15 am

## 2019-07-25 NOTE — Progress Notes (Signed)
Subjective:   Aaron Mitchell is a 73 y.o. male who presents for Medicare Annual/Subsequent preventive examination.  Review of Systems: N/A   This visit is being conducted through telemedicine via telephone at the nurse health advisor's home address due to the COVID-19 pandemic. This patient has given me verbal consent via doximity to conduct this visit, patient states they are participating from their home address. Patient and myself are on the telephone call. There is no referral for this visit. Some vital signs may be absent or patient reported.    Patient identification: identified by name, DOB, and current address   Cardiac Risk Factors include: advanced age (>29men, >68 women);diabetes mellitus;hypertension;dyslipidemia;male gender     Objective:    Vitals: Pulse 70   SpO2 99%   There is no height or weight on file to calculate BMI.  Advanced Directives 07/25/2019 07/05/2018 07/01/2017 08/05/2016 07/15/2016 12/28/2015 12/28/2015  Does Patient Have a Medical Advance Directive? Yes Yes Yes Yes Yes Yes No  Type of Paramedic of [place];Living will [place];Living will [place];Living will [place];Living will [place];Living will [place];Living will -  Does patient want to make changes to medical advance directive? - - - - - No - Patient declined -  Copy of [place] in Chart? Yes - validated most recent copy scanned in chart (See row information) No - copy requested No - copy requested Yes Yes Yes -  Would patient like information on creating a medical advance directive? - - - - - No - patient declined information No - patient declined information    Tobacco Social History   Tobacco Use  Smoking Status Former Smoker  . Quit date: 07/27/1978  . Years since quitting: 41.0  Smokeless Tobacco Never Used  Tobacco Comment   Quit in 1980       Counseling given: Not Answered Comment: Quit in 1980   Clinical Intake:  Pre-visit preparation completed: Yes  Pain : 0-10 Pain Score: 10-Worst pain ever Pain Type: Chronic pain Pain Location: (all over body (joint pain)) Pain Descriptors / Indicators: Aching Pain Onset: More than a month ago Pain Frequency: Intermittent     Nutritional Risks: None Diabetes: Yes CBG done?: No Did pt. bring in CBG monitor from home?: No  How often do you need to have someone help you when you read instructions, pamphlets, or other written materials from your doctor or pharmacy?: 1 - Never What is the last grade level you completed in school?: college  Interpreter Needed?: No  Information entered by :: CJohnson, LPN  Past Medical History:  Diagnosis Date  . Achilles tendinitis    Left leg  . Borderline diabetes mellitus   . Childhood asthma   . Degenerative disc disease, lumbar    rec against surgery  . Erectile dysfunction   . GI bleed 12/2015   hospitalization  . Gout   . Hypertension   . Ileus ([place]) 12/2015   hospitalization   . Male hypogonadism    followed by urology on T injections ([place])  . Seasonal allergies    pollen  . Severe obesity (BMI >= 40) ([facility])   . Shoulder bursitis   . Sleep apnea    CPAP   Past Surgical History:  Procedure Laterality Date  . ADENOIDECTOMY    . CARPAL TUNNEL RELEASE Right 2015  . CATARACT EXTRACTION W/PHACO Right 07/15/2016   Procedure: CATARACT EXTRACTION  PHACO AND INTRAOCULAR LENS PLACEMENT (IOC);  Surgeon: Leandrew Koyanagi, MD;  Location: [facility];  Service: Ophthalmology;  Laterality: Right;  TORIC sleep apnea  . CATARACT EXTRACTION W/PHACO Left 08/05/2016   Procedure: CATARACT EXTRACTION PHACO AND INTRAOCULAR LENS PLACEMENT (IOC) toric;  Surgeon: Leandrew Koyanagi, MD;  Location: [facility];  Service: Ophthalmology;  Laterality: Left;  left sleep apnea toric  . COLONOSCOPY  10/2012   rpt 5 yrs ([facility])  . EYE SURGERY     Lens replacement 07/12/2016,08/05/2016  . LUMBAR EPIDURAL INJECTION Bilateral 04/2017, 12/2017   S1 transforaminal ESI ([place])  . RHINOPLASTY    . TONSILLECTOMY  ?1954  . TRIGGER FINGER RELEASE Right 23-Oct-2013   4th   Family History  Problem Relation Age of Onset  . Alcohol abuse Father   . Hypertension Father   . AVM Father 10       hemorrhagic stroke  . Diabetes Paternal Grandmother   . Cancer Neg Hx   . CAD Neg Hx   . Prostate cancer Neg Hx   . Bladder Cancer Neg Hx   . Kidney cancer Neg Hx    Social History   Socioeconomic History  . Marital status: Married    Spouse name: Not on file  . Number of children: Not on file  . Years of education: Not on file  . Highest education level: Not on file  Occupational History  . Not on file  Tobacco Use  . Smoking status: Former Smoker    Quit date: 07/27/1978    Years since quitting: 41.0  . Smokeless tobacco: Never Used  . Tobacco comment: Quit in 10/24/1978  Substance and Sexual Activity  . Alcohol use: Yes    Alcohol/week: 5.0 standard drinks    Types: 5 Shots of liquor per week    Comment:    . Drug use: No  . Sexual activity: Not on file  Other Topics Concern  . Not on file  Social History Narrative   Lives at home with wife [name]), dog passed away Oct 24, 2015   1 grown child   Occ: retired Clinical biochemist, was Chief Technology Officer    Edu: 2 yrs college   Activity: no regular exercise   Diet: good water, fruits daily, splenda iced tea   Social Determinants of Health   Financial Resource Strain: Low Risk   . Difficulty of Paying Living Expenses: Not hard at all  Food Insecurity: No Food Insecurity  . Worried About Charity fundraiser in the Last Year: Never true  . Ran Out of Food in the Last Year: Never true  Transportation Needs: No Transportation Needs  . Lack of Transportation (Medical): No  . Lack of Transportation (Non-Medical): No  Physical Activity: Inactive  . Days of Exercise per Week: 0 days    . Minutes of Exercise per Session: 0 min  Stress: No Stress Concern Present  . Feeling of Stress : Not at all  Social Connections:   . Frequency of Communication with Friends and Family: Not on file  . Frequency of Social Gatherings with Friends and Family: Not on file  . Attends Religious Services: Not on file  . Active Member of Clubs or Organizations: Not on file  . Attends Archivist Meetings: Not on file  . Marital Status: Not on file    Outpatient Encounter Medications as of 07/25/2019  Medication Sig  . albuterol (PROVENTIL) (2.5 MG/3ML) 0.083% nebulizer solution Take 3 mLs (2.5 mg total)  by nebulization every 6 (six) hours as needed for wheezing or shortness of breath.  [name] Kitchen albuterol (VENTOLIN HFA) 108 (90 Base) MCG/ACT inhaler Inhale 2 puffs into the lungs every 6 (six) hours as needed for wheezing or shortness of breath.  . allopurinol (ZYLOPRIM) 300 MG tablet TAKE 1 TABLET BY MOUTH DAILY  . amLODipine (NORVASC) 5 MG tablet TAKE 1 TABLET(5 MG) BY MOUTH TWICE DAILY  . atorvastatin (LIPITOR) 40 MG tablet TAKE 1 TABLET(40 MG) BY MOUTH DAILY  . diclofenac (VOLTAREN) 75 MG EC tablet TAKE 1 TABLET BY MOUTH TWICE DAILY  . diclofenac sodium (VOLTAREN) 1 % GEL Apply 2 g topically daily as needed. Apply to both hands.  . fluticasone (FLONASE) 50 MCG/ACT nasal spray SHAKE LIQUID AND USE 2 SPRAYS IN EACH NOSTRIL DAILY  . metaxalone (SKELAXIN) 800 MG tablet Take 1 tablet (800 mg total) by mouth 3 (three) times daily as needed for muscle spasms.  . Multiple Vitamins tablet Take 1 tablet by mouth daily.  . naproxen sodium (ANAPROX) 220 MG tablet Take 440 mg by mouth daily.   . sildenafil (VIAGRA) 100 MG tablet Take by mouth.  . tadalafil (CIALIS) 20 MG tablet Take 20 mg by mouth daily as needed for erectile dysfunction.  [name] Kitchen testosterone cypionate (DEPOTESTOSTERONE CYPIONATE) 200 MG/ML injection Inject 0.5 mLs (100 mg total) into the muscle once a week.  . tiotropium (SPIRIVA  HANDIHALER) 18 MCG inhalation capsule Place 1 capsule (18 mcg total) into inhaler and inhale daily.  . traMADol (ULTRAM) 50 MG tablet TAKE 1 TABLET BY MOUTH EVERY 8 HOURS AS NEEDED FOR MODERATE PAIN  . triamterene-hydrochlorothiazide (MAXZIDE-25) 37.5-25 MG tablet Take 1 tablet by mouth daily.  [name] Kitchen zolpidem (AMBIEN) 10 MG tablet TAKE 1 TABLET(10 MG) BY MOUTH AT BEDTIME AS NEEDED FOR SLEEP   No facility-administered encounter medications on file as of 07/25/2019.    Activities of Daily Living In your present state of health, do you have any difficulty performing the following activities: 07/25/2019  Hearing? N  Vision? N  Difficulty concentrating or making decisions? N  Walking or climbing stairs? N  Dressing or bathing? N  Doing errands, shopping? N  Preparing Food and eating ? N  Using the Toilet? N  In the past six months, have you accidently leaked urine? N  Do you have problems with loss of bowel control? N  Managing your Medications? N  Managing your Finances? N  Housekeeping or managing your Housekeeping? N  Some recent data might be hidden    Patient Care Team: [name], MD as PCP - General (Family Medicine) Emmaline Kluver., MD (Rheumatology) Leandrew Koyanagi, MD as Referring Physician (Ophthalmology) Beverly Gust, MD (Otolaryngology)   Assessment:   This is a routine wellness examination for [place].  Exercise Activities and Dietary recommendations Current Exercise Habits: The patient does not participate in regular exercise at present, Exercise limited by: None identified  Goals    . Patient Stated     Starting 07/05/2018, I will continue to take medications as prescribed.     . Patient Stated     07/25/2019, I will maintain and continue medications as prescribed.        Fall Risk Fall Risk  07/25/2019 07/05/2018 07/01/2017 03/30/2017  Falls in the past year? 1 0 No No  Comment tripped and fell - - -  Number falls in past yr: 0 - - -    Injury with Fall? 0 - - -  Risk for fall due  to : Medication side effect - - -  Follow up Falls evaluation completed;Falls prevention discussed - - -   Is the patient's home free of loose throw rugs in walkways, pet beds, electrical cords, etc?   yes      Grab bars in the bathroom? no      Handrails on the stairs?   yes      Adequate lighting?   yes  Timed Get Up and Go Performed: N/A  Depression Screen PHQ 2/9 Scores 07/25/2019 07/05/2018 07/01/2017 03/30/2017  PHQ - 2 Score 0 0 0 0  PHQ- 9 Score 0 0 0 -    Cognitive Function MMSE - Mini Mental State Exam 07/25/2019 07/05/2018 07/01/2017  Orientation to time 5 5 5   Orientation to Place 5 5 5   Registration 3 3 3   Attention/ Calculation 5 0 0  Recall 3 3 1   Recall-comments - - unable to recall 2 of 3 words  Language- name 2 objects - 0 0  Language- repeat 1 1 1   Language- follow 3 step command - 3 3  Language- read & follow direction - 0 0  Write a sentence - 0 0  Copy design - 0 0  Total score - 20 18  Mini Cog  Mini-Cog screen was completed. Maximum score is 22. A value of 0 denotes this part of the MMSE was not completed or the patient failed this part of the Mini-Cog screening.       Immunization History  Administered Date(s) Administered  . Influenza, High Dose Seasonal PF 05/14/2017, 04/07/2018  . Influenza, Quadrivalent, Recombinant, Inj, Pf 03/20/2019, 06/12/2019  . Influenza-Unspecified 08/27/2016  . Pneumococcal Conjugate-13 08/23/2014, 03/20/2019  . Pneumococcal Polysaccharide-23 03/26/2016  . Td 08/23/2014  . Tdap 08/23/2014  . Zoster 08/08/2013  . Zoster Recombinat (Shingrix) 03/14/2018, 05/16/2018    Qualifies for Shingles Vaccine? Completed series   Screening Tests Health Maintenance  Topic Date Due  . DTAP VACCINES (1) 05/27/1946  . FOOT EXAM  03/27/1958  . URINE MICROALBUMIN  03/27/1958  . HEMOGLOBIN A1C  11/29/2019  . OPHTHALMOLOGY EXAM  01/09/2020  . Fecal DNA (Cologuard)  01/30/2021  .  DTaP/Tdap/Td (2 - Tdap) 08/23/2024  . TETANUS/TDAP  08/23/2024  . INFLUENZA VACCINE  Completed  . Hepatitis C Screening  Completed  . PNA vac Low Risk Adult  Completed   Cancer Screenings: Lung: Low Dose CT Chest recommended if Age 4-80 years, 30 pack-year currently smoking OR have quit w/in 15 years. Patient does not qualify. Colorectal: Cologuard completed 01/30/2018  Additional Screenings:  Hepatitis C Screening: 12/25/2016      Plan:    Patient will maintain and continue medications as prescribed.   I have personally reviewed and noted the following in the patient's chart:   . Medical and social history . Use of alcohol, tobacco or illicit drugs  . Current medications and supplements . Functional ability and status . Nutritional status . Physical activity . Advanced directives . List of other physicians . Hospitalizations, surgeries, and ER visits in previous 12 months . Vitals . Screenings to include cognitive, depression, and falls . Referrals and appointments  In addition, I have reviewed and discussed with patient certain preventive protocols, quality metrics, and best practice recommendations. A written personalized care plan for preventive services as well as general preventive health recommendations were provided to patient.     Andrez Grime, LPN  [phone]

## 2019-07-27 ENCOUNTER — Other Ambulatory Visit: Payer: Self-pay

## 2019-07-27 ENCOUNTER — Encounter: Payer: Self-pay | Admitting: Family Medicine

## 2019-07-27 ENCOUNTER — Ambulatory Visit (INDEPENDENT_AMBULATORY_CARE_PROVIDER_SITE_OTHER): Payer: PPO | Admitting: Family Medicine

## 2019-07-27 VITALS — BP 140/78 | HR 64 | Temp 97.8°F | Ht 69.0 in | Wt 270.4 lb

## 2019-07-27 DIAGNOSIS — Z0000 Encounter for general adult medical examination without abnormal findings: Secondary | ICD-10-CM | POA: Diagnosis not present

## 2019-07-27 DIAGNOSIS — G4700 Insomnia, unspecified: Secondary | ICD-10-CM | POA: Diagnosis not present

## 2019-07-27 DIAGNOSIS — R519 Headache, unspecified: Secondary | ICD-10-CM

## 2019-07-27 DIAGNOSIS — E785 Hyperlipidemia, unspecified: Secondary | ICD-10-CM | POA: Diagnosis not present

## 2019-07-27 DIAGNOSIS — J449 Chronic obstructive pulmonary disease, unspecified: Secondary | ICD-10-CM | POA: Diagnosis not present

## 2019-07-27 DIAGNOSIS — R7303 Prediabetes: Secondary | ICD-10-CM | POA: Diagnosis not present

## 2019-07-27 DIAGNOSIS — I10 Essential (primary) hypertension: Secondary | ICD-10-CM | POA: Diagnosis not present

## 2019-07-27 DIAGNOSIS — E1169 Type 2 diabetes mellitus with other specified complication: Secondary | ICD-10-CM

## 2019-07-27 DIAGNOSIS — E113299 Type 2 diabetes mellitus with mild nonproliferative diabetic retinopathy without macular edema, unspecified eye: Secondary | ICD-10-CM | POA: Diagnosis not present

## 2019-07-27 DIAGNOSIS — Z7189 Other specified counseling: Secondary | ICD-10-CM | POA: Diagnosis not present

## 2019-07-27 DIAGNOSIS — G4733 Obstructive sleep apnea (adult) (pediatric): Secondary | ICD-10-CM | POA: Diagnosis not present

## 2019-07-27 LAB — COMPREHENSIVE METABOLIC PANEL
ALT: 32 U/L (ref 0–53)
AST: 26 U/L (ref 0–37)
Albumin: 4.2 g/dL (ref 3.5–5.2)
Alkaline Phosphatase: 52 U/L (ref 39–117)
BUN: 18 mg/dL (ref 6–23)
CO2: 31 mEq/L (ref 19–32)
Calcium: 9.2 mg/dL (ref 8.4–10.5)
Chloride: 99 mEq/L (ref 96–112)
Creatinine, Ser: 1.18 mg/dL (ref 0.40–1.50)
GFR: 60.8 mL/min (ref >60.00)
Glucose, Bld: 117 mg/dL — ABNORMAL HIGH (ref 70–99)
Potassium: 3.9 mEq/L (ref 3.5–5.1)
Sodium: 138 mEq/L (ref 135–145)
Total Bilirubin: 1.1 mg/dL (ref 0.2–1.2)
Total Protein: 6.6 g/dL (ref 6.0–8.3)

## 2019-07-27 LAB — LIPID PANEL
Cholesterol: 111 mg/dL (ref 0–200)
HDL: 47.2 mg/dL (ref >39.00)
LDL Cholesterol: 37 mg/dL (ref 0–99)
NonHDL: 63.56
Total CHOL/HDL Ratio: 2
Triglycerides: 133 mg/dL (ref 0.0–149.0)
VLDL: 26.6 mg/dL (ref 0.0–40.0)

## 2019-07-27 LAB — MICROALBUMIN / CREATININE URINE RATIO
Creatinine,U: 161.1 mg/dL
Microalb Creat Ratio: 7.4 mg/g (ref 0.0–30.0)
Microalb, Ur: 12 mg/dL — ABNORMAL HIGH (ref 0.0–1.9)

## 2019-07-27 NOTE — Assessment & Plan Note (Signed)
Stable period.  

## 2019-07-27 NOTE — Progress Notes (Signed)
This visit was conducted in person.  BP 140/78 (BP Location: Left Arm, Patient Position: Sitting, Cuff Size: Large)   Pulse 64   Temp 97.8 F (36.6 C) (Temporal)   Ht 5\' 9"  (1.753 m)   Wt 270 lb 6 oz (122.6 kg)   SpO2 97%   BMI 39.93 kg/m    CC: CPE Subjective:    Patient ID: Aaron Mitchell, male    DOB: Nov 29, 1945, 73 y.o.   MRN: [id number]  HPI: Aaron Mitchell is a 73 y.o. male presenting on 07/27/2019 for Annual Exam (Prt 2. )   Saw health advisor this week for medicare wellness visit. Note reviewed.    No exam data present    Clinical Support from 07/25/2019 in [place] [facility] Total Score  0      Fall Risk  07/25/2019 07/05/2018 07/01/2017 03/30/2017  Falls in the past year? 1 0 No No  Comment tripped and fell - - -  Number falls in past yr: 0 - - -  Injury with Fall? 0 - - -  Risk for fall due to : Medication side effect - - -  Follow up Falls evaluation completed;Falls prevention discussed - - -      Preventative: COLONOSCOPY4/2014rpt 5 yrs ([facility]). Cologuard 01/2018 WNL.  Prostate cancer screening -followed by urologist Diamantina [facility])  Lung cancer screening -ex smoker quit 1980  Flu shotyearly Td 11/05/2014  prevnar Nov 05, 2014, pneumovax November 05, 2015  zostavax 11-04-2013 shingrix - completed 04/2018  Advanced directive - scanned and in chart 08/2018. Would want wife [name] then [name] to be HCPOA. Does not want prolonged life support if terminal condition. Unsure about feeding tube. Seat belt use discussed  Sunscreen use and skin screen discussed Ex smoker quit remotely [place]. 2.5 ppd x 15 yrs Alcohol - 3 mixed drinks/wk Dentist q6 mo  Eye exam yearly  Bowel - no constipation Bladder - no incontinence  Lives at home with wife [name]), dog passed away 2015/11/05 1 grown child Occ: retired Clinical biochemist, was Chief Technology Officer  Edu: 2 yrs college Activity: no regular exercise  Diet: good water,fruitsdaily, splenda iced teaand coffee     Relevant  past medical, surgical, family and social history reviewed and updated as indicated. Interim medical history since our last visit reviewed. Allergies and medications reviewed and updated. Outpatient Medications Prior to Visit  Medication Sig Dispense Refill  . albuterol (PROVENTIL) (2.5 MG/3ML) 0.083% nebulizer solution Take 3 mLs (2.5 mg total) by nebulization every 6 (six) hours as needed for wheezing or shortness of breath. 90 mL 3  . albuterol (VENTOLIN HFA) 108 (90 Base) MCG/ACT inhaler Inhale 2 puffs into the lungs every 6 (six) hours as needed for wheezing or shortness of breath. 3 Inhaler 3  . allopurinol (ZYLOPRIM) 300 MG tablet TAKE 1 TABLET BY MOUTH DAILY 90 tablet 1  . amLODipine (NORVASC) 5 MG tablet TAKE 1 TABLET(5 MG) BY MOUTH TWICE DAILY 180 tablet 0  . atorvastatin (LIPITOR) 40 MG tablet TAKE 1 TABLET(40 MG) BY MOUTH DAILY 90 tablet 0  . diclofenac (VOLTAREN) 75 MG EC tablet TAKE 1 TABLET BY MOUTH TWICE DAILY 180 tablet 0  . diclofenac sodium (VOLTAREN) 1 % GEL Apply 2 g topically daily as needed. Apply to both hands.    . fluticasone (FLONASE) 50 MCG/ACT nasal spray SHAKE LIQUID AND USE 2 SPRAYS IN EACH NOSTRIL DAILY 48 g 3  . metaxalone (SKELAXIN) 800 MG tablet Take 1 tablet (800 mg total) by mouth  3 (three) times daily as needed for muscle spasms. 270 tablet 0  . Multiple Vitamins tablet Take 1 tablet by mouth daily.    . naproxen sodium (ANAPROX) 220 MG tablet Take 440 mg by mouth daily.     . sildenafil (VIAGRA) 100 MG tablet Take by mouth.    . tadalafil (CIALIS) 20 MG tablet Take 20 mg by mouth daily as needed for erectile dysfunction.    [name] Kitchen testosterone cypionate (DEPOTESTOSTERONE CYPIONATE) 200 MG/ML injection Inject 0.5 mLs (100 mg total) into the muscle once a week. 10 mL 0  . tiotropium (SPIRIVA HANDIHALER) 18 MCG inhalation capsule Place 1 capsule (18 mcg total) into inhaler and inhale daily. 90 capsule 3  . traMADol (ULTRAM) 50 MG tablet TAKE 1 TABLET BY MOUTH EVERY 8  HOURS AS NEEDED FOR MODERATE PAIN 30 tablet 0  . triamterene-hydrochlorothiazide (MAXZIDE-25) 37.5-25 MG tablet Take 1 tablet by mouth daily. 30 tablet 6  . zolpidem (AMBIEN) 10 MG tablet TAKE 1 TABLET(10 MG) BY MOUTH AT BEDTIME AS NEEDED FOR SLEEP 30 tablet 0   No facility-administered medications prior to visit.     Per HPI unless specifically indicated in ROS section below Review of Systems  Constitutional: Negative for activity change, appetite change, chills, fatigue, fever and unexpected weight change.  HENT: Negative for hearing loss.   Eyes: Negative for visual disturbance.  Respiratory: Positive for shortness of breath. Negative for cough, chest tightness and wheezing.   Cardiovascular: Negative for chest pain, palpitations and leg swelling.  Gastrointestinal: Negative for abdominal distention, abdominal pain, blood in stool, constipation, diarrhea, nausea and vomiting.  Genitourinary: Negative for difficulty urinating and hematuria.  Musculoskeletal: Negative for arthralgias, myalgias and neck pain.  Skin: Negative for rash.  Neurological: Negative for dizziness, seizures, syncope and headaches.  Hematological: Negative for adenopathy. Bruises/bleeds easily.  Psychiatric/Behavioral: Negative for dysphoric mood. The patient is not nervous/anxious.    Objective:    BP 140/78 (BP Location: Left Arm, Patient Position: Sitting, Cuff Size: Large)   Pulse 64   Temp 97.8 F (36.6 C) (Temporal)   Ht 5\' 9"  (1.753 m)   Wt 270 lb 6 oz (122.6 kg)   SpO2 97%   BMI 39.93 kg/m   Wt Readings from Last 3 Encounters:  07/27/19 270 lb 6 oz (122.6 kg)  07/12/19 277 lb 4 oz (125.8 kg)  06/01/19 276 lb 5 oz (125.3 kg)    Physical Exam Vitals and nursing note reviewed.  Constitutional:      General: He is not in acute distress.    Appearance: Normal appearance. He is well-developed. He is obese. He is not ill-appearing.  HENT:     Head: Normocephalic and atraumatic.     Right Ear:  Hearing, tympanic membrane, ear canal and external ear normal.     Left Ear: Hearing, tympanic membrane, ear canal and external ear normal.     Mouth/Throat:     Pharynx: Uvula midline.  Eyes:     General: No scleral icterus.    Extraocular Movements: Extraocular movements intact.     Conjunctiva/sclera: Conjunctivae normal.     Pupils: Pupils are equal, round, and reactive to light.  Cardiovascular:     Rate and Rhythm: Normal rate and regular rhythm.     Pulses: Normal pulses.          Radial pulses are 2+ on the right side and 2+ on the left side.     Heart sounds: Normal heart sounds. No murmur.  Pulmonary:     Effort: Pulmonary effort is normal. No respiratory distress.     Breath sounds: Normal breath sounds. No wheezing, rhonchi or rales.  Abdominal:     General: Abdomen is flat. Bowel sounds are normal. There is no distension.     Palpations: Abdomen is soft. There is no mass.     Tenderness: There is no abdominal tenderness. There is no guarding or rebound.     Hernia: No hernia is present.  Musculoskeletal:        General: Normal range of motion.     Cervical back: Normal range of motion and neck supple.     Right lower leg: No edema.     Left lower leg: No edema.  Lymphadenopathy:     Cervical: No cervical adenopathy.  Skin:    General: Skin is warm and dry.     Findings: No rash.  Neurological:     General: No focal deficit present.     Mental Status: He is alert and oriented to person, place, and time.     Comments: CN grossly intact, station and gait intact  Psychiatric:        Mood and Affect: Mood normal.        Behavior: Behavior normal.        Thought Content: Thought content normal.        Judgment: Judgment normal.       Results for orders placed or performed in visit on 06/01/19  Hemoglobin A1c  Result Value Ref Range   Hgb A1c MFr Bld 6.4 4.6 - 6.5 %  CBC with Differential  Result Value Ref Range   WBC 6.0 4.0 - 10.5 K/uL   RBC 5.66 4.22 - 5.81  Mil/uL   Hemoglobin 17.3 (H) 13.0 - 17.0 g/dL   HCT 51.1 39.0 - 52.0 %   MCV 90.3 78.0 - 100.0 fl   MCHC 33.9 30.0 - 36.0 g/dL   RDW 14.3 11.5 - 15.5 %   Platelets 155.0 150.0 - 400.0 K/uL   Neutrophils Relative % 61.9 43.0 - 77.0 %   Lymphocytes Relative 27.0 12.0 - 46.0 %   Monocytes Relative 8.1 3.0 - 12.0 %   Eosinophils Relative 2.5 0.0 - 5.0 %   Basophils Relative 0.5 0.0 - 3.0 %   Neutro Abs 3.7 1.4 - 7.7 K/uL   Lymphs Abs 1.6 0.7 - 4.0 K/uL   Monocytes Absolute 0.5 0.1 - 1.0 K/uL   Eosinophils Absolute 0.2 0.0 - 0.7 K/uL   Basophils Absolute 0.0 0.0 - 0.1 K/uL  Basic metabolic panel  Result Value Ref Range   Sodium 138 135 - 145 mEq/L   Potassium 3.9 3.5 - 5.1 mEq/L   Chloride 98 96 - 112 mEq/L   CO2 31 19 - 32 mEq/L   Glucose, Bld 208 (H) 70 - 99 mg/dL   BUN 19 6 - 23 mg/dL   Creatinine, Ser 1.35 0.40 - 1.50 mg/dL   GFR 52.07 (L) >60.00 mL/min   Calcium 9.0 8.4 - 10.5 mg/dL  Sedimentation rate  Result Value Ref Range   Sed Rate 6 0 - 20 mm/hr   Assessment & Plan:  This visit occurred during the SARS-CoV-2 public health emergency.  Safety protocols were in place, including screening questions prior to the visit, additional usage of staff PPE, and extensive cleaning of exam room while observing appropriate contact time as indicated for disinfecting solutions.   Problem List Items Addressed This Visit  Stage 3 severe COPD by GOLD classification ([place])    Continue spiriva.       Severe obesity (BMI 35.0-39.9) with comorbidity ([place])    Weight loss noted. Continued to encourage healthy diet and lifestyle changes to affect sustainable weight loss.       Right facial pain    Found to be jaw strain from eating crunchy peanuts - improved once he stopped this activity.       Prediabetes    A1c in prediabetes range however recent eye exam with evidence of background diabetic retinopathy. Encouraged adherence to low sugar low carb diabetic diet. RTC 6 mo f/u visit.        OSA (obstructive sleep apnea)    Continues using CPAP regularly with benefit.       Insomnia    Stable period.       Hyperlipidemia associated with type 2 diabetes mellitus (HCC)    Chronic, stable on atorvastatin. Update FLP today.       Health maintenance examination - Primary    Preventative protocols reviewed and updated unless pt declined. Discussed healthy diet and lifestyle.       Essential (primary) hypertension    Chronic, stable. Continue current regimen.      Relevant Orders   Microalbumin / creatinine urine ratio   Background diabetic retinopathy ([place])    Regularly sees eye doctor.       Advanced care planning/counseling discussion    Advanced directive - scanned and in chart 08/2018. Would want wife [name] then [name] to be HCPOA. Does not want prolonged life support if terminal condition. Unsure about feeding tube.          No orders of the defined types were placed in this encounter.  Orders Placed This Encounter  Procedures  . Lipid panel  . Comprehensive metabolic panel  . Microalbumin / creatinine urine ratio    Patient instructions: Labs and urine test today.  Sugar levels creeping up. Increase water.  Work on regular exercise routine.  You are doing well today Return as needed or in 6 months for follow up visit.   Follow up plan: Return in about 6 months (around 01/24/2020) for follow up visit.  [name], MD

## 2019-07-27 NOTE — Assessment & Plan Note (Signed)
Found to be jaw strain from eating crunchy peanuts - improved once he stopped this activity.

## 2019-07-27 NOTE — Assessment & Plan Note (Signed)
Regularly sees eye doctor.  

## 2019-07-27 NOTE — Assessment & Plan Note (Signed)
A1c in prediabetes range however recent eye exam with evidence of background diabetic retinopathy. Encouraged adherence to low sugar low carb diabetic diet. RTC 6 mo f/u visit.

## 2019-07-27 NOTE — Assessment & Plan Note (Signed)
Advanced directive - scanned and in chart 08/2018. Would want wife [name] then [name] to be HCPOA. Does not want prolonged life support if terminal condition. Unsure about feeding tube.

## 2019-07-27 NOTE — Assessment & Plan Note (Signed)
Preventative protocols reviewed and updated unless pt declined. Discussed healthy diet and lifestyle.  

## 2019-07-27 NOTE — Patient Instructions (Addendum)
Labs and urine test today.  Sugar levels creeping up. Increase water.  Work on regular exercise routine.  You are doing well today Return as needed or in 6 months for follow up visit.   Health Maintenance After Age 71 After age 20, you are at a higher risk for certain long-term diseases and infections as well as injuries from falls. Falls are a major cause of broken bones and head injuries in people who are older than age 71. Getting regular preventive care can help to keep you healthy and well. Preventive care includes getting regular testing and making lifestyle changes as recommended by your health care provider. Talk with your health care provider about:  Which screenings and tests you should have. A screening is a test that checks for a disease when you have no symptoms.  A diet and exercise plan that is right for you. What should I know about screenings and tests to prevent falls? Screening and testing are the best ways to find a health problem early. Early diagnosis and treatment give you the best chance of managing medical conditions that are common after age 68. Certain conditions and lifestyle choices may make you more likely to have a fall. Your health care provider may recommend:  Regular vision checks. Poor vision and conditions such as cataracts can make you more likely to have a fall. If you wear glasses, make sure to get your prescription updated if your vision changes.  Medicine review. Work with your health care provider to regularly review all of the medicines you are taking, including over-the-counter medicines. Ask your health care provider about any side effects that may make you more likely to have a fall. Tell your health care provider if any medicines that you take make you feel dizzy or sleepy.  Osteoporosis screening. Osteoporosis is a condition that causes the bones to get weaker. This can make the bones weak and cause them to break more easily.  Blood pressure  screening. Blood pressure changes and medicines to control blood pressure can make you feel dizzy.  Strength and balance checks. Your health care provider may recommend certain tests to check your strength and balance while standing, walking, or changing positions.  Foot health exam. Foot pain and numbness, as well as not wearing proper footwear, can make you more likely to have a fall.  Depression screening. You may be more likely to have a fall if you have a fear of falling, feel emotionally low, or feel unable to do activities that you used to do.  Alcohol use screening. Using too much alcohol can affect your balance and may make you more likely to have a fall. What actions can I take to lower my risk of falls? General instructions  Talk with your health care provider about your risks for falling. Tell your health care provider if: ? You fall. Be sure to tell your health care provider about all falls, even ones that seem minor. ? You feel dizzy, sleepy, or off-balance.  Take over-the-counter and prescription medicines only as told by your health care provider. These include any supplements.  Eat a healthy diet and maintain a healthy weight. A healthy diet includes low-fat dairy products, low-fat (lean) meats, and fiber from whole grains, beans, and lots of fruits and vegetables. Home safety  Remove any tripping hazards, such as rugs, cords, and clutter.  Install safety equipment such as grab bars in bathrooms and safety rails on stairs.  Keep rooms and walkways well-lit. Activity  Follow a regular exercise program to stay fit. This will help you maintain your balance. Ask your health care provider what types of exercise are appropriate for you.  If you need a cane or walker, use it as recommended by your health care provider.  Wear supportive shoes that have nonskid soles. Lifestyle  Do not drink alcohol if your health care provider tells you not to drink.  If you drink  alcohol, limit how much you have: ? 0-1 drink a day for women. ? 0-2 drinks a day for men.  Be aware of how much alcohol is in your drink. In the [place] one drink equals one typical bottle of beer (12 oz), one-half glass of wine (5 oz), or one shot of hard liquor (1 oz).  Do not use any products that contain nicotine or tobacco, such as cigarettes and e-cigarettes. If you need help quitting, ask your health care provider. Summary  Having a healthy lifestyle and getting preventive care can help to protect your health and wellness after age 88.  Screening and testing are the best way to find a health problem early and help you avoid having a fall. Early diagnosis and treatment give you the best chance for managing medical conditions that are more common for people who are older than age 70.  Falls are a major cause of broken bones and head injuries in people who are older than age 46. Take precautions to prevent a fall at home.  Work with your health care provider to learn what changes you can make to improve your health and wellness and to prevent falls. This information is not intended to replace advice given to you by your health care provider. Make sure you discuss any questions you have with your health care provider. Document Revised: 11/03/2018 Document Reviewed: 05/26/2017 Elsevier Patient Education  2020 [employer].

## 2019-07-27 NOTE — Assessment & Plan Note (Signed)
Chronic, stable on atorvastatin. Update FLP today.

## 2019-07-27 NOTE — Assessment & Plan Note (Signed)
Weight loss noted. Continued to encourage healthy diet and lifestyle changes to affect sustainable weight loss.

## 2019-07-27 NOTE — Assessment & Plan Note (Signed)
Continue spiriva.

## 2019-07-27 NOTE — Assessment & Plan Note (Signed)
Chronic, stable. Continue current regimen. 

## 2019-07-27 NOTE — Assessment & Plan Note (Signed)
Continues using CPAP regularly with benefit.

## 2019-07-31 ENCOUNTER — Encounter: Payer: Self-pay | Admitting: Family Medicine

## 2019-08-24 ENCOUNTER — Other Ambulatory Visit: Payer: Self-pay | Admitting: Family Medicine

## 2019-08-30 DIAGNOSIS — M5416 Radiculopathy, lumbar region: Secondary | ICD-10-CM | POA: Diagnosis not present

## 2019-08-30 DIAGNOSIS — M5126 Other intervertebral disc displacement, lumbar region: Secondary | ICD-10-CM | POA: Diagnosis not present

## 2019-09-05 ENCOUNTER — Telehealth: Payer: Self-pay | Admitting: Family Medicine

## 2019-09-05 NOTE — Chronic Care Management (AMB) (Signed)
  Chronic Care Management   Note  09/05/2019 Name: Zekiel Torian MRN: [id number] DOB: Jan 02, 1946  Kani Chauvin is a 74 y.o. year old male who is a primary care patient of [name], MD. I reached out to [name] by phone today in response to a referral sent by Mr. Damien Piazza's PCP, [name], MD.   Mr. Dowdy was given information about Chronic Care Management services today including:  1. CCM service includes personalized support from designated clinical staff supervised by his physician, including individualized plan of care and coordination with other care providers 2. 24/7 contact phone numbers for assistance for urgent and routine care needs. 3. Service will only be billed when office clinical staff spend 20 minutes or more in a month to coordinate care. 4. Only one practitioner may furnish and bill the service in a calendar month. 5. The patient may stop CCM services at any time (effective at the end of the month) by phone call to the office staff. 6. The patient will be responsible for cost sharing (co-pay) of up to 20% of the service fee (after annual deductible is met).  Patient agreed to services and verbal consent obtained.   Follow up plan:   Raynicia Dukes [employer] Scheduler

## 2019-09-06 ENCOUNTER — Ambulatory Visit: Payer: PPO

## 2019-09-06 ENCOUNTER — Other Ambulatory Visit: Payer: Self-pay

## 2019-09-06 DIAGNOSIS — G8929 Other chronic pain: Secondary | ICD-10-CM

## 2019-09-06 DIAGNOSIS — E1169 Type 2 diabetes mellitus with other specified complication: Secondary | ICD-10-CM

## 2019-09-06 DIAGNOSIS — R7303 Prediabetes: Secondary | ICD-10-CM

## 2019-09-06 DIAGNOSIS — M159 Polyosteoarthritis, unspecified: Secondary | ICD-10-CM

## 2019-09-06 DIAGNOSIS — M8949 Other hypertrophic osteoarthropathy, multiple sites: Secondary | ICD-10-CM

## 2019-09-06 DIAGNOSIS — J302 Other seasonal allergic rhinitis: Secondary | ICD-10-CM

## 2019-09-06 DIAGNOSIS — E291 Testicular hypofunction: Secondary | ICD-10-CM

## 2019-09-06 DIAGNOSIS — J449 Chronic obstructive pulmonary disease, unspecified: Secondary | ICD-10-CM

## 2019-09-06 DIAGNOSIS — G4700 Insomnia, unspecified: Secondary | ICD-10-CM

## 2019-09-06 DIAGNOSIS — I10 Essential (primary) hypertension: Secondary | ICD-10-CM

## 2019-09-06 DIAGNOSIS — E113299 Type 2 diabetes mellitus with mild nonproliferative diabetic retinopathy without macular edema, unspecified eye: Secondary | ICD-10-CM

## 2019-09-06 NOTE — Chronic Care Management (AMB) (Signed)
Chronic Care Management Pharmacy  Name: Aaron Mitchell  MRN: [id number] DOB: 1946/02/06  Chief Complaint/ HPI  Aaron Mitchell,  74 y.o., male presents for their Initial CCM visit with the clinical pharmacist via telephone.  PCP : Aaron Bush, MD   Specialists:  Aaron Mitchell, Ophthalmology  Their chronic conditions include: HTN, HLD, hypogonadism, allergic rhinitis, OSA, COPD, diabetic retinopathy, gout, OA, preDM, chronic back/shoulder pain, insomnia  Allergies:  Codeine (headaches)  ACE/ARB (cough)  Patient concerns: no concerns, doing well   Office Visits:  07/27/19: Aaron Mitchell AWV - updated labs, pre-DM and sugars increasing, increase water, no medication changes  07/12/19: Aaron Mitchell - Hip pain, given depo-medrol 80 mg   Medications: Outpatient Encounter Medications as of 09/06/2019  Medication Sig  . albuterol (PROVENTIL) (2.5 MG/3ML) 0.083% nebulizer solution Take 3 mLs (2.5 mg total) by nebulization every 6 (six) hours as needed for wheezing or shortness of breath.  [name] Kitchen albuterol (VENTOLIN HFA) 108 (90 Base) MCG/ACT inhaler Inhale 2 puffs into the lungs every 6 (six) hours as needed for wheezing or shortness of breath.  . allopurinol (ZYLOPRIM) 300 MG tablet TAKE 1 TABLET BY MOUTH DAILY  . amLODipine (NORVASC) 5 MG tablet TAKE 1 TABLET(5 MG) BY MOUTH TWICE DAILY  . atorvastatin (LIPITOR) 40 MG tablet TAKE 1 TABLET(40 MG) BY MOUTH DAILY  . diclofenac (VOLTAREN) 75 MG EC tablet TAKE 1 TABLET BY MOUTH TWICE DAILY  . diclofenac sodium (VOLTAREN) 1 % GEL Apply 2 g topically daily as needed. Apply to both hands.  . fluticasone (FLONASE) 50 MCG/ACT nasal spray SHAKE LIQUID AND USE 2 SPRAYS IN EACH NOSTRIL DAILY  . metaxalone (SKELAXIN) 800 MG tablet Take 1 tablet (800 mg total) by mouth 3 (three) times daily as needed for muscle spasms.  . Multiple Vitamins tablet Take 1 tablet by mouth daily.  . naproxen sodium (ANAPROX) 220 MG tablet Take 440 mg by mouth daily.    . sildenafil (VIAGRA) 100 MG tablet Take by mouth.  . tadalafil (CIALIS) 20 MG tablet Take 20 mg by mouth daily as needed for erectile dysfunction.  [name] Kitchen testosterone cypionate (DEPOTESTOSTERONE CYPIONATE) 200 MG/ML injection Inject 0.5 mLs (100 mg total) into the muscle once a week.  . tiotropium (SPIRIVA HANDIHALER) 18 MCG inhalation capsule Place 1 capsule (18 mcg total) into inhaler and inhale daily.  . traMADol (ULTRAM) 50 MG tablet TAKE 1 TABLET BY MOUTH EVERY 8 HOURS AS NEEDED FOR MODERATE PAIN  . triamterene-hydrochlorothiazide (MAXZIDE-25) 37.5-25 MG tablet Take 1 tablet by mouth daily.  [name] Kitchen zolpidem (AMBIEN) 10 MG tablet TAKE 1 TABLET(10 MG) BY MOUTH AT BEDTIME AS NEEDED FOR SLEEP   No facility-administered encounter medications on file as of 09/06/2019.    Current Diagnosis/Assessment: Goals    . Patient Stated     Starting 07/05/2018, I will continue to take medications as prescribed.     . Patient Stated     07/25/2019, I will maintain and continue medications as prescribed.     . Pharmacy Care Plan     .Current Barriers:  . Chronic Disease Management support, education, and care coordination needs related to hypertension, hyperlipidemia, hypogonadism, allergic rhinitis, COPD, diabetic retinopathy, gout, osteoarthritis, pre-diabetes, chronic back/shoulder pain, insomnia  Pharmacist Clinical Goal(s):  [name] Kitchen Pharmacist will review medication safety every 6 months . Maintain blood pressure within goal of < 140/90 mmHg . Maintain A1c within goal of < 7% . Increase exercise slowly with goal of 150 minutes of moderate activity (such as brisk walking) each week  Interventions: . Comprehensive medication review performed. . Coordinate adherence packaging and medication delivery . Reduce amlodipine prescription from twice daily to once daily (patient is already taking this way)  Patient Self Care Activities:  . Self-administers medications as prescribed . Self monitors vital signs,  including blood pressure, heart rate, and pulse oximetry  Initial goal documentation          COPD   Last spirometry score: (03/30/17) post FEV1: 37% Gold Grade: Gold 3 (FEV1 30-49%) Current COPD Classification: low symptom burden, no reported exacerbations leading to hospitalization  Eosinophil count:   Lab Results  Component Value Date/Time   EOSPCT 2.5 06/01/2019 09:47 AM  %                               Eos (Absolute):  Lab Results  Component Value Date/Time   EOSABS 0.2 06/01/2019 09:47 AM   EOSABS 0.3 02/18/2018 08:28 AM   Tobacco Status:  Social History   Tobacco Use  Smoking Status Former Smoker  . Quit date: 07/27/1978  . Years since quitting: 41.1  Smokeless Tobacco Never Used  Tobacco Comment   Quit in 1980   Symptoms: reports Spiriva prevents SOB and has much improved his chest pain Patient has failed these meds in past: none  Pulse oximetry: (09/06/19 Self-checked while on call) 94% (usually 97-98%) Patient is currently controlled on the following medications:   Albuterol 0.083% nebulizer solution - 3 mLs every 6 hours as needed  Albuterol 90 mcg - 2 puffs every 6 hours as needed  Spiriva 18 mcg - 1 capsule into inhaler once daily Using maintenance inhaler regularly? Yes Frequency of rescue inhaler use:  1-2x a month  We discussed: Spiriva costs $100 every 90 days. We reviewed less expensive alternatives, but Spiriva is preferred. Patient is willing to pay current price due to benefits.  Plan: Continue current medications ,  Pre-Diabetes  Recent Relevant Labs: Lab Results  Component Value Date/Time   HGBA1C 6.4 06/01/2019 09:47 AM   HGBA1C 6.3 07/05/2018 10:07 AM   MICROALBUR 12.0 (H) 07/27/2019 08:46 AM    Checking BG: Never Patient is currently controlled on the following medications:   No pharmacotherapy  Last diabetic eye exam:  Lab Results  Component Value Date/Time   HMDIABEYEEXA Retinopathy (A) 01/09/2019 12:00 AM   We discussed:  Patient reports gaining back weight loss of 40 lbs. He is aware A1c is slowly increasing. Not ready for dietary changes at this time.  Plan: Continue control with diet and exercise.   Hypertension  Office blood pressures are: BP Readings from Last 3 Encounters:  07/27/19 140/78  07/12/19 140/80  06/01/19 122/66   BMP Latest Ref Rng & Units 07/27/2019 06/01/2019 07/05/2018  Glucose 70 - 99 mg/dL 117(H) 208(H) 122(H)  BUN 6 - 23 mg/dL 18 19 17   Creatinine 0.40 - 1.50 mg/dL 1.18 1.35 1.11  Sodium 135 - 145 mEq/L 138 138 140  Potassium 3.5 - 5.1 mEq/L 3.9 3.9 3.8  Chloride 96 - 112 mEq/L 99 98 102  CO2 19 - 32 mEq/L 31 31 31   Calcium 8.4 - 10.5 mg/dL 9.2 9.0 9.1   Patient has failed these meds in the past: lisinopril, losartan (cough with both) Patient checks BP at home several times per month Patient home BP readings are ranging:125-130/70-80s; HR: 74-75 Patient is currently controlled on the following medications:  Amlodipine 5 mg - 1 tablet twice daily  Triamterene-HCTZ  37.5-25 mg- 1 tablet daily We discussed: patient rarely takes second dose of amlodipine daily; just realized it was twice daily not too long ago, but as its his only evening medication, difficult to incorporate into schedule; BP is well controlled on 1 tablet daily  Plan: Continue triamterene-HCTZ at current dose. Discuss with Dr. Darnell Level reducing amlodipine 5 mg to once daily.   Hyperlipidemia  Lipid Panel     Component Value Date/Time   CHOL 111 07/27/2019 0846   TRIG 133.0 07/27/2019 0846   HDL 47.20 07/27/2019 0846   CHOLHDL 2 07/27/2019 0846   VLDL 26.6 07/27/2019 0846   LDLCALC 37 07/27/2019 0846   LDLDIRECT 108.0 09/14/2016 1637   No personal history of ASCVD LDL goal < 100 Reports tolerating well, no side effects Patient has failed these meds in past: none Patient is currently controlled on the following medications:  Atorvastatin 40 mg - 1 tablet daily  Plan: Continue current medications  Gout   Uric acid (07/05/18) 5.5 Location: left great toe Last gout flare: none in years, flares usually triggered by diet Patient has failed these meds in past: none Patient is currently controlled on the following medications:   Allopurinol 300 mg - 1 tablet daily We discussed:  Confirms daily adherence  Plan: Continue current medications   Osteoarthritis  Patient has failed these meds in past: none reported Location: back, hands, and hips  Patient is currently controlled on the following medications:  Naproxen 220 mg - 1 tablet daily Uses as needed (sparingly):  Tramadol 50 mg - 1 tablet every 8 hours as needed  Diclofenac sodium 1% gel - apply 2 g daily as needed to both hands   Diclofenac gel 75 mg - 1 tablet twice daily   Metaxalone 800 mg - 1 tablet three times daily as needed We discussed: Patient takes Aleve daily and uses all other medication a couple times a month for flare ups. We reviewed risks of taking diclofenac and Aleve together regularly.  Plan: Continue current medications  Allergies  Patient has failed these meds in past: none Patient is currently controlled on the following medications:   Flonase 50 mcg - 2 sprays in each nostril daily We discussed: takes daily, denies allergy symptoms   Plan: Continue current medications   Hypogonadism/Erectile Dysfunction  Denies any complaints/symptoms  Patient has failed these meds in past: none Patient is currently controlled on the following medications:   Sildenafil 100 mg - 1 tablet as needed  Tadalafil 20 mg - 1 tablet as needed  Testosterone cypionate 200 mg/mL- 100 mg once weekly self-injection We discussed: patient doesn't take sildenafil and tadalafil together, likes to have 2 different kinds to prevent tachyphylaxis; aware of drug interactions and appropriate use  Plan: Continue current medications   Insomnia  Patient has failed these meds in past: none Patient is currently controlled on the  following medications:   Zolpidem 10 mg - 1 at bedtime as needed  We discussed:  Patient only takes Ambien 2-3 days a month   Plan: Continue current medications  Medication Management:  OTCs: daily multivitamin   Adherence: takes medication directly from bottles (no pillbox), takes everything in the morning, generally forgets his night-time meds (only amlodipine); keeps leftover PRN medications in freezer, discussed medication not being stored in freezer due to potential loss of efficacy, but patient prefers to do this and has not had any issues  Cost: Spiriva $100 90 DS, most everything else is $10 for 90 DS  Pharmacy:  [facility]   Vaccines: Influenza, PCV13, PPSV23, Tdap, Zoster, Singrix -all up to date  Verbal consent obtained for [facility] enhanced pharmacy services (medication synchronization, adherence packaging, delivery coordination). A medication sync plan was created to allow patient to get all medications delivered once every 30 to 90 days per patient preference. Patient understands they have freedom to choose pharmacy and clinical pharmacist will coordinate care between all prescribers and [facility].  CCM Follow Up: Monday, February 26, 2020 at 3:30 PM (telephone)  Debbora Dus, PharmD Clinical Pharmacist [facility] at [facility] [phone]

## 2019-09-06 NOTE — Patient Instructions (Addendum)
Dear Aaron Mitchell,  It was a pleasure meeting you during our initial appointment on September 06, 2019. Below is a summary of the goals we discussed and components of chronic care management. Please contact me anytime with questions or concerns. I've attached dietary information for diabetes control for your information.  Visit Information Goals    . Patient Stated     Starting 07/05/2018, I will continue to take medications as prescribed.     . Patient Stated     07/25/2019, I will maintain and continue medications as prescribed.     . Pharmacy Care Plan     .Current Barriers:  . Chronic Disease Management support, education, and care coordination needs related to hypertension, hyperlipidemia, hypogonadism, allergic rhinitis, COPD, diabetic retinopathy, gout, osteoarthritis, pre-diabetes, chronic back/shoulder pain, insomnia  Pharmacist Clinical Goal(s):  [name] Kitchen Pharmacist will review medication safety every 6 months . Maintain blood pressure within goal of < 140/90 mmHg . Maintain A1c within goal of < 7% . Increase exercise slowly with goal of 150 minutes of moderate activity (such as brisk walking) each week  Interventions: . Comprehensive medication review performed. . Coordinate adherence packaging and medication delivery . Reduce amlodipine prescription from twice daily to once daily (patient is already taking this way)  Patient Self Care Activities:  . Self-administers medications as prescribed . Self monitors vital signs, including blood pressure, heart rate, and pulse oximetry  Initial goal documentation         Aaron Mitchell was given information about Chronic Care Management services today including:  1. CCM service includes personalized support from designated clinical staff supervised by his physician, including individualized plan of care and coordination with other care providers 2. 24/7 contact phone numbers for assistance for urgent and routine care  needs. 3. Service will only be billed when office clinical staff spend 20 minutes or more in a month to coordinate care. 4. Only one practitioner may furnish and bill the service in a calendar month. 5. The patient may stop CCM services at any time (effective at the end of the month) by phone call to the office staff. 6. The patient will be responsible for cost sharing (co-pay) of up to 20% of the service fee (after annual deductible is met).  Patient agreed to services and verbal consent obtained.   Telephone follow up appointment with pharmacy team member scheduled for: Monday, February 26, 2020 at 3:30 PM Please contact Sharyn Lull if this appointment needs to be rescheduled or if you would prefer a face-to-face appointment.  Debbora Dus, PharmD Clinical Pharmacist [facility] at [facility] [phone]   Diabetes Mellitus and Nutrition, Adult When you have diabetes (diabetes mellitus), it is very important to have healthy eating habits because your blood sugar (glucose) levels are greatly affected by what you eat and drink. Eating healthy foods in the appropriate amounts, at about the same times every day, can help you:  Control your blood glucose.  Lower your risk of heart disease.  Improve your blood pressure.  Reach or maintain a healthy weight. Every person with diabetes is different, and each person has different needs for a meal plan. Your health care provider may recommend that you work with a diet and nutrition specialist (dietitian) to make a meal plan that is best for you. Your meal plan may vary depending on factors such as:  The calories you need.  The medicines you take.  Your weight.  Your blood glucose, blood pressure, and cholesterol levels.  Your  activity level.  Other health conditions you have, such as heart or kidney disease. How do carbohydrates affect me? Carbohydrates, also called carbs, affect your blood glucose level more than any other  type of food. Eating carbs naturally raises the amount of glucose in your blood. Carb counting is a method for keeping track of how many carbs you eat. Counting carbs is important to keep your blood glucose at a healthy level, especially if you use insulin or take certain oral diabetes medicines. It is important to know how many carbs you can safely have in each meal. This is different for every person. Your dietitian can help you calculate how many carbs you should have at each meal and for each snack. Foods that contain carbs include:  Bread, cereal, rice, pasta, and crackers.  Potatoes and corn.  Peas, beans, and lentils.  Milk and yogurt.  Fruit and juice.  Desserts, such as cakes, cookies, ice cream, and candy. How does alcohol affect me? Alcohol can cause a sudden decrease in blood glucose (hypoglycemia), especially if you use insulin or take certain oral diabetes medicines. Hypoglycemia can be a life-threatening condition. Symptoms of hypoglycemia (sleepiness, dizziness, and confusion) are similar to symptoms of having too much alcohol. If your health care provider says that alcohol is safe for you, follow these guidelines:  Limit alcohol intake to no more than 1 drink per day for nonpregnant women and 2 drinks per day for men. One drink equals 12 oz of beer, 5 oz of wine, or 1 oz of hard liquor.  Do not drink on an empty stomach.  Keep yourself hydrated with water, diet soda, or unsweetened iced tea.  Keep in mind that regular soda, juice, and other mixers may contain a lot of sugar and must be counted as carbs. What are tips for following this plan?  Reading food labels  Start by checking the serving size on the "Nutrition Facts" label of packaged foods and drinks. The amount of calories, carbs, fats, and other nutrients listed on the label is based on one serving of the item. Many items contain more than one serving per package.  Check the total grams (g) of carbs in one  serving. You can calculate the number of servings of carbs in one serving by dividing the total carbs by 15. For example, if a food has 30 g of total carbs, it would be equal to 2 servings of carbs.  Check the number of grams (g) of saturated and trans fats in one serving. Choose foods that have low or no amount of these fats.  Check the number of milligrams (mg) of salt (sodium) in one serving. Most people should limit total sodium intake to less than 2,300 mg per day.  Always check the nutrition information of foods labeled as "low-fat" or "nonfat". These foods may be higher in added sugar or refined carbs and should be avoided.  Talk to your dietitian to identify your daily goals for nutrients listed on the label. Shopping  Avoid buying canned, premade, or processed foods. These foods tend to be high in fat, sodium, and added sugar.  Shop around the outside edge of the grocery store. This includes fresh fruits and vegetables, bulk grains, fresh meats, and fresh dairy. Cooking  Use low-heat cooking methods, such as baking, instead of high-heat cooking methods like deep frying.  Cook using healthy oils, such as olive, canola, or sunflower oil.  Avoid cooking with butter, cream, or high-fat meats. Meal planning  Eat meals and snacks regularly, preferably at the same times every day. Avoid going long periods of time without eating.  Eat foods high in fiber, such as fresh fruits, vegetables, beans, and whole grains. Talk to your dietitian about how many servings of carbs you can eat at each meal.  Eat 4-6 ounces (oz) of lean protein each day, such as lean meat, chicken, fish, eggs, or tofu. One oz of lean protein is equal to: ? 1 oz of meat, chicken, or fish. ? 1 egg. ?  cup of tofu.  Eat some foods each day that contain healthy fats, such as avocado, nuts, seeds, and fish. Lifestyle  Check your blood glucose regularly.  Exercise regularly as told by your health care provider.  This may include: ? 150 minutes of moderate-intensity or vigorous-intensity exercise each week. This could be brisk walking, biking, or water aerobics. ? Stretching and doing strength exercises, such as yoga or weightlifting, at least 2 times a week.  Take medicines as told by your health care provider.  Do not use any products that contain nicotine or tobacco, such as cigarettes and e-cigarettes. If you need help quitting, ask your health care provider.  Work with a Social worker or diabetes educator to identify strategies to manage stress and any emotional and social challenges. Questions to ask a health care provider  Do I need to meet with a diabetes educator?  Do I need to meet with a dietitian?  What number can I call if I have questions?  When are the best times to check my blood glucose? Where to find more information:  American Diabetes Association: diabetes.org  Academy of Nutrition and Dietetics: www.eatright.[employer] of Diabetes and Digestive and Kidney Diseases (NIH): [employer] Summary  A healthy meal plan will help you control your blood glucose and maintain a healthy lifestyle.  Working with a diet and nutrition specialist (dietitian) can help you make a meal plan that is best for you.  Keep in mind that carbohydrates (carbs) and alcohol have immediate effects on your blood glucose levels. It is important to count carbs and to use alcohol carefully. This information is not intended to replace advice given to you by your health care provider. Make sure you discuss any questions you have with your health care provider. Document Revised: 06/25/2017 Document Reviewed: 08/17/2016 Elsevier Patient Education  2020 [employer].

## 2019-09-11 ENCOUNTER — Telehealth: Payer: Self-pay

## 2019-09-11 ENCOUNTER — Ambulatory Visit: Payer: Self-pay | Admitting: Urology

## 2019-09-11 DIAGNOSIS — I10 Essential (primary) hypertension: Secondary | ICD-10-CM

## 2019-09-11 DIAGNOSIS — J449 Chronic obstructive pulmonary disease, unspecified: Secondary | ICD-10-CM

## 2019-09-11 NOTE — Telephone Encounter (Signed)
I would like to request a referral for Aaron Mitchell to chronic care management pharmacy services focusing on the following conditions:   Essential hypertension, benign  [I10]  Stage 3 severe COPD by GOLD classification ([place]) [J44.9]  Debbora Dus, PharmD Clinical Pharmacist [facility] at [facility] [phone]

## 2019-09-14 ENCOUNTER — Telehealth: Payer: Self-pay

## 2019-09-14 NOTE — Telephone Encounter (Signed)
Patient is transferring his preferred pharmacy from [facility] to [facility]. He is requesting the following medication refills: atorvastatin, triamterene/hydrochlorothiazide, and Spiriva  Please send refills to [facility] in [place].  Thank you,  Debbora Dus, PharmD Clinical Pharmacist [facility] at [facility] [phone]

## 2019-09-15 MED ORDER — SPIRIVA HANDIHALER 18 MCG IN CAPS: 18.0000 ug | ORAL_CAPSULE | Freq: Every day | RESPIRATORY_TRACT | 3 refills | Status: DC

## 2019-09-15 MED ORDER — TRIAMTERENE-HCTZ 37.5-25 MG PO TABS: 1.0000 | ORAL_TABLET | Freq: Every day | ORAL | 6 refills | Status: DC

## 2019-09-15 MED ORDER — ATORVASTATIN CALCIUM 40 MG PO TABS: ORAL_TABLET | ORAL | 3 refills | Status: DC

## 2019-09-15 NOTE — Telephone Encounter (Signed)
Noted.  E-scribed refills.  

## 2019-09-18 ENCOUNTER — Telehealth: Payer: Self-pay

## 2019-09-18 DIAGNOSIS — E291 Testicular hypofunction: Secondary | ICD-10-CM

## 2019-09-18 NOTE — Telephone Encounter (Signed)
Pt is requesting refill of testosterone, it appears he is due for an appointment are you managing him for this or does he need to see another provider? Thanks

## 2019-09-18 NOTE — Telephone Encounter (Signed)
Appt made.  Patient aware  

## 2019-09-18 NOTE — Telephone Encounter (Signed)
Offer him follow up with Larene Beach for T replacement and monitoring, I can see him if he refuses to see her, thanks  Nickolas Madrid, MD 09/18/2019

## 2019-09-18 NOTE — Telephone Encounter (Signed)
Pt is due for labs and 67mos visit, per [facility] he needs to see [facility]. Please schedule. Labs ordered. Thanks

## 2019-09-20 ENCOUNTER — Other Ambulatory Visit: Payer: PPO

## 2019-09-20 ENCOUNTER — Other Ambulatory Visit: Payer: Self-pay

## 2019-09-20 DIAGNOSIS — E291 Testicular hypofunction: Secondary | ICD-10-CM | POA: Diagnosis not present

## 2019-09-21 LAB — TESTOSTERONE: Testosterone: 881 ng/dL (ref 264–916)

## 2019-09-21 LAB — HEMOGLOBIN AND HEMATOCRIT, BLOOD
Hematocrit: 51.6 % — ABNORMAL HIGH (ref 37.5–51.0)
Hemoglobin: 17.9 g/dL — ABNORMAL HIGH (ref 13.0–17.7)

## 2019-09-21 LAB — PSA: Prostate Specific Ag, Serum: 1 ng/mL (ref 0.0–4.0)

## 2019-09-22 ENCOUNTER — Ambulatory Visit: Payer: PPO | Admitting: Urology

## 2019-09-27 ENCOUNTER — Ambulatory Visit (INDEPENDENT_AMBULATORY_CARE_PROVIDER_SITE_OTHER): Payer: PPO | Admitting: Family Medicine

## 2019-09-27 ENCOUNTER — Other Ambulatory Visit: Payer: Self-pay

## 2019-09-27 ENCOUNTER — Encounter: Payer: Self-pay | Admitting: Family Medicine

## 2019-09-27 VITALS — BP 158/90 | HR 69 | Temp 98.4°F | Ht 69.0 in | Wt 282.5 lb

## 2019-09-27 DIAGNOSIS — M19011 Primary osteoarthritis, right shoulder: Secondary | ICD-10-CM

## 2019-09-27 DIAGNOSIS — M19012 Primary osteoarthritis, left shoulder: Secondary | ICD-10-CM

## 2019-09-27 DIAGNOSIS — G8929 Other chronic pain: Secondary | ICD-10-CM | POA: Diagnosis not present

## 2019-09-27 MED ORDER — METHYLPREDNISOLONE ACETATE 40 MG/ML IJ SUSP: 80.0000 mg | Freq: Once | INTRAMUSCULAR | Status: DC

## 2019-09-27 MED ORDER — METHYLPREDNISOLONE ACETATE 80 MG/ML IJ SUSP
80.0000 mg | Freq: Once | INTRAMUSCULAR | Status: AC
  Administered 2019-09-27: 80 mg via INTRAMUSCULAR

## 2019-09-27 MED ORDER — METHYLPREDNISOLONE ACETATE 40 MG/ML IJ SUSP
80.0000 mg | Freq: Once | INTRAMUSCULAR | Status: AC
  Administered 2019-09-27: 80 mg via INTRA_ARTICULAR

## 2019-09-27 NOTE — Progress Notes (Signed)
Shamaine Mulkern T. Arthur Aydelotte, MD Primary Care and [place] [facility] [place], [address] Phone: [phone]  FAX: [phone]  Aaron Mitchell - 74 y.o. male  MRN [id number]  Date of Birth: Sep 07, 1945  Visit Date: 09/27/2019  PCP: [name], MD  Referred by: [name], MD  Chief Complaint  Patient presents with  . Shoulder Pain    Bilateral injections    This visit occurred during the SARS-CoV-2 public health emergency.  Safety protocols were in place, including screening questions prior to the visit, additional usage of staff PPE, and extensive cleaning of exam room while observing appropriate contact time as indicated for disinfecting solutions.   Subjective:   Aaron Mitchell is a 74 y.o. very pleasant male patient who presents with the following:  R shoulder injection.  He is a patient who has chronic pain in multiple joints.  Previous I have injected a number of his joints.  Today primarily is having right shoulder pain and to lesser extent left shoulder pain.  On exam his right shoulder is symptomatic, but his left shoulder is minimally symptomatic on exam, so I elected to do a right sided intra-articular injection only.  Given his multiple joint pain and pain complaints chronically, I decided to give him a intramuscular injection as well.  Review of Systems is noted in the HPI, as appropriate  Objective:   BP (!) 158/90   Pulse 69   Temp 98.4 F (36.9 C) (Temporal)   Ht 5\' 9"  (1.753 m)   Wt 282 lb 8 oz (128.1 kg)   SpO2 97%   BMI 41.72 kg/m   GEN: No acute distress; alert,appropriate. PULM: Breathing comfortably in no respiratory distress PSYCH: Normally interactive.    Shoulder: R Inspection: No muscle wasting or winging Ecchymosis/edema: neg  AC joint, scapula, clavicle: NT Cervical spine: NT, full ROM Spurling's: neg Abduction: full, 5/5 Flexion: full, 5/5 IR, full,  lift-off: 5/5 ER at neutral: full, 5/5 AC crossover: neg Neer: pos Hawkins: pos Drop Test: neg Empty Can: pos Supraspinatus insertion: mild-mod T Bicipital groove: NT Speed's: neg Yergason's: neg Sulcus sign: neg Scapular dyskinesis: none C5-T1 intact  Neuro: Sensation intact Grip 5/5   The left shoulder is asymptomatic on exam.  Laboratory and Imaging Data:  Assessment and Plan:     ICD-10-CM   1. Primary osteoarthritis of both shoulders  M19.011 methylPREDNISolone acetate (DEPO-MEDROL) injection 80 mg   M19.012 methylPREDNISolone acetate (DEPO-MEDROL) injection 80 mg    methylPREDNISolone acetate (DEPO-MEDROL) injection 80 mg  2. Other chronic pain  G89.29    Given polyarthralgia, the patient was also given 80 mg of Depo-Medrol IM.  To correct above, the patient was only given Depo-Medrol 80 mg in the right shoulder intra-articularly, and also gave him an additional dose intramuscularly.  Intraarticular Shoulder Aspiration/Injection Procedure Note Aaron Mitchell 09-01-1945 Date of procedure: 09/27/2019  Procedure: Large Joint Aspiration / Injection of Shoulder, Intraarticular, R Indications: Pain  Procedure Details Verbal consent was obtained from the patient. Risks including infection explained and contrasted with benefits and alternatives. Patient prepped with Chloraprep and Ethyl Chloride used for anesthesia. An intraarticular shoulder injection was performed using the posterior approach; needle placed into joint capsule without difficulty. The patient tolerated the procedure well and had decreased pain post injection. No complications. Injection: 8 cc of Lidocaine 1% and 2 mL Depo-Medrol 40 mg. Needle: 21 gauge, 2 inch   Follow-up: No follow-ups on file.  Meds ordered this encounter  Medications  . methylPREDNISolone acetate (DEPO-MEDROL) injection 80 mg  . methylPREDNISolone acetate (DEPO-MEDROL) injection 80 mg  . methylPREDNISolone acetate (DEPO-MEDROL)  injection 80 mg   Medications Discontinued During This Encounter  Medication Reason  . amLODipine (NORVASC) 5 MG tablet Duplicate   No orders of the defined types were placed in this encounter.   Signed,  Maud Deed. Shakenna Herrero, MD   Outpatient Encounter Medications as of 09/27/2019  Medication Sig  . albuterol (PROVENTIL) (2.5 MG/3ML) 0.083% nebulizer solution Take 3 mLs (2.5 mg total) by nebulization every 6 (six) hours as needed for wheezing or shortness of breath.  [name] Kitchen albuterol (VENTOLIN HFA) 108 (90 Base) MCG/ACT inhaler Inhale 2 puffs into the lungs every 6 (six) hours as needed for wheezing or shortness of breath.  . allopurinol (ZYLOPRIM) 300 MG tablet TAKE 1 TABLET BY MOUTH DAILY  . amLODipine (NORVASC) 5 MG tablet Take 5 mg by mouth daily.  [name] Kitchen atorvastatin (LIPITOR) 40 MG tablet TAKE 1 TABLET(40 MG) BY MOUTH DAILY  . diclofenac (VOLTAREN) 75 MG EC tablet TAKE 1 TABLET BY MOUTH TWICE DAILY  . diclofenac sodium (VOLTAREN) 1 % GEL Apply 2 g topically daily as needed. Apply to both hands.  . fluticasone (FLONASE) 50 MCG/ACT nasal spray SHAKE LIQUID AND USE 2 SPRAYS IN EACH NOSTRIL DAILY  . metaxalone (SKELAXIN) 800 MG tablet Take 1 tablet (800 mg total) by mouth 3 (three) times daily as needed for muscle spasms.  . Multiple Vitamins tablet Take 1 tablet by mouth daily.  . naproxen sodium (ANAPROX) 220 MG tablet Take 220 mg by mouth daily.   . sildenafil (VIAGRA) 100 MG tablet Take by mouth.  . tadalafil (CIALIS) 20 MG tablet Take 20 mg by mouth daily as needed for erectile dysfunction.  [name] Kitchen testosterone cypionate (DEPOTESTOSTERONE CYPIONATE) 200 MG/ML injection Inject 0.5 mLs (100 mg total) into the muscle once a week.  . tiotropium (SPIRIVA HANDIHALER) 18 MCG inhalation capsule Place 1 capsule (18 mcg total) into inhaler and inhale daily.  . traMADol (ULTRAM) 50 MG tablet TAKE 1 TABLET BY MOUTH EVERY 8 HOURS AS NEEDED FOR MODERATE PAIN  . triamterene-hydrochlorothiazide (MAXZIDE-25)  37.5-25 MG tablet Take 1 tablet by mouth daily.  [name] Kitchen zolpidem (AMBIEN) 10 MG tablet TAKE 1 TABLET(10 MG) BY MOUTH AT BEDTIME AS NEEDED FOR SLEEP  . [DISCONTINUED] amLODipine (NORVASC) 5 MG tablet TAKE 1 TABLET(5 MG) BY MOUTH TWICE DAILY   Facility-Administered Encounter Medications as of 09/27/2019  Medication  . methylPREDNISolone acetate (DEPO-MEDROL) injection 80 mg  . [COMPLETED] methylPREDNISolone acetate (DEPO-MEDROL) injection 80 mg  . [COMPLETED] methylPREDNISolone acetate (DEPO-MEDROL) injection 80 mg

## 2019-09-29 ENCOUNTER — Encounter: Payer: Self-pay | Admitting: Urology

## 2019-09-29 ENCOUNTER — Other Ambulatory Visit: Payer: Self-pay

## 2019-09-29 ENCOUNTER — Ambulatory Visit: Payer: PPO | Admitting: Urology

## 2019-09-29 VITALS — Ht 69.0 in

## 2019-09-29 DIAGNOSIS — R7989 Other specified abnormal findings of blood chemistry: Secondary | ICD-10-CM | POA: Diagnosis not present

## 2019-09-29 MED ORDER — TESTOSTERONE CYPIONATE 200 MG/ML IM SOLN: 100.0000 mg | INTRAMUSCULAR | 2 refills | Status: DC

## 2019-09-29 NOTE — Progress Notes (Signed)
09/29/2019 8:42 AM   Aaron Mitchell 02-Nov-1945 [id number]  Referring provider: [name], MD [place],  [place] [address]  No chief complaint on file. Low testosterone  HPI: Aaron Mitchell returns in management of his low testosterone. Aaron Mitchell returns with a history of hypogonadism who transferred care to [facility] January 2019 from Dr. [name]. He has been on testosterone replacement therapy for years.  He is on 0.5 mL (100 mg) once a week IM. His July 2019 PSA was 1.3, testosterone 740 and hematocrit 51.9.   He returns and recent PSA was 1, testosterone 881, hematocrit 51.6.  All of this looks stable.  His hematocrit was 54.4 back in 2017.   PMH: Past Medical History:  Diagnosis Date  . Achilles tendinitis    Left leg  . Borderline diabetes mellitus   . Childhood asthma   . Degenerative disc disease, lumbar    rec against surgery  . Erectile dysfunction   . GI bleed 12/2015   hospitalization  . Gout   . Hypertension   . Ileus ([facility]) 12/2015   hospitalization   . Male hypogonadism    followed by urology on T injections ([place])  . Seasonal allergies    pollen  . Severe obesity (BMI >= 40) ([facility])   . Shoulder bursitis   . Sleep apnea    CPAP    Surgical History: Past Surgical History:  Procedure Laterality Date  . ADENOIDECTOMY    . CARPAL TUNNEL RELEASE Right 2015  . CATARACT EXTRACTION W/PHACO Right 07/15/2016   Procedure: CATARACT EXTRACTION PHACO AND INTRAOCULAR LENS PLACEMENT (IOC);  Surgeon: Leandrew Koyanagi, MD;  Location: [facility];  Service: Ophthalmology;  Laterality: Right;  TORIC sleep apnea  . CATARACT EXTRACTION W/PHACO Left 08/05/2016   Procedure: CATARACT EXTRACTION PHACO AND INTRAOCULAR LENS PLACEMENT (IOC) toric;  Surgeon: Leandrew Koyanagi, MD;  Location: [facility];  Service: Ophthalmology;  Laterality: Left;  left sleep apnea toric  . COLONOSCOPY  10/2012   rpt 5 yrs ([facility])  . EYE SURGERY     Lens replacement 07/12/2016,08/05/2016  . LUMBAR EPIDURAL INJECTION Bilateral 04/2017, 12/2017   S1 transforaminal ESI ([place])  . RHINOPLASTY    . TONSILLECTOMY  ?1954  . TRIGGER FINGER RELEASE Right 2015   4th    Home Medications:  Allergies as of 09/29/2019      Reactions   Codeine Other (See Comments)   Other reaction(s): Other (See Comments) HEADACHES Causes headaches when he stops liquid Codeine-based products   Ace Inhibitors Cough   Cough also present with ARB (losartan)      Medication List       Accurate as of September 29, 2019  8:42 AM. If you have any questions, ask your nurse or doctor.        albuterol (2.5 MG/3ML) 0.083% nebulizer solution Commonly known as: PROVENTIL Take 3 mLs (2.5 mg total) by nebulization every 6 (six) hours as needed for wheezing or shortness of breath.   albuterol 108 (90 Base) MCG/ACT inhaler Commonly known as: VENTOLIN HFA Inhale 2 puffs into the lungs every 6 (six) hours as needed for wheezing or shortness of breath.   allopurinol 300 MG tablet Commonly known as: ZYLOPRIM TAKE 1 TABLET BY MOUTH DAILY   amLODipine 5 MG tablet Commonly known as: NORVASC Take 5 mg by mouth daily.   atorvastatin 40 MG tablet Commonly known as: LIPITOR TAKE 1 TABLET(40 MG) BY MOUTH DAILY   diclofenac 75 MG EC  tablet Commonly known as: VOLTAREN TAKE 1 TABLET BY MOUTH TWICE DAILY   diclofenac sodium 1 % Gel Commonly known as: VOLTAREN Apply 2 g topically daily as needed. Apply to both hands.   fluticasone 50 MCG/ACT nasal spray Commonly known as: FLONASE SHAKE LIQUID AND USE 2 SPRAYS IN EACH NOSTRIL DAILY   metaxalone 800 MG tablet Commonly known as: SKELAXIN Take 1 tablet (800 mg total) by mouth 3 (three) times daily as needed for muscle spasms.   Multiple Vitamins tablet Take 1 tablet by mouth daily.   naproxen sodium 220 MG tablet Commonly known as: ALEVE Take 220 mg by mouth daily.   Spiriva HandiHaler 18 MCG inhalation  capsule Generic drug: tiotropium Place 1 capsule (18 mcg total) into inhaler and inhale daily.   tadalafil 20 MG tablet Commonly known as: CIALIS Take 20 mg by mouth daily as needed for erectile dysfunction.   testosterone cypionate 200 MG/ML injection Commonly known as: DEPOTESTOSTERONE CYPIONATE Inject 0.5 mLs (100 mg total) into the muscle once a week.   traMADol 50 MG tablet Commonly known as: ULTRAM TAKE 1 TABLET BY MOUTH EVERY 8 HOURS AS NEEDED FOR MODERATE PAIN   triamterene-hydrochlorothiazide 37.5-25 MG tablet Commonly known as: MAXZIDE-25 Take 1 tablet by mouth daily.   Viagra 100 MG tablet Generic drug: sildenafil Take by mouth.   zolpidem 10 MG tablet Commonly known as: AMBIEN TAKE 1 TABLET(10 MG) BY MOUTH AT BEDTIME AS NEEDED FOR SLEEP       Allergies:  Allergies  Allergen Reactions  . Codeine Other (See Comments)    Other reaction(s): Other (See Comments) HEADACHES Causes headaches when he stops liquid Codeine-based products  . Ace Inhibitors Cough    Cough also present with ARB (losartan)    Family History: Family History  Problem Relation Age of Onset  . Alcohol abuse Father   . Hypertension Father   . AVM Father 87       hemorrhagic stroke  . Diabetes Paternal Grandmother   . Cancer Neg Hx   . CAD Neg Hx   . Prostate cancer Neg Hx   . Bladder Cancer Neg Hx   . Kidney cancer Neg Hx     Social History:  reports that he quit smoking about 41 years ago. He has never used smokeless tobacco. He reports current alcohol use of about 5.0 standard drinks of alcohol per week. He reports that he does not use drugs.   Physical Exam: There were no vitals taken for this visit.  Constitutional:  Alert and oriented, No acute distress. HEENT: [place] AT, moist mucus membranes.  Trachea midline, no masses. Cardiovascular: No clubbing, cyanosis, or edema. Respiratory: Normal respiratory effort, no increased work of breathing. GI: Abdomen is soft, nontender,  nondistended, no abdominal masses GU: No CVA tenderness Lymph: No cervical or inguinal lymphadenopathy. Skin: No rashes, bruises or suspicious lesions. Neurologic: Grossly intact, no focal deficits, moving all 4 extremities. Psychiatric: Normal mood and affect. DRE: prostate 30 grams, smooth, no hard area or nodule   Laboratory Data: Lab Results  Component Value Date   WBC 6.0 06/01/2019   HGB 17.9 (H) 09/20/2019   HCT 51.6 (H) 09/20/2019   MCV 90.3 06/01/2019   PLT 155.0 06/01/2019    Lab Results  Component Value Date   CREATININE 1.18 07/27/2019    No results found for: PSA  Lab Results  Component Value Date   TESTOSTERONE 881 09/20/2019    Lab Results  Component Value Date  HGBA1C 6.4 06/01/2019    Urinalysis    Component Value Date/Time   APPEARANCEUR Clear 08/12/2017 1102   GLUCOSEU Negative 08/12/2017 1102   BILIRUBINUR Negative 08/12/2017 1102   PROTEINUR Negative 08/12/2017 1102   NITRITE Negative 08/12/2017 1102   LEUKOCYTESUR Negative 08/12/2017 1102    No results found for: LABMICR, [place], RBCUA, LABEPIT, MUCUS, BACTERIA  Pertinent Imaging: n/a Results for orders placed during the hospital encounter of 12/28/15  DG Abd 1 View   Narrative CLINICAL DATA:  Nasogastric tube placement  EXAM: ABDOMEN - 1 VIEW  COMPARISON:  CT abdomen and pelvis December 28, 2015  FINDINGS: Nasogastric tube tip and side port are in the stomach. Stomach is distended with air. There are multiple loops of mildly dilated bowel. No free air evident. Lung bases are clear.  IMPRESSION: Nasogastric tube tip and side port in stomach. Stomach is distended with air. Loops of dilated small bowel persist. No free air. Lung bases clear.   Electronically Signed   By: Lowella Grip III M.D.   On: 12/28/2015 19:50    No results found for this or any previous visit. No results found for this or any previous visit. No results found for this or any previous visit. No  results found for this or any previous visit. No results found for this or any previous visit. No results found for this or any previous visit. No results found for this or any previous visit.  Assessment & Plan:    Low T - doing very well and stable on 100 mg Im q week. See in 1 year with labs prior.  No follow-ups on file.  Festus Aloe, MD  [facility] [address], [place], [place] [address] [phone]

## 2019-09-29 NOTE — Patient Instructions (Signed)
Testosterone injection What is this medicine? TESTOSTERONE (tes TOS ter one) is the main male hormone. It supports normal male development such as muscle growth, facial hair, and deep voice. It is used in males to treat low testosterone levels. This medicine may be used for other purposes; ask your health care provider or pharmacist if you have questions. COMMON BRAND NAME(S): Andro-L.A., Aveed, Delatestryl, Depo-Testosterone, Virilon What should I tell my health care provider before I take this medicine? They need to know if you have any of these conditions:  cancer  diabetes  heart disease  kidney disease  liver disease  lung disease  prostate disease  an unusual or allergic reaction to testosterone, other medicines, foods, dyes, or preservatives  pregnant or trying to get pregnant  breast-feeding How should I use this medicine? This medicine is for injection into a muscle. It is usually given by a health care professional in a hospital or clinic setting. Contact your pediatrician regarding the use of this medicine in children. While this medicine may be prescribed for children as young as 54 years of age for selected conditions, precautions do apply. Overdosage: If you think you have taken too much of this medicine contact a poison control center or emergency room at once. NOTE: This medicine is only for you. Do not share this medicine with others. What if I miss a dose? Try not to miss a dose. Your doctor or health care professional will tell you when your next injection is due. Notify the office if you are unable to keep an appointment. What may interact with this medicine?  medicines for diabetes  medicines that treat or prevent blood clots like warfarin  oxyphenbutazone  propranolol  steroid medicines like prednisone or cortisone This list may not describe all possible interactions. Give your health care provider a list of all the medicines, herbs, non-prescription  drugs, or dietary supplements you use. Also tell them if you smoke, drink alcohol, or use illegal drugs. Some items may interact with your medicine. What should I watch for while using this medicine? Visit your doctor or health care professional for regular checks on your progress. They will need to check the level of testosterone in your blood. This medicine is only approved for use in men who have low levels of testosterone related to certain medical conditions. Heart attacks and strokes have been reported with the use of this medicine. Notify your doctor or health care professional and seek emergency treatment if you develop breathing problems; changes in vision; confusion; chest pain or chest tightness; sudden arm pain; severe, sudden headache; trouble speaking or understanding; sudden numbness or weakness of the face, arm or leg; loss of balance or coordination. Talk to your doctor about the risks and benefits of this medicine. This medicine may affect blood sugar levels. If you have diabetes, check with your doctor or health care professional before you change your diet or the dose of your diabetic medicine. Testosterone injections are not commonly used in women. Women should inform their doctor if they wish to become pregnant or think they might be pregnant. There is a potential for serious side effects to an unborn child. Talk to your health care professional or pharmacist for more information. Talk with your doctor or health care professional about your birth control options while taking this medicine. This drug is banned from use in athletes by most athletic organizations. What side effects may I notice from receiving this medicine? Side effects that you should report to  your doctor or health care professional as soon as possible:  allergic reactions like skin rash, itching or hives, swelling of the face, lips, or tongue  breast enlargement  breathing problems  changes in emotions or  moods  deep or hoarse voice  irregular menstrual periods  signs and symptoms of liver injury like dark yellow or brown urine; general ill feeling or flu-like symptoms; light-colored stools; loss of appetite; nausea; right upper belly pain; unusually weak or tired; yellowing of the eyes or skin  stomach pain  swelling of the ankles, feet, hands  too frequent or persistent erections  trouble passing urine or change in the amount of urine Side effects that usually do not require medical attention (report to your doctor or health care professional if they continue or are bothersome):  acne  change in sex drive or performance  facial hair growth  hair loss  headache This list may not describe all possible side effects. Call your doctor for medical advice about side effects. You may report side effects to FDA at 1-800-FDA-1088. Where should I keep my medicine? Keep out of the reach of children. This medicine can be abused. Keep your medicine in a safe place to protect it from theft. Do not share this medicine with anyone. Selling or giving away this medicine is dangerous and against the law. Store at room temperature between 20 and 25 degrees C (68 and 77 degrees F). Do not freeze. Protect from light. Follow the directions for the product you are prescribed. Throw away any unused medicine after the expiration date. NOTE: This sheet is a summary. It may not cover all possible information. If you have questions about this medicine, talk to your doctor, pharmacist, or health care provider.  2020 Elsevier/Gold Standard (2015-08-17 07:33:55)  

## 2019-10-02 ENCOUNTER — Ambulatory Visit: Payer: PPO | Admitting: Family Medicine

## 2019-10-10 DIAGNOSIS — E113212 Type 2 diabetes mellitus with mild nonproliferative diabetic retinopathy with macular edema, left eye: Secondary | ICD-10-CM | POA: Diagnosis not present

## 2019-10-21 ENCOUNTER — Other Ambulatory Visit: Payer: Self-pay | Admitting: Family Medicine

## 2019-10-22 ENCOUNTER — Encounter: Payer: Self-pay | Admitting: Family Medicine

## 2019-10-23 NOTE — Telephone Encounter (Signed)
Both (pt & pt's wife) charts have been updated.

## 2019-10-25 ENCOUNTER — Ambulatory Visit: Payer: Self-pay

## 2019-10-25 DIAGNOSIS — R7303 Prediabetes: Secondary | ICD-10-CM

## 2019-10-25 DIAGNOSIS — I10 Essential (primary) hypertension: Secondary | ICD-10-CM

## 2019-10-25 NOTE — Chronic Care Management (AMB) (Signed)
Chronic Care Management Pharmacy  Name: Aaron Mitchell  MRN: [id number] DOB: March 30, 1946  Chief Complaint/ HPI  Aaron Mitchell,  74 y.o., male contacted for CCM follow up and pharmacy coordination services.   PCP : [name], MD   Their chronic conditions include: HTN, HLD, hypogonadism, allergic rhinitis, OSA, COPD, diabetic retinopathy, gout, OA, preDM, chronic back/shoulder pain, insomnia  Patient concerns: no concerns  Office Visits:   09/27/19: [name] - shoulder injection  Consult Visits:  09/29/19: [name] -  Low testosterone, doing very well and stable on 100 mg IM qweek. See in 1 year with labs prior.  Allergies  Allergen Reactions  . Codeine Other (See Comments)    Other reaction(s): Other (See Comments) HEADACHES Causes headaches when he stops liquid Codeine-based products  . Ace Inhibitors Cough    Cough also present with ARB (losartan)   Medications: Outpatient Encounter Medications as of 10/25/2019  Medication Sig  . albuterol (PROVENTIL) (2.5 MG/3ML) 0.083% nebulizer solution Take 3 mLs (2.5 mg total) by nebulization every 6 (six) hours as needed for wheezing or shortness of breath.  [name] Kitchen albuterol (VENTOLIN HFA) 108 (90 Base) MCG/ACT inhaler Inhale 2 puffs into the lungs every 6 (six) hours as needed for wheezing or shortness of breath.  . allopurinol (ZYLOPRIM) 300 MG tablet TAKE 1 TABLET BY MOUTH DAILY  . amLODipine (NORVASC) 5 MG tablet Take 5 mg by mouth daily.  [name] Kitchen atorvastatin (LIPITOR) 40 MG tablet TAKE 1 TABLET(40 MG) BY MOUTH DAILY  . diclofenac (VOLTAREN) 75 MG EC tablet TAKE 1 TABLET BY MOUTH TWICE DAILY  . diclofenac sodium (VOLTAREN) 1 % GEL Apply 2 g topically daily as needed. Apply to both hands.  . fluticasone (FLONASE) 50 MCG/ACT nasal spray SHAKE LIQUID AND USE 2 SPRAYS IN EACH NOSTRIL DAILY  . metaxalone (SKELAXIN) 800 MG tablet Take 1 tablet (800 mg total) by mouth 3 (three) times daily as needed for muscle spasms.  . Multiple Vitamins  tablet Take 1 tablet by mouth daily.  . naproxen sodium (ANAPROX) 220 MG tablet Take 220 mg by mouth daily.   . sildenafil (VIAGRA) 100 MG tablet Take by mouth.  . tadalafil (CIALIS) 20 MG tablet Take 20 mg by mouth daily as needed for erectile dysfunction.  [name] Kitchen testosterone cypionate (DEPOTESTOSTERONE CYPIONATE) 200 MG/ML injection Inject 0.5 mLs (100 mg total) into the muscle once a week.  . tiotropium (SPIRIVA HANDIHALER) 18 MCG inhalation capsule Place 1 capsule (18 mcg total) into inhaler and inhale daily.  . traMADol (ULTRAM) 50 MG tablet TAKE 1 TABLET BY MOUTH EVERY 8 HOURS AS NEEDED FOR MODERATE PAIN  . triamterene-hydrochlorothiazide (MAXZIDE-25) 37.5-25 MG tablet Take 1 tablet by mouth daily.  [name] Kitchen zolpidem (AMBIEN) 10 MG tablet TAKE 1 TABLET(10 MG) BY MOUTH AT BEDTIME AS NEEDED FOR SLEEP   Facility-Administered Encounter Medications as of 10/25/2019  Medication  . methylPREDNISolone acetate (DEPO-MEDROL) injection 80 mg    Current Diagnosis/Assessment: Goals    . Patient Stated     Starting 07/05/2018, I will continue to take medications as prescribed.     . Patient Stated     07/25/2019, I will maintain and continue medications as prescribed.     . Pharmacy Care Plan     .Current Barriers:  . Chronic Disease Management support, education, and care coordination needs related to hypertension, hyperlipidemia, hypogonadism, allergic rhinitis, COPD, diabetic retinopathy, gout, osteoarthritis, pre-diabetes, chronic back/shoulder pain, insomnia  Pharmacist Clinical Goal(s):  [name] Kitchen Pharmacist will review medication safety every 6 months .  Maintain blood pressure within goal of < 140/90 mmHg . Maintain A1c within goal of < 7% . Increase exercise slowly with goal of 150 minutes of moderate activity (such as brisk walking) each week  Interventions: . Comprehensive medication review performed. . Coordinate adherence packaging and medication delivery . Reduce amlodipine prescription from  twice daily to once daily (patient is already taking this way)  Patient Self Care Activities:  . Self-administers medications as prescribed . Self monitors vital signs, including blood pressure, heart rate, and pulse oximetry  Initial goal documentation        Hypertension  Office blood pressures are: BP Readings from Last 3 Encounters:  09/27/19 (!) 158/90  07/27/19 140/78  07/12/19 140/80   BMP Latest Ref Rng & Units 07/27/2019 06/01/2019 07/05/2018  Glucose 70 - 99 mg/dL 117(H) 208(H) 122(H)  BUN 6 - 23 mg/dL 18 19 17   Creatinine 0.40 - 1.50 mg/dL 1.18 1.35 1.11  Sodium 135 - 145 mEq/L 138 138 140  Potassium 3.5 - 5.1 mEq/L 3.9 3.9 3.8  Chloride 96 - 112 mEq/L 99 98 102  CO2 19 - 32 mEq/L 31 31 31   Calcium 8.4 - 10.5 mg/dL 9.2 9.0 9.1   Patient has failed these meds in the past: lisinopril, losartan (cough with both) Patient checks BP at home several times per month Patient home BP readings are ranging:125-130/70-80s; HR: 74-75 Patient is currently controlled on the following medications:  Amlodipine 5 mg - 1 tablet once daily  Triamterene-HCTZ 37.5-25 mg- 1 tablet daily  We discussed: at previous visit reported rarely taking second dose of amlodipine daily; BP well controlled on 1 tablet daily, had rx changed to once daily and doing well   Plan: Continue current medications.   Vaccines   Reviewed and discussed patient's vaccination history.    Immunization History  Administered Date(s) Administered  . Influenza, High Dose Seasonal PF 05/14/2017, 04/07/2018  . Influenza, Quadrivalent, Recombinant, Inj, Pf 03/20/2019, 06/12/2019  . Influenza-Unspecified 08/27/2016  . Pneumococcal Conjugate-13 08/23/2014, 03/20/2019  . Pneumococcal Polysaccharide-23 03/26/2016  . Td 08/23/2014  . Tdap 08/23/2014  . Zoster 08/08/2013  . Zoster Recombinat (Shingrix) 03/14/2018, 05/16/2018   Plan: All vaccines up to date  Medication Management: Coordinated med sync plan for  packaging and delivery services   OTCs: daily multivitamin   Adherence: using adherence packaging through [facility]  Cost: Spiriva $100 90 DS, most everything else is $10 for 90 DS, no concerns right now   Pharmacy: [facility] - adherence packaging and delivery  CCM Follow Up: Monday, February 26, 2020 at 3:30 PM (telephone)  Debbora Dus, PharmD Clinical Pharmacist [facility] at [facility] [phone]

## 2019-10-26 ENCOUNTER — Telehealth: Payer: Self-pay

## 2019-10-26 MED ORDER — AMLODIPINE BESYLATE 5 MG PO TABS: 5.0000 mg | ORAL_TABLET | Freq: Every day | ORAL | 3 refills | Status: DC

## 2019-10-26 NOTE — Telephone Encounter (Signed)
Requesting a refill on amlodipine 5 mg once daily. Please send to [facility].  Thanks!  Aaron Mitchell, PharmD Clinical Pharmacist [facility] at [facility] [phone]

## 2019-10-26 NOTE — Telephone Encounter (Signed)
E-scribed refill 

## 2019-11-15 DIAGNOSIS — G4733 Obstructive sleep apnea (adult) (pediatric): Secondary | ICD-10-CM | POA: Diagnosis not present

## 2019-11-20 ENCOUNTER — Ambulatory Visit: Payer: Self-pay

## 2019-11-20 DIAGNOSIS — E1169 Type 2 diabetes mellitus with other specified complication: Secondary | ICD-10-CM

## 2019-11-20 DIAGNOSIS — E785 Hyperlipidemia, unspecified: Secondary | ICD-10-CM

## 2019-11-20 DIAGNOSIS — I10 Essential (primary) hypertension: Secondary | ICD-10-CM

## 2019-11-20 NOTE — Chronic Care Management (AMB) (Signed)
Reviewed chart for medication changes ahead of medication coordination call.  No office visits, consults, or hospital visits since last CCM visit; No medication changes indicated   BP Readings from Last 3 Encounters:  09/27/19 (!) 158/90  07/27/19 140/78  07/12/19 140/80    Lab Results  Component Value Date   HGBA1C 6.4 06/01/2019    Patient obtains medications through Adherence Packaging  90 Days   Called patient and reviewed medications and coordinated delivery.  This delivery to include: atorvastatin, allopurinol, triamterene/hctz, amlodipine - all given once daily at breakfast  Patient declined the following medications:  Spiriva - filled on 09/29/19 for 90 DS  Testosterone - filled on 09/29/19 for 90 DS  Patient has refills on all needed medications.  Confirmed delivery date of 11/24/19, advised patient that pharmacy will contact them the morning of delivery.  Debbora Dus, PharmD Clinical Pharmacist [facility] at [facility] [phone]

## 2019-12-21 DIAGNOSIS — M5416 Radiculopathy, lumbar region: Secondary | ICD-10-CM | POA: Diagnosis not present

## 2019-12-21 DIAGNOSIS — M5136 Other intervertebral disc degeneration, lumbar region: Secondary | ICD-10-CM | POA: Diagnosis not present

## 2019-12-21 DIAGNOSIS — M48062 Spinal stenosis, lumbar region with neurogenic claudication: Secondary | ICD-10-CM | POA: Diagnosis not present

## 2020-01-16 ENCOUNTER — Telehealth: Payer: Self-pay | Admitting: Family Medicine

## 2020-01-16 NOTE — Telephone Encounter (Signed)
Advised patient of allergies.

## 2020-01-16 NOTE — Telephone Encounter (Signed)
Patient called today He wanted to know if you could call him to let him know what he is allergic to as far as medications etc. Patient thought he was allergic to statin but wanted to make sure. He is filling out a questionnaire and needs this as soon as possible

## 2020-02-12 ENCOUNTER — Telehealth: Payer: Self-pay

## 2020-02-12 NOTE — Telephone Encounter (Signed)
Patient is requesting a refill on the following medications to [facility]:  Diclofenac, zolpidem, tramadol, metaxalone, triamterene/HCTZ, Flonase    Thank you,  Debbora Dus, PharmD Clinical Pharmacist [facility] at [facility] [phone]

## 2020-02-13 MED ORDER — TRAMADOL HCL 50 MG PO TABS: 50.0000 mg | ORAL_TABLET | Freq: Two times a day (BID) | ORAL | 0 refills | Status: DC | PRN

## 2020-02-13 MED ORDER — DICLOFENAC SODIUM 75 MG PO TBEC: 75.0000 mg | DELAYED_RELEASE_TABLET | Freq: Two times a day (BID) | ORAL | 0 refills | Status: DC

## 2020-02-13 MED ORDER — FLUTICASONE PROPIONATE 50 MCG/ACT NA SUSP: NASAL | 1 refills | Status: DC

## 2020-02-13 MED ORDER — ZOLPIDEM TARTRATE 10 MG PO TABS: ORAL_TABLET | ORAL | 0 refills | Status: DC

## 2020-02-13 MED ORDER — TRIAMTERENE-HCTZ 37.5-25 MG PO TABS: 1.0000 | ORAL_TABLET | Freq: Every day | ORAL | 1 refills | Status: DC

## 2020-02-13 MED ORDER — METAXALONE 800 MG PO TABS: 800.0000 mg | ORAL_TABLET | Freq: Three times a day (TID) | ORAL | 0 refills | Status: DC | PRN

## 2020-02-13 NOTE — Telephone Encounter (Signed)
E-scribed triamterene-HCTZ and Flonase refills.  Fwd following refills for Dr. Darnell Level to authorize:  Name of Medication: Tramadol, Ambien Name of Pharmacy: [facility] or Written Date and Quantity:       Tramadol- 05/31/19, #30/0      Ambien- 05/28/19, #30/0 Last Office Visit and Type: 07/27/19, AWV prt 2 Next Office Visit and Type: none Last Controlled Substance Agreement Date: none Last UDS: none  Diclofenac tab- 06/09/19, #180/0 Meaxalone- 05/17/17, #270/0

## 2020-02-13 NOTE — Telephone Encounter (Signed)
ERx tramadol, [facility]

## 2020-02-13 NOTE — Addendum Note (Signed)
Addended by: [name] on: 02/13/2020 10:44 AM   Modules accepted: Orders

## 2020-02-14 DIAGNOSIS — G4733 Obstructive sleep apnea (adult) (pediatric): Secondary | ICD-10-CM | POA: Diagnosis not present

## 2020-02-21 DIAGNOSIS — L600 Ingrowing nail: Secondary | ICD-10-CM | POA: Diagnosis not present

## 2020-02-21 DIAGNOSIS — B351 Tinea unguium: Secondary | ICD-10-CM | POA: Diagnosis not present

## 2020-02-21 DIAGNOSIS — E1140 Type 2 diabetes mellitus with diabetic neuropathy, unspecified: Secondary | ICD-10-CM | POA: Diagnosis not present

## 2020-02-22 ENCOUNTER — Other Ambulatory Visit: Payer: Self-pay | Admitting: Chiropractic Medicine

## 2020-02-22 ENCOUNTER — Ambulatory Visit
Admission: RE | Admit: 2020-02-22 | Discharge: 2020-02-22 | Disposition: A | Discharge status: Home / Self Care | Payer: PPO | Attending: Chiropractic Medicine | Admitting: Chiropractic Medicine

## 2020-02-22 ENCOUNTER — Ambulatory Visit
Admission: RE | Admit: 2020-02-22 | Discharge: 2020-02-22 | Disposition: A | Discharge status: Home / Self Care | Payer: PPO | Source: Ambulatory Visit | Attending: Chiropractic Medicine | Admitting: Chiropractic Medicine

## 2020-02-22 ENCOUNTER — Other Ambulatory Visit: Payer: Self-pay

## 2020-02-22 DIAGNOSIS — M9901 Segmental and somatic dysfunction of cervical region: Secondary | ICD-10-CM | POA: Diagnosis not present

## 2020-02-22 DIAGNOSIS — M5413 Radiculopathy, cervicothoracic region: Secondary | ICD-10-CM

## 2020-02-22 DIAGNOSIS — M542 Cervicalgia: Secondary | ICD-10-CM | POA: Diagnosis not present

## 2020-02-23 ENCOUNTER — Telehealth: Payer: Self-pay

## 2020-02-23 NOTE — Telephone Encounter (Signed)
Not sure what appt he is referring to, will route to front office manager to see if he missed a phone call appt with another dpt, but no appts scheduled with PCP

## 2020-02-23 NOTE — Telephone Encounter (Signed)
Called patient to discuss. He states he realized after calling in that his appointment isn't until Monday with the pharmacist.

## 2020-02-23 NOTE — Telephone Encounter (Signed)
[facility] Night - Client Nonclinical Telephone Record AccessNurse Client [facility] Night - Client Client Site [place] Physician [name] - MD Contact Type Call Who Is Calling Patient / Member / Family / Caregiver Caller Name [place] Phone Number [phone] Patient Name Aaron Mitchell Patient DOB 1946-01-13 Call Type Message Only Information Provided Reason for Call Request for General Office Information Initial Comment caller states was supposed to have a telephone appt this afternoon and has not heard from anyone Additional Comment message sent, office hours provided, declined triage Disp. Time Disposition Final User 02/22/2020 5:37:45 PM General Information Provided Yes Birdena Jubilee Call Closed By: Birdena Jubilee Transaction Date/Time: 02/22/2020 5:33:42 PM (ET)

## 2020-02-26 ENCOUNTER — Telehealth: Payer: PPO

## 2020-02-26 NOTE — Chronic Care Management (AMB) (Deleted)
Chronic Care Management Pharmacy  Name: Aaron Mitchell  MRN: [id number] DOB: 09-29-1945  Chief Complaint/ HPI  Aaron Mitchell,  74 y.o., male presents for their Initial CCM visit with the clinical pharmacist via telephone.  PCP : Aaron Bush, MD   Specialists:  Aaron Mitchell, Ophthalmology  Their chronic conditions include: HTN, HLD, hypogonadism, allergic rhinitis, OSA, COPD, diabetic retinopathy, gout, OA, preDM, chronic back/shoulder pain, insomnia  Allergies:  Codeine (headaches)  ACE/ARB (cough)  Patient concerns: no concerns, doing well   Office Visits:  07/27/19: Aaron Mitchell AWV - updated labs, pre-DM and sugars increasing, increase water, no medication changes  07/12/19: Aaron Mitchell - Hip pain, given depo-medrol 80 mg   Medications: Outpatient Encounter Medications as of 02/26/2020  Medication Sig  . albuterol (PROVENTIL) (2.5 MG/3ML) 0.083% nebulizer solution Take 3 mLs (2.5 mg total) by nebulization every 6 (six) hours as needed for wheezing or shortness of breath.  [name] Kitchen albuterol (VENTOLIN HFA) 108 (90 Base) MCG/ACT inhaler Inhale 2 puffs into the lungs every 6 (six) hours as needed for wheezing or shortness of breath.  . allopurinol (ZYLOPRIM) 300 MG tablet TAKE 1 TABLET BY MOUTH DAILY  . amLODipine (NORVASC) 5 MG tablet Take 1 tablet (5 mg total) by mouth daily.  [name] Kitchen atorvastatin (LIPITOR) 40 MG tablet TAKE 1 TABLET(40 MG) BY MOUTH DAILY  . diclofenac (VOLTAREN) 75 MG EC tablet Take 1 tablet (75 mg total) by mouth 2 (two) times daily.  . diclofenac sodium (VOLTAREN) 1 % GEL Apply 2 g topically daily as needed. Apply to both hands.  . fluticasone (FLONASE) 50 MCG/ACT nasal spray SHAKE LIQUID AND USE 2 SPRAYS IN EACH NOSTRIL DAILY  . metaxalone (SKELAXIN) 800 MG tablet Take 1 tablet (800 mg total) by mouth 3 (three) times daily as needed for muscle spasms.  . Multiple Vitamins tablet Take 1 tablet by mouth daily.  . naproxen sodium (ANAPROX) 220 MG tablet Take  220 mg by mouth daily.   . sildenafil (VIAGRA) 100 MG tablet Take by mouth.  . tadalafil (CIALIS) 20 MG tablet Take 20 mg by mouth daily as needed for erectile dysfunction.  [name] Kitchen testosterone cypionate (DEPOTESTOSTERONE CYPIONATE) 200 MG/ML injection Inject 0.5 mLs (100 mg total) into the muscle once a week.  . tiotropium (SPIRIVA HANDIHALER) 18 MCG inhalation capsule Place 1 capsule (18 mcg total) into inhaler and inhale daily.  . traMADol (ULTRAM) 50 MG tablet Take 1 tablet (50 mg total) by mouth every 12 (twelve) hours as needed for moderate pain.  [name] Kitchen triamterene-hydrochlorothiazide (MAXZIDE-25) 37.5-25 MG tablet Take 1 tablet by mouth daily.  [name] Kitchen zolpidem (AMBIEN) 10 MG tablet TAKE 1 TABLET(10 MG) BY MOUTH AT BEDTIME AS NEEDED FOR SLEEP   Facility-Administered Encounter Medications as of 02/26/2020  Medication  . methylPREDNISolone acetate (DEPO-MEDROL) injection 80 mg    Current Diagnosis/Assessment: Goals    . Patient Stated     Starting 07/05/2018, I will continue to take medications as prescribed.     . Patient Stated     07/25/2019, I will maintain and continue medications as prescribed.     . Pharmacy Care Plan     .Current Barriers:  . Chronic Disease Management support, education, and care coordination needs related to hypertension, hyperlipidemia, hypogonadism, allergic rhinitis, COPD, diabetic retinopathy, gout, osteoarthritis, pre-diabetes, chronic back/shoulder pain, insomnia  Pharmacist Clinical Goal(s):  [name] Kitchen Pharmacist will review medication safety every 6 months . Maintain blood pressure within goal of < 140/90 mmHg . Maintain A1c within goal of < 7% .  Increase exercise slowly with goal of 150 minutes of moderate activity (such as brisk walking) each week  Interventions: . Comprehensive medication review performed. . Coordinate adherence packaging and medication delivery . Reduce amlodipine prescription from twice daily to once daily (patient is already taking this  way)  Patient Self Care Activities:  . Self-administers medications as prescribed . Self monitors vital signs, including blood pressure, heart rate, and pulse oximetry  Initial goal documentation        COPD   Last spirometry score: (03/30/17) post FEV1: 37% Gold Grade: Gold 3 (FEV1 30-49%) Current COPD Classification: low symptom burden, no reported exacerbations leading to hospitalization  Eosinophil count:   Lab Results  Component Value Date/Time   EOSPCT 2.5 06/01/2019 09:47 AM  %                               Eos (Absolute):  Lab Results  Component Value Date/Time   EOSABS 0.2 06/01/2019 09:47 AM   EOSABS 0.3 02/18/2018 08:28 AM   Tobacco Status:  Social History   Tobacco Use  Smoking Status Former Smoker  . Quit date: 07/27/1978  . Years since quitting: 41.6  Smokeless Tobacco Never Used  Tobacco Comment   Quit in 1980   Symptoms: reports Spiriva prevents SOB and has much improved his chest pain Patient has failed these meds in past: none  Pulse oximetry: (09/06/19 Self-checked while on call) 94% (usually 97-98%) Patient is currently controlled on the following medications:   Albuterol 0.083% nebulizer solution - 3 mLs every 6 hours as needed  Albuterol 90 mcg - 2 puffs every 6 hours as needed  Spiriva 18 mcg - 1 capsule into inhaler once daily Using maintenance inhaler regularly? Yes Frequency of rescue inhaler use:  1-2x a month  We discussed: Spiriva costs $100 every 90 days. We reviewed less expensive alternatives, but Spiriva is preferred. Patient is willing to pay current price due to benefits.  Plan: Continue current medications ,  Pre-Diabetes  Recent Relevant Labs: Lab Results  Component Value Date/Time   HGBA1C 6.4 06/01/2019 09:47 AM   HGBA1C 6.3 07/05/2018 10:07 AM   MICROALBUR 12.0 (H) 07/27/2019 08:46 AM    Checking BG: Never Patient is currently controlled on the following medications:   No pharmacotherapy  Last diabetic eye exam:   Lab Results  Component Value Date/Time   HMDIABEYEEXA Retinopathy (A) 01/09/2019 12:00 AM   We discussed: Patient reports gaining back weight loss of 40 lbs. He is aware A1c is slowly increasing. Not ready for dietary changes at this time.  Plan: Continue control with diet and exercise.   Hypertension   Office blood pressures are: BP Readings from Last 3 Encounters:  09/27/19 (!) 158/90  07/27/19 140/78  07/12/19 140/80   BMP Latest Ref Rng & Units 07/27/2019 06/01/2019 07/05/2018  Glucose 70 - 99 mg/dL 117(H) 208(H) 122(H)  BUN 6 - 23 mg/dL 18 19 17   Creatinine 0.40 - 1.50 mg/dL 1.18 1.35 1.11  Sodium 135 - 145 mEq/L 138 138 140  Potassium 3.5 - 5.1 mEq/L 3.9 3.9 3.8  Chloride 96 - 112 mEq/L 99 98 102  CO2 19 - 32 mEq/L 31 31 31   Calcium 8.4 - 10.5 mg/dL 9.2 9.0 9.1   Patient has failed these meds in the past: lisinopril, losartan (cough with both) Patient checks BP at home several times per month Patient home BP readings are ranging:125-130/70-80s; HR: 74-75  Patient is currently controlled on the following medications:  Amlodipine 5 mg - 1 tablet daily  Triamterene-HCTZ 37.5-25 mg- 1 tablet daily  We discussed: patient rarely takes second dose of amlodipine daily; just realized it was twice daily not too long ago, but as its his only evening medication, difficult to incorporate into schedule; BP is well controlled on 1 tablet daily  Plan: Continue triamterene-HCTZ at current dose. Discuss with Dr. Darnell Level reducing amlodipine 5 mg to once daily.   Hyperlipidemia   Lipid Panel     Component Value Date/Time   CHOL 111 07/27/2019 0846   TRIG 133.0 07/27/2019 0846   HDL 47.20 07/27/2019 0846   CHOLHDL 2 07/27/2019 0846   VLDL 26.6 07/27/2019 0846   LDLCALC 37 07/27/2019 0846   LDLDIRECT 108.0 09/14/2016 1637   No personal history of ASCVD LDL goal < 100 Reports tolerating well, no side effects Patient has failed these meds in past: none Patient is currently  controlled on the following medications:  Atorvastatin 40 mg - 1 tablet daily  Plan: Continue current medications  Gout   Uric acid (07/05/18) 5.5 Location: left great toe Last gout flare: none in years, flares usually triggered by diet Patient has failed these meds in past: none Patient is currently controlled on the following medications:   Allopurinol 300 mg - 1 tablet daily We discussed:  Confirms daily adherence  Plan: Continue current medications   Osteoarthritis   Steroid injections - several times a year Patient has failed these meds in past: none reported Location: back, hands, and hips  Patient is currently controlled on the following medications:  Naproxen 220 mg - 1 tablet daily  Uses as needed (sparingly):  Tramadol 50 mg - 1 tablet every 8 hours as needed  Diclofenac sodium 1% gel - apply 2 g daily as needed to both hands   Diclofenac gel 75 mg - 1 tablet twice daily   Metaxalone 800 mg - 1 tablet three times daily as needed  We discussed: Patient takes Aleve daily and uses all other medication a couple times a month for flare ups. We reviewed risks of taking diclofenac and Aleve together regularly.  Plan: Continue current medications  Allergies  Patient has failed these meds in past: none Patient is currently controlled on the following medications:   Flonase 50 mcg - 2 sprays in each nostril daily We discussed: takes daily, denies allergy symptoms   Plan: Continue current medications   Hypogonadism/Erectile Dysfunction  Denies any complaints/symptoms  Patient has failed these meds in past: none Patient is currently controlled on the following medications:   Sildenafil 100 mg - 1 tablet as needed  Tadalafil 20 mg - 1 tablet as needed  Testosterone cypionate 200 mg/mL- 100 mg once weekly self-injection   We discussed: patient doesn't take sildenafil and tadalafil together, likes to have 2 different kinds to prevent tachyphylaxis; aware of  drug interactions and appropriate use  Plan: Continue current medications   Insomnia   Patient has failed these meds in past: none Patient is currently controlled on the following medications:   Zolpidem 10 mg - 1 at bedtime as needed  We discussed:  Patient only takes Ambien 2-3 days a month   Plan: Continue current medications  Medication Management:  OTCs: daily multivitamin   Adherence: takes medication directly from bottles (no pillbox), takes everything in the morning, generally forgets his night-time meds (only amlodipine); keeps leftover PRN medications in freezer, discussed medication not being stored in  freezer due to potential loss of efficacy, but patient prefers to do this and has not had any issues  Cost: Spiriva $100 90 DS, most everything else is $10 for 90 DS  Pharmacy:  [facility]   Vaccines: Influenza, PCV13, PPSV23, Tdap, Zoster, Singrix -all up to date  Verbal consent obtained for [facility] enhanced pharmacy services (medication synchronization, adherence packaging, delivery coordination). A medication sync plan was created to allow patient to get all medications delivered once every 30 to 90 days per patient preference. Patient understands they have freedom to choose pharmacy and clinical pharmacist will coordinate care between all prescribers and [facility].  CCM Follow Up: Monday, February 26, 2020 at 3:30 PM (telephone)  Debbora Dus, PharmD Clinical Pharmacist [facility] at [facility] [phone]

## 2020-03-11 ENCOUNTER — Other Ambulatory Visit: Payer: PPO

## 2020-03-12 ENCOUNTER — Telehealth: Payer: Self-pay

## 2020-03-12 NOTE — Telephone Encounter (Signed)
Received faxed PA form from [place] for metaxalone 800 mg tab.   Placed form in Dr. Synthia Innocent box.

## 2020-03-12 NOTE — Progress Notes (Signed)
Chronic Care Management Pharmacy Assistant   Name: Aaron Mitchell  MRN: [id number] DOB: 10-May-1946  Reason for Encounter: Medication Review  Patient Questions:  1.  Have you seen any other providers since your last visit? Yes, 02/21/20- OV to Podiatry- no medication changes indicated.  2.  Any changes in your medicines or health? No    PCP : [name], MD  Allergies:   Allergies  Allergen Reactions  . Codeine Other (See Comments)    Other reaction(s): Other (See Comments) HEADACHES Causes headaches when he stops liquid Codeine-based products  . Ace Inhibitors Cough    Cough also present with ARB (losartan)    Medications: Outpatient Encounter Medications as of 03/12/2020  Medication Sig  . albuterol (PROVENTIL) (2.5 MG/3ML) 0.083% nebulizer solution Take 3 mLs (2.5 mg total) by nebulization every 6 (six) hours as needed for wheezing or shortness of breath.  [name] Kitchen albuterol (VENTOLIN HFA) 108 (90 Base) MCG/ACT inhaler Inhale 2 puffs into the lungs every 6 (six) hours as needed for wheezing or shortness of breath.  . allopurinol (ZYLOPRIM) 300 MG tablet TAKE 1 TABLET BY MOUTH DAILY  . amLODipine (NORVASC) 5 MG tablet Take 1 tablet (5 mg total) by mouth daily.  [name] Kitchen atorvastatin (LIPITOR) 40 MG tablet TAKE 1 TABLET(40 MG) BY MOUTH DAILY  . diclofenac (VOLTAREN) 75 MG EC tablet Take 1 tablet (75 mg total) by mouth 2 (two) times daily.  . diclofenac sodium (VOLTAREN) 1 % GEL Apply 2 g topically daily as needed. Apply to both hands.  . fluticasone (FLONASE) 50 MCG/ACT nasal spray SHAKE LIQUID AND USE 2 SPRAYS IN EACH NOSTRIL DAILY  . metaxalone (SKELAXIN) 800 MG tablet Take 1 tablet (800 mg total) by mouth 3 (three) times daily as needed for muscle spasms.  . Multiple Vitamins tablet Take 1 tablet by mouth daily.  . naproxen sodium (ANAPROX) 220 MG tablet Take 220 mg by mouth daily.   . sildenafil (VIAGRA) 100 MG tablet Take by mouth.  . tadalafil (CIALIS) 20 MG tablet Take 20 mg  by mouth daily as needed for erectile dysfunction.  [name] Kitchen testosterone cypionate (DEPOTESTOSTERONE CYPIONATE) 200 MG/ML injection Inject 0.5 mLs (100 mg total) into the muscle once a week.  . tiotropium (SPIRIVA HANDIHALER) 18 MCG inhalation capsule Place 1 capsule (18 mcg total) into inhaler and inhale daily.  . traMADol (ULTRAM) 50 MG tablet Take 1 tablet (50 mg total) by mouth every 12 (twelve) hours as needed for moderate pain.  [name] Kitchen triamterene-hydrochlorothiazide (MAXZIDE-25) 37.5-25 MG tablet Take 1 tablet by mouth daily.  [name] Kitchen zolpidem (AMBIEN) 10 MG tablet TAKE 1 TABLET(10 MG) BY MOUTH AT BEDTIME AS NEEDED FOR SLEEP   Facility-Administered Encounter Medications as of 03/12/2020  Medication  . methylPREDNISolone acetate (DEPO-MEDROL) injection 80 mg    Current Diagnosis: Patient Active Problem List   Diagnosis Date Noted  . Right facial pain 06/01/2019  . Sore throat 03/07/2019  . Cough due to ACE inhibitor 06/15/2018  . Bilateral hip pain 06/09/2018  . Background diabetic retinopathy ([place]) 08/17/2017  . Greater trochanteric bursitis of right hip 07/30/2017  . Health maintenance examination 07/14/2017  . Advanced care planning/counseling discussion 07/14/2017  . Stage 3 severe COPD by GOLD classification ([place]) 03/31/2017  . Chest congestion 12/08/2016  . Moderate mitral insufficiency 09/14/2016  . Male hypogonadism   . Insomnia 05/12/2016  . OSA (obstructive sleep apnea) 05/12/2016  . Chronic back pain   . Degenerative disc disease, lumbar   . Hyperlipidemia associated with  type 2 diabetes mellitus ([place]) 09/17/2015  . ED (erectile dysfunction) of organic origin 03/25/2015  . CMC arthritis, thumb, degenerative 01/19/2013  . Rotator cuff impingement syndrome 10/20/2011  . Chronic gouty arthropathy 04/02/2011  . Severe obesity (BMI 35.0-39.9) with comorbidity ([place]) 04/02/2011  . Osteoarthritis 04/02/2011  . Allergic rhinitis, seasonal 04/02/2011  . Chronic shoulder pain 04/02/2011    . Prediabetes 03/10/2011  . Essential (primary) hypertension 03/10/2011    Goals Addressed   None     Follow-Up:  Coordination of Enhanced Pharmacy Services  Reviewed chart for medication changes ahead of medication coordination call.  OV to Podiatry on 02/21/20 with no changes made.  No medication changes indicated   BP Readings from Last 3 Encounters:  09/27/19 (!) 158/90  07/27/19 140/78  07/12/19 140/80    Lab Results  Component Value Date   HGBA1C 6.4 06/01/2019     Patient obtains medications through Adherence Packaging  90 Days   Last adherence delivery included:  [name] Kitchen Amlodipine 5mg  one daily- 1 Breakfast  . Atorvastatin 40mg  daily- 1 Breakfast  . Triamterene 37.5mg -HCTZ 25mg  daily- 1 Breakfast  . Allopurinol 300mg  daily -1 Breakfast . Diclofenac (VIAL) - request refill . Zolpidem (VIAL) - request refill . Tramadol (VIAL) - request refill . Metaxalone (VIAL) - request refill  . Testosterone cypionate - 10 mL please . Spiriva - 90 DS . Flonase  - 90 DS  Patient did not decline any medications last month due to PRN use/additional supply on hand.   Patient is due for next adherence delivery on: no adherence delivery needed at this time. Called patient and reviewed medications.    Patient declined the following medications due to 71 DS given on 02/16/20 . Amlodipine 5mg  one daily- 1 Breakfast  . Atorvastatin 40mg  daily- 1 Breakfast  . Triamterene 37.5mg -HCTZ 25mg  daily- 1 Breakfast  . Allopurinol 300mg  daily -1 Breakfast . Diclofenac (VIAL) - request refill . Zolpidem (VIAL) - request refill . Tramadol (VIAL) - request refill . Metaxalone (VIAL) - request refill  . Testosterone cypionate - 10 mL please . Spiriva - 90 DS . Flonase  - 90 DS  Patient does not need refills for any medications at this time.  Advised patient we will call ahead of next adherence delivery date.   Martinique Uselman, [place] Pharmacist Assistant  [phone]

## 2020-03-14 ENCOUNTER — Ambulatory Visit: Payer: PPO | Admitting: Urology

## 2020-03-19 NOTE — Telephone Encounter (Signed)
Following up on Prior Authorization.

## 2020-03-19 NOTE — Telephone Encounter (Signed)
Spoke with pt notifying him it's in progress.  Pt verbalizes understanding.

## 2020-03-20 NOTE — Telephone Encounter (Signed)
Filled and in lisa's box.  

## 2020-03-21 NOTE — Telephone Encounter (Signed)
Faxed form.

## 2020-03-22 NOTE — Telephone Encounter (Signed)
Heather with [employer] left v/m requesting additional info on metaxalone PA; wants to know dx associated with med and is pt taking as prn or daily basis.

## 2020-03-22 NOTE — Telephone Encounter (Signed)
Returned call to [employer] and spoke with Lovena Le to provide requested additional info.  Told Tier Exception has already been approved.    Spoke with [facility] informing them of approval.  Says they have to order med but can have it delivered to pt on 03/25/20.

## 2020-03-22 NOTE — Telephone Encounter (Signed)
Received faxed Tier Exception approval, valid through 07/26/2020.

## 2020-03-25 ENCOUNTER — Telehealth: Payer: Self-pay | Admitting: *Deleted

## 2020-03-25 ENCOUNTER — Encounter: Payer: Self-pay | Admitting: Family Medicine

## 2020-03-25 ENCOUNTER — Telehealth: Payer: Self-pay

## 2020-03-25 MED ORDER — METAXALONE 800 MG PO TABS: 800.0000 mg | ORAL_TABLET | Freq: Three times a day (TID) | ORAL | 1 refills | Status: DC | PRN

## 2020-03-25 NOTE — Addendum Note (Signed)
Addended by: [name] on: 03/25/2020 05:27 PM   Modules accepted: Orders

## 2020-03-25 NOTE — Telephone Encounter (Signed)
Heather with [employer] left a voicemail stating that she needs a call back regarding the PA tier exception approval that they did on Metaxalone 800 mg. Nira Conn stated that there was an audit done on the approval and they need to clarify the diagnoses. Nira Conn stated that they need to know if patient is taking this chronically around the clock or intermittently? Heather requested a call back with this information Phone number [phone] ext [phone]

## 2020-03-25 NOTE — Progress Notes (Signed)
Patient is using [facility] to have a 90 day supply of medications delivered.   The patient needs a 90 day supply of the following medications:    Metaxolone  Please send refill to [facility] in [place].   Thank you,  Sherre Poot, PharmD, [facility] Clinical Pharmacist [facility] [phone] (office) [phone] (mobile)

## 2020-03-25 NOTE — Telephone Encounter (Signed)
Lvm returning Heather's call.

## 2020-03-26 NOTE — Telephone Encounter (Signed)
Lvm returning Heather's call.

## 2020-03-27 NOTE — Telephone Encounter (Signed)
Pt's husband called back in regards to this referral. He has not heard anything. I advised him it looks like a referral has not been created by Dr Darnell Level. He asked for Lattie Haw to call him back. I need to send this to Dr Darnell Level to create the referral.

## 2020-03-27 NOTE — Telephone Encounter (Signed)
Lvm returning Heather's call.

## 2020-03-28 NOTE — Telephone Encounter (Signed)
Pt called has a new # for [employer]; pt said to call [phone].

## 2020-03-28 NOTE — Telephone Encounter (Addendum)
Spoke with pt/pt's wife, [name], to see if she can come in tomorrow at 12:30.  They agreed.  Pt has been added to schedule.  FYI to Dr. Darnell Level.

## 2020-03-28 NOTE — Telephone Encounter (Signed)
Advised patient new referrals need to come through the patient herself. Alternatively she should schedule OV for further evaluation. If still available (see other patient phone note from yesterday), would offer 12:30pm appt tomorrow.

## 2020-03-29 NOTE — Telephone Encounter (Signed)
Attempted phn # provided by pt.  Got a vm.  No message left.  Will try again Tues.

## 2020-04-11 NOTE — Telephone Encounter (Addendum)
Spoke with [employer] about Tier Exception denial.  I was able to submit an appeal by phn.  Says we will be made aware of decision.

## 2020-04-12 NOTE — Telephone Encounter (Signed)
Received faxed 2nd level appeal decision for Tier Exception; denied.   Reason:  Approvable indications for the use of this medication include:  The relief of discomfort associated with acute, painful musculoskeletal conditions.   Pt is aware of decision and is asking Dr. Darnell Level to change dx to acute to get the approval.  If not, pt will pay the higher tier cost.

## 2020-04-12 NOTE — Telephone Encounter (Signed)
[place] Night - Client Nonclinical Telephone Record AccessNurse Client [place] Night - Client Client Site [place] Physician [name] - MD Contact Type Call Who Is Calling Patient / Member / Family / Caregiver Caller Name Dale Phone Number [phone] Patient Name Aaron Mitchell Patient DOB [birth year] Call Type Message Only Information Provided Reason for Call Request for General Office Information Initial Comment Caller states that They have faxed 2nd level appeal to office. Should have how to submit. Call with questions. Disp. Time Disposition Final User 04/11/2020 6:42:25 PM General Information Provided Yes Lawernce Keas Call Closed By: Lawernce Keas Transaction Date/Time: 04/11/2020 6:38:39 PM (ET)

## 2020-04-12 NOTE — Telephone Encounter (Addendum)
Spoke with pt relaying Dr. Synthia Innocent message.  Pt verbalizes understanding.   Says he takes med PRN.  FYI to Dr. Darnell Level.

## 2020-04-12 NOTE — Telephone Encounter (Addendum)
We sent in 90d supply (#270) - this doesn't correlate with acute short term painful condition, as we're treating for prolonged period of time.  Would regardless recommend using muscle relaxant sparingly and PRN. How is he taking med?

## 2020-04-15 DIAGNOSIS — E113293 Type 2 diabetes mellitus with mild nonproliferative diabetic retinopathy without macular edema, bilateral: Secondary | ICD-10-CM | POA: Diagnosis not present

## 2020-04-15 LAB — HM DIABETES EYE EXAM

## 2020-04-16 ENCOUNTER — Telehealth: Payer: Self-pay

## 2020-04-16 NOTE — Chronic Care Management (AMB) (Signed)
Chronic Care Management Pharmacy Assistant   Name: Aaron Mitchell  MRN: [id number] DOB: Nov 27, 1945  Reason for Encounter: Medication Review/ Monthly Medication Dispensing Call.  PCP : [name], MD  Allergies:   Allergies  Allergen Reactions   Codeine Other (See Comments)    Other reaction(s): Other (See Comments) HEADACHES Causes headaches when he stops liquid Codeine-based products   Ace Inhibitors Cough    Cough also present with ARB (losartan)    Medications: Outpatient Encounter Medications as of 04/16/2020  Medication Sig   albuterol (PROVENTIL) (2.5 MG/3ML) 0.083% nebulizer solution Take 3 mLs (2.5 mg total) by nebulization every 6 (six) hours as needed for wheezing or shortness of breath.   albuterol (VENTOLIN HFA) 108 (90 Base) MCG/ACT inhaler Inhale 2 puffs into the lungs every 6 (six) hours as needed for wheezing or shortness of breath.   allopurinol (ZYLOPRIM) 300 MG tablet TAKE 1 TABLET BY MOUTH DAILY   amLODipine (NORVASC) 5 MG tablet Take 1 tablet (5 mg total) by mouth daily.   atorvastatin (LIPITOR) 40 MG tablet TAKE 1 TABLET(40 MG) BY MOUTH DAILY   diclofenac (VOLTAREN) 75 MG EC tablet Take 1 tablet (75 mg total) by mouth 2 (two) times daily.   diclofenac sodium (VOLTAREN) 1 % GEL Apply 2 g topically daily as needed. Apply to both hands.   fluticasone (FLONASE) 50 MCG/ACT nasal spray SHAKE LIQUID AND USE 2 SPRAYS IN EACH NOSTRIL DAILY   metaxalone (SKELAXIN) 800 MG tablet Take 1 tablet (800 mg total) by mouth 3 (three) times daily as needed for muscle spasms.   Multiple Vitamins tablet Take 1 tablet by mouth daily.   naproxen sodium (ANAPROX) 220 MG tablet Take 220 mg by mouth daily.    sildenafil (VIAGRA) 100 MG tablet Take by mouth.   tadalafil (CIALIS) 20 MG tablet Take 20 mg by mouth daily as needed for erectile dysfunction.   testosterone cypionate (DEPOTESTOSTERONE CYPIONATE) 200 MG/ML injection Inject 0.5 mLs (100 mg total) into  the muscle once a week.   tiotropium (SPIRIVA HANDIHALER) 18 MCG inhalation capsule Place 1 capsule (18 mcg total) into inhaler and inhale daily.   traMADol (ULTRAM) 50 MG tablet Take 1 tablet (50 mg total) by mouth every 12 (twelve) hours as needed for moderate pain.   triamterene-hydrochlorothiazide (MAXZIDE-25) 37.5-25 MG tablet Take 1 tablet by mouth daily.   zolpidem (AMBIEN) 10 MG tablet TAKE 1 TABLET(10 MG) BY MOUTH AT BEDTIME AS NEEDED FOR SLEEP   Facility-Administered Encounter Medications as of 04/16/2020  Medication   methylPREDNISolone acetate (DEPO-MEDROL) injection 80 mg    Current Diagnosis: Patient Active Problem List   Diagnosis Date Noted   Right facial pain 06/01/2019   Sore throat 03/07/2019   Cough due to ACE inhibitor 06/15/2018   Bilateral hip pain 06/09/2018   Background diabetic retinopathy ([place]) 08/17/2017   Greater trochanteric bursitis of right hip 07/30/2017   Health maintenance examination 07/14/2017   Advanced care planning/counseling discussion 07/14/2017   Stage 3 severe COPD by GOLD classification ([place]) 03/31/2017   Chest congestion 12/08/2016   Moderate mitral insufficiency 09/14/2016   Male hypogonadism    Insomnia 05/12/2016   OSA (obstructive sleep apnea) 05/12/2016   Chronic back pain    Degenerative disc disease, lumbar    Hyperlipidemia associated with type 2 diabetes mellitus ([place]) 09/17/2015   ED (erectile dysfunction) of organic origin 03/25/2015   CMC arthritis, thumb, degenerative 01/19/2013   Rotator cuff impingement syndrome 10/20/2011   Chronic gouty arthropathy  04/02/2011   Severe obesity (BMI 35.0-39.9) with comorbidity ([place]) 04/02/2011   Osteoarthritis 04/02/2011   Allergic rhinitis, seasonal 04/02/2011   Chronic shoulder pain 04/02/2011   Prediabetes 03/10/2011   Essential (primary) hypertension 03/10/2011    Goals Addressed   None    Reviewed chart for medication changes ahead of  medication coordination call.  No OVs, Consults, medication changes or hospital visits since last care coordination call/Pharmacist visit.    BP Readings from Last 3 Encounters:  09/27/19 (!) 158/90  07/27/19 140/78  07/12/19 140/80    Lab Results  Component Value Date   HGBA1C 6.4 06/01/2019     Patient obtains medications through Adherence Packaging  90 Days   Last adherence delivery included: None needed due to receiving a 90 day supply adherence packaging on 02/14/2020.   Patient declined: Amlodipine 5 mg one tablet daily, Atorvastatin 40 mg 1 tablet daily, Triamterene 37.5 mg-HCTZ 25 mg 1 tablet  Daily, Allopurinol 300 mg 1 tablet daily, Diclofenac 75 mg 1 tablet twice daily, Zolpidem 10 mg- 1 tablet at bedtime as needed, Tramadol 50 mg- 1 tablet twice a day as needed, Metaxalone 800 mg - 1 tablet three times a day as needed, Testosterone cypionate 200 mg/ml- Inject 0.5 ml IM once weekly, Spiriva 18 mcg- Place 1 capsule into inhaler and inhale daily, Flonase - 2 prays in each nostril daily last month due to receiving a 90 Day supply on 02/12/20.  Patient is due for next adherence delivery on: 05/14/2020.  Called patient and reviewed medications  In reviewing medications with patient, Metaxalone 800 mg from pharmacy profile is due to be filled 04/21/2020, patient states he is not out of medication due to using as needed. Patient states he will need one with next adherence delivery on 05/14/2020. Confirmed no medication refills needed at this time.    Follow-Up:  Coordination of Enhanced Pharmacy Services and Pharmacist Review- Patient is not in need of any medication refills at this time, Donette Larry, CPP notified.  Pattricia Boss, [place] Pharmacist Assistant [phone]

## 2020-04-17 ENCOUNTER — Encounter: Payer: Self-pay | Admitting: Family Medicine

## 2020-04-19 ENCOUNTER — Encounter: Payer: Self-pay | Admitting: Family Medicine

## 2020-04-19 NOTE — Telephone Encounter (Signed)
Can we move his appointment up to Monday?

## 2020-04-19 NOTE — Telephone Encounter (Signed)
Pt called and already r/s appointment

## 2020-04-22 ENCOUNTER — Ambulatory Visit
Admission: RE | Admit: 2020-04-22 | Discharge: 2020-04-22 | Disposition: A | Discharge status: Home / Self Care | Payer: PPO | Source: Ambulatory Visit | Attending: Family Medicine | Admitting: Family Medicine

## 2020-04-22 ENCOUNTER — Other Ambulatory Visit: Payer: Self-pay

## 2020-04-22 ENCOUNTER — Ambulatory Visit (INDEPENDENT_AMBULATORY_CARE_PROVIDER_SITE_OTHER)
Admission: RE | Admit: 2020-04-22 | Discharge: 2020-04-22 | Disposition: A | Discharge status: Home / Self Care | Payer: PPO | Source: Ambulatory Visit | Attending: Family Medicine | Admitting: Family Medicine

## 2020-04-22 ENCOUNTER — Encounter: Payer: Self-pay | Admitting: Family Medicine

## 2020-04-22 ENCOUNTER — Ambulatory Visit (INDEPENDENT_AMBULATORY_CARE_PROVIDER_SITE_OTHER): Payer: PPO | Admitting: Family Medicine

## 2020-04-22 VITALS — BP 140/66 | HR 72 | Temp 98.2°F | Ht 69.0 in | Wt 280.0 lb

## 2020-04-22 DIAGNOSIS — M11262 Other chondrocalcinosis, left knee: Secondary | ICD-10-CM

## 2020-04-22 DIAGNOSIS — M1712 Unilateral primary osteoarthritis, left knee: Secondary | ICD-10-CM | POA: Diagnosis not present

## 2020-04-22 DIAGNOSIS — M25562 Pain in left knee: Secondary | ICD-10-CM

## 2020-04-22 DIAGNOSIS — G8929 Other chronic pain: Secondary | ICD-10-CM

## 2020-04-22 DIAGNOSIS — M1711 Unilateral primary osteoarthritis, right knee: Secondary | ICD-10-CM | POA: Diagnosis not present

## 2020-04-22 DIAGNOSIS — M11261 Other chondrocalcinosis, right knee: Secondary | ICD-10-CM | POA: Diagnosis not present

## 2020-04-22 DIAGNOSIS — M170 Bilateral primary osteoarthritis of knee: Secondary | ICD-10-CM

## 2020-04-22 DIAGNOSIS — M25561 Pain in right knee: Secondary | ICD-10-CM

## 2020-04-22 MED ORDER — METHYLPREDNISOLONE ACETATE 40 MG/ML IJ SUSP
80.0000 mg | Freq: Once | INTRAMUSCULAR | Status: AC
  Administered 2020-04-22: 80 mg via INTRA_ARTICULAR

## 2020-04-22 NOTE — Progress Notes (Signed)
Aaron Lia T. Miquel Lamson, MD, [place] at [facility] [place], [address]  Phone: [phone]  FAX: [phone]  Aaron Mitchell - 74 y.o. male  MRN [id number]  Date of Birth: June 24, 1946  Date: 04/22/2020  PCP: [name], MD  Referral: [name], MD  Chief Complaint  Patient presents with  . Knee Pain    Bilateral  . Hip Pain    This visit occurred during the SARS-CoV-2 public health emergency.  Safety protocols were in place, including screening questions prior to the visit, additional usage of staff PPE, and extensive cleaning of exam room while observing appropriate contact time as indicated for disinfecting solutions.   Subjective:   Aaron Mitchell is a 73 y.o. very pleasant male patient with Body mass index is 41.35 kg/m. who presents with the following:  He is a known patient with B knee pain.  He is a well-known patient, and I have seen him multiple times for bilateral hip pain.  He is here today with a right greater than left knee pain with a significant right-sided effusion.  He does not have any redness or warmth.  He is not bending, trauma or injury.  He has had intermittent knee pain off and on for quite some time.  He does have a history of reported gout, cannot find a joint aspirate evaluation, and it looks like all of his uric acid levels have been normal.  Uric acid levels have been normal  B knee injections Aspirate R knee and check for CPPD    Review of Systems is noted in the HPI, as appropriate   Objective:   BP 140/66   Pulse 72   Temp 98.2 F (36.8 C) (Temporal)   Ht 5\' 9"  (1.753 m)   Wt 280 lb (127 kg)   SpO2 96%   BMI 41.35 kg/m    GEN: No acute distress; alert,appropriate. PULM: Breathing comfortably in no respiratory distress PSYCH: Normally interactive.    Bilateral knee exam: Patient does have a moderate ballotable  effusion on the right.  Bilateral, full extension and flexion to 120.  Stable to varus and valgus stress.  ACL and PCL are intact.  Forced flexion causes pain in any way.  Bounce home testing is negative.  No swelling at the calf.  He does have medial greater than lateral joint line bilaterally tenderness.  Radiology: DG Knee 4 Views W/Patella Left  Result Date: 04/22/2020 CLINICAL DATA:  Chronic bilateral knee pain EXAM: LEFT KNEE - COMPLETE 4+ VIEW; RIGHT KNEE - COMPLETE 4+ VIEW COMPARISON:  None. FINDINGS: No acute fracture or malalignment of the bilateral knees. Mild tricompartmental joint space narrowing and osteophytosis most pronounced within the patellofemoral compartments. Prominent chondrocalcinosis bilaterally, left greater than right. Small right and trace left knee joint effusions. Scattered vascular calcifications. IMPRESSION: 1. Mild tricompartmental degenerative changes of the bilateral knees, most pronounced within the patellofemoral compartments. 2. Chondrocalcinosis. Electronically Signed   By: Davina Poke D.O.   On: 04/22/2020 15:38   DG Knee 4 Views W/Patella Right  Result Date: 04/22/2020 CLINICAL DATA:  Chronic bilateral knee pain EXAM: LEFT KNEE - COMPLETE 4+ VIEW; RIGHT KNEE - COMPLETE 4+ VIEW COMPARISON:  None. FINDINGS: No acute fracture or malalignment of the bilateral knees. Mild tricompartmental joint space narrowing and osteophytosis most pronounced within the patellofemoral compartments. Prominent chondrocalcinosis bilaterally, left greater than right. Small right and trace left knee joint effusions.  Scattered vascular calcifications. IMPRESSION: 1. Mild tricompartmental degenerative changes of the bilateral knees, most pronounced within the patellofemoral compartments. 2. Chondrocalcinosis. Electronically Signed   By: Davina Poke D.O.   On: 04/22/2020 15:38     Assessment and Plan:     ICD-10-CM   1. Primary osteoarthritis of knees, bilateral  M17.0   2.  Chronic pain of both knees  M25.561 DG Knee 4 Views W/Patella Left   M25.562 DG Knee 4 Views W/Patella Right   G89.29 Synovial cell count + diff, w/ crystals    Anaerobic and Aerobic Culture    methylPREDNISolone acetate (DEPO-MEDROL) injection 80 mg    methylPREDNISolone acetate (DEPO-MEDROL) injection 80 mg  3. Chondrocalcinosis of both knees  M11.261 Synovial cell count + diff, w/ crystals   M11.262 Anaerobic and Aerobic Culture   The most prominent aspect of his bilateral knee exam is significant chondrocalcinosis.  This is very often associated with CPPD, so we will send a joint aspirate to the lab to look for crystals.  He does have crystals, then schedule colchicine would be of significant benefit particularly since he does have some chronic pain.  Exacerbation of known osteoarthritis and chronic knee pain.  Aspiration/Injection Procedure Note Aaron Mitchell 1945-11-28 Date of procedure: 04/22/2020  Procedure: Large Joint Aspiration / Injection with synovial fluid aspiration of knee, R Indications: Pain  Procedure Details Patient verbally consented; risks, benefits, and alternatives explained including possible infection. Patient prepped with Chloraprep. Ethyl chloride for anesthesia. 10 cc of 1% Lidocaine used in wheal then injected Subcutaneous fashion with 22 gauge needle on lateral approach. Under sterilne conditions, 18 gauge needle used via lateral approach to aspirate 40 cc of yellowish synovial fluid. Then 8 cc of Lidocaine 1% and 2 mL of Depo-Medrol 40 mg injected. Tolerated well, decreased pain, no complications. Medication: 2 mL of Depo-Medrol 40 mg, equaling Depo-Medrol 80 mg total  Aspiration/Injection Procedure Note Aaron Mitchell January 06, 1946 Date of procedure: 04/22/2020  Procedure: Large Joint Aspiration / Injection of Knee, L Indications: Pain  Procedure Details Patient verbally consented to procedure. Risks (including potential rare risk of infection),  benefits, and alternatives explained. Sterilely prepped with Chloraprep. Ethyl cholride used for anesthesia. 8 cc Lidocaine 1% mixed with 2 mL Depo-Medrol 40 mg injected using the anteromedial approach without difficulty. No complications with procedure and tolerated well. Patient had decreased pain post-injection. Medication: 2 mL of Depo-Medrol 40 mg, equaling Depo-Medrol 80 mg total  Meds ordered this encounter  Medications  . methylPREDNISolone acetate (DEPO-MEDROL) injection 80 mg  . methylPREDNISolone acetate (DEPO-MEDROL) injection 80 mg   There are no discontinued medications. Orders Placed This Encounter  Procedures  . Anaerobic and Aerobic Culture  . DG Knee 4 Views W/Patella Left  . DG Knee 4 Views W/Patella Right  . Synovial cell count + diff, w/ crystals    Follow-up: No follow-ups on file.  Signed,  Maud Deed. Terren Haberle, MD   Outpatient Encounter Medications as of 04/22/2020  Medication Sig  . albuterol (PROVENTIL) (2.5 MG/3ML) 0.083% nebulizer solution Take 3 mLs (2.5 mg total) by nebulization every 6 (six) hours as needed for wheezing or shortness of breath.  [name] Kitchen albuterol (VENTOLIN HFA) 108 (90 Base) MCG/ACT inhaler Inhale 2 puffs into the lungs every 6 (six) hours as needed for wheezing or shortness of breath.  . allopurinol (ZYLOPRIM) 300 MG tablet TAKE 1 TABLET BY MOUTH DAILY  . amLODipine (NORVASC) 5 MG tablet Take 1 tablet (5 mg total) by mouth daily.  [name] Kitchen atorvastatin (  LIPITOR) 40 MG tablet TAKE 1 TABLET(40 MG) BY MOUTH DAILY  . diclofenac (VOLTAREN) 75 MG EC tablet Take 1 tablet (75 mg total) by mouth 2 (two) times daily.  . diclofenac sodium (VOLTAREN) 1 % GEL Apply 2 g topically daily as needed. Apply to both hands.  . fluticasone (FLONASE) 50 MCG/ACT nasal spray SHAKE LIQUID AND USE 2 SPRAYS IN EACH NOSTRIL DAILY  . metaxalone (SKELAXIN) 800 MG tablet Take 1 tablet (800 mg total) by mouth 3 (three) times daily as needed for muscle spasms.  . Multiple Vitamins  tablet Take 1 tablet by mouth daily.  . naproxen sodium (ANAPROX) 220 MG tablet Take 220 mg by mouth daily.   . sildenafil (VIAGRA) 100 MG tablet Take by mouth.  . tadalafil (CIALIS) 20 MG tablet Take 20 mg by mouth daily as needed for erectile dysfunction.  [name] Kitchen testosterone cypionate (DEPOTESTOSTERONE CYPIONATE) 200 MG/ML injection Inject 0.5 mLs (100 mg total) into the muscle once a week.  . tiotropium (SPIRIVA HANDIHALER) 18 MCG inhalation capsule Place 1 capsule (18 mcg total) into inhaler and inhale daily.  . traMADol (ULTRAM) 50 MG tablet Take 1 tablet (50 mg total) by mouth every 12 (twelve) hours as needed for moderate pain.  [name] Kitchen triamterene-hydrochlorothiazide (MAXZIDE-25) 37.5-25 MG tablet Take 1 tablet by mouth daily.  [name] Kitchen zolpidem (AMBIEN) 10 MG tablet TAKE 1 TABLET(10 MG) BY MOUTH AT BEDTIME AS NEEDED FOR SLEEP   Facility-Administered Encounter Medications as of 04/22/2020  Medication  . methylPREDNISolone acetate (DEPO-MEDROL) injection 80 mg  . [COMPLETED] methylPREDNISolone acetate (DEPO-MEDROL) injection 80 mg  . [COMPLETED] methylPREDNISolone acetate (DEPO-MEDROL) injection 80 mg

## 2020-04-23 ENCOUNTER — Encounter: Payer: Self-pay | Admitting: Family Medicine

## 2020-04-24 ENCOUNTER — Ambulatory Visit: Payer: PPO | Admitting: Family Medicine

## 2020-04-28 LAB — ANAEROBIC AND AEROBIC CULTURE
AER RESULT:: NO GROWTH
MICRO NUMBER:: 10999295
MICRO NUMBER:: 10999296
SPECIMEN QUALITY:: ADEQUATE
SPECIMEN QUALITY:: ADEQUATE

## 2020-04-28 LAB — SYNOVIAL FLUID ANALYSIS, COMPLETE
Basophils, %: 0 %
Eosinophils-Synovial: 0 % (ref 0–2)
Lymphocytes-Synovial Fld: 51 % (ref 0–74)
Monocyte/Macrophage: 3 % (ref 0–69)
Neutrophil, Synovial: 38 % — ABNORMAL HIGH (ref 0–24)
Synoviocytes, %: 8 % (ref 0–15)
WBC, Synovial: 306 cells/uL — ABNORMAL HIGH (ref <150)

## 2020-04-30 DIAGNOSIS — M5126 Other intervertebral disc displacement, lumbar region: Secondary | ICD-10-CM | POA: Diagnosis not present

## 2020-04-30 DIAGNOSIS — M5416 Radiculopathy, lumbar region: Secondary | ICD-10-CM | POA: Diagnosis not present

## 2020-05-16 ENCOUNTER — Telehealth: Payer: Self-pay

## 2020-05-16 DIAGNOSIS — G4733 Obstructive sleep apnea (adult) (pediatric): Secondary | ICD-10-CM | POA: Diagnosis not present

## 2020-05-16 NOTE — Chronic Care Management (AMB) (Signed)
Chronic Care Management Pharmacy Assistant   Name: Aaron Mitchell  MRN: [id number] DOB: 07/17/1946  Reason for Encounter: Medication Review/ Monthly Dispensing Call  Patient Questions:  1.  Have you seen any other providers since your last visit? Yes, 04/22/2020- Dr Edilia Bo (Fam Med), 04/30/2020- Dr Sharlet Salina (Physical Medicine and Rehabilitation)  2.  Any changes in your medicines or health? Yes, Dexamethasone 10 mg/ml injection, Iohexol 240 mg iodine/ml 4 ml injection and Lidocaine 2% 1 ml injection given 04/30/2020.    PCP : [name], MD  Allergies:   Allergies  Allergen Reactions  . Codeine Other (See Comments)    Other reaction(s): Other (See Comments) HEADACHES Causes headaches when he stops liquid Codeine-based products  . Ace Inhibitors Cough    Cough also present with ARB (losartan)    Medications: Outpatient Encounter Medications as of 05/16/2020  Medication Sig  . albuterol (PROVENTIL) (2.5 MG/3ML) 0.083% nebulizer solution Take 3 mLs (2.5 mg total) by nebulization every 6 (six) hours as needed for wheezing or shortness of breath.  [name] Kitchen albuterol (VENTOLIN HFA) 108 (90 Base) MCG/ACT inhaler Inhale 2 puffs into the lungs every 6 (six) hours as needed for wheezing or shortness of breath.  . allopurinol (ZYLOPRIM) 300 MG tablet TAKE 1 TABLET BY MOUTH DAILY  . amLODipine (NORVASC) 5 MG tablet Take 1 tablet (5 mg total) by mouth daily.  [name] Kitchen atorvastatin (LIPITOR) 40 MG tablet TAKE 1 TABLET(40 MG) BY MOUTH DAILY  . diclofenac (VOLTAREN) 75 MG EC tablet Take 1 tablet (75 mg total) by mouth 2 (two) times daily.  . diclofenac sodium (VOLTAREN) 1 % GEL Apply 2 g topically daily as needed. Apply to both hands.  . fluticasone (FLONASE) 50 MCG/ACT nasal spray SHAKE LIQUID AND USE 2 SPRAYS IN EACH NOSTRIL DAILY  . metaxalone (SKELAXIN) 800 MG tablet Take 1 tablet (800 mg total) by mouth 3 (three) times daily as needed for muscle spasms.  . Multiple Vitamins tablet Take 1  tablet by mouth daily.  . naproxen sodium (ANAPROX) 220 MG tablet Take 220 mg by mouth daily.   . sildenafil (VIAGRA) 100 MG tablet Take by mouth.  . tadalafil (CIALIS) 20 MG tablet Take 20 mg by mouth daily as needed for erectile dysfunction.  [name] Kitchen testosterone cypionate (DEPOTESTOSTERONE CYPIONATE) 200 MG/ML injection Inject 0.5 mLs (100 mg total) into the muscle once a week.  . tiotropium (SPIRIVA HANDIHALER) 18 MCG inhalation capsule Place 1 capsule (18 mcg total) into inhaler and inhale daily.  . traMADol (ULTRAM) 50 MG tablet Take 1 tablet (50 mg total) by mouth every 12 (twelve) hours as needed for moderate pain.  [name] Kitchen triamterene-hydrochlorothiazide (MAXZIDE-25) 37.5-25 MG tablet Take 1 tablet by mouth daily.  [name] Kitchen zolpidem (AMBIEN) 10 MG tablet TAKE 1 TABLET(10 MG) BY MOUTH AT BEDTIME AS NEEDED FOR SLEEP   Facility-Administered Encounter Medications as of 05/16/2020  Medication  . methylPREDNISolone acetate (DEPO-MEDROL) injection 80 mg    Current Diagnosis: Patient Active Problem List   Diagnosis Date Noted  . Right facial pain 06/01/2019  . Sore throat 03/07/2019  . Cough due to ACE inhibitor 06/15/2018  . Bilateral hip pain 06/09/2018  . Background diabetic retinopathy ([place]) 08/17/2017  . Greater trochanteric bursitis of right hip 07/30/2017  . Health maintenance examination 07/14/2017  . Advanced care planning/counseling discussion 07/14/2017  . Stage 3 severe COPD by GOLD classification ([place]) 03/31/2017  . Chest congestion 12/08/2016  . Moderate mitral insufficiency 09/14/2016  . Male hypogonadism   . Insomnia  05/12/2016  . OSA (obstructive sleep apnea) 05/12/2016  . Chronic back pain   . Degenerative disc disease, lumbar   . Hyperlipidemia associated with type 2 diabetes mellitus ([place]) 09/17/2015  . ED (erectile dysfunction) of organic origin 03/25/2015  . CMC arthritis, thumb, degenerative 01/19/2013  . Rotator cuff impingement syndrome 10/20/2011  . Chronic gouty  arthropathy 04/02/2011  . Severe obesity (BMI 35.0-39.9) with comorbidity ([place]) 04/02/2011  . Osteoarthritis 04/02/2011  . Allergic rhinitis, seasonal 04/02/2011  . Chronic shoulder pain 04/02/2011  . Prediabetes 03/10/2011  . Essential (primary) hypertension 03/10/2011   Reviewed chart for medication changes ahead of medication coordination call.   OVs and Consults- 04/22/2020- Dr Edilia Bo (Fam Med), 04/30/2020- Dr Sharlet Salina (Physical Medicine and Rehabilitation)  since last care coordination call/Pharmacist visit.  Patient received Dexamethasone 10 mg/ml injection, Iohexol 240 mg iodine/ml 4 ml injection and Lidocaine 2% 1 ml injection given 04/30/2020, no other medication changes.  BP Readings from Last 3 Encounters:  04/22/20 140/66  09/27/19 (!) 158/90  07/27/19 140/78    Lab Results  Component Value Date   HGBA1C 6.4 06/01/2019     Patient obtains medications through Adherence Packaging  90 Days   Last adherence delivery included: Medication not needed during last adherence call on 04/16/2020.   Patient declined:  Amlodipine 5 mg one tablet daily, Atorvastatin 40 mg 1 tablet daily, Triamterene 37.5 mg-HCTZ 25 mg 1 tablet  Daily, Allopurinol 300 mg 1 tablet daily, Diclofenac 75 mg 1 tablet twice daily, Zolpidem 10 mg- 1 tablet at bedtime as needed, Tramadol 50 mg- 1 tablet twice a day as needed, Metaxalone 800 mg - 1 tablet three times a day as needed, Testosterone cypionate 200 mg/ml- Inject 0.5 ml IM once weekly, Spiriva 18 mcg- Place 1 capsule into inhaler and inhale daily, Flonase - 2 prays in each nostril daily last month due to receiving a 90 Day supply on 02/14/20.  Patient is due for next adherence delivery on: 05/17/2020. Called patient and reviewed medications and coordinated delivery.  This delivery to include: Amlodipine 5 mg one tablet daily, Atorvastatin 40 mg 1 tablet daily, Triamterene 37.5 mg-HCTZ 25 mg 1 tablet  Daily, Allopurinol 300 mg 1 tablet daily, Diclofenac  75 mg 1 tablet twice daily, Zolpidem 10 mg- 1 tablet at bedtime as needed, Testosterone cypionate 200 mg/ml- Inject 0.5 ml IM once weekly, Spiriva 18 mcg- Place 1 capsule into inhaler and inhale daily.  Acute fill or short fill not needed.   Patient declined the following medications Metaxalone 800 mg due PRN use, Tramadol 50 mg due to PRN use and Flonase due to PRN use. Patient has an adequate supply on hand of all medications.   Patient needs refills for Diclofenac 75 mg 1 tablet twice daily, Zolpidem 10 mg- 1 tablet at bedtime as needed.   Confirmed delivery date of 05/17/2020, advised patient that pharmacy will contact them the morning of delivery.  Follow-Up:  Coordination of Enhanced Pharmacy Services and Pharmacist Review  Patient would like to have the Zolpidem in vials and instead of in the packaging. Donette Larry, CPP notified.  Pattricia Boss, [place] Pharmacist Assistant [phone]

## 2020-05-17 ENCOUNTER — Other Ambulatory Visit: Payer: Self-pay | Admitting: Urology

## 2020-05-17 ENCOUNTER — Other Ambulatory Visit: Payer: Self-pay

## 2020-05-17 NOTE — Telephone Encounter (Signed)
Aaron Mitchell with [facility] pharmacy [phone]) called the office today requesting a refill on behalf of the patient for testosterone cypionate.  Please advise.

## 2020-05-17 NOTE — Telephone Encounter (Signed)
Testosterone prescribed by urology.  Made Clarise Cruz aware.   Name of Medication: Ambien Name of Pharmacy: [facility] Last Fill or Written Date and Quantity: 02/13/20, #30 Last Office Visit and Type: 07/27/19, AWV prt 2 Next Office Visit and Type: none Last Controlled Substance Agreement Date: none Last UDS: none  Diclofenac 75 mg tab last rx:  02/13/20, #180

## 2020-05-17 NOTE — Telephone Encounter (Signed)
-----   Message from Aaron Mitchell, [facility] sent at 05/16/2020  1:29 PM EDT ----- Regarding: refill request Good afternoon,   Mr. Aaron Mitchell is due for his 3 month delivery of medications and needs refills sent to [facility] for:   Testosterone Zolpidem  Diclofenac  Thank you! Aaron Mitchell

## 2020-05-19 MED ORDER — ZOLPIDEM TARTRATE 10 MG PO TABS: ORAL_TABLET | ORAL | 0 refills | Status: DC

## 2020-05-19 MED ORDER — DICLOFENAC SODIUM 75 MG PO TBEC
75.0000 mg | DELAYED_RELEASE_TABLET | Freq: Two times a day (BID) | ORAL | 0 refills | Status: DC
Start: 2020-05-19 — End: 2021-02-12

## 2020-05-19 NOTE — Telephone Encounter (Signed)
ERx 

## 2020-05-23 ENCOUNTER — Telehealth: Payer: Self-pay

## 2020-05-23 NOTE — Chronic Care Management (AMB) (Signed)
Chronic Care Management Pharmacy Assistant   Name: Aaron Mitchell  MRN: [id number] DOB: [birth year]  Reason for Encounter: Medication Review - Medication Adherence.  [name] Kitchen  PCP : Aaron Bush, MD  Allergies:   Allergies  Allergen Reactions  . Codeine Other (See Comments)    Other reaction(s): Other (See Comments) HEADACHES Causes headaches when he stops liquid Codeine-based products  . Ace Inhibitors Cough    Cough also present with ARB (losartan)    Medications: Outpatient Encounter Medications as of 05/23/2020  Medication Sig  . albuterol (PROVENTIL) (2.5 MG/3ML) 0.083% nebulizer solution Take 3 mLs (2.5 mg total) by nebulization every 6 (six) hours as needed for wheezing or shortness of breath.  [name] Kitchen albuterol (VENTOLIN HFA) 108 (90 Base) MCG/ACT inhaler Inhale 2 puffs into the lungs every 6 (six) hours as needed for wheezing or shortness of breath.  . allopurinol (ZYLOPRIM) 300 MG tablet TAKE 1 TABLET BY MOUTH DAILY  . amLODipine (NORVASC) 5 MG tablet Take 1 tablet (5 mg total) by mouth daily.  [name] Kitchen atorvastatin (LIPITOR) 40 MG tablet TAKE 1 TABLET(40 MG) BY MOUTH DAILY  . diclofenac (VOLTAREN) 75 MG EC tablet Take 1 tablet (75 mg total) by mouth 2 (two) times daily.  . diclofenac sodium (VOLTAREN) 1 % GEL Apply 2 g topically daily as needed. Apply to both hands.  . fluticasone (FLONASE) 50 MCG/ACT nasal spray SHAKE LIQUID AND USE 2 SPRAYS IN EACH NOSTRIL DAILY  . metaxalone (SKELAXIN) 800 MG tablet Take 1 tablet (800 mg total) by mouth 3 (three) times daily as needed for muscle spasms.  . Multiple Vitamins tablet Take 1 tablet by mouth daily.  . naproxen sodium (ANAPROX) 220 MG tablet Take 220 mg by mouth daily.   . sildenafil (VIAGRA) 100 MG tablet Take by mouth.  . tadalafil (CIALIS) 20 MG tablet Take 20 mg by mouth daily as needed for erectile dysfunction.  [name] Kitchen testosterone cypionate (DEPOTESTOSTERONE CYPIONATE) 200 MG/ML injection Inject 0.5 mLs (100 mg total) into the  muscle once a week.  . tiotropium (SPIRIVA HANDIHALER) 18 MCG inhalation capsule Place 1 capsule (18 mcg total) into inhaler and inhale daily.  . traMADol (ULTRAM) 50 MG tablet Take 1 tablet (50 mg total) by mouth every 12 (twelve) hours as needed for moderate pain.  [name] Kitchen triamterene-hydrochlorothiazide (MAXZIDE-25) 37.5-25 MG tablet Take 1 tablet by mouth daily.  [name] Kitchen zolpidem (AMBIEN) 10 MG tablet TAKE 1 TABLET(10 MG) BY MOUTH AT BEDTIME AS NEEDED FOR SLEEP   Facility-Administered Encounter Medications as of 05/23/2020  Medication  . methylPREDNISolone acetate (DEPO-MEDROL) injection 80 mg    Current Diagnosis: Patient Active Problem List   Diagnosis Date Noted  . Right facial pain 06/01/2019  . Sore throat 03/07/2019  . Cough due to ACE inhibitor 06/15/2018  . Bilateral hip pain 06/09/2018  . Background diabetic retinopathy ([place]) 08/17/2017  . Greater trochanteric bursitis of right hip 07/30/2017  . Health maintenance examination 07/14/2017  . Advanced care planning/counseling discussion 07/14/2017  . Stage 3 severe COPD by GOLD classification ([place]) 03/31/2017  . Chest congestion 12/08/2016  . Moderate mitral insufficiency 09/14/2016  . Male hypogonadism   . Insomnia 05/12/2016  . OSA (obstructive sleep apnea) 05/12/2016  . Chronic back pain   . Degenerative disc disease, lumbar   . Hyperlipidemia associated with type 2 diabetes mellitus ([place]) 09/17/2015  . ED (erectile dysfunction) of organic origin 03/25/2015  . CMC arthritis, thumb, degenerative 01/19/2013  . Rotator cuff impingement syndrome 10/20/2011  . Chronic gouty  arthropathy 04/02/2011  . Severe obesity (BMI 35.0-39.9) with comorbidity ([place]) 04/02/2011  . Osteoarthritis 04/02/2011  . Allergic rhinitis, seasonal 04/02/2011  . Chronic shoulder pain 04/02/2011  . Prediabetes 03/10/2011  . Essential (primary) hypertension 03/10/2011     Follow-Up:  Pharmacist Review Reviewed chart and adherence measures. Per [employer], medication adherence for cholesterol (statins) are at 90-99% med compliance.  Aaron Mitchell,CPP. Notified  Aaron Mitchell, [place] Pharmacist Assistant [phone]

## 2020-05-24 MED ORDER — TESTOSTERONE CYPIONATE 200 MG/ML IM SOLN
100.0000 mg | INTRAMUSCULAR | 2 refills | Status: DC
Start: 2020-05-24 — End: 2021-02-12

## 2020-06-02 NOTE — Progress Notes (Deleted)
    Aaron Sugrue T. Xsavier Seeley, MD, [place] at [facility] [place], [address]  Phone: [phone]  FAX: [phone]  Aaron Mitchell - 74 y.o. male  MRN [id number]  Date of Birth: Aug 19, 1945  Date: 06/03/2020  PCP: [name], MD  Referral: [name], MD  No chief complaint on file.   This visit occurred during the SARS-CoV-2 public health emergency.  Safety protocols were in place, including screening questions prior to the visit, additional usage of staff PPE, and extensive cleaning of exam room while observing appropriate contact time as indicated for disinfecting solutions.    Procedure only:  I have seen him multiple times with some lateral hip pain and trochanteric bursitis. The last time was approaching 1 year ago. He is responded well to some trochanteric bursa injections. He does have some other medical issues and chronic orthopedic problems and chronic pain as well as back pain.  Aspiration/Injection Procedure Note Aaron Mitchell [birth year] Date of procedure: 06/03/2020  Procedure: Large Joint Aspiration / Injection of Hip, Trochanteric Bursa, R Indications: Pain  Procedure Details Verbal consent obtained. Risks (including infection, potential atrophy), benefits, and alternatives reviewed. Greater trochanter sterilely prepped with Chloraprep. Ethyl Chloride used for anesthesia. 9 cc of Lidocaine 1% injected with 1 mL of Kenalog 40 mg into trochanteric bursa at area of maximal tenderness at greater trochanter. Needle taken to bone to troch bursa, flows easily. Bursa massaged. No bleeding and no complications. Decreased pain after injection. Needle: 22 gauge spinal needle Medication: 1 mL of Kenalog 40 mg   Aspiration/Injection Procedure Note Aaron Mitchell 1946/07/10 Date of procedure: 06/03/2020  Procedure: Large Joint Aspiration / Injection of Hip, Trochanteric  Bursa, L Indications: Pain  Procedure Details Verbal consent obtained. Risks (including infection, potential atrophy), benefits, and alternatives reviewed. Greater trochanter sterilely prepped with Chloraprep. Ethyl Chloride used for anesthesia. 9 cc of Lidocaine 1% injected with 1 mL of Kenalog 40 mg into trochanteric bursa at area of maximal tenderness at greater trochanter. Needle taken to bone to troch bursa, flows easily. Bursa massaged. No bleeding and no complications. Decreased pain after injection. Needle: 22 gauge spinal needle Medication: 1 mL of Kenalog 40 mg   Signed,  Fayth Trefry T. Hymie Gorr, MD

## 2020-06-03 ENCOUNTER — Encounter: Payer: Self-pay | Admitting: Family Medicine

## 2020-06-03 ENCOUNTER — Other Ambulatory Visit: Payer: Self-pay

## 2020-06-03 ENCOUNTER — Ambulatory Visit (INDEPENDENT_AMBULATORY_CARE_PROVIDER_SITE_OTHER): Payer: PPO | Admitting: Family Medicine

## 2020-06-03 ENCOUNTER — Ambulatory Visit: Payer: PPO | Admitting: Family Medicine

## 2020-06-03 VITALS — BP 120/70 | HR 78 | Temp 98.5°F | Ht 69.0 in | Wt 261.8 lb

## 2020-06-03 DIAGNOSIS — M7062 Trochanteric bursitis, left hip: Secondary | ICD-10-CM | POA: Diagnosis not present

## 2020-06-03 DIAGNOSIS — M7061 Trochanteric bursitis, right hip: Secondary | ICD-10-CM

## 2020-06-03 MED ORDER — TRIAMCINOLONE ACETONIDE 40 MG/ML IJ SUSP
40.0000 mg | Freq: Once | INTRAMUSCULAR | Status: AC
  Administered 2020-06-03: 40 mg via INTRA_ARTICULAR

## 2020-06-03 NOTE — Progress Notes (Signed)
    Dalynn Jhaveri T. Mitzi Lilja, MD, [place] at [facility] [place], [address]  Phone: [phone]  FAX: [phone]  Nachmen Mansel - 74 y.o. male  MRN [id number]  Date of Birth: 1946-05-17  Date: 06/03/2020  PCP: [name], MD  Referral: [name], MD  Chief Complaint  Patient presents with  . Hip Pain    Bilateral-Left is worse    This visit occurred during the SARS-CoV-2 public health emergency.  Safety protocols were in place, including screening questions prior to the visit, additional usage of staff PPE, and extensive cleaning of exam room while observing appropriate contact time as indicated for disinfecting solutions.    Aspiration/Injection Procedure Note Dequavion Follette 1945-08-13 Date of procedure: 06/03/2020  Procedure: Large Joint Aspiration / Injection of Hip, Trochanteric Bursa, L Indications: Pain  Procedure Details Verbal consent obtained. Risks (including infection, potential atrophy), benefits, and alternatives reviewed. Greater trochanter sterilely prepped with Chloraprep. Ethyl Chloride used for anesthesia. 9 cc of Lidocaine 1% injected with 1 mL of Kenalog 40 mg into trochanteric bursa at area of maximal tenderness at greater trochanter. Needle taken to bone to troch bursa, flows easily. Bursa massaged. No bleeding and no complications. Decreased pain after injection. Needle: 22 gauge spinal needle Medication: 1 mL of Kenalog 40 mg   Aspiration/Injection Procedure Note Daishawn Lauf Apr 13, 1946 Date of procedure: 06/03/2020  Signed,  Frederico Hamman T. Althia Egolf, MD     ICD-10-CM   1. Trochanteric bursitis of left hip  M70.62 triamcinolone acetonide (KENALOG-40) injection 40 mg    Meds ordered this encounter  Medications  . triamcinolone acetonide (KENALOG-40) injection 40 mg

## 2020-06-12 ENCOUNTER — Telehealth: Payer: Self-pay

## 2020-06-12 NOTE — Progress Notes (Signed)
Chronic Care Management Pharmacy Assistant   Name: Aaron Mitchell  MRN: [id number] DOB: [birth year]  Reason for Encounter: Medication Review / Monthly Dispensing Call  PCP : [name], MD  Allergies:   Allergies  Allergen Reactions  . Codeine Other (See Comments)    Other reaction(s): Other (See Comments) HEADACHES Causes headaches when he stops liquid Codeine-based products  . Ace Inhibitors Cough    Cough also present with ARB (losartan)    Medications: Outpatient Encounter Medications as of 06/12/2020  Medication Sig  . albuterol (PROVENTIL) (2.5 MG/3ML) 0.083% nebulizer solution Take 3 mLs (2.5 mg total) by nebulization every 6 (six) hours as needed for wheezing or shortness of breath.  [name] Kitchen albuterol (VENTOLIN HFA) 108 (90 Base) MCG/ACT inhaler Inhale 2 puffs into the lungs every 6 (six) hours as needed for wheezing or shortness of breath.  . allopurinol (ZYLOPRIM) 300 MG tablet TAKE 1 TABLET BY MOUTH DAILY  . amLODipine (NORVASC) 5 MG tablet Take 1 tablet (5 mg total) by mouth daily.  [name] Kitchen atorvastatin (LIPITOR) 40 MG tablet TAKE 1 TABLET(40 MG) BY MOUTH DAILY  . diclofenac (VOLTAREN) 75 MG EC tablet Take 1 tablet (75 mg total) by mouth 2 (two) times daily.  . diclofenac sodium (VOLTAREN) 1 % GEL Apply 2 g topically daily as needed. Apply to both hands.  . fluticasone (FLONASE) 50 MCG/ACT nasal spray SHAKE LIQUID AND USE 2 SPRAYS IN EACH NOSTRIL DAILY  . metaxalone (SKELAXIN) 800 MG tablet Take 1 tablet (800 mg total) by mouth 3 (three) times daily as needed for muscle spasms.  . Multiple Vitamins tablet Take 1 tablet by mouth daily.  . naproxen sodium (ANAPROX) 220 MG tablet Take 220 mg by mouth daily.   . sildenafil (VIAGRA) 100 MG tablet Take by mouth.  . tadalafil (CIALIS) 20 MG tablet Take 20 mg by mouth daily as needed for erectile dysfunction.  [name] Kitchen testosterone cypionate (DEPOTESTOSTERONE CYPIONATE) 200 MG/ML injection Inject 0.5 mLs (100 mg total) into the muscle  once a week.  . tiotropium (SPIRIVA HANDIHALER) 18 MCG inhalation capsule Place 1 capsule (18 mcg total) into inhaler and inhale daily.  . traMADol (ULTRAM) 50 MG tablet Take 1 tablet (50 mg total) by mouth every 12 (twelve) hours as needed for moderate pain.  [name] Kitchen triamterene-hydrochlorothiazide (MAXZIDE-25) 37.5-25 MG tablet Take 1 tablet by mouth daily.  [name] Kitchen zolpidem (AMBIEN) 10 MG tablet TAKE 1 TABLET(10 MG) BY MOUTH AT BEDTIME AS NEEDED FOR SLEEP   Facility-Administered Encounter Medications as of 06/12/2020  Medication  . methylPREDNISolone acetate (DEPO-MEDROL) injection 80 mg    Current Diagnosis: Patient Active Problem List   Diagnosis Date Noted  . Right facial pain 06/01/2019  . Sore throat 03/07/2019  . Cough due to ACE inhibitor 06/15/2018  . Bilateral hip pain 06/09/2018  . Background diabetic retinopathy ([place]) 08/17/2017  . Greater trochanteric bursitis of right hip 07/30/2017  . Health maintenance examination 07/14/2017  . Advanced care planning/counseling discussion 07/14/2017  . Stage 3 severe COPD by GOLD classification ([place]) 03/31/2017  . Chest congestion 12/08/2016  . Moderate mitral insufficiency 09/14/2016  . Male hypogonadism   . Insomnia 05/12/2016  . OSA (obstructive sleep apnea) 05/12/2016  . Chronic back pain   . Degenerative disc disease, lumbar   . Hyperlipidemia associated with type 2 diabetes mellitus ([place]) 09/17/2015  . ED (erectile dysfunction) of organic origin 03/25/2015  . CMC arthritis, thumb, degenerative 01/19/2013  . Rotator cuff impingement syndrome 10/20/2011  . Chronic gouty arthropathy  04/02/2011  . Severe obesity (BMI 35.0-39.9) with comorbidity ([place]) 04/02/2011  . Osteoarthritis 04/02/2011  . Allergic rhinitis, seasonal 04/02/2011  . Chronic shoulder pain 04/02/2011  . Prediabetes 03/10/2011  . Essential (primary) hypertension 03/10/2011    Follow-Up:  Pharmacist Review   Reviewed chart for medication changes ahead of medication  coordination call.  OVs, Consults, or hospital visits since last care coordination call/Pharmacist visit.   06/03/2020 [facility] [name] Frederico Hamman  No medication changes indicated   BP Readings from Last 3 Encounters:  06/03/20 120/70  04/22/20 140/66  09/27/19 (!) 158/90    Lab Results  Component Value Date   HGBA1C 6.4 06/01/2019     Patient obtains medications through Adherence Packaging  90 Days   Last adherence delivery included: (medication name and frequency) Amlodipine5 mgone tabletdaily Atorvastatin40 mg 1 tabletdaily Triamterene 37.5 mg-HCTZ25 mg 1 tabletDaily Allopurinol300 mg 1 tabletdaily Diclofenac75 mg 1 tablet twice daily Zolpidem10 mg- 1 tablet at bedtime as needed Testosterone cypionate200 mg/ml- Inject 0.5 ml IM once weekly [id number] mcg- Place 1 capsule into inhaler and inhale daily.  Patient declined (meds) last month due to PRN use/additional supply on hand. Metaxalone 800 mg due PRN use Tramadol 50 mg due to PRN use  Flonase due to PRN use  Patient is due for next adherence delivery on: No fill Needed. Called patient and reviewed medications and coordinated delivery.  This delivery to include: None ID  Patient declined the following medications (meds) due to (reason) Amlodipine5 mgone tabletdaily Atorvastatin40 mg 1 tabletdaily Triamterene 37.5 mg-HCTZ25 mg 1 tabletDaily Allopurinol300 mg 1 tabletdaily Diclofenac75 mg 1 tablet twice daily Zolpidem10 mg- 1 tablet at bedtime as needed Testosterone cypionate200 mg/ml- Inject 0.5 ml IM once weekly [id number] mcg- Place 1 capsule into inhaler and inhale daily. Metaxalone 800 mg due PRN use Tramadol 50 mg due to PRN use  Flonase due to PRN use  Patient needs refills for None ID .  Confirmed delivery date of No Fill Needed , advised patient that pharmacy will contact them the morning of delivery.  [place] Pharmacist Assistant [phone]

## 2020-06-28 ENCOUNTER — Telehealth: Payer: Self-pay

## 2020-06-28 NOTE — Telephone Encounter (Signed)
[place] Night - Client Nonclinical Telephone Record AccessNurse Client [place] Night - Client Client Site [place] Physician Owens Loffler - MD Contact Type Call Who Is Calling Patient / Member / Family / Caregiver Caller Name Tremon Sainvil Phone Number [phone] Patient Name Aaron Mitchell Patient DOB Dec 01, 1945 Call Type Message Only Information Provided Reason for Call Request to Schedule Office Appointment Initial Comment Caller states, needs an appt. for a shot left shoulder. Disp. Time Disposition Final User 06/28/2020 7:16:14 AM General Information Provided Yes Jobie Quaker Call Closed By: Jobie Quaker Transaction Date/Time: 06/28/2020 7:14:08 AM (ET)

## 2020-06-28 NOTE — Telephone Encounter (Signed)
Spoke with patient said will call back to make appt he's going out of town

## 2020-07-10 ENCOUNTER — Other Ambulatory Visit: Payer: Self-pay

## 2020-07-10 ENCOUNTER — Ambulatory Visit (INDEPENDENT_AMBULATORY_CARE_PROVIDER_SITE_OTHER)
Admission: RE | Admit: 2020-07-10 | Discharge: 2020-07-10 | Disposition: A | Discharge status: Home / Self Care | Payer: PPO | Source: Ambulatory Visit | Attending: Family Medicine | Admitting: Family Medicine

## 2020-07-10 ENCOUNTER — Encounter: Payer: Self-pay | Admitting: Family Medicine

## 2020-07-10 ENCOUNTER — Ambulatory Visit (INDEPENDENT_AMBULATORY_CARE_PROVIDER_SITE_OTHER): Payer: PPO | Admitting: Family Medicine

## 2020-07-10 VITALS — BP 162/82 | HR 71 | Temp 98.3°F | Ht 69.0 in | Wt 263.4 lb

## 2020-07-10 DIAGNOSIS — M2391 Unspecified internal derangement of right knee: Secondary | ICD-10-CM

## 2020-07-10 DIAGNOSIS — M25561 Pain in right knee: Secondary | ICD-10-CM | POA: Diagnosis not present

## 2020-07-10 DIAGNOSIS — M1711 Unilateral primary osteoarthritis, right knee: Secondary | ICD-10-CM

## 2020-07-10 NOTE — Progress Notes (Signed)
Nimrod Wendt T. Courtnee Myer, MD, [place] at [facility] [place], [address]  Phone: [phone]   FAX: [phone]  Aaron Mitchell - 74 y.o. male   MRN [id number]   Date of Birth: 15-Nov-1945  Date: 07/10/2020   PCP: [name], MD   Referral: [name], MD  Chief Complaint  Patient presents with   Knee Pain    R Knee "popped" on Monday.     This visit occurred during the SARS-CoV-2 public health emergency.  Safety protocols were in place, including screening questions prior to the visit, additional usage of staff PPE, and extensive cleaning of exam room while observing appropriate contact time as indicated for disinfecting solutions.   Subjective:   Aaron Mitchell is a 74 y.o. very pleasant male patient with Body mass index is 38.9 kg/m. who presents with the following:  R knee was clicking some and then on Monday put his weight on his right foot and something in the knee went pow.  Almost collapsed.  Since Monday it started to feel better after a few days, but he still has a very large effusion.  Is still having some pain on the medial joint line.  He did have quite a bit of pain when he did a twist in his knee.  He did feel as if he felt a pop and it improved feeling in the knee relatively acutely at that time.  Yesterday was better.  Yesterday when he got up - did a little bit of a twist. Not a immediate swelling.   He has no significant B history on that side.  He does have some known mild degenerative changes as well as chondrocalcinosis without CPPD.  Review of Systems is noted in the HPI, as appropriate   Objective:   BP (!) 162/82 (BP Location: Left Arm, Patient Position: Sitting)    Pulse 71    Temp 98.3 F (36.8 C)    Ht 5\' 9"  (1.753 m)    Wt 263 lb 6.4 oz (119.5 kg)    SpO2 96%    BMI 38.90 kg/m   Right knee: Full extension and flexion to 95.  Large ballotable  effusion.  No pain with loading the medial lateral patellar facets.  He does have some mild medial greater than lateral joint line tenderness.  No significant tenderness at the patella or quad tendons or at the tibial plateau or the proximal fibula.  Any form of forced flexion causes significant pain.  Extension causes no significant pain  Radiology: DG Knee Complete 4 Views Right  Result Date: 07/10/2020 CLINICAL DATA:  Acute right knee pain with loud pop. EXAM: RIGHT KNEE - COMPLETE 4+ VIEW COMPARISON:  X-ray bilateral knees 04/22/2020 FINDINGS: Redemonstration of bilateral knee chondrocalcinosis with associated bilateral tricompartmental mild degenerative changes. No evidence of fracture or dislocation of the right knee. Small right knee joint effusion. No evidence of arthropathy or other focal bone abnormality. Soft tissues are unremarkable. Vascular calcifications. IMPRESSION: 1. No acute displaced fracture or dislocation of the bones of the right knee. 2. Chondrocalcinosis with mild tricompartmental degenerative changes. Electronically Signed   By: Iven Finn M.D.   On: 07/10/2020 16:42     Assessment and Plan:     ICD-10-CM   1. Internal derangement of right knee  M23.91   2. Acute pain of right knee  M25.561 DG Knee Complete 4 Views Right  3. Primary  osteoarthritis of right knee  M17.11    By history most likely internal derangement.  Certainly could have a meniscal tear that had a flap of tissue in the joint as well as possibly loose body.  Nevertheless, he is feeling better after short amount of time.  For now I would ice his knee multiple times throughout his day, as well as work on his range of motion mobilize the effusion.  Do some Tylenol and NSAIDs.  If he is still having some issues in the month in follow-up.  Orders Placed This Encounter  Procedures   DG Knee Complete 4 Views Right    Follow-up: No follow-ups on file.  Signed,  Maud Deed. Haru Anspaugh,  MD   Outpatient Encounter Medications as of 07/10/2020  Medication Sig   albuterol (PROVENTIL) (2.5 MG/3ML) 0.083% nebulizer solution Take 3 mLs (2.5 mg total) by nebulization every 6 (six) hours as needed for wheezing or shortness of breath.   albuterol (VENTOLIN HFA) 108 (90 Base) MCG/ACT inhaler Inhale 2 puffs into the lungs every 6 (six) hours as needed for wheezing or shortness of breath.   allopurinol (ZYLOPRIM) 300 MG tablet TAKE 1 TABLET BY MOUTH DAILY   amLODipine (NORVASC) 5 MG tablet Take 1 tablet (5 mg total) by mouth daily.   atorvastatin (LIPITOR) 40 MG tablet TAKE 1 TABLET(40 MG) BY MOUTH DAILY   diclofenac (VOLTAREN) 75 MG EC tablet Take 1 tablet (75 mg total) by mouth 2 (two) times daily.   diclofenac sodium (VOLTAREN) 1 % GEL Apply 2 g topically daily as needed. Apply to both hands.   fluticasone (FLONASE) 50 MCG/ACT nasal spray SHAKE LIQUID AND USE 2 SPRAYS IN EACH NOSTRIL DAILY   metaxalone (SKELAXIN) 800 MG tablet Take 1 tablet (800 mg total) by mouth 3 (three) times daily as needed for muscle spasms.   Multiple Vitamins tablet Take 1 tablet by mouth daily.   naproxen sodium (ANAPROX) 220 MG tablet Take 220 mg by mouth daily.    sildenafil (VIAGRA) 100 MG tablet Take by mouth.   tadalafil (CIALIS) 20 MG tablet Take 20 mg by mouth daily as needed for erectile dysfunction.   testosterone cypionate (DEPOTESTOSTERONE CYPIONATE) 200 MG/ML injection Inject 0.5 mLs (100 mg total) into the muscle once a week.   tiotropium (SPIRIVA HANDIHALER) 18 MCG inhalation capsule Place 1 capsule (18 mcg total) into inhaler and inhale daily.   traMADol (ULTRAM) 50 MG tablet Take 1 tablet (50 mg total) by mouth every 12 (twelve) hours as needed for moderate pain.   triamterene-hydrochlorothiazide (MAXZIDE-25) 37.5-25 MG tablet Take 1 tablet by mouth daily.   zolpidem (AMBIEN) 10 MG tablet TAKE 1 TABLET(10 MG) BY MOUTH AT BEDTIME AS NEEDED FOR SLEEP   Facility-Administered  Encounter Medications as of 07/10/2020  Medication   methylPREDNISolone acetate (DEPO-MEDROL) injection 80 mg

## 2020-07-11 ENCOUNTER — Telehealth: Payer: Self-pay

## 2020-07-11 NOTE — Chronic Care Management (AMB) (Signed)
Chronic Care Management Pharmacy Assistant   Name: Aaron Mitchell  MRN: [id number] DOB: 1946/03/08  Reason for Encounter: Medication Review  Patient Questions:  1.  Have you seen any other providers since your last visit? Yes, 07/10/20 Dr. Frederico Hamman [name] 06/03/20- Dr. Frederico Hamman [name]   2.  Any changes in your medicines or health? No     PCP : [name], MD  Allergies:   Allergies  Allergen Reactions  . Codeine Other (See Comments)    Other reaction(s): Other (See Comments) HEADACHES Causes headaches when he stops liquid Codeine-based products  . Ace Inhibitors Cough    Cough also present with ARB (losartan)    Medications: Outpatient Encounter Medications as of 07/11/2020  Medication Sig  . albuterol (PROVENTIL) (2.5 MG/3ML) 0.083% nebulizer solution Take 3 mLs (2.5 mg total) by nebulization every 6 (six) hours as needed for wheezing or shortness of breath.  [name] Kitchen albuterol (VENTOLIN HFA) 108 (90 Base) MCG/ACT inhaler Inhale 2 puffs into the lungs every 6 (six) hours as needed for wheezing or shortness of breath.  . allopurinol (ZYLOPRIM) 300 MG tablet TAKE 1 TABLET BY MOUTH DAILY  . amLODipine (NORVASC) 5 MG tablet Take 1 tablet (5 mg total) by mouth daily.  [name] Kitchen atorvastatin (LIPITOR) 40 MG tablet TAKE 1 TABLET(40 MG) BY MOUTH DAILY  . diclofenac (VOLTAREN) 75 MG EC tablet Take 1 tablet (75 mg total) by mouth 2 (two) times daily.  . diclofenac sodium (VOLTAREN) 1 % GEL Apply 2 g topically daily as needed. Apply to both hands.  . fluticasone (FLONASE) 50 MCG/ACT nasal spray SHAKE LIQUID AND USE 2 SPRAYS IN EACH NOSTRIL DAILY  . metaxalone (SKELAXIN) 800 MG tablet Take 1 tablet (800 mg total) by mouth 3 (three) times daily as needed for muscle spasms.  . Multiple Vitamins tablet Take 1 tablet by mouth daily.  . naproxen sodium (ANAPROX) 220 MG tablet Take 220 mg by mouth daily.   . sildenafil (VIAGRA) 100 MG tablet Take by mouth.  .  tadalafil (CIALIS) 20 MG tablet Take 20 mg by mouth daily as needed for erectile dysfunction.  [name] Kitchen testosterone cypionate (DEPOTESTOSTERONE CYPIONATE) 200 MG/ML injection Inject 0.5 mLs (100 mg total) into the muscle once a week.  . tiotropium (SPIRIVA HANDIHALER) 18 MCG inhalation capsule Place 1 capsule (18 mcg total) into inhaler and inhale daily.  . traMADol (ULTRAM) 50 MG tablet Take 1 tablet (50 mg total) by mouth every 12 (twelve) hours as needed for moderate pain.  [name] Kitchen triamterene-hydrochlorothiazide (MAXZIDE-25) 37.5-25 MG tablet Take 1 tablet by mouth daily.  [name] Kitchen zolpidem (AMBIEN) 10 MG tablet TAKE 1 TABLET(10 MG) BY MOUTH AT BEDTIME AS NEEDED FOR SLEEP   Facility-Administered Encounter Medications as of 07/11/2020  Medication  . methylPREDNISolone acetate (DEPO-MEDROL) injection 80 mg    Current Diagnosis: Patient Active Problem List   Diagnosis Date Noted  . Right facial pain 06/01/2019  . Sore throat 03/07/2019  . Cough due to ACE inhibitor 06/15/2018  . Bilateral hip pain 06/09/2018  . Background diabetic retinopathy ([place]) 08/17/2017  . Greater trochanteric bursitis of right hip 07/30/2017  . Health maintenance examination 07/14/2017  . Advanced care planning/counseling discussion 07/14/2017  . Stage 3 severe COPD by GOLD classification ([place]) 03/31/2017  . Chest congestion 12/08/2016  . Moderate mitral insufficiency 09/14/2016  . Male hypogonadism   . Insomnia 05/12/2016  . OSA (obstructive sleep apnea) 05/12/2016  . Chronic back pain   . Degenerative disc  disease, lumbar   . Hyperlipidemia associated with type 2 diabetes mellitus ([place]) 09/17/2015  . ED (erectile dysfunction) of organic origin 03/25/2015  . CMC arthritis, thumb, degenerative 01/19/2013  . Rotator cuff impingement syndrome 10/20/2011  . Chronic gouty arthropathy 04/02/2011  . Severe obesity (BMI 35.0-39.9) with comorbidity ([place]) 04/02/2011  . Osteoarthritis 04/02/2011  . Allergic rhinitis, seasonal  04/02/2011  . Chronic shoulder pain 04/02/2011  . Prediabetes 03/10/2011  . Essential (primary) hypertension 03/10/2011   Reviewed chart for medication changes ahead of medication coordination call.  OVs include 07/10/20 and 06/03/20 with Dr. Frederico Hamman [name]. No consults, or hospital visits since last care coordination call/Pharmacist visit.  No medication changes indicated.  BP Readings from Last 3 Encounters:  07/10/20 (!) 162/82  06/03/20 120/70  04/22/20 140/66    Lab Results  Component Value Date   HGBA1C 6.4 06/01/2019     Patient obtains medications through Adherence Packaging  90 Days   Last adherence delivery included:  NONE  Patient declined the following medications last month  Allopurinol300 mg 1 tabletdaily Amlodipine5 mgone tabletdaily Atorvastatin40 mg 1 tabletdaily Diclofenac75 mg 1 tablet twice daily Flonase due to PRN use Metaxalone 800 mg due PRN use Spiriva18 mcg- Place 1 capsule into inhaler and inhale daily. Testosterone cypionate200 mg/ml- Inject 0.5 ml IM once weekly Tramadol 50 mg due to PRN use  Triamterene 37.5 mg-HCTZ25 mg 1 tabletDaily Zolpidem10 mg- 1 tablet at bedtime as needed  Patient is due for next adherence delivery on: 07/18/20 - No medications needed . Called patient and reviewed medications and coordinated delivery.  This delivery to include: NONE  Patient will not need a short or acute fill of medications, prior to adherence delivery.     Patient declined the following medications : Allopurinol300 mg 1 tabletdaily (breakfast) due to 90 DS filled 05/16/20 Amlodipine5 mgone tabletdaily (breakfast) due to 90 DS filled 05/16/20 Atorvastatin40 mg 1 tabletdaily (breakfast) due to 90 DS filled 05/16/20 Diclofenac75 mg 1 tablet twice daily (PRN - Vials) due to 90 DS filled 05/17/20 Flonase due to PRN use (PRN- Vials) Metaxalone 800 mg due PRN use (PRN - vials) [id number] mcg- Place 1 capsule into inhaler and  inhale daily.  Testosterone cypionate200 mg/ml- Inject 0.5 ml IM once weekly due to 90 DS filled 05/28/20 Tramadol 50 mg 1 tablet every 12 hours  due to PRN use (PRN Vials) Triamterene 37.5 mg-HCTZ25 mg 1 tabletDaily (breakfast) due to 90 DS filled 05/16/20 Zolpidem10 mg- 1 tablet at bedtime as needed (bedtime- PRN- Vials)  Patient does not need refills for any medications.  No medications needed for delivery date of 07/18/20.  Follow-Up:  Comptroller and Pharmacist Review   Debbora Dus, CPP notified  Margaretmary Dys, [place] Pharmacy Assistant [phone]

## 2020-07-12 DIAGNOSIS — M5136 Other intervertebral disc degeneration, lumbar region: Secondary | ICD-10-CM | POA: Diagnosis not present

## 2020-07-12 DIAGNOSIS — M5416 Radiculopathy, lumbar region: Secondary | ICD-10-CM | POA: Diagnosis not present

## 2020-07-12 DIAGNOSIS — M48062 Spinal stenosis, lumbar region with neurogenic claudication: Secondary | ICD-10-CM | POA: Diagnosis not present

## 2020-07-25 ENCOUNTER — Telehealth: Payer: Self-pay

## 2020-07-25 NOTE — Chronic Care Management (AMB) (Addendum)
Chronic Care Management Pharmacy Assistant   Name: Aaron Mitchell  MRN: [id number] DOB: [birth year]  Reason for Encounter: Medication Review- adherence review  Reviewed chart and adherence measures. Per [employer] [facility], medication adherence for cholesterol 90-99% med compliance.  PCP : [name], MD  Allergies:   Allergies  Allergen Reactions   Codeine Other (See Comments)    Other reaction(s): Other (See Comments) HEADACHES Causes headaches when he stops liquid Codeine-based products   Ace Inhibitors Cough    Cough also present with ARB (losartan)    Medications: Outpatient Encounter Medications as of 07/25/2020  Medication Sig   albuterol (PROVENTIL) (2.5 MG/3ML) 0.083% nebulizer solution Take 3 mLs (2.5 mg total) by nebulization every 6 (six) hours as needed for wheezing or shortness of breath.   albuterol (VENTOLIN HFA) 108 (90 Base) MCG/ACT inhaler Inhale 2 puffs into the lungs every 6 (six) hours as needed for wheezing or shortness of breath.   allopurinol (ZYLOPRIM) 300 MG tablet TAKE 1 TABLET BY MOUTH DAILY   amLODipine (NORVASC) 5 MG tablet Take 1 tablet (5 mg total) by mouth daily.   atorvastatin (LIPITOR) 40 MG tablet TAKE 1 TABLET(40 MG) BY MOUTH DAILY   diclofenac (VOLTAREN) 75 MG EC tablet Take 1 tablet (75 mg total) by mouth 2 (two) times daily.   diclofenac sodium (VOLTAREN) 1 % GEL Apply 2 g topically daily as needed. Apply to both hands.   fluticasone (FLONASE) 50 MCG/ACT nasal spray SHAKE LIQUID AND USE 2 SPRAYS IN EACH NOSTRIL DAILY   metaxalone (SKELAXIN) 800 MG tablet Take 1 tablet (800 mg total) by mouth 3 (three) times daily as needed for muscle spasms.   Multiple Vitamins tablet Take 1 tablet by mouth daily.   naproxen sodium (ANAPROX) 220 MG tablet Take 220 mg by mouth daily.    sildenafil (VIAGRA) 100 MG tablet Take by mouth.   tadalafil (CIALIS) 20 MG tablet Take 20 mg by mouth daily as needed for erectile dysfunction.   testosterone  cypionate (DEPOTESTOSTERONE CYPIONATE) 200 MG/ML injection Inject 0.5 mLs (100 mg total) into the muscle once a week.   tiotropium (SPIRIVA HANDIHALER) 18 MCG inhalation capsule Place 1 capsule (18 mcg total) into inhaler and inhale daily.   traMADol (ULTRAM) 50 MG tablet Take 1 tablet (50 mg total) by mouth every 12 (twelve) hours as needed for moderate pain.   triamterene-hydrochlorothiazide (MAXZIDE-25) 37.5-25 MG tablet Take 1 tablet by mouth daily.   zolpidem (AMBIEN) 10 MG tablet TAKE 1 TABLET(10 MG) BY MOUTH AT BEDTIME AS NEEDED FOR SLEEP   Facility-Administered Encounter Medications as of 07/25/2020  Medication   methylPREDNISolone acetate (DEPO-MEDROL) injection 80 mg    Current Diagnosis: Patient Active Problem List   Diagnosis Date Noted   Right facial pain 06/01/2019   Sore throat 03/07/2019   Cough due to ACE inhibitor 06/15/2018   Bilateral hip pain 06/09/2018   Background diabetic retinopathy ([place]) 08/17/2017   Greater trochanteric bursitis of right hip 07/30/2017   Health maintenance examination 07/14/2017   Advanced care planning/counseling discussion 07/14/2017   Stage 3 severe COPD by GOLD classification ([place]) 03/31/2017   Chest congestion 12/08/2016   Moderate mitral insufficiency 09/14/2016   Male hypogonadism    Insomnia 05/12/2016   OSA (obstructive sleep apnea) 05/12/2016   Chronic back pain    Degenerative disc disease, lumbar    Hyperlipidemia associated with type 2 diabetes mellitus ([place]) 09/17/2015   ED (erectile dysfunction) of organic origin 03/25/2015   CMC arthritis, thumb,  degenerative 01/19/2013   Rotator cuff impingement syndrome 10/20/2011   Chronic gouty arthropathy 04/02/2011   Severe obesity (BMI 35.0-39.9) with comorbidity (HCC) 04/02/2011   Osteoarthritis 04/02/2011   Allergic rhinitis, seasonal 04/02/2011   Chronic shoulder pain 04/02/2011   Prediabetes 03/10/2011   Essential (primary) hypertension 03/10/2011    Follow-Up:   Pharmacist Review   Jomarie Longs, [facility] Clinical Pharmacy Assistant [phone]  Reviewed chart and insurance data for medication adherence. Patient does not have any gaps in adherence and is not in danger of failing any Medicare adherence measures. No further action required.  Phil Dopp, PharmD Clinical Pharmacist [facility] at [facility] [phone]

## 2020-08-06 ENCOUNTER — Telehealth: Payer: Self-pay

## 2020-08-06 NOTE — Chronic Care Management (AMB) (Addendum)
Chronic Care Management Pharmacy Assistant   Name: Aaron Mitchell  MRN: [id number] DOB: Apr 13, 1946  Reason for Encounter: Medication Adherence and Delivery Coordination  Patient Questions:  1.  Have you seen any other providers since your last visit? No  2.  Any changes in your medicines or health? No  PCP : [name], MD  Allergies:   Allergies  Allergen Reactions   Codeine Other (See Comments)    Other reaction(s): Other (See Comments) HEADACHES Causes headaches when he stops liquid Codeine-based products   Ace Inhibitors Cough    Cough also present with ARB (losartan)    Medications: Outpatient Encounter Medications as of 08/06/2020  Medication Sig   albuterol (PROVENTIL) (2.5 MG/3ML) 0.083% nebulizer solution Take 3 mLs (2.5 mg total) by nebulization every 6 (six) hours as needed for wheezing or shortness of breath.   albuterol (VENTOLIN HFA) 108 (90 Base) MCG/ACT inhaler Inhale 2 puffs into the lungs every 6 (six) hours as needed for wheezing or shortness of breath.   allopurinol (ZYLOPRIM) 300 MG tablet TAKE 1 TABLET BY MOUTH DAILY   amLODipine (NORVASC) 5 MG tablet Take 1 tablet (5 mg total) by mouth daily.   atorvastatin (LIPITOR) 40 MG tablet TAKE 1 TABLET(40 MG) BY MOUTH DAILY   diclofenac (VOLTAREN) 75 MG EC tablet Take 1 tablet (75 mg total) by mouth 2 (two) times daily.   diclofenac sodium (VOLTAREN) 1 % GEL Apply 2 g topically daily as needed. Apply to both hands.   fluticasone (FLONASE) 50 MCG/ACT nasal spray SHAKE LIQUID AND USE 2 SPRAYS IN EACH NOSTRIL DAILY   metaxalone (SKELAXIN) 800 MG tablet Take 1 tablet (800 mg total) by mouth 3 (three) times daily as needed for muscle spasms.   Multiple Vitamins tablet Take 1 tablet by mouth daily.   naproxen sodium (ANAPROX) 220 MG tablet Take 220 mg by mouth daily.    sildenafil (VIAGRA) 100 MG tablet Take by mouth.   tadalafil (CIALIS) 20 MG tablet Take 20 mg by mouth daily as needed for erectile  dysfunction.   testosterone cypionate (DEPOTESTOSTERONE CYPIONATE) 200 MG/ML injection Inject 0.5 mLs (100 mg total) into the muscle once a week.   tiotropium (SPIRIVA HANDIHALER) 18 MCG inhalation capsule Place 1 capsule (18 mcg total) into inhaler and inhale daily.   traMADol (ULTRAM) 50 MG tablet Take 1 tablet (50 mg total) by mouth every 12 (twelve) hours as needed for moderate pain.   triamterene-hydrochlorothiazide (MAXZIDE-25) 37.5-25 MG tablet Take 1 tablet by mouth daily.   zolpidem (AMBIEN) 10 MG tablet TAKE 1 TABLET(10 MG) BY MOUTH AT BEDTIME AS NEEDED FOR SLEEP   Facility-Administered Encounter Medications as of 08/06/2020  Medication   methylPREDNISolone acetate (DEPO-MEDROL) injection 80 mg    Current Diagnosis: Patient Active Problem List   Diagnosis Date Noted   Right facial pain 06/01/2019   Sore throat 03/07/2019   Cough due to ACE inhibitor 06/15/2018   Bilateral hip pain 06/09/2018   Background diabetic retinopathy ([place]) 08/17/2017   Greater trochanteric bursitis of right hip 07/30/2017   Health maintenance examination 07/14/2017   Advanced care planning/counseling discussion 07/14/2017   Stage 3 severe COPD by GOLD classification ([place]) 03/31/2017   Chest congestion 12/08/2016   Moderate mitral insufficiency 09/14/2016   Male hypogonadism    Insomnia 05/12/2016   OSA (obstructive sleep apnea) 05/12/2016   Chronic back pain    Degenerative disc disease, lumbar    Hyperlipidemia associated with type 2 diabetes mellitus ([place]) 09/17/2015  ED (erectile dysfunction) of organic origin 03/25/2015   CMC arthritis, thumb, degenerative 01/19/2013   Rotator cuff impingement syndrome 10/20/2011   Chronic gouty arthropathy 04/02/2011   Severe obesity (BMI 35.0-39.9) with comorbidity ([place]) 04/02/2011   Osteoarthritis 04/02/2011   Allergic rhinitis, seasonal 04/02/2011   Chronic shoulder pain 04/02/2011   Prediabetes 03/10/2011   Essential (primary) hypertension  03/10/2011    Reviewed chart for medication changes ahead of medication coordination call.  No OVs, Consults, or hospital visits since last care coordination call/Pharmacist visit.   No medication changes indicated.  BP Readings from Last 3 Encounters:  07/10/20 (!) 162/82  06/03/20 120/70  04/22/20 140/66    Lab Results  Component Value Date   HGBA1C 6.4 06/01/2019    Patient obtains medications through Adherence Packaging  90 Days   Last adherence delivery included:  05/16/20 Adherence Packaging 90 DS Allopurinol 300 mg 1 tablet daily (before breakfast) Amlodipine 5 mg 1 tablet daily (before breakfast) Atorvastatin 40 mg 1 tablet daily (before breakfast) Triamterene 37.5 mg-HCTZ 25 mg 1 tablet daily (before breakfast)  Vials: 05/17/20 90 DS - Diclofenac 75 mg 1 tablet twice daily 05/20/20 30 DS - Zolpidem 10 mg- 1 tablet at bedtime as needed 05/28/20 90 DS - Testosterone cypionate 200 mg/ml- Inject 0.5 ml IM once weekly  Patient declined the following medications last month:  Last filled 02/14/20 90 DS - Spiriva 18 mcg- Place 1 capsule into inhaler and inhale daily - patient had excess supply prior to switching to [facility], he is taking as prescribed Last filled 02/14/20 15 DS - Tramadol 50 mg due to PRN use Last filled 02/14/20 90 DS - Flonase due to PRN use  No fill history - Metaxalone 800 mg due PRN use  Patient is due for next adherence delivery on: 08/16/2020 Called patient and reviewed medications and coordinated delivery.  This delivery to include: 08/16/20 Adherence packaging 90 DS Allopurinol 300 mg 1 tablet before breakfast Amlodipine 5 mg one tablet before breakfast Atorvastatin 40 mg 1 tablet before breakfast Triamterene 37.5 mg-HCTZ 25 mg 1 tablet  before breakfast  08/16/20 90 DS Vials/PRN medications: Metaxalone 800 mg - 1 TID PRN Spiriva 18 mcg - Place 1 capsule into inhaler and inhale daily Testosterone cypionate 200 mg/ml- Inject 0.5 ml IM once  weekly  Patient declined the following medications : Diclofenac 75 mg 1 tablet twice daily - patient has about 30 on hand, takes PRN Flonase due to PRN use Tramadol 50 mg due to PRN use  Zolpidem 10 mg due to PRN use  Patient needs refills for Allopurinol, atorvastatin, triamterene-HCTZ - requested from PCP  Confirmed delivery date of 08/16/20, advised patient that pharmacy will contact them the morning of delivery.  Mr. Veiga states he does not check his blood pressure as it normally runs good  around 130s/80s.  Follow-Up:  Coordination of Enhanced Pharmacy Services and Pharmacist Review   Debbora Dus, CPP notified  Margaretmary Dys, [place] Pharmacy Assistant [phone]  I have reviewed the care management and care coordination activities outlined in this encounter and I am certifying that I agree with the content of this note. No further action required.  Debbora Dus, PharmD Clinical Pharmacist [place] [facility] at [facility] [phone]

## 2020-08-07 ENCOUNTER — Telehealth: Payer: Self-pay

## 2020-08-07 MED ORDER — ATORVASTATIN CALCIUM 40 MG PO TABS
40.0000 mg | ORAL_TABLET | Freq: Every day | ORAL | 0 refills | Status: DC
Start: 2020-08-07 — End: 2020-11-12

## 2020-08-07 MED ORDER — TRIAMTERENE-HCTZ 37.5-25 MG PO TABS: 1.0000 | ORAL_TABLET | Freq: Every day | ORAL | 0 refills | Status: DC

## 2020-08-07 MED ORDER — ALLOPURINOL 300 MG PO TABS
300.0000 mg | ORAL_TABLET | Freq: Every day | ORAL | 0 refills | Status: DC
Start: 2020-08-07 — End: 2020-11-12

## 2020-08-07 NOTE — Telephone Encounter (Signed)
E-scribed refills.  Plz schedule wellness, labs and cpe. 

## 2020-08-07 NOTE — Telephone Encounter (Signed)
-----   Message from [place], [facility] sent at 08/07/2020  8:42 AM EST ----- Regarding: Refill Request Refills needed for allopurinol, atorvastatin, and triamterene/HCTZ to [facility]. (90 day supply preferred)  Thank you,  Debbora Dus, PharmD Clinical Pharmacist [place] [facility] at [facility] [phone]

## 2020-08-19 DIAGNOSIS — G4733 Obstructive sleep apnea (adult) (pediatric): Secondary | ICD-10-CM | POA: Diagnosis not present

## 2020-08-20 DIAGNOSIS — Z20822 Contact with and (suspected) exposure to covid-19: Secondary | ICD-10-CM | POA: Diagnosis not present

## 2020-09-02 ENCOUNTER — Telehealth: Payer: Self-pay

## 2020-09-02 NOTE — Chronic Care Management (AMB) (Addendum)
Chronic Care Management Pharmacy Assistant   Name: Aaron Mitchell  MRN: [id number] DOB: 1945-11-24  Reason for Encounter: CCM follow up appointment reminder for 09/04/20  Patient Questions:  1.  Have you seen any other providers since your last visit? Yes 08/24/20 , 08/20/20- [facility] Covid-19 test.    PCP : [name], MD  Allergies:   Allergies  Allergen Reactions   Codeine Other (See Comments)    Other reaction(s): Other (See Comments) HEADACHES Causes headaches when he stops liquid Codeine-based products   Ace Inhibitors Cough    Cough also present with ARB (losartan)    Medications: Outpatient Encounter Medications as of 09/02/2020  Medication Sig   albuterol (PROVENTIL) (2.5 MG/3ML) 0.083% nebulizer solution Take 3 mLs (2.5 mg total) by nebulization every 6 (six) hours as needed for wheezing or shortness of breath.   albuterol (VENTOLIN HFA) 108 (90 Base) MCG/ACT inhaler Inhale 2 puffs into the lungs every 6 (six) hours as needed for wheezing or shortness of breath.   allopurinol (ZYLOPRIM) 300 MG tablet Take 1 tablet (300 mg total) by mouth daily.   amLODipine (NORVASC) 5 MG tablet Take 1 tablet (5 mg total) by mouth daily.   atorvastatin (LIPITOR) 40 MG tablet Take 1 tablet (40 mg total) by mouth daily.   diclofenac (VOLTAREN) 75 MG EC tablet Take 1 tablet (75 mg total) by mouth 2 (two) times daily.   diclofenac sodium (VOLTAREN) 1 % GEL Apply 2 g topically daily as needed. Apply to both hands.   fluticasone (FLONASE) 50 MCG/ACT nasal spray SHAKE LIQUID AND USE 2 SPRAYS IN EACH NOSTRIL DAILY   metaxalone (SKELAXIN) 800 MG tablet Take 1 tablet (800 mg total) by mouth 3 (three) times daily as needed for muscle spasms.   Multiple Vitamins tablet Take 1 tablet by mouth daily.   naproxen sodium (ANAPROX) 220 MG tablet Take 220 mg by mouth daily.    sildenafil (VIAGRA) 100 MG tablet Take by mouth.   tadalafil (CIALIS) 20 MG tablet Take 20 mg by mouth daily as  needed for erectile dysfunction.   testosterone cypionate (DEPOTESTOSTERONE CYPIONATE) 200 MG/ML injection Inject 0.5 mLs (100 mg total) into the muscle once a week.   tiotropium (SPIRIVA HANDIHALER) 18 MCG inhalation capsule Place 1 capsule (18 mcg total) into inhaler and inhale daily.   traMADol (ULTRAM) 50 MG tablet Take 1 tablet (50 mg total) by mouth every 12 (twelve) hours as needed for moderate pain.   triamterene-hydrochlorothiazide (MAXZIDE-25) 37.5-25 MG tablet Take 1 tablet by mouth daily.   zolpidem (AMBIEN) 10 MG tablet TAKE 1 TABLET(10 MG) BY MOUTH AT BEDTIME AS NEEDED FOR SLEEP   Facility-Administered Encounter Medications as of 09/02/2020  Medication   methylPREDNISolone acetate (DEPO-MEDROL) injection 80 mg    Current Diagnosis: Patient Active Problem List   Diagnosis Date Noted   Right facial pain 06/01/2019   Sore throat 03/07/2019   Cough due to ACE inhibitor 06/15/2018   Bilateral hip pain 06/09/2018   Background diabetic retinopathy ([place]) 08/17/2017   Greater trochanteric bursitis of right hip 07/30/2017   Health maintenance examination 07/14/2017   Advanced care planning/counseling discussion 07/14/2017   Stage 3 severe COPD by GOLD classification ([place]) 03/31/2017   Chest congestion 12/08/2016   Moderate mitral insufficiency 09/14/2016   Male hypogonadism    Insomnia 05/12/2016   OSA (obstructive sleep apnea) 05/12/2016   Chronic back pain    Degenerative disc disease, lumbar    Hyperlipidemia associated with type 2  diabetes mellitus ([place]) 09/17/2015   ED (erectile dysfunction) of organic origin 03/25/2015   CMC arthritis, thumb, degenerative 01/19/2013   Rotator cuff impingement syndrome 10/20/2011   Chronic gouty arthropathy 04/02/2011   Severe obesity (BMI 35.0-39.9) with comorbidity ([employer]) 04/02/2011   Osteoarthritis 04/02/2011   Allergic rhinitis, seasonal 04/02/2011   Chronic shoulder pain 04/02/2011   Prediabetes 03/10/2011   Essential (primary)  hypertension 03/10/2011   Called 09/02/20 - unable to leave voicemail.   Contacted Arav Stitzer ahead of their visit with CPP, Debbora Dus, Pharm. D to review any health or medication changes. Patient denied any changes to health or medications.   Are you having any problems with your medications? Not at this time  Any concerns he would like to discuss with the pharmacist? Not at this time.   Patient reminded to have all medications, supplements and any blood sugar and blood pressure readings available for review with Debbora Dus, Pharm. D, at their telephone visit on 09/04/20 at 2:00 PM .     Follow-Up:  Pharmacist Review  Debbora Dus, CPP notified  Margaretmary Dys, [place] Assistant [phone]  I have reviewed the care management and care coordination activities outlined in this encounter and I am certifying that I agree with the content of this note. No further action required.  Debbora Dus, PharmD Clinical Pharmacist [place] [facility] at [facility] [phone]

## 2020-09-04 ENCOUNTER — Other Ambulatory Visit: Payer: Self-pay

## 2020-09-04 ENCOUNTER — Ambulatory Visit (INDEPENDENT_AMBULATORY_CARE_PROVIDER_SITE_OTHER): Payer: PPO

## 2020-09-04 DIAGNOSIS — I10 Essential (primary) hypertension: Secondary | ICD-10-CM | POA: Diagnosis not present

## 2020-09-04 DIAGNOSIS — J449 Chronic obstructive pulmonary disease, unspecified: Secondary | ICD-10-CM

## 2020-09-04 NOTE — Progress Notes (Unsigned)
Chronic Care Management Pharmacy Note  09/12/2020 Name:  Aaron Mitchell MRN:  [id number] DOB:  1945/10/29  Subjective: Aaron Mitchell is an 75 y.o. year old male who is a primary patient of [name], MD.  The CCM team was consulted for assistance with disease management and care coordination needs.    Engaged with patient by telephone for follow up visit in response to provider referral for pharmacy case management and/or care coordination services.   Consent to Services:  The patient was given information about Chronic Care Management services, agreed to services, and gave verbal consent prior to initiation of services.  Please see initial visit note for detailed documentation.   Patient Care Team: [name], MD as PCP - General (Family Medicine) Emmaline Kluver., MD (Rheumatology) Leandrew Koyanagi, MD as Referring Physician (Ophthalmology) Beverly Gust, MD (Otolaryngology) Debbora Dus, [facility] as Pharmacist (Pharmacist)  Recent office visits: 07/10/20 - [name] - Internal derangement of right knee, For now I would ice his knee multiple times throughout his day, as well as work on his range of motion mobilize the effusion.  Do some Tylenol and NSAIDs. 06/03/20 - [name] - Kenalog injection to left hip  Recent consult visits:  04/15/20 - Diabetic eye exam  Hospital visits: None in previous 6 months  Objective:  Lab Results  Component Value Date   CREATININE 1.18 07/27/2019   BUN 18 07/27/2019   GFR 60.80 07/27/2019   GFRNONAA 51 (L) 12/30/2015   GFRAA 60 (L) 12/30/2015   NA 138 07/27/2019   K 3.9 07/27/2019   CALCIUM 9.2 07/27/2019   CO2 31 07/27/2019    Lab Results  Component Value Date/Time   HGBA1C 6.4 06/01/2019 09:47 AM   HGBA1C 6.3 07/05/2018 10:07 AM   GFR 60.80 07/27/2019 08:46 AM   GFR 52.07 (L) 06/01/2019 09:47 AM   MICROALBUR 12.0 (H) 07/27/2019 08:46 AM    Last diabetic Eye exam:  Lab Results  Component Value Date/Time    HMDIABEYEEXA Retinopathy (A) 04/15/2020 12:00 AM    Last diabetic Foot exam: Unknown  Lab Results  Component Value Date   CHOL 111 07/27/2019   HDL 47.20 07/27/2019   LDLCALC 37 07/27/2019   LDLDIRECT 108.0 09/14/2016   TRIG 133.0 07/27/2019   CHOLHDL 2 07/27/2019    Hepatic Function Latest Ref Rng & Units 07/27/2019 07/05/2018 01/13/2018  Total Protein 6.0 - 8.3 g/dL 6.6 6.6 6.6  Albumin 3.5 - 5.2 g/dL 4.2 4.2 4.2  AST 0 - 37 U/L 26 33 24  ALT 0 - 53 U/L 32 41 34  Alk Phosphatase 39 - 117 U/L 52 50 53  Total Bilirubin 0.2 - 1.2 mg/dL 1.1 0.8 0.8    Lab Results  Component Value Date/Time   TSH 1.29 12/08/2016 12:18 PM    CBC Latest Ref Rng & Units 09/20/2019 06/01/2019 03/08/2019  WBC 4.0 - 10.5 K/uL - 6.0 -  Hemoglobin 13.0 - 17.7 g/dL 17.9(H) 17.3(H) -  Hematocrit 37.5 - 51.0 % 51.6(H) 51.1 50.8  Platelets 150.0 - 400.0 K/uL - 155.0 -   No results found for: VD25OH  Clinical ASCVD: No  The ASCVD Risk score Mikey Bussing DC Jr., et al., 2013) failed to calculate for the following reasons:   The valid total cholesterol range is 130 to 320 mg/dL    Depression screen [facility] 2/9 07/25/2019 07/05/2018 07/01/2017  Decreased Interest 0 0 0  Down, Depressed, Hopeless 0 0 0  PHQ - 2 Score 0 0 0  Altered  sleeping 0 0 0  Tired, decreased energy 0 0 0  Change in appetite 0 0 0  Feeling bad or failure about yourself  0 0 0  Trouble concentrating 0 0 0  Moving slowly or fidgety/restless 0 0 0  Suicidal thoughts 0 0 0  PHQ-9 Score 0 0 0  Difficult doing work/chores Not difficult at all Not difficult at all Not difficult at all    Social History   Tobacco Use  Smoking Status Former Smoker  . Quit date: 07/27/1978  . Years since quitting: 42.1  Smokeless Tobacco Never Used  Tobacco Comment   Quit in 1980   BP Readings from Last 3 Encounters:  07/10/20 (!) 162/82  06/03/20 120/70  04/22/20 140/66   Pulse Readings from Last 3 Encounters:  07/10/20 71  06/03/20 78  04/22/20 72    Wt Readings from Last 3 Encounters:  07/10/20 263 lb 6.4 oz (119.5 kg)  06/03/20 261 lb 12 oz (118.7 kg)  04/22/20 280 lb (127 kg)    Assessment/Interventions: Review of patient past medical history, allergies, medications, health status, including review of consultants reports, laboratory and other test data, was performed as part of comprehensive evaluation and provision of chronic care management services.   SDOH:  (Social Determinants of Health) assessments and interventions performed: Yes SDOH Interventions   Flowsheet Row Most Recent Value  SDOH Interventions   Financial Strain Interventions Intervention Not Indicated  [Medications affordable]      CCM Care Plan  Allergies  Allergen Reactions  . Codeine Other (See Comments)    Other reaction(s): Other (See Comments) HEADACHES Causes headaches when he stops liquid Codeine-based products  . Ace Inhibitors Cough    Cough also present with ARB (losartan)    Medications Reviewed Today    Reviewed by Owens Loffler, MD (Physician) on 07/10/20 at 1127  Med List Status: <None>  Medication Order Taking? Sig Documenting Provider Last Dose Status Informant  albuterol (PROVENTIL) (2.5 MG/3ML) 0.083% nebulizer solution [id number] Yes Take 3 mLs (2.5 mg total) by nebulization every 6 (six) hours as needed for wheezing or shortness of breath. [name], MD Taking Active   albuterol (VENTOLIN HFA) [phone] Base) MCG/ACT inhaler [id number] Yes Inhale 2 puffs into the lungs every 6 (six) hours as needed for wheezing or shortness of breath. [name], MD Taking Active   allopurinol (ZYLOPRIM) 300 MG tablet [id number] Yes TAKE 1 TABLET BY MOUTH DAILY [name], MD Taking Active   amLODipine (NORVASC) 5 MG tablet [id number] Yes Take 1 tablet (5 mg total) by mouth daily. [name], MD Taking Active   atorvastatin (LIPITOR) 40 MG tablet [id number] Yes TAKE 1 TABLET(40 MG) BY MOUTH DAILY [name], MD Taking  Active   diclofenac (VOLTAREN) 75 MG EC tablet [id number] Yes Take 1 tablet (75 mg total) by mouth 2 (two) times daily. [name], MD Taking Active   diclofenac sodium (VOLTAREN) 1 % GEL [id number] Yes Apply 2 g topically daily as needed. Apply to both hands. [provider] Taking Active Multiple Informants           Med Note Nicholes Mango   Sat Dec 28, 2015  3:46 PM)    fluticasone (FLONASE) 50 MCG/ACT nasal spray [id number] Yes SHAKE LIQUID AND USE 2 SPRAYS IN EACH NOSTRIL DAILY [name], MD Taking Active   metaxalone [facility]) 800 MG tablet [id number] Yes Take 1 tablet (800 mg total) by mouth 3 (three) times daily as needed for muscle spasms.  [name], MD Taking Active   methylPREDNISolone acetate (DEPO-MEDROL) injection 80 mg [id number]   Owens Loffler, MD  Active   Multiple Vitamins tablet [id number] Yes Take 1 tablet by mouth daily. [provider] Taking Active Multiple Informants           Med Note Nicholes Mango   Sat Dec 28, 2015  3:46 PM)    naproxen sodium (ANAPROX) 220 MG tablet [id number] Yes Take 220 mg by mouth daily.  [name], MD Taking Active            Med Note Delsa Sale, [place] Sep 27, 2019 11:11 AM)    sildenafil (VIAGRA) 100 MG tablet [id number] Yes Take by mouth. [provider] Taking Active   tadalafil (CIALIS) 20 MG tablet [id number] Yes Take 20 mg by mouth daily as needed for erectile dysfunction. [provider] Taking Active   testosterone cypionate (DEPOTESTOSTERONE CYPIONATE) 200 MG/ML injection [id number] Yes Inject 0.5 mLs (100 mg total) into the muscle once a week. Festus Aloe, MD Taking Active   tiotropium [facility] HANDIHALER) 18 MCG inhalation capsule [id number] Yes Place 1 capsule (18 mcg total) into inhaler and inhale daily. [name], MD Taking Active   traMADol Veat[facility]bon) 50 MG tablet [id number] Yes Take 1 tablet (50 mg total) by mouth every 12 (twelve) hours as needed for  moderate pain. [name], MD Taking Active   triamterene-hydrochlorothiazide [facility]) 37.5-25 MG tablet [id number] Yes Take 1 tablet by mouth daily. [name], MD Taking Active   zolpidem (AMBIEN) 10 MG tablet [id number] Yes TAKE 1 TABLET(10 MG) BY MOUTH AT BEDTIME AS NEEDED FOR SLEEP [name], MD Taking Active           Patient Active Problem List   Diagnosis Date Noted  . Right facial pain 06/01/2019  . Sore throat 03/07/2019  . Cough due to ACE inhibitor 06/15/2018  . Bilateral hip pain 06/09/2018  . Background diabetic retinopathy ([place]) 08/17/2017  . Greater trochanteric bursitis of right hip 07/30/2017  . Health maintenance examination 07/14/2017  . Advanced care planning/counseling discussion 07/14/2017  . Stage 3 severe COPD by GOLD classification ([place]) 03/31/2017  . Chest congestion 12/08/2016  . Moderate mitral insufficiency 09/14/2016  . Male hypogonadism   . Insomnia 05/12/2016  . OSA (obstructive sleep apnea) 05/12/2016  . Chronic back pain   . Degenerative disc disease, lumbar   . Hyperlipidemia associated with type 2 diabetes mellitus ([place]) 09/17/2015  . ED (erectile dysfunction) of organic origin 03/25/2015  . CMC arthritis, thumb, degenerative 01/19/2013  . Rotator cuff impingement syndrome 10/20/2011  . Chronic gouty arthropathy 04/02/2011  . Severe obesity (BMI 35.0-39.9) with comorbidity ([place]) 04/02/2011  . Osteoarthritis 04/02/2011  . Allergic rhinitis, seasonal 04/02/2011  . Chronic shoulder pain 04/02/2011  . Prediabetes 03/10/2011  . Essential (primary) hypertension 03/10/2011    Immunization History  Administered Date(s) Administered  . Influenza, High Dose Seasonal PF 05/14/2017, 04/07/2018, 04/18/2020  . Influenza, Quadrivalent, Recombinant, Inj, Pf 03/20/2019, 06/12/2019  . Influenza-Unspecified 08/27/2016  . Moderna Sars-Covid-2 Vaccination 09/08/2019, 10/06/2019, 05/20/2020  . Pneumococcal Conjugate-13 08/23/2014,  03/20/2019  . Pneumococcal Polysaccharide-23 03/26/2016  . Td 08/23/2014  . Tdap 08/23/2014  . Zoster 08/08/2013  . Zoster Recombinat (Shingrix) 03/14/2018, 05/16/2018    Conditions to be addressed/monitored:  Hypertension and COPD  Care Plan : [place]  Updates made by Debbora Dus, [facility] since 09/12/2020 12:00 AM    Problem: Hypertension and COPD   Priority: High  Onset Date: 09/04/2020  Note:   Current Barriers:  . None identified  Pharmacist Clinical Goal(s):  [name] Kitchen Over the next 90 days, patient will maintain control of BP and COPD as evidenced by home BP readings within goal and symptom control  through collaboration with PharmD and provider.   Interventions: . 1:1 collaboration with [name], MD regarding development and update of comprehensive plan of care as evidenced by provider attestation and co-signature . Inter-disciplinary care team collaboration (see longitudinal plan of care) . Comprehensive medication review performed; medication list updated in electronic medical record  Hypertension (BP goal <140/90) -controlled -Current treatment: . Amlodipine 5 mg - 1 daily . Triamterene-HCTZ 37.5-25 mg- 1 tablet daily -Medications previously tried: lisinopril, losartan (cough with both) -Current home readings: Checked 1 week ago - 125/68 -Denies hypotensive/hypertensive symptoms -Counseled to monitor BP at home monthly, document, and provide log at future appointments -Recommended to continue current medication  COPD (Goal: control symptoms and prevent exacerbations) -controlled -Current treatment   Albuterol 0.083% nebulizer solution - 3 mLs every 6 hours as needed  Albuterol 90 mcg - 2 puffs every 6 hours as needed  Spiriva 18 mcg - 1 capsule into inhaler once daily -Medications previously tried: none - Last spirometry score: (03/30/17) post FEV1: 37% - Gold Grade: Gold 3 (FEV1 30-49%) - CAT: none, repors low symptom burden -Exacerbations  requiring treatment in last 6 months: none -Patient reports consistent use of maintenance inhaler -Frequency of rescue inhaler use: 1-2x month -Counseled on Benefits of consistent maintenance inhaler use -Recommended to continue current medication  Patient Goals/Self-Care Activities . Over the next 90 days, patient will:  - take medications as prescribed  Follow Up Plan: Telephone follow up appointment with care management team member scheduled for: 4 months       Medication Assistance: None required.  Patient affirms current coverage meets needs.  Patient's preferred pharmacy is:  [facility] - [place], [place] - [address] [place] [address] Phone: [phone] Fax: [phone]  Care Plan and Follow Up Patient Decision:  Patient agrees to Care Plan and Follow-up.  Debbora Dus, PharmD Clinical Pharmacist [facility] [phone]

## 2020-09-05 ENCOUNTER — Encounter: Payer: Self-pay | Admitting: Family Medicine

## 2020-09-13 ENCOUNTER — Encounter: Payer: Self-pay | Admitting: Family Medicine

## 2020-09-13 ENCOUNTER — Other Ambulatory Visit: Payer: Self-pay

## 2020-09-13 ENCOUNTER — Ambulatory Visit (INDEPENDENT_AMBULATORY_CARE_PROVIDER_SITE_OTHER): Payer: PPO | Admitting: Family Medicine

## 2020-09-13 VITALS — BP 138/76 | HR 84 | Temp 98.1°F | Ht 69.0 in | Wt 270.0 lb

## 2020-09-13 DIAGNOSIS — R519 Headache, unspecified: Secondary | ICD-10-CM | POA: Diagnosis not present

## 2020-09-13 MED ORDER — METAXALONE 800 MG PO TABS: 800.0000 mg | ORAL_TABLET | Freq: Three times a day (TID) | ORAL | 0 refills | Status: DC | PRN

## 2020-09-13 NOTE — Progress Notes (Signed)
Patient ID: Aaron Mitchell, male    DOB: 09-12-1945, 75 y.o.   MRN: [id number]  This visit was conducted in person.  BP 138/76   Pulse 84   Temp 98.1 F (36.7 C) (Temporal)   Ht _0  (1.753 m)   Wt 270 lb (122.5 kg)   SpO2 97%   BMI 39.87 kg/m    CC: R jaw pain  Subjective:   HPI: Aaron Mitchell is a 75 y.o. male presenting on 09/13/2020 for Jaw Pain (C/o right jaw joint pain, worsening.  Started 3-4 mos.  Pain radiating up into ear. )   Last seen 06/2019.  3-4 mo h/o R jaw pain with radiation into R ear and teeth - sharp shooting pain to ear, occasionally numb/tingling inside ear canal, with radiation up R temporal region. Worse symptoms at night time. Also wakes up with inability to close jaw - jaw feels misaligned for a few hours in the mornings. He has changed CPAP mask fit without benefit. Denies inciting trauma/injury or falls. No vision changes, eye pain or dizziness.   Prior similar symptoms 05/2019 found to be due to jaw strain from eating crunchy peanuts, saw dentist and ENT, symptoms resolved after stopping peanuts. Flexeril previously ineffective. ESR normal.   Current pain persists despite stopping crunchy foods, limiting chewing.  Chronic "TMJ issues" Remote smoker quit 1980 Alcohol - 5 drinks/wk.      Relevant past medical, surgical, family and social history reviewed and updated as indicated. Interim medical history since our last visit reviewed. Allergies and medications reviewed and updated. Outpatient Medications Prior to Visit  Medication Sig Dispense Refill  . albuterol (PROVENTIL) (2.5 MG/3ML) 0.083% nebulizer solution Take 3 mLs (2.5 mg total) by nebulization every 6 (six) hours as needed for wheezing or shortness of breath. 90 mL 3  . albuterol (VENTOLIN HFA) 108 (90 Base) MCG/ACT inhaler Inhale 2 puffs into the lungs every 6 (six) hours as needed for wheezing or shortness of breath. 3 Inhaler 3  . allopurinol (ZYLOPRIM) 300 MG tablet Take 1 tablet  (300 mg total) by mouth daily. 90 tablet 0  . amLODipine (NORVASC) 5 MG tablet Take 1 tablet (5 mg total) by mouth daily. 90 tablet 3  . atorvastatin (LIPITOR) 40 MG tablet Take 1 tablet (40 mg total) by mouth daily. 90 tablet 0  . diclofenac (VOLTAREN) 75 MG EC tablet Take 1 tablet (75 mg total) by mouth 2 (two) times daily. 180 tablet 0  . diclofenac sodium (VOLTAREN) 1 % GEL Apply 2 g topically daily as needed. Apply to both hands.    . fluticasone (FLONASE) 50 MCG/ACT nasal spray SHAKE LIQUID AND USE 2 SPRAYS IN EACH NOSTRIL DAILY 48 g 1  . Multiple Vitamins tablet Take 1 tablet by mouth daily.    . naproxen sodium (ANAPROX) 220 MG tablet Take 220 mg by mouth daily.     . sildenafil (VIAGRA) 100 MG tablet Take by mouth.    . tadalafil (CIALIS) 20 MG tablet Take 20 mg by mouth daily as needed for erectile dysfunction.    [name] Kitchen testosterone cypionate (DEPOTESTOSTERONE CYPIONATE) 200 MG/ML injection Inject 0.5 mLs (100 mg total) into the muscle once a week. 10 mL 2  . tiotropium (SPIRIVA HANDIHALER) 18 MCG inhalation capsule Place 1 capsule (18 mcg total) into inhaler and inhale daily. 90 capsule 3  . traMADol (ULTRAM) 50 MG tablet Take 1 tablet (50 mg total) by mouth every 12 (twelve) hours as needed for moderate pain.  30 tablet 0  . triamterene-hydrochlorothiazide (MAXZIDE-25) 37.5-25 MG tablet Take 1 tablet by mouth daily. 90 tablet 0  . zolpidem (AMBIEN) 10 MG tablet TAKE 1 TABLET(10 MG) BY MOUTH AT BEDTIME AS NEEDED FOR SLEEP 30 tablet 0  . metaxalone (SKELAXIN) 800 MG tablet Take 1 tablet (800 mg total) by mouth 3 (three) times daily as needed for muscle spasms. 270 tablet 1   Facility-Administered Medications Prior to Visit  Medication Dose Route Frequency Provider Last Rate Last Admin  . methylPREDNISolone acetate (DEPO-MEDROL) injection 80 mg  80 mg Intra-articular Once [name], Spencer, MD         Per HPI unless specifically indicated in ROS section below Review of Systems Objective:   BP 138/76   Pulse 84   Temp 98.1 F (36.7 C) (Temporal)   Ht _0  (1.753 m)   Wt 270 lb (122.5 kg)   SpO2 97%   BMI 39.87 kg/m   Wt Readings from Last 3 Encounters:  09/13/20 270 lb (122.5 kg)  07/10/20 263 lb 6.4 oz (119.5 kg)  06/03/20 261 lb 12 oz (118.7 kg)      Physical Exam Vitals and nursing note reviewed.  Constitutional:      General: He is not in acute distress.    Appearance: Normal appearance. He is obese. He is not ill-appearing.  HENT:     Head: Normocephalic and atraumatic.     Jaw: Tenderness, swelling and pain on movement present. No trismus.     Salivary Glands: Right salivary gland is not diffusely enlarged or tender. Left salivary gland is not diffusely enlarged or tender.     Comments:  No dental pain  TMJ clicking without pain No pain or enlargement of parotids Reproducible tenderness to palpation of R TMJ with swelling present No trismus    Right Ear: Tympanic membrane, ear canal and external ear normal. There is no impacted cerumen.     Left Ear: Tympanic membrane, ear canal and external ear normal. There is no impacted cerumen.     Mouth/Throat:     Mouth: Mucous membranes are moist.     Pharynx: Oropharynx is clear. No oropharyngeal exudate, posterior oropharyngeal erythema or uvula swelling.     Tonsils: No tonsillar exudate or tonsillar abscesses.     Comments:   Eyes:     General:        Right eye: No discharge.        Left eye: No discharge.     Extraocular Movements: Extraocular movements intact.     Conjunctiva/sclera: Conjunctivae normal.     Pupils: Pupils are equal, round, and reactive to light.  Chest:  Breasts:     Right: No supraclavicular adenopathy.     Left: No supraclavicular adenopathy.    Lymphadenopathy:     Head:     Right side of head: No submental, submandibular, tonsillar, preauricular, posterior auricular or occipital adenopathy.     Left side of head: No submental, submandibular, tonsillar, preauricular,  posterior auricular or occipital adenopathy.     Cervical: No cervical adenopathy.     Right cervical: No superficial cervical adenopathy.    Left cervical: No superficial cervical adenopathy.     Upper Body:     Right upper body: No supraclavicular adenopathy.     Left upper body: No supraclavicular adenopathy.  Neurological:     Mental Status: He is alert.  Psychiatric:        Mood and Affect: Mood normal.  Behavior: Behavior normal.       Assessment & Plan:  This visit occurred during the SARS-CoV-2 public health emergency.  Safety protocols were in place, including screening questions prior to the visit, additional usage of staff PPE, and extensive cleaning of exam room while observing appropriate contact time as indicated for disinfecting solutions.   Problem List Items Addressed This Visit    Right facial pain - Primary    Pain, swelling at R TMJ ongoing for months, has not responded to treatment tried to date (limiting chewy and crunchy hard foods).  No obvious mass noted today. He may need further imaging. Will refer to ENT for further evaluation. Will restart diclofenac and skelaxin (flexeril ineffective) in interim.  Pt agrees with plan.  Not consistent with temporal arteritis and previous ESRs all normal.       Relevant Orders   Ambulatory referral to ENT       Meds ordered this encounter  Medications  . metaxalone (SKELAXIN) 800 MG tablet    Sig: Take 1 tablet (800 mg total) by mouth 3 (three) times daily as needed for muscle spasms.    Dispense:  90 tablet    Refill:  0   Orders Placed This Encounter  Procedures  . Ambulatory referral to ENT    Referral Priority:   Routine    Referral Type:   Consultation    Referral Reason:   Specialty Services Required    Requested Specialty:   Otolaryngology    Number of Visits Requested:   1    Patient instructions: I think you have TMJ dysfunction leading to pain and swelling at the joint.  I will refer you  back to ENT for further evaluation.  Start anti inflammatory and muscle relaxant, warm compresses, gentle jaw exercises.   Follow up plan: Return if symptoms worsen or fail to improve.  [name], MD

## 2020-09-13 NOTE — Patient Instructions (Signed)
Dear Aaron Mitchell,  Below is a summary of the goals we discussed during our follow up appointment on September 04, 2020. Please contact me anytime with questions or concerns.   Visit Information  Patient Care Plan: CCM Pharmacy Care Plan    Problem Identified: Hypertension and COPD   Priority: High  Onset Date: 09/04/2020  Note:   Current Barriers:  . None identified  Pharmacist Clinical Goal(s):  [name] Kitchen Over the next 90 days, patient will maintain control of BP and COPD as evidenced by home BP readings within goal and symptom control  through collaboration with PharmD and provider.   Interventions: . 1:1 collaboration with [name], MD regarding development and update of comprehensive plan of care as evidenced by provider attestation and co-signature . Inter-disciplinary care team collaboration (see longitudinal plan of care) . Comprehensive medication review performed; medication list updated in electronic medical record  Hypertension (BP goal <140/90) -controlled -Current treatment: . Amlodipine 5 mg - 1 daily . Triamterene-HCTZ 37.5-25 mg- 1 tablet daily -Medications previously tried: lisinopril, losartan (cough with both) -Current home readings: Checked 1 week ago - 125/68 -Denies hypotensive/hypertensive symptoms -Counseled to monitor BP at home monthly, document, and provide log at future appointments -Recommended to continue current medication  COPD (Goal: control symptoms and prevent exacerbations) -controlled -Current treatment   Albuterol 0.083% nebulizer solution - 3 mLs every 6 hours as needed  Albuterol 90 mcg - 2 puffs every 6 hours as needed  Spiriva 18 mcg - 1 capsule into inhaler once daily -Medications previously tried: none - Last spirometry score: (03/30/17) post FEV1: 37% - Gold Grade: Gold 3 (FEV1 30-49%) - CAT: none, repors low symptom burden -Exacerbations requiring treatment in last 6 months: none -Patient reports consistent use of  maintenance inhaler -Frequency of rescue inhaler use: 1-2x month -Counseled on Benefits of consistent maintenance inhaler use -Recommended to continue current medication  Patient Goals/Self-Care Activities . Over the next 90 days, patient will:  - take medications as prescribed  Follow Up Plan: Telephone follow up appointment with care management team member scheduled for: 4 months       The patient verbalized understanding of instructions, educational materials, and care plan provided today and agreed to receive a mailed copy of patient instructions, educational materials, and care plan.   Debbora Dus, PharmD Clinical Pharmacist [facility] [phone]   Backus stands for Dietary Approaches to Stop Hypertension. The DASH eating plan is a healthy eating plan that has been shown to:  Reduce high blood pressure (hypertension).  Reduce your risk for type 2 diabetes, heart disease, and stroke.  Help with weight loss. What are tips for following this plan? Reading food labels  Check food labels for the amount of salt (sodium) per serving. Choose foods with less than 5 percent of the Daily Value of sodium. Generally, foods with less than 300 milligrams (mg) of sodium per serving fit into this eating plan.  To find whole grains, look for the word "whole" as the first word in the ingredient list. Shopping  Buy products labeled as "low-sodium" or "no salt added."  Buy fresh foods. Avoid canned foods and pre-made or frozen meals. Cooking  Avoid adding salt when cooking. Use salt-free seasonings or herbs instead of table salt or sea salt. Check with your health care provider or pharmacist before using salt substitutes.  Do not fry foods. Cook foods using healthy methods such as baking, boiling, grilling, roasting, and broiling instead.  Cook with heart-healthy oils, such as olive, canola, avocado, soybean, or sunflower oil. Meal  planning  Eat a balanced diet that includes: ? 4 or more servings of fruits and 4 or more servings of vegetables each day. Try to fill one-half of your plate with fruits and vegetables. ? 6-8 servings of whole grains each day. ? Less than 6 oz (170 g) of lean meat, poultry, or fish each day. A 3-oz (85-g) serving of meat is about the same size as a deck of cards. One egg equals 1 oz (28 g). ? 2-3 servings of low-fat dairy each day. One serving is 1 cup (237 mL). ? 1 serving of nuts, seeds, or beans 5 times each week. ? 2-3 servings of heart-healthy fats. Healthy fats called omega-3 fatty acids are found in foods such as walnuts, flaxseeds, fortified milks, and eggs. These fats are also found in cold-water fish, such as sardines, salmon, and mackerel.  Limit how much you eat of: ? Canned or prepackaged foods. ? Food that is high in trans fat, such as some fried foods. ? Food that is high in saturated fat, such as fatty meat. ? Desserts and other sweets, sugary drinks, and other foods with added sugar. ? Full-fat dairy products.  Do not salt foods before eating.  Do not eat more than 4 egg yolks a week.  Try to eat at least 2 vegetarian meals a week.  Eat more home-cooked food and less restaurant, buffet, and fast food.   Lifestyle  When eating at a restaurant, ask that your food be prepared with less salt or no salt, if possible.  If you drink alcohol: ? Limit how much you use to:  0-1 drink a day for women who are not pregnant.  0-2 drinks a day for men. ? Be aware of how much alcohol is in your drink. In the [place] one drink equals one 12 oz bottle of beer (355 mL), one 5 oz glass of wine (148 mL), or one 1 oz glass of hard liquor (44 mL). General information  Avoid eating more than 2,300 mg of salt a day. If you have hypertension, you may need to reduce your sodium intake to 1,500 mg a day.  Work with your health care provider to maintain a healthy body weight or to lose  weight. Ask what an ideal weight is for you.  Get at least 30 minutes of exercise that causes your heart to beat faster (aerobic exercise) most days of the week. Activities may include walking, swimming, or biking.  Work with your health care provider or dietitian to adjust your eating plan to your individual calorie needs. What foods should I eat? Fruits All fresh, dried, or frozen fruit. Canned fruit in natural juice (without added sugar). Vegetables Fresh or frozen vegetables (raw, steamed, roasted, or grilled). Low-sodium or reduced-sodium tomato and vegetable juice. Low-sodium or reduced-sodium tomato sauce and tomato paste. Low-sodium or reduced-sodium canned vegetables. Grains Whole-grain or whole-wheat bread. Whole-grain or whole-wheat pasta. Brown rice. [place]. Bulgur. Whole-grain and low-sodium cereals. Pita bread. Low-fat, low-sodium crackers. Whole-wheat flour tortillas. Meats and other proteins Skinless chicken or [place]. Ground chicken or [place]. Pork with fat trimmed off. Fish and seafood. Egg whites. Dried beans, peas, or lentils. Unsalted nuts, nut butters, and seeds. Unsalted canned beans. Lean cuts of beef with fat trimmed off. Low-sodium, lean precooked or cured meat, such as sausages or meat loaves. Dairy Low-fat (1%) or fat-free (skim) milk. Reduced-fat, low-fat, or fat-free cheeses.  Nonfat, low-sodium ricotta or cottage cheese. Low-fat or nonfat yogurt. Low-fat, low-sodium cheese. Fats and oils Soft margarine without trans fats. Vegetable oil. Reduced-fat, low-fat, or light mayonnaise and salad dressings (reduced-sodium). Canola, safflower, olive, avocado, soybean, and sunflower oils. Avocado. Seasonings and condiments Herbs. Spices. Seasoning mixes without salt. Other foods Unsalted popcorn and pretzels. Fat-free sweets. The items listed above may not be a complete list of foods and beverages you can eat. Contact a dietitian for more information. What foods  should I avoid? Fruits Canned fruit in a light or heavy syrup. Fried fruit. Fruit in cream or butter sauce. Vegetables Creamed or fried vegetables. Vegetables in a cheese sauce. Regular canned vegetables (not low-sodium or reduced-sodium). Regular canned tomato sauce and paste (not low-sodium or reduced-sodium). Regular tomato and vegetable juice (not low-sodium or reduced-sodium). Angie Fava. Olives. Grains Baked goods made with fat, such as croissants, muffins, or some breads. Dry pasta or rice meal packs. Meats and other proteins Fatty cuts of meat. Ribs. Fried meat. Berniece Salines. Bologna, salami, and other precooked or cured meats, such as sausages or meat loaves. Fat from the back of a pig (fatback). Bratwurst. Salted nuts and seeds. Canned beans with added salt. Canned or smoked fish. Whole eggs or egg yolks. Chicken or [place] with skin. Dairy Whole or 2% milk, cream, and half-and-half. Whole or full-fat cream cheese. Whole-fat or sweetened yogurt. Full-fat cheese. Nondairy creamers. Whipped toppings. Processed cheese and cheese spreads. Fats and oils Butter. Stick margarine. Lard. Shortening. Ghee. Bacon fat. Tropical oils, such as coconut, palm kernel, or palm oil. Seasonings and condiments Onion salt, garlic salt, seasoned salt, table salt, and sea salt. Worcestershire sauce. Tartar sauce. Barbecue sauce. Teriyaki sauce. Soy sauce, including reduced-sodium. Steak sauce. Canned and packaged gravies. Fish sauce. Oyster sauce. Cocktail sauce. Store-bought horseradish. Ketchup. Mustard. Meat flavorings and tenderizers. Bouillon cubes. Hot sauces. Pre-made or packaged marinades. Pre-made or packaged taco seasonings. Relishes. Regular salad dressings. Other foods Salted popcorn and pretzels. The items listed above may not be a complete list of foods and beverages you should avoid. Contact a dietitian for more information. Where to find more information  National Heart, Lung, and Blood Institute:  https://wilson-eaton.com/  American Heart Association: www.heart.org  Academy of Nutrition and Dietetics: www.eatright.[place]: www.kidney.org Summary  The DASH eating plan is a healthy eating plan that has been shown to reduce high blood pressure (hypertension). It may also reduce your risk for type 2 diabetes, heart disease, and stroke.  When on the DASH eating plan, aim to eat more fresh fruits and vegetables, whole grains, lean proteins, low-fat dairy, and heart-healthy fats.  With the DASH eating plan, you should limit salt (sodium) intake to 2,300 mg a day. If you have hypertension, you may need to reduce your sodium intake to 1,500 mg a day.  Work with your health care provider or dietitian to adjust your eating plan to your individual calorie needs. This information is not intended to replace advice given to you by your health care provider. Make sure you discuss any questions you have with your health care provider. Document Revised: 06/16/2019 Document Reviewed: 06/16/2019 Elsevier Patient Education  2021 [employer].

## 2020-09-13 NOTE — Patient Instructions (Addendum)
I think you have TMJ dysfunction leading to pain and swelling at the joint.  I will refer you back to ENT for further evaluation.  Start anti inflammatory and muscle relaxant, warm compresses, gentle jaw exercises.   Temporomandibular Joint Syndrome  Temporomandibular joint syndrome (TMJ syndrome) is a condition that causes pain in the temporomandibular joints. These joints are located near your ears and allow your jaw to open and close. For people with TMJ syndrome, chewing, biting, or other movements of the jaw can be difficult or painful. TMJ syndrome is often mild and goes away within a few weeks. However, sometimes the condition becomes a long-term (chronic) problem. What are the causes? This condition may be caused by:  Grinding your teeth or clenching your jaw. Some people do this when they are under stress.  Arthritis.  Injury to the jaw.  Head or neck injury.  Teeth or dentures that are not aligned well. In some cases, the cause of TMJ syndrome may not be known. What are the signs or symptoms? The most common symptom of this condition is an aching pain on the side of the head in the area of the TMJ. Other symptoms may include:  Pain when moving your jaw, such as when chewing or biting.  Being unable to open your jaw all the way.  Making a clicking sound when you open your mouth.  Headache.  Earache.  Neck or shoulder pain. How is this diagnosed? This condition may be diagnosed based on:  Your symptoms and medical history.  A physical exam. Your health care provider may check the range of motion of your jaw.  Imaging tests, such as X-rays or an MRI. You may also need to see your dentist, who will determine if your teeth and jaw are lined up correctly. How is this treated? TMJ syndrome often goes away on its own. If treatment is needed, the options may include:  Eating soft foods and applying ice or heat.  Medicines to relieve pain or inflammation.  Medicines  or massage to relax the muscles.  A splint, bite plate, or mouthpiece to prevent teeth grinding or jaw clenching.  Relaxation techniques or counseling to help reduce stress.  A therapy for pain in which an electrical current is applied to the nerves through the skin (transcutaneous electrical nerve stimulation).  Acupuncture. This is sometimes helpful to relieve pain.  Jaw surgery. This is rarely needed. Follow these instructions at home: Eating and drinking  Eat a soft diet if you are having trouble chewing.  Avoid foods that require a lot of chewing. Do not chew gum. General instructions  Take over-the-counter and prescription medicines only as told by your health care provider.  If directed, put ice on the painful area. ? Put ice in a plastic bag. ? Place a towel between your skin and the bag. ? Leave the ice on for 20 minutes, 2-3 times a day.  Apply a warm, wet cloth (warm compress) to the painful area as directed.  Massage your jaw area and do any jaw stretching exercises as told by your health care provider.  If you were given a splint, bite plate, or mouthpiece, wear it as told by your health care provider.  Keep all follow-up visits as told by your health care provider. This is important.   Contact a health care provider if:  You are having trouble eating.  You have new or worsening symptoms. Get help right away if:  Your jaw locks open or  closed. Summary  Temporomandibular joint syndrome (TMJ syndrome) is a condition that causes pain in the temporomandibular joints. These joints are located near your ears and allow your jaw to open and close.  TMJ syndrome is often mild and goes away within a few weeks. However, sometimes the condition becomes a long-term (chronic) problem.  Symptoms include an aching pain on the side of the head in the area of the TMJ, pain when chewing or biting, and being unable to open your jaw all the way. You may also make a clicking  sound when you open your mouth.  TMJ syndrome often goes away on its own. If treatment is needed, it may include medicines to relieve pain, reduce inflammation, or relax the muscles. A splint, bite plate, or mouthpiece may also be used to prevent teeth grinding or jaw clenching. This information is not intended to replace advice given to you by your health care provider. Make sure you discuss any questions you have with your health care provider. Document Revised: 09/24/2017 Document Reviewed: 08/24/2017 Elsevier Patient Education  2021 [employer].

## 2020-09-13 NOTE — Assessment & Plan Note (Addendum)
Pain, swelling at R TMJ ongoing for months, has not responded to treatment tried to date (limiting chewy and crunchy hard foods).  No obvious mass noted today. He may need further imaging. Will refer to ENT for further evaluation. Will restart diclofenac and skelaxin (flexeril ineffective) in interim.  Pt agrees with plan.  Not consistent with temporal arteritis and previous ESRs all normal.

## 2020-09-14 NOTE — Progress Notes (Signed)
I have collaborated with the care management provider regarding care management and care coordination activities outlined in this encounter and have reviewed this encounter including documentation in the note and care plan. I am certifying that I agree with the content of this note and encounter as supervising physician.  

## 2020-09-20 ENCOUNTER — Telehealth: Payer: Self-pay

## 2020-09-20 NOTE — Chronic Care Management (AMB) (Addendum)
Chronic Care Management Pharmacy Assistant   Name: Aaron Mitchell  MRN: [id number] DOB: [birth year]  Reason for Encounter: Aaron Mitchell Prior Authorization Denial   PCP : [name], MD  Allergies:   Allergies  Allergen Reactions   Codeine Other (See Comments)    Other reaction(s): Other (See Comments) HEADACHES Causes headaches when he stops liquid Codeine-based products   Ace Inhibitors Cough    Cough also present with ARB (losartan)    Medications: Outpatient Encounter Medications as of 09/20/2020  Medication Sig   albuterol (PROVENTIL) (2.5 MG/3ML) 0.083% nebulizer solution Take 3 mLs (2.5 mg total) by nebulization every 6 (six) hours as needed for wheezing or shortness of breath.   albuterol (VENTOLIN HFA) 108 (90 Base) MCG/ACT inhaler Inhale 2 puffs into the lungs every 6 (six) hours as needed for wheezing or shortness of breath.   allopurinol (ZYLOPRIM) 300 MG tablet Take 1 tablet (300 mg total) by mouth daily.   amLODipine (NORVASC) 5 MG tablet Take 1 tablet (5 mg total) by mouth daily.   atorvastatin (LIPITOR) 40 MG tablet Take 1 tablet (40 mg total) by mouth daily.   diclofenac (VOLTAREN) 75 MG EC tablet Take 1 tablet (75 mg total) by mouth 2 (two) times daily.   diclofenac sodium (VOLTAREN) 1 % GEL Apply 2 g topically daily as needed. Apply to both hands.   fluticasone (FLONASE) 50 MCG/ACT nasal spray SHAKE LIQUID AND USE 2 SPRAYS IN EACH NOSTRIL DAILY   metaxalone (SKELAXIN) 800 MG tablet Take 1 tablet (800 mg total) by mouth 3 (three) times daily as needed for muscle spasms.   Multiple Vitamins tablet Take 1 tablet by mouth daily.   naproxen sodium (ANAPROX) 220 MG tablet Take 220 mg by mouth daily.    sildenafil (VIAGRA) 100 MG tablet Take by mouth.   tadalafil (CIALIS) 20 MG tablet Take 20 mg by mouth daily as needed for erectile dysfunction.   testosterone cypionate (DEPOTESTOSTERONE CYPIONATE) 200 MG/ML injection Inject 0.5 mLs (100 mg total) into the  muscle once a week.   tiotropium (SPIRIVA HANDIHALER) 18 MCG inhalation capsule Place 1 capsule (18 mcg total) into inhaler and inhale daily.   traMADol (ULTRAM) 50 MG tablet Take 1 tablet (50 mg total) by mouth every 12 (twelve) hours as needed for moderate pain.   triamterene-hydrochlorothiazide (MAXZIDE-25) 37.5-25 MG tablet Take 1 tablet by mouth daily.   zolpidem (AMBIEN) 10 MG tablet TAKE 1 TABLET(10 MG) BY MOUTH AT BEDTIME AS NEEDED FOR SLEEP   Facility-Administered Encounter Medications as of 09/20/2020  Medication   methylPREDNISolone acetate (DEPO-MEDROL) injection 80 mg    Current Diagnosis: Patient Active Problem List   Diagnosis Date Noted   Right facial pain 06/01/2019   Sore throat 03/07/2019   Cough due to ACE inhibitor 06/15/2018   Bilateral hip pain 06/09/2018   Background diabetic retinopathy ([place]) 08/17/2017   Greater trochanteric bursitis of right hip 07/30/2017   Health maintenance examination 07/14/2017   Advanced care planning/counseling discussion 07/14/2017   Stage 3 severe COPD by GOLD classification ([place]) 03/31/2017   Chest congestion 12/08/2016   Moderate mitral insufficiency 09/14/2016   Male hypogonadism    Insomnia 05/12/2016   OSA (obstructive sleep apnea) 05/12/2016   Chronic back pain    Degenerative disc disease, lumbar    Hyperlipidemia associated with type 2 diabetes mellitus ([place]) 09/17/2015   ED (erectile dysfunction) of organic origin 03/25/2015   CMC arthritis, thumb, degenerative 01/19/2013   Rotator cuff impingement syndrome 10/20/2011  Chronic gouty arthropathy 04/02/2011   Severe obesity (BMI 35.0-39.9) with comorbidity ([place]) 04/02/2011   Osteoarthritis 04/02/2011   Allergic rhinitis, seasonal 04/02/2011   Chronic shoulder pain 04/02/2011   Prediabetes 03/10/2011   Essential (primary) hypertension 03/10/2011   Contacted patient's insurance to follow up on PA. Per representative at [facility] the metaxalone is being used for  chronic back pain. They will not approve anything that is not acute. Denial terms were faxed to Dr. Bosie Clos office.   Follow-Up:  Pharmacist Review  Debbora Dus, CPP notified  Margaretmary Dys, [place] Pharmacy Assistant [phone]

## 2020-09-20 NOTE — Telephone Encounter (Signed)
Aaron Mitchell, did the denial letter suggest alternatives?

## 2020-09-20 NOTE — Telephone Encounter (Addendum)
We haven't received denial letter for this med.  I don't see that a PA was submitted.

## 2020-09-23 NOTE — Telephone Encounter (Addendum)
[facility] sent a new PA request to the office on 2/21 since a new rx was sent for the metaxalone. This med was previously declined due to reasons below. We called to check on the status of PA on 2/25. I assumed they were referring to a current PA but if you all have not sent one, maybe they were referring to the PA back in August of last year. I doubt they will approve since its still a 49 DS/chronic issue. Will see if Dr. Darnell Level wants to send something else in for him.  Debbora Dus, PharmD Clinical Pharmacist [facility] at [facility] [phone]

## 2020-09-24 NOTE — Telephone Encounter (Addendum)
Would have him price out out of pocket as I don't think insurance will cover any muscle relaxant for chronic back pain.  Or he could call and find out alternative muscle relaxants that insurance would cover.

## 2020-09-24 NOTE — Telephone Encounter (Signed)
Ria Comment, can you let him know cash price is $50 for #90 tablets if he would like [place] to fill this for him.

## 2020-09-25 ENCOUNTER — Telehealth: Payer: Self-pay

## 2020-09-25 NOTE — Telephone Encounter (Signed)
Per Ria Comment, pt states that the muscle relaxer is for his jaw pain.   I submitted PA; key:  [id number].  Decision pending.

## 2020-09-25 NOTE — Chronic Care Management (AMB) (Signed)
Contacted patient to inform him of the cash price of $50 for 90 tablets of metaxalone. Patient did not answer. Message left for patient to return call. Insurance will not cover medication he is requesting. Insurance states baclofen and tizanidine are tier 1. Will discuss with patient when he returns call.    Follow-Up:  Medication Cost Review and Pharmacist Review  Debbora Dus, CPP notified  Margaretmary Dys, [place] [phone]  Total time spent for month: 5

## 2020-09-25 NOTE — Chronic Care Management (AMB) (Signed)
Chronic Care Management Pharmacy Assistant   Name: Eliav Mechling  MRN: [id number] DOB: 02/24/1946  Reason for Encounter: Prior Authorization Denial Follow Up   PCP : [name], MD  Allergies:   Allergies  Allergen Reactions  . Codeine Other (See Comments)    Other reaction(s): Other (See Comments) HEADACHES Causes headaches when he stops liquid Codeine-based products  . Ace Inhibitors Cough    Cough also present with ARB (losartan)    Medications: Outpatient Encounter Medications as of 09/25/2020  Medication Sig  . albuterol (PROVENTIL) (2.5 MG/3ML) 0.083% nebulizer solution Take 3 mLs (2.5 mg total) by nebulization every 6 (six) hours as needed for wheezing or shortness of breath.  [name] Kitchen albuterol (VENTOLIN HFA) 108 (90 Base) MCG/ACT inhaler Inhale 2 puffs into the lungs every 6 (six) hours as needed for wheezing or shortness of breath.  . allopurinol (ZYLOPRIM) 300 MG tablet Take 1 tablet (300 mg total) by mouth daily.  [name] Kitchen amLODipine (NORVASC) 5 MG tablet Take 1 tablet (5 mg total) by mouth daily.  [name] Kitchen atorvastatin (LIPITOR) 40 MG tablet Take 1 tablet (40 mg total) by mouth daily.  . diclofenac (VOLTAREN) 75 MG EC tablet Take 1 tablet (75 mg total) by mouth 2 (two) times daily.  . diclofenac sodium (VOLTAREN) 1 % GEL Apply 2 g topically daily as needed. Apply to both hands.  . fluticasone (FLONASE) 50 MCG/ACT nasal spray SHAKE LIQUID AND USE 2 SPRAYS IN EACH NOSTRIL DAILY  . metaxalone (SKELAXIN) 800 MG tablet Take 1 tablet (800 mg total) by mouth 3 (three) times daily as needed for muscle spasms.  . Multiple Vitamins tablet Take 1 tablet by mouth daily.  . naproxen sodium (ANAPROX) 220 MG tablet Take 220 mg by mouth daily.   . sildenafil (VIAGRA) 100 MG tablet Take by mouth.  . tadalafil (CIALIS) 20 MG tablet Take 20 mg by mouth daily as needed for erectile dysfunction.  [name] Kitchen testosterone cypionate (DEPOTESTOSTERONE CYPIONATE) 200 MG/ML injection Inject 0.5 mLs (100 mg total)  into the muscle once a week.  . tiotropium (SPIRIVA HANDIHALER) 18 MCG inhalation capsule Place 1 capsule (18 mcg total) into inhaler and inhale daily.  . traMADol (ULTRAM) 50 MG tablet Take 1 tablet (50 mg total) by mouth every 12 (twelve) hours as needed for moderate pain.  [name] Kitchen triamterene-hydrochlorothiazide (MAXZIDE-25) 37.5-25 MG tablet Take 1 tablet by mouth daily.  [name] Kitchen zolpidem (AMBIEN) 10 MG tablet TAKE 1 TABLET(10 MG) BY MOUTH AT BEDTIME AS NEEDED FOR SLEEP   Facility-Administered Encounter Medications as of 09/25/2020  Medication  . methylPREDNISolone acetate (DEPO-MEDROL) injection 80 mg    Current Diagnosis: Patient Active Problem List   Diagnosis Date Noted  . Right facial pain 06/01/2019  . Sore throat 03/07/2019  . Cough due to ACE inhibitor 06/15/2018  . Bilateral hip pain 06/09/2018  . Background diabetic retinopathy ([place]) 08/17/2017  . Greater trochanteric bursitis of right hip 07/30/2017  . Health maintenance examination 07/14/2017  . Advanced care planning/counseling discussion 07/14/2017  . Stage 3 severe COPD by GOLD classification ([place]) 03/31/2017  . Chest congestion 12/08/2016  . Moderate mitral insufficiency 09/14/2016  . Male hypogonadism   . Insomnia 05/12/2016  . OSA (obstructive sleep apnea) 05/12/2016  . Chronic back pain   . Degenerative disc disease, lumbar   . Hyperlipidemia associated with type 2 diabetes mellitus ([place]) 09/17/2015  . ED (erectile dysfunction) of organic origin 03/25/2015  . CMC arthritis, thumb, degenerative 01/19/2013  . Rotator cuff impingement syndrome 10/20/2011  .  Chronic gouty arthropathy 04/02/2011  . Severe obesity (BMI 35.0-39.9) with comorbidity ([place]) 04/02/2011  . Osteoarthritis 04/02/2011  . Allergic rhinitis, seasonal 04/02/2011  . Chronic shoulder pain 04/02/2011  . Prediabetes 03/10/2011  . Essential (primary) hypertension 03/10/2011   Spoke with patient this morning, he states that the metaxalone is for his jaw. He  is asking if Dr. [name] will resend the prescription stating for acute jaw pain in the sig. Message has been sent to Dr. Katheran James CMA.    Follow-Up:  Pharmacist Review  Debbora Dus, CPP notified  Margaretmary Dys, [place] [phone]  Total time spent for month: 20

## 2020-09-27 ENCOUNTER — Other Ambulatory Visit: Payer: Self-pay

## 2020-09-27 ENCOUNTER — Telehealth: Payer: Self-pay | Admitting: *Deleted

## 2020-09-27 DIAGNOSIS — R7989 Other specified abnormal findings of blood chemistry: Secondary | ICD-10-CM

## 2020-09-27 DIAGNOSIS — E291 Testicular hypofunction: Secondary | ICD-10-CM

## 2020-09-27 NOTE — Telephone Encounter (Signed)
Received faxed PA form.  Placed in Dr. Synthia Innocent box.

## 2020-09-27 NOTE — Telephone Encounter (Signed)
Aaron Mitchell from [place] left a voicemail stating that she needs a call back regarding a prior authorization. When calling back hit option 3 and the reference number si [id number].

## 2020-09-30 ENCOUNTER — Other Ambulatory Visit: Payer: PPO

## 2020-09-30 ENCOUNTER — Telehealth: Payer: Self-pay | Admitting: Family Medicine

## 2020-09-30 ENCOUNTER — Other Ambulatory Visit: Payer: Self-pay

## 2020-09-30 ENCOUNTER — Encounter: Payer: Self-pay | Admitting: Family Medicine

## 2020-09-30 DIAGNOSIS — E291 Testicular hypofunction: Secondary | ICD-10-CM

## 2020-09-30 DIAGNOSIS — R7989 Other specified abnormal findings of blood chemistry: Secondary | ICD-10-CM

## 2020-09-30 NOTE — Telephone Encounter (Signed)
Otila Kluver with [employer] left a voicemail stating that patient needs to get the appeals process started on the PA for his medication. Otila Kluver stated that this process needs to be started so that they can get the medication to the patient. Otila Kluver requested that [employer] at [phone] Tina's telephone number is [phone] if you have any questions

## 2020-09-30 NOTE — Telephone Encounter (Signed)
Patient called about his Metaxalone prescription.  Patient said he took this medication before for back spasms and it should specify pain in right jaw.  Insurance won't pay for prescription because they think it's for back spasms. The prescription is $75 without insurance. Patient uses [facility].

## 2020-09-30 NOTE — Telephone Encounter (Signed)
Received faxed PA denial.  Reason:  The drug is requested for an unapproved medical condition (dx):  Jaw pain.   FYI to Dr. Darnell Level.

## 2020-09-30 NOTE — Telephone Encounter (Signed)
Attempted to contact Tina.  Automated message stated hold time was 20 mins.  I try to reach her tomorrow.

## 2020-09-30 NOTE — Telephone Encounter (Signed)
Left message on vm per dpr informing pt a PA was resubmitted using the code for right jaw pain.  We are just waiting to see what his insurance co decides.

## 2020-10-01 LAB — HEMOGLOBIN AND HEMATOCRIT, BLOOD
Hematocrit: 51.6 % — ABNORMAL HIGH (ref 37.5–51.0)
Hemoglobin: 18.1 g/dL — ABNORMAL HIGH (ref 13.0–17.7)

## 2020-10-01 LAB — TESTOSTERONE: Testosterone: 1070 ng/dL — ABNORMAL HIGH (ref 264–916)

## 2020-10-01 LAB — PSA: Prostate Specific Ag, Serum: 1 ng/mL (ref 0.0–4.0)

## 2020-10-02 NOTE — Telephone Encounter (Signed)
May resubmit but I don't know it will change anything or that they will cover this.  I can't say this is "new" pain if it's an ongoing issue.  I will fill out for acute exacerbation of R jaw pain but they may not want to fill when we're sending #90 as that's an indication of a chronic issue - it may need to go through local pharmacy at lower #.

## 2020-10-02 NOTE — Telephone Encounter (Signed)
Pt calling stating ins is denying PA because it's showing "continued therapy" from 2014.  Says he spoke with his ins co and the PA needs to state rx is for acute jaw pain.  Pt requests the corrections be made and PA be resubmitted.  Received faxed PA form.  Placed in Dr. Synthia Innocent box.

## 2020-10-03 NOTE — Telephone Encounter (Signed)
Faxed PA.  Decision pending.

## 2020-10-04 ENCOUNTER — Other Ambulatory Visit: Payer: Self-pay

## 2020-10-04 ENCOUNTER — Encounter: Payer: Self-pay | Admitting: Urology

## 2020-10-04 ENCOUNTER — Ambulatory Visit: Payer: PPO | Admitting: Urology

## 2020-10-04 VITALS — BP 160/93 | HR 70 | Ht 71.0 in | Wt 270.0 lb

## 2020-10-04 DIAGNOSIS — E291 Testicular hypofunction: Secondary | ICD-10-CM

## 2020-10-04 NOTE — Telephone Encounter (Signed)
Insurance states baclofen and tizanidine are tier 1.

## 2020-10-04 NOTE — Progress Notes (Signed)
10/04/2020 1:00 PM   Aaron Mitchell 12/23/1945 [id number]  Referring provider: [name], MD [address] [place],   [address]  Chief Complaint  Patient presents with  . Hypogonadism    Urologic history: 1.  Hypogonadism -Symptoms tiredness, fatigue, ED, decreased libido -TRT 100 mg testosterone cypionate weekly   HPI: 75 y.o. male presents for annual follow-up.   Long history of hypogonadism previously followed by Dr. [name] and most recently Dr. [name]  Last office visit March 2021  No bothersome LUTS  Remains on TRT 100 mg weekly  Labs 09/30/2020: Testosterone 1070 (drawn 2 days after injection), hematocrit 51.6, PSA stable 1.0  Uses tadalafil occasionally   PMH: Past Medical History:  Diagnosis Date  . Achilles tendinitis    Left leg  . Borderline diabetes mellitus   . Childhood asthma   . Degenerative disc disease, lumbar    rec against surgery  . Erectile dysfunction   . GI bleed 12/2015   hospitalization  . Gout   . Hypertension   . Ileus ([place]) 12/2015   hospitalization   . Male hypogonadism    followed by urology on T injections ([place])  . Seasonal allergies    pollen  . Severe obesity (BMI >= 40) ([facility])   . Shoulder bursitis   . Sleep apnea    CPAP    Surgical History: Past Surgical History:  Procedure Laterality Date  . ADENOIDECTOMY    . CARPAL TUNNEL RELEASE Right 2015  . CATARACT EXTRACTION W/PHACO Right 07/15/2016   Procedure: CATARACT EXTRACTION PHACO AND INTRAOCULAR LENS PLACEMENT (IOC);  Surgeon: Leandrew Koyanagi, MD;  Location: [facility];  Service: Ophthalmology;  Laterality: Right;  TORIC sleep apnea  . CATARACT EXTRACTION W/PHACO Left 08/05/2016   Procedure: CATARACT EXTRACTION PHACO AND INTRAOCULAR LENS PLACEMENT (IOC) toric;  Surgeon: Leandrew Koyanagi, MD;  Location: [facility];  Service: Ophthalmology;  Laterality: Left;  left sleep apnea toric  . COLONOSCOPY  10/2012   rpt 5  yrs ([facility])  . EYE SURGERY     Lens replacement 07/12/2016,08/05/2016  . LUMBAR EPIDURAL INJECTION Bilateral 04/2017, 12/2017   S1 transforaminal ESI ([place])  . RHINOPLASTY    . TONSILLECTOMY  ?1954  . TRIGGER FINGER RELEASE Right 2015   4th    Home Medications:  Allergies as of 10/04/2020      Reactions   Codeine Other (See Comments)   Other reaction(s): Other (See Comments) HEADACHES Causes headaches when he stops liquid Codeine-based products   Ace Inhibitors Cough   Cough also present with ARB (losartan)      Medication List       Accurate as of October 04, 2020  1:00 PM. If you have any questions, ask your nurse or doctor.        albuterol (2.5 MG/3ML) 0.083% nebulizer solution Commonly known as: PROVENTIL Take 3 mLs (2.5 mg total) by nebulization every 6 (six) hours as needed for wheezing or shortness of breath.   albuterol 108 (90 Base) MCG/ACT inhaler Commonly known as: VENTOLIN HFA Inhale 2 puffs into the lungs every 6 (six) hours as needed for wheezing or shortness of breath.   allopurinol 300 MG tablet Commonly known as: ZYLOPRIM Take 1 tablet (300 mg total) by mouth daily.   amLODipine 5 MG tablet Commonly known as: NORVASC Take 1 tablet (5 mg total) by mouth daily.   atorvastatin 40 MG tablet Commonly known as: LIPITOR Take 1 tablet (40 mg total) by mouth daily.  diclofenac 75 MG EC tablet Commonly known as: VOLTAREN Take 1 tablet (75 mg total) by mouth 2 (two) times daily.   diclofenac sodium 1 % Gel Commonly known as: VOLTAREN Apply 2 g topically daily as needed. Apply to both hands.   fluticasone 50 MCG/ACT nasal spray Commonly known as: FLONASE SHAKE LIQUID AND USE 2 SPRAYS IN EACH NOSTRIL DAILY   metaxalone 800 MG tablet Commonly known as: SKELAXIN Take 1 tablet (800 mg total) by mouth 3 (three) times daily as needed for muscle spasms.   Multiple Vitamins tablet Take 1 tablet by mouth daily.   naproxen sodium 220 MG  tablet Commonly known as: ALEVE Take 220 mg by mouth daily.   sildenafil 100 MG tablet Commonly known as: VIAGRA Take by mouth.   Spiriva HandiHaler 18 MCG inhalation capsule Generic drug: tiotropium Place 1 capsule (18 mcg total) into inhaler and inhale daily.   tadalafil 20 MG tablet Commonly known as: CIALIS Take 20 mg by mouth daily as needed for erectile dysfunction.   testosterone cypionate 200 MG/ML injection Commonly known as: DEPOTESTOSTERONE CYPIONATE Inject 0.5 mLs (100 mg total) into the muscle once a week.   traMADol 50 MG tablet Commonly known as: ULTRAM Take 1 tablet (50 mg total) by mouth every 12 (twelve) hours as needed for moderate pain.   triamterene-hydrochlorothiazide 37.5-25 MG tablet Commonly known as: MAXZIDE-25 Take 1 tablet by mouth daily.   zolpidem 10 MG tablet Commonly known as: AMBIEN TAKE 1 TABLET(10 MG) BY MOUTH AT BEDTIME AS NEEDED FOR SLEEP       Allergies:  Allergies  Allergen Reactions  . Codeine Other (See Comments)    Other reaction(s): Other (See Comments) HEADACHES Causes headaches when he stops liquid Codeine-based products  . Ace Inhibitors Cough    Cough also present with ARB (losartan)    Family History: Family History  Problem Relation Age of Onset  . Alcohol abuse Father   . Hypertension Father   . AVM Father 63       hemorrhagic stroke  . Diabetes Paternal Grandmother   . Cancer Neg Hx   . CAD Neg Hx   . Prostate cancer Neg Hx   . Bladder Cancer Neg Hx   . Kidney cancer Neg Hx     Social History:  reports that he quit smoking about 42 years ago. He has never used smokeless tobacco. He reports current alcohol use of about 5.0 standard drinks of alcohol per week. He reports that he does not use drugs.   Physical Exam: BP (!) 160/93   Pulse 70   Ht 5\' 11"  (1.803 m)   Wt 270 lb (122.5 kg)   BMI 37.66 kg/m   Constitutional:  Alert and oriented, No acute distress. HEENT: [place] AT, moist mucus membranes.   Trachea midline, no masses. Cardiovascular: No clubbing, cyanosis, or edema. Respiratory: Normal respiratory effort, no increased work of breathing. GU: Prostate 30 g, smooth without nodules Skin: No rashes, bruises or suspicious lesions. Neurologic: Grossly intact, no focal deficits, moving all 4 extremities. Psychiatric: Normal mood and affect.   Assessment & Plan:    1.  Hypogonadism  Stable symptoms on TRT  Hematocrit slightly elevated and he states he is able to donate blood and recommend he do this periodically  Lab visit 6 months for testosterone, hematocrit  Office visit 12 months testosterone, hematocrit, PSA and DRE   Abbie Sons, MD  [facility] [address], [place], [place]  [address] [phone]  

## 2020-10-07 ENCOUNTER — Other Ambulatory Visit: Payer: Self-pay | Admitting: Unknown Physician Specialty

## 2020-10-07 DIAGNOSIS — M792 Neuralgia and neuritis, unspecified: Secondary | ICD-10-CM | POA: Diagnosis not present

## 2020-10-07 DIAGNOSIS — T161XXA Foreign body in right ear, initial encounter: Secondary | ICD-10-CM | POA: Diagnosis not present

## 2020-10-07 DIAGNOSIS — G501 Atypical facial pain: Secondary | ICD-10-CM

## 2020-10-07 DIAGNOSIS — M26649 Arthritis of unspecified temporomandibular joint: Secondary | ICD-10-CM | POA: Diagnosis not present

## 2020-10-09 ENCOUNTER — Other Ambulatory Visit: Payer: Self-pay

## 2020-10-09 ENCOUNTER — Ambulatory Visit (INDEPENDENT_AMBULATORY_CARE_PROVIDER_SITE_OTHER): Payer: PPO

## 2020-10-09 DIAGNOSIS — Z0000 Encounter for general adult medical examination without abnormal findings: Secondary | ICD-10-CM

## 2020-10-09 NOTE — Patient Instructions (Signed)
Aaron Mitchell , Thank you for taking time to come for your Medicare Wellness Visit. I appreciate your ongoing commitment to your health goals. Please review the following plan we discussed and let me know if I can assist you in the future.   Screening recommendations/referrals: Colonoscopy: Cologuard completed 01/30/2018, due 01/2021 Recommended yearly ophthalmology/optometry visit for glaucoma screening and checkup Recommended yearly dental visit for hygiene and checkup  Vaccinations: Influenza vaccine: Up to date, completed 04/18/2020, due 02/2021 Pneumococcal vaccine: Completed series Tdap vaccine: Up to date, completed 08/23/2014, due 07/2024 Shingles vaccine: Completed series   Covid-19: Completed series  Advanced directives: Please bring a copy of your POA (Power of Attorney) and/or Living Will to your next appointment.   Conditions/risks identified: diabetes, hypertension, hyperlipidemia   Next appointment: Follow up in one year for your annual wellness visit.   Preventive Care 73 Years and Older, Male Preventive care refers to lifestyle choices and visits with your health care provider that can promote health and wellness. What does preventive care include?  A yearly physical exam. This is also called an annual well check.  Dental exams once or twice a year.  Routine eye exams. Ask your health care provider how often you should have your eyes checked.  Personal lifestyle choices, including:  Daily care of your teeth and gums.  Regular physical activity.  Eating a healthy diet.  Avoiding tobacco and drug use.  Limiting alcohol use.  Practicing safe sex.  Taking low doses of aspirin every day.  Taking vitamin and mineral supplements as recommended by your health care provider. What happens during an annual well check? The services and screenings done by your health care provider during your annual well check will depend on your age, overall health, lifestyle risk  factors, and family history of disease. Counseling  Your health care provider may ask you questions about your:  Alcohol use.  Tobacco use.  Drug use.  Emotional well-being.  Home and relationship well-being.  Sexual activity.  Eating habits.  History of falls.  Memory and ability to understand (cognition).  Work and work Statistician. Screening  You may have the following tests or measurements:  Height, weight, and BMI.  Blood pressure.  Lipid and cholesterol levels. These may be checked every 5 years, or more frequently if you are over 40 years old.  Skin check.  Lung cancer screening. You may have this screening every year starting at age 73 if you have a 30-pack-year history of smoking and currently smoke or have quit within the past 15 years.  Fecal occult blood test (FOBT) of the stool. You may have this test every year starting at age 73.  Flexible sigmoidoscopy or colonoscopy. You may have a sigmoidoscopy every 5 years or a colonoscopy every 10 years starting at age 26.  Prostate cancer screening. Recommendations will vary depending on your family history and other risks.  Hepatitis C blood test.  Hepatitis B blood test.  Sexually transmitted disease (STD) testing.  Diabetes screening. This is done by checking your blood sugar (glucose) after you have not eaten for a while (fasting). You may have this done every 1-3 years.  Abdominal aortic aneurysm (AAA) screening. You may need this if you are a current or former smoker.  Osteoporosis. You may be screened starting at age 73 if you are at high risk. Talk with your health care provider about your test results, treatment options, and if necessary, the need for more tests. Vaccines  Your health care  provider may recommend certain vaccines, such as:  Influenza vaccine. This is recommended every year.  Tetanus, diphtheria, and acellular pertussis (Tdap, Td) vaccine. You may need a Td booster every 10  years.  Zoster vaccine. You may need this after age 10.  Pneumococcal 13-valent conjugate (PCV13) vaccine. One dose is recommended after age 20.  Pneumococcal polysaccharide (PPSV23) vaccine. One dose is recommended after age 29. Talk to your health care provider about which screenings and vaccines you need and how often you need them. This information is not intended to replace advice given to you by your health care provider. Make sure you discuss any questions you have with your health care provider. Document Released: 08/09/2015 Document Revised: 04/01/2016 Document Reviewed: 05/14/2015 Elsevier Interactive Patient Education  2017 [place] Prevention in the Home Falls can cause injuries. They can happen to people of all ages. There are many things you can do to make your home safe and to help prevent falls. What can I do on the outside of my home?  Regularly fix the edges of walkways and driveways and fix any cracks.  Remove anything that might make you trip as you walk through a door, such as a raised step or threshold.  Trim any bushes or trees on the path to your home.  Use bright outdoor lighting.  Clear any walking paths of anything that might make someone trip, such as rocks or tools.  Regularly check to see if handrails are loose or broken. Make sure that both sides of any steps have handrails.  Any raised decks and porches should have guardrails on the edges.  Have any leaves, snow, or ice cleared regularly.  Use sand or salt on walking paths during winter.  Clean up any spills in your garage right away. This includes oil or grease spills. What can I do in the bathroom?  Use night lights.  Install grab bars by the toilet and in the tub and shower. Do not use towel bars as grab bars.  Use non-skid mats or decals in the tub or shower.  If you need to sit down in the shower, use a plastic, non-slip stool.  Keep the floor dry. Clean up any water that  spills on the floor as soon as it happens.  Remove soap buildup in the tub or shower regularly.  Attach bath mats securely with double-sided non-slip rug tape.  Do not have throw rugs and other things on the floor that can make you trip. What can I do in the bedroom?  Use night lights.  Make sure that you have a light by your bed that is easy to reach.  Do not use any sheets or blankets that are too big for your bed. They should not hang down onto the floor.  Have a firm chair that has side arms. You can use this for support while you get dressed.  Do not have throw rugs and other things on the floor that can make you trip. What can I do in the kitchen?  Clean up any spills right away.  Avoid walking on wet floors.  Keep items that you use a lot in easy-to-reach places.  If you need to reach something above you, use a strong step stool that has a grab bar.  Keep electrical cords out of the way.  Do not use floor polish or wax that makes floors slippery. If you must use wax, use non-skid floor wax.  Do not have throw  rugs and other things on the floor that can make you trip. What can I do with my stairs?  Do not leave any items on the stairs.  Make sure that there are handrails on both sides of the stairs and use them. Fix handrails that are broken or loose. Make sure that handrails are as long as the stairways.  Check any carpeting to make sure that it is firmly attached to the stairs. Fix any carpet that is loose or worn.  Avoid having throw rugs at the top or bottom of the stairs. If you do have throw rugs, attach them to the floor with carpet tape.  Make sure that you have a light switch at the top of the stairs and the bottom of the stairs. If you do not have them, ask someone to add them for you. What else can I do to help prevent falls?  Wear shoes that:  Do not have high heels.  Have rubber bottoms.  Are comfortable and fit you well.  Are closed at the  toe. Do not wear sandals.  If you use a stepladder:  Make sure that it is fully opened. Do not climb a closed stepladder.  Make sure that both sides of the stepladder are locked into place.  Ask someone to hold it for you, if possible.  Clearly mark and make sure that you can see:  Any grab bars or handrails.  First and last steps.  Where the edge of each step is.  Use tools that help you move around (mobility aids) if they are needed. These include:  Canes.  Walkers.  Scooters.  Crutches.  Turn on the lights when you go into a dark area. Replace any light bulbs as soon as they burn out.  Set up your furniture so you have a clear path. Avoid moving your furniture around.  If any of your floors are uneven, fix them.  If there are any pets around you, be aware of where they are.  Review your medicines with your doctor. Some medicines can make you feel dizzy. This can increase your chance of falling. Ask your doctor what other things that you can do to help prevent falls. This information is not intended to replace advice given to you by your health care provider. Make sure you discuss any questions you have with your health care provider. Document Released: 05/09/2009 Document Revised: 12/19/2015 Document Reviewed: 08/17/2014 Elsevier Interactive Patient Education  2017 [employer].

## 2020-10-09 NOTE — Progress Notes (Signed)
Subjective:   Aaron Mitchell is a 75 y.o. male who presents for Medicare Annual/Subsequent preventive examination.  Review of Systems: N/A      I connected with the patient today by telephone and verified that I am speaking with the correct person using two identifiers. Location patient: home Location nurse: work Persons participating in the telephone visit: patient, nurse.   I discussed the limitations, risks, security and privacy concerns of performing an evaluation and management service by telephone and the availability of in person appointments. I also discussed with the patient that there may be a patient responsible charge related to this service. The patient expressed understanding and verbally consented to this telephonic visit.        Cardiac Risk Factors include: advanced age (>46men, >48 women);male gender;diabetes mellitus;hypertension;Other (see comment), Risk factor comments: hyperlipidemia     Objective:    Today's Vitals   There is no height or weight on file to calculate BMI.  Advanced Directives 10/09/2020 07/25/2019 07/05/2018 07/01/2017 08/05/2016 07/15/2016 12/28/2015  Does Patient Have a Medical Advance Directive? Yes Yes Yes Yes Yes Yes Yes  Type of Paramedic of [place];Living will [place];Living will [place];Living will [place];Living will [place];Living will [place];Living will [place];Living will  Does patient want to make changes to medical advance directive? - - - - - - No - Patient declined  Copy of [place] in Chart? No - copy requested Yes - validated most recent copy scanned in chart (See row information) No - copy requested No - copy requested Yes Yes Yes  Would patient like information on creating a medical advance directive? - - - - - - No - patient declined information    Current  Medications (verified) Outpatient Encounter Medications as of 10/09/2020  Medication Sig  . albuterol (PROVENTIL) (2.5 MG/3ML) 0.083% nebulizer solution Take 3 mLs (2.5 mg total) by nebulization every 6 (six) hours as needed for wheezing or shortness of breath.  [name] Kitchen albuterol (VENTOLIN HFA) 108 (90 Base) MCG/ACT inhaler Inhale 2 puffs into the lungs every 6 (six) hours as needed for wheezing or shortness of breath.  . allopurinol (ZYLOPRIM) 300 MG tablet Take 1 tablet (300 mg total) by mouth daily.  [name] Kitchen amLODipine (NORVASC) 5 MG tablet Take 1 tablet (5 mg total) by mouth daily.  [name] Kitchen atorvastatin (LIPITOR) 40 MG tablet Take 1 tablet (40 mg total) by mouth daily.  . diclofenac (VOLTAREN) 75 MG EC tablet Take 1 tablet (75 mg total) by mouth 2 (two) times daily.  . diclofenac sodium (VOLTAREN) 1 % GEL Apply 2 g topically daily as needed. Apply to both hands.  . fluticasone (FLONASE) 50 MCG/ACT nasal spray SHAKE LIQUID AND USE 2 SPRAYS IN EACH NOSTRIL DAILY  . metaxalone (SKELAXIN) 800 MG tablet Take 1 tablet (800 mg total) by mouth 3 (three) times daily as needed for muscle spasms.  . Multiple Vitamins tablet Take 1 tablet by mouth daily.  . naproxen sodium (ANAPROX) 220 MG tablet Take 220 mg by mouth daily.   . sildenafil (VIAGRA) 100 MG tablet Take by mouth.  . tadalafil (CIALIS) 20 MG tablet Take 20 mg by mouth daily as needed for erectile dysfunction.  [name] Kitchen testosterone cypionate (DEPOTESTOSTERONE CYPIONATE) 200 MG/ML injection Inject 0.5 mLs (100 mg total) into the muscle once a week.  . tiotropium (SPIRIVA HANDIHALER) 18 MCG inhalation capsule Place 1 capsule (18  mcg total) into inhaler and inhale daily.  . traMADol (ULTRAM) 50 MG tablet Take 1 tablet (50 mg total) by mouth every 12 (twelve) hours as needed for moderate pain.  [name] Kitchen triamterene-hydrochlorothiazide (MAXZIDE-25) 37.5-25 MG tablet Take 1 tablet by mouth daily.  [name] Kitchen zolpidem (AMBIEN) 10 MG tablet TAKE 1 TABLET(10 MG) BY MOUTH AT BEDTIME AS NEEDED  FOR SLEEP   Facility-Administered Encounter Medications as of 10/09/2020  Medication  . methylPREDNISolone acetate (DEPO-MEDROL) injection 80 mg    Allergies (verified) Codeine and Ace inhibitors   History: Past Medical History:  Diagnosis Date  . Achilles tendinitis    Left leg  . Borderline diabetes mellitus   . Childhood asthma   . Degenerative disc disease, lumbar    rec against surgery  . Erectile dysfunction   . GI bleed 12/2015   hospitalization  . Gout   . Hypertension   . Ileus ([place]) 12/2015   hospitalization   . Male hypogonadism    followed by urology on T injections ([place])  . Seasonal allergies    pollen  . Severe obesity (BMI >= 40) ([facility])   . Shoulder bursitis   . Sleep apnea    CPAP   Past Surgical History:  Procedure Laterality Date  . ADENOIDECTOMY    . CARPAL TUNNEL RELEASE Right 2015  . CATARACT EXTRACTION W/PHACO Right 07/15/2016   Procedure: CATARACT EXTRACTION PHACO AND INTRAOCULAR LENS PLACEMENT (IOC);  Surgeon: Leandrew Koyanagi, MD;  Location: [facility];  Service: Ophthalmology;  Laterality: Right;  TORIC sleep apnea  . CATARACT EXTRACTION W/PHACO Left 08/05/2016   Procedure: CATARACT EXTRACTION PHACO AND INTRAOCULAR LENS PLACEMENT (IOC) toric;  Surgeon: Leandrew Koyanagi, MD;  Location: [facility];  Service: Ophthalmology;  Laterality: Left;  left sleep apnea toric  . COLONOSCOPY  10/2012   rpt 5 yrs ([facility])  . EYE SURGERY     Lens replacement 07/12/2016,08/05/2016  . LUMBAR EPIDURAL INJECTION Bilateral 04/2017, 12/2017   S1 transforaminal ESI ([place])  . RHINOPLASTY    . TONSILLECTOMY  ?1954  . TRIGGER FINGER RELEASE Right 2015   4th   Family History  Problem Relation Age of Onset  . Alcohol abuse Father   . Hypertension Father   . AVM Father 37       hemorrhagic stroke  . Diabetes Paternal Grandmother   . Cancer Neg Hx   . CAD Neg Hx   . Prostate cancer Neg Hx   . Bladder Cancer Neg Hx   . Kidney  cancer Neg Hx    Social History   Socioeconomic History  . Marital status: Married    Spouse name: Not on file  . Number of children: Not on file  . Years of education: Not on file  . Highest education level: Not on file  Occupational History  . Not on file  Tobacco Use  . Smoking status: Former Smoker    Quit date: 07/27/1978    Years since quitting: 42.2  . Smokeless tobacco: Never Used  . Tobacco comment: Quit in 1980  Vaping Use  . Vaping Use: Never used  Substance and Sexual Activity  . Alcohol use: Yes    Alcohol/week: 5.0 standard drinks    Types: 5 Shots of liquor per week    Comment:    . Drug use: No  . Sexual activity: Not on file  Other Topics Concern  . Not on file  Social History Narrative   Lives at home with wife [name]), dog  passed away 2017   1 grown child   Occ: retired Clinical biochemist, was Chief Technology Officer    Edu: 2 yrs college   Activity: no regular exercise   Diet: good water, fruits daily, splenda iced tea   Social Determinants of Health   Financial Resource Strain: Low Risk   . Difficulty of Paying Living Expenses: Not hard at all  Food Insecurity: No Food Insecurity  . Worried About Charity fundraiser in the Last Year: Never true  . Ran Out of Food in the Last Year: Never true  Transportation Needs: No Transportation Needs  . Lack of Transportation (Medical): No  . Lack of Transportation (Non-Medical): No  Physical Activity: Inactive  . Days of Exercise per Week: 0 days  . Minutes of Exercise per Session: 0 min  Stress: No Stress Concern Present  . Feeling of Stress : Not at all  Social Connections: Not on file    Tobacco Counseling Counseling given: Not Answered Comment: Quit in 1980   Clinical Intake:  Pre-visit preparation completed: Yes  Pain : 0-10 Pain Type: Chronic pain Pain Location: Jaw Pain Orientation: Right Pain Descriptors / Indicators: Aching Pain Onset: More than a month ago Pain Frequency: Intermittent      Nutritional Risks: None Diabetes: Yes CBG done?: No Did pt. bring in CBG monitor from home?: No  How often do you need to have someone help you when you read instructions, pamphlets, or other written materials from your doctor or pharmacy?: 1 - Never  Diabetic: Yes Nutrition Risk Assessment:  Has the patient had any N/V/D within the last 2 months?  No  Does the patient have any non-healing wounds?  No  Has the patient had any unintentional weight loss or weight gain?  No   Diabetes:  Is the patient diabetic?  Yes  If diabetic, was a CBG obtained today?  No  telephone visit  Did the patient bring in their glucometer from home?  No  telephone visit  How often do you monitor your CBG's? never.   Financial Strains and Diabetes Management:  Are you having any financial strains with the device, your supplies or your medication? No .  Does the patient want to be seen by Chronic Care Management for management of their diabetes?  No  Would the patient like to be referred to a Nutritionist or for Diabetic Management?  No   Diabetic Exams:  Diabetic Eye Exam: Completed 04/15/2020 Diabetic Foot Exam: Overdue, Pt has been advised about the importance in completing this exam. Pt is scheduled for diabetic foot exam on 10/16/2020.   Interpreter Needed?: No  Information entered by :: CJohnson, LPN   Activities of Daily Living In your present state of health, do you have any difficulty performing the following activities: 10/09/2020  Hearing? N  Vision? N  Difficulty concentrating or making decisions? Y  Comment short term memory loss noticed per patient  Walking or climbing stairs? N  Dressing or bathing? N  Doing errands, shopping? N  Preparing Food and eating ? N  Using the Toilet? N  In the past six months, have you accidently leaked urine? N  Do you have problems with loss of bowel control? N  Managing your Medications? N  Managing your Finances? N  Housekeeping or managing  your Housekeeping? N  Some recent data might be hidden    Patient Care Team: [name], MD as PCP - General (Family Medicine) Emmaline Kluver., MD (Rheumatology) [place],  Nila Nephew, MD as Referring Physician (Ophthalmology) Beverly Gust, MD (Otolaryngology) Debbora Dus, [facility] as Pharmacist (Pharmacist)  Indicate any recent Medical Services you may have received from other than [facility] providers in the past year (date may be approximate).     Assessment:   This is a routine wellness examination for Jarold.  Hearing/Vision screen  Hearing Screening   125Hz  250Hz  500Hz  1000Hz  2000Hz  3000Hz  4000Hz  6000Hz  8000Hz   Right ear:           Left ear:           Vision Screening Comments: Patient gets annual eye exams   Dietary issues and exercise activities discussed: Current Exercise Habits: The patient does not participate in regular exercise at present, Exercise limited by: None identified  Goals    . Patient Stated     Starting 07/05/2018, I will continue to take medications as prescribed.     . Patient Stated     07/25/2019, I will maintain and continue medications as prescribed.     . Patient Stated     10/09/2020, I will maintain and continue medications as prescribed.     . Pharmacy Care Plan     .Current Barriers:  . Chronic Disease Management support, education, and care coordination needs related to hypertension, hyperlipidemia, hypogonadism, allergic rhinitis, COPD, diabetic retinopathy, gout, osteoarthritis, pre-diabetes, chronic back/shoulder pain, insomnia  Pharmacist Clinical Goal(s):  [name] Kitchen Pharmacist will review medication safety every 6 months . Maintain blood pressure within goal of < 140/90 mmHg . Maintain A1c within goal of < 7% . Increase exercise slowly with goal of 150 minutes of moderate activity (such as brisk walking) each week  Interventions: . Comprehensive medication review performed. . Coordinate adherence packaging and medication  delivery . Reduce amlodipine prescription from twice daily to once daily (patient is already taking this way)  Patient Self Care Activities:  . Self-administers medications as prescribed . Self monitors vital signs, including blood pressure, heart rate, and pulse oximetry  Initial goal documentation        Depression Screen PHQ 2/9 Scores 10/09/2020 07/25/2019 07/05/2018 07/01/2017 03/30/2017  PHQ - 2 Score 0 0 0 0 0  PHQ- 9 Score 0 0 0 0 -    Fall Risk Fall Risk  10/09/2020 07/25/2019 07/05/2018 07/01/2017 03/30/2017  Falls in the past year? 0 1 0 No No  Comment - tripped and fell - - -  Number falls in past yr: 0 0 - - -  Injury with Fall? 0 0 - - -  Risk for fall due to : Medication side effect Medication side effect - - -  Follow up Falls evaluation completed;Falls prevention discussed Falls evaluation completed;Falls prevention discussed - - -    FALL RISK PREVENTION PERTAINING TO THE HOME:  Any stairs in or around the home? Yes  If so, are there any without handrails? No  Home free of loose throw rugs in walkways, pet beds, electrical cords, etc? Yes  Adequate lighting in your home to reduce risk of falls? Yes   ASSISTIVE DEVICES UTILIZED TO PREVENT FALLS:  Life alert? No  Use of a cane, walker or w/c? No  Grab bars in the bathroom? No  Shower chair or bench in shower? No  Elevated toilet seat or a handicapped toilet? No   TIMED UP AND GO:  Was the test performed? N/A telephone visit .    Cognitive Function: MMSE - Mini Mental State Exam 10/09/2020 07/25/2019 07/05/2018 07/01/2017  Not completed: Refused - - -  Orientation to time - 5 5 5   Orientation to Place - 5 5 5   Registration - 3 3 3   Attention/ Calculation - 5 0 0  Recall - 3 3 1   Recall-comments - - - unable to recall 2 of 3 words  Language- name 2 objects - - 0 0  Language- repeat - 1 1 1   Language- follow 3 step command - - 3 3  Language- read & follow direction - - 0 0  Write a sentence - - 0 0   Copy design - - 0 0  Total score - - 20 18  Mini Cog  Mini-Cog screen was not completed. Patient did not feel the need to do this right now. Maximum score is 22. A value of 0 denotes this part of the MMSE was not completed or the patiefailed this part of the Mini-Cog screening.        Immunizations Immunization History  Administered Date(s) Administered  . Influenza, High Dose Seasonal PF 05/14/2017, 04/07/2018, 04/18/2020  . Influenza, Quadrivalent, Recombinant, Inj, Pf 03/20/2019, 06/12/2019  . Influenza-Unspecified 08/27/2016  . Moderna Sars-Covid-2 Vaccination 09/08/2019, 10/06/2019, 05/20/2020  . Pneumococcal Conjugate-13 08/23/2014, 03/20/2019  . Pneumococcal Polysaccharide-23 03/26/2016  . Td 08/23/2014  . Tdap 08/23/2014  . Zoster 08/08/2013  . Zoster Recombinat (Shingrix) 03/14/2018, 05/16/2018    TDAP status: Up to date  Flu Vaccine status: Up to date  Pneumococcal vaccine status: Up to date  Covid-19 vaccine status: Completed vaccines  Qualifies for Shingles Vaccine? Yes   Zostavax completed Yes   Shingrix Completed?: Yes  Screening Tests Health Maintenance  Topic Date Due  . FOOT EXAM  Never done  . HEMOGLOBIN A1C  11/29/2019  . URINE MICROALBUMIN  07/26/2020  . Fecal DNA (Cologuard)  01/30/2021  . OPHTHALMOLOGY EXAM  04/15/2021  . TETANUS/TDAP  08/23/2024  . INFLUENZA VACCINE  Completed  . COVID-19 Vaccine  Completed  . Hepatitis C Screening  Completed  . PNA vac Low Risk Adult  Completed  . HPV VACCINES  Aged Out    Health Maintenance  Health Maintenance Due  Topic Date Due  . FOOT EXAM  Never done  . HEMOGLOBIN A1C  11/29/2019  . URINE MICROALBUMIN  07/26/2020    Colorectal cancer screening: Type of screening: Cologuard. Completed 01/30/2018. Repeat every 3 years  Lung Cancer Screening: (Low Dose CT Chest recommended if Age 29-80 years, 30 pack-year currently smoking OR have quit w/in 15years.) does not qualify.    Additional  Screening:  Hepatitis C Screening: does qualify; Completed 12/25/2016  Vision Screening: Recommended annual ophthalmology exams for early detection of glaucoma and other disorders of the eye. Is the patient up to date with their annual eye exam?  Yes  Who is the provider or what is the name of the office in which the patient attends annual eye exams? Dr. Wallace Going  If pt is not established with a provider, would they like to be referred to a provider to establish care? No .   Dental Screening: Recommended annual dental exams for proper oral hygiene  Community Resource Referral / Chronic Care Management: CRR required this visit?  No   CCM required this visit?  No      Plan:     I have personally reviewed and noted the following in the patient's chart:   . Medical and social history . Use of alcohol, tobacco or illicit drugs  . Current medications and supplements . Functional ability and status . Nutritional status .  Physical activity . Advanced directives . List of other physicians . Hospitalizations, surgeries, and ER visits in previous 12 months . Vitals . Screenings to include cognitive, depression, and falls . Referrals and appointments  In addition, I have reviewed and discussed with patient certain preventive protocols, quality metrics, and best practice recommendations. A written personalized care plan for preventive services as well as general preventive health recommendations were provided to patient.   Due to this being a telephonic visit, the after visit summary with patients personalized plan was offered to patient via office or my-chart. Patient preferred to pick up at office at next visit or via mychart.   Andrez Grime, LPN   [id number]

## 2020-10-09 NOTE — Progress Notes (Signed)
PCP notes:  Health Maintenance: Foot exam- due   Abnormal Screenings: none   Patient concerns: none   Nurse concerns: none   Next PCP appt.: 10/16/2020 @ 2:30 pm

## 2020-10-10 ENCOUNTER — Other Ambulatory Visit: Payer: Self-pay | Admitting: Family Medicine

## 2020-10-10 ENCOUNTER — Other Ambulatory Visit (INDEPENDENT_AMBULATORY_CARE_PROVIDER_SITE_OTHER): Payer: PPO

## 2020-10-10 ENCOUNTER — Other Ambulatory Visit: Payer: Self-pay

## 2020-10-10 DIAGNOSIS — R7303 Prediabetes: Secondary | ICD-10-CM

## 2020-10-10 DIAGNOSIS — M1A00X0 Idiopathic chronic gout, unspecified site, without tophus (tophi): Secondary | ICD-10-CM

## 2020-10-10 DIAGNOSIS — E785 Hyperlipidemia, unspecified: Secondary | ICD-10-CM

## 2020-10-10 DIAGNOSIS — E1169 Type 2 diabetes mellitus with other specified complication: Secondary | ICD-10-CM

## 2020-10-10 DIAGNOSIS — I10 Essential (primary) hypertension: Secondary | ICD-10-CM

## 2020-10-10 LAB — COMPREHENSIVE METABOLIC PANEL
ALT: 24 U/L (ref 0–53)
AST: 18 U/L (ref 0–37)
Albumin: 4.1 g/dL (ref 3.5–5.2)
Alkaline Phosphatase: 50 U/L (ref 39–117)
BUN: 19 mg/dL (ref 6–23)
CO2: 30 mEq/L (ref 19–32)
Calcium: 9 mg/dL (ref 8.4–10.5)
Chloride: 99 mEq/L (ref 96–112)
Creatinine, Ser: 1.21 mg/dL (ref 0.40–1.50)
GFR: 59.8 mL/min — ABNORMAL LOW (ref >60.00)
Glucose, Bld: 179 mg/dL — ABNORMAL HIGH (ref 70–99)
Potassium: 4.1 mEq/L (ref 3.5–5.1)
Sodium: 137 mEq/L (ref 135–145)
Total Bilirubin: 0.8 mg/dL (ref 0.2–1.2)
Total Protein: 6.4 g/dL (ref 6.0–8.3)

## 2020-10-10 LAB — LIPID PANEL
Cholesterol: 101 mg/dL (ref 0–200)
HDL: 45 mg/dL (ref >39.00)
LDL Cholesterol: 39 mg/dL (ref 0–99)
NonHDL: 56.28
Total CHOL/HDL Ratio: 2
Triglycerides: 86 mg/dL (ref 0.0–149.0)
VLDL: 17.2 mg/dL (ref 0.0–40.0)

## 2020-10-10 LAB — HEMOGLOBIN A1C: Hgb A1c MFr Bld: 6.5 % (ref 4.6–6.5)

## 2020-10-10 LAB — URIC ACID: Uric Acid, Serum: 5.4 mg/dL (ref 4.0–7.8)

## 2020-10-11 ENCOUNTER — Ambulatory Visit: Payer: Self-pay | Admitting: Urology

## 2020-10-16 ENCOUNTER — Encounter: Payer: Self-pay | Admitting: Family Medicine

## 2020-10-16 ENCOUNTER — Other Ambulatory Visit: Payer: Self-pay

## 2020-10-16 ENCOUNTER — Ambulatory Visit (INDEPENDENT_AMBULATORY_CARE_PROVIDER_SITE_OTHER): Payer: PPO | Admitting: Family Medicine

## 2020-10-16 VITALS — BP 144/82 | HR 63 | Temp 97.2°F | Ht 69.25 in | Wt 269.4 lb

## 2020-10-16 DIAGNOSIS — J449 Chronic obstructive pulmonary disease, unspecified: Secondary | ICD-10-CM

## 2020-10-16 DIAGNOSIS — I10 Essential (primary) hypertension: Secondary | ICD-10-CM | POA: Diagnosis not present

## 2020-10-16 DIAGNOSIS — E113299 Type 2 diabetes mellitus with mild nonproliferative diabetic retinopathy without macular edema, unspecified eye: Secondary | ICD-10-CM | POA: Diagnosis not present

## 2020-10-16 DIAGNOSIS — Z0000 Encounter for general adult medical examination without abnormal findings: Secondary | ICD-10-CM | POA: Diagnosis not present

## 2020-10-16 DIAGNOSIS — Z7189 Other specified counseling: Secondary | ICD-10-CM | POA: Diagnosis not present

## 2020-10-16 DIAGNOSIS — I340 Nonrheumatic mitral (valve) insufficiency: Secondary | ICD-10-CM | POA: Diagnosis not present

## 2020-10-16 DIAGNOSIS — G4733 Obstructive sleep apnea (adult) (pediatric): Secondary | ICD-10-CM

## 2020-10-16 DIAGNOSIS — E291 Testicular hypofunction: Secondary | ICD-10-CM | POA: Diagnosis not present

## 2020-10-16 DIAGNOSIS — M1A9XX0 Chronic gout, unspecified, without tophus (tophi): Secondary | ICD-10-CM

## 2020-10-16 DIAGNOSIS — E1169 Type 2 diabetes mellitus with other specified complication: Secondary | ICD-10-CM

## 2020-10-16 DIAGNOSIS — E785 Hyperlipidemia, unspecified: Secondary | ICD-10-CM

## 2020-10-16 DIAGNOSIS — M1A00X0 Idiopathic chronic gout, unspecified site, without tophus (tophi): Secondary | ICD-10-CM | POA: Diagnosis not present

## 2020-10-16 DIAGNOSIS — R7303 Prediabetes: Secondary | ICD-10-CM | POA: Diagnosis not present

## 2020-10-16 DIAGNOSIS — R519 Headache, unspecified: Secondary | ICD-10-CM

## 2020-10-16 LAB — MICROALBUMIN / CREATININE URINE RATIO
Creatinine,U: 43.6 mg/dL
Microalb Creat Ratio: 15.5 mg/g (ref 0.0–30.0)
Microalb, Ur: 6.7 mg/dL — ABNORMAL HIGH (ref 0.0–1.9)

## 2020-10-16 MED ORDER — TIZANIDINE HCL 4 MG PO TABS: 2.0000 mg | ORAL_TABLET | Freq: Three times a day (TID) | ORAL | 0 refills | Status: DC | PRN

## 2020-10-16 NOTE — Assessment & Plan Note (Signed)
Continue CPAP use

## 2020-10-16 NOTE — Progress Notes (Signed)
Patient ID: Aaron Mitchell, male    DOB: 02-Aug-1945, 75 y.o.   MRN: [id number]  This visit was conducted in person.  BP (!) 144/82 (BP Location: Left Arm, Patient Position: Sitting, Cuff Size: Normal)   Pulse 63   Temp (!) 97.2 F (36.2 C) (Temporal)   Ht 5' 9.25" (1.759 m)   Wt 269 lb 6 oz (122.2 kg)   SpO2 97%   BMI 39.49 kg/m   BP Readings from Last 3 Encounters:  10/16/20 (!) 144/82  10/04/20 (!) 160/93  09/13/20 138/76    CC: CPE Subjective:   HPI: Aaron Mitchell is a 75 y.o. male presenting on 10/16/2020 for Annual Exam   Saw health advisor last week for medicare wellness visit. Note reviewed.    No exam data present  Flowsheet Row Clinical Support from 10/09/2020 in [place] [facility]  PHQ-2 Total Score 0      Fall Risk  10/09/2020 07/25/2019 07/05/2018 07/01/2017 03/30/2017  Falls in the past year? 0 1 0 No No  Comment - tripped and fell - - -  Number falls in past yr: 0 0 - - -  Injury with Fall? 0 0 - - -  Risk for fall due to : Medication side effect Medication side effect - - -  Follow up Falls evaluation completed;Falls prevention discussed Falls evaluation completed;Falls prevention discussed - - -   Having trouble with insurance coverage of metaxalone muscle relaxant for acute flares of chronic R jaw pain thought due to TMJ dysfunction. Baclofen and tizanidine are tier 1 per insurance. Flexeril ineffective. Saw Dr Tami Ribas ENT for R TMJ dysfunction - placed on prednisone taper with significant benefit.   Sees urology for hypogonadism on TRT.   Preventative: COLONOSCOPY4/2014rpt 5 yrs ([facility]). Cologuard 01/2018 WNL. Prostate cancer screening -followed byurologist ([place])  Lung cancer screening -ex smoker quit 1980  Flu shotyearly COVID vaccine Moderna 08/2019, 31-Oct-2019, booster 04/2020 Td 2014-10-31  prevnar 10-31-2014, pneumovax Oct 31, 2015  zostavax 10-30-2013 shingrix -completed 04/2018 Advanced directive - scanned and in chart 08/2018. Would  want wife Aaron Mitchell then Aaron Mitchell to be HCPOA. Does not want prolonged life support if terminal condition. Ok with feeding tube short term.  Seat belt use discussed  Sunscreen use and skin screen discussed Ex smoker quit remotely Aaron Mitchell. 2.5 ppd x 15 yrs Alcohol - 3 mixed drinks/wk  Dentist q6 mo  Eye exam yearly  Bowel - no constipation  Bladder - no incontinence   Lives at home with wife Aaron Mitchell), dog passed away 10-31-15 1 grown child Occ: retired Clinical biochemist, was Chief Technology Officer  Edu: 2 yrs college Activity: no regular exercise  Diet: good water,fruitsdaily, splenda, iced teaand coffee      Relevant past medical, surgical, family and social history reviewed and updated as indicated. Interim medical history since our last visit reviewed. Allergies and medications reviewed and updated. Outpatient Medications Prior to Visit  Medication Sig Dispense Refill  . albuterol (PROVENTIL) (2.5 MG/3ML) 0.083% nebulizer solution Take 3 mLs (2.5 mg total) by nebulization every 6 (six) hours as needed for wheezing or shortness of breath. 90 mL 3  . albuterol (VENTOLIN HFA) 108 (90 Base) MCG/ACT inhaler Inhale 2 puffs into the lungs every 6 (six) hours as needed for wheezing or shortness of breath. 3 Inhaler 3  . allopurinol (ZYLOPRIM) 300 MG tablet Take 1 tablet (300 mg total) by mouth daily. 90 tablet 0  . amLODipine (NORVASC) 5 MG tablet Take 1 tablet (5 mg  total) by mouth daily. 90 tablet 3  . atorvastatin (LIPITOR) 40 MG tablet Take 1 tablet (40 mg total) by mouth daily. 90 tablet 0  . diclofenac (VOLTAREN) 75 MG EC tablet Take 1 tablet (75 mg total) by mouth 2 (two) times daily. 180 tablet 0  . diclofenac sodium (VOLTAREN) 1 % GEL Apply 2 g topically daily as needed. Apply to both hands.    . fluticasone (FLONASE) 50 MCG/ACT nasal spray SHAKE LIQUID AND USE 2 SPRAYS IN EACH NOSTRIL DAILY 48 g 1  . metaxalone (SKELAXIN) 800 MG tablet Take 1 tablet (800 mg total) by mouth 3 (three) times daily as  needed for muscle spasms. 90 tablet 0  . Multiple Vitamins tablet Take 1 tablet by mouth daily.    . naproxen sodium (ANAPROX) 220 MG tablet Take 220 mg by mouth daily.     . sildenafil (VIAGRA) 100 MG tablet Take by mouth.    . tadalafil (CIALIS) 20 MG tablet Take 20 mg by mouth daily as needed for erectile dysfunction.    [name] Kitchen testosterone cypionate (DEPOTESTOSTERONE CYPIONATE) 200 MG/ML injection Inject 0.5 mLs (100 mg total) into the muscle once a week. 10 mL 2  . tiotropium (SPIRIVA HANDIHALER) 18 MCG inhalation capsule Place 1 capsule (18 mcg total) into inhaler and inhale daily. 90 capsule 3  . traMADol (ULTRAM) 50 MG tablet Take 1 tablet (50 mg total) by mouth every 12 (twelve) hours as needed for moderate pain. 30 tablet 0  . triamterene-hydrochlorothiazide (MAXZIDE-25) 37.5-25 MG tablet Take 1 tablet by mouth daily. 90 tablet 0  . zolpidem (AMBIEN) 10 MG tablet TAKE 1 TABLET(10 MG) BY MOUTH AT BEDTIME AS NEEDED FOR SLEEP 30 tablet 0   Facility-Administered Medications Prior to Visit  Medication Dose Route Frequency Provider Last Rate Last Admin  . methylPREDNISolone acetate (DEPO-MEDROL) injection 80 mg  80 mg Intra-articular Once [name], Spencer, MD         Per HPI unless specifically indicated in ROS section below Review of Systems  Constitutional: Positive for appetite change (prednisone). Negative for activity change, chills, fatigue, fever and unexpected weight change.  HENT: Positive for congestion (allergy related) and postnasal drip. Negative for hearing loss.   Eyes: Negative for visual disturbance.  Respiratory: Negative for cough, chest tightness, shortness of breath and wheezing.   Cardiovascular: Negative for chest pain, palpitations and leg swelling.  Gastrointestinal: Negative for abdominal distention, abdominal pain, blood in stool, constipation, diarrhea, nausea and vomiting.  Genitourinary: Negative for difficulty urinating and hematuria.  Musculoskeletal: Negative  for arthralgias, myalgias and neck pain.  Skin: Negative for rash.  Neurological: Negative for dizziness, seizures, syncope and headaches.  Hematological: Negative for adenopathy. Does not bruise/bleed easily.  Psychiatric/Behavioral: Positive for dysphoric mood. The patient is nervous/anxious.        Mild   Objective:  BP (!) 144/82 (BP Location: Left Arm, Patient Position: Sitting, Cuff Size: Normal)   Pulse 63   Temp (!) 97.2 F (36.2 C) (Temporal)   Ht 5' 9.25" (1.759 m)   Wt 269 lb 6 oz (122.2 kg)   SpO2 97%   BMI 39.49 kg/m   Wt Readings from Last 3 Encounters:  10/16/20 269 lb 6 oz (122.2 kg)  10/04/20 270 lb (122.5 kg)  09/13/20 270 lb (122.5 kg)      Physical Exam Vitals and nursing note reviewed.  Constitutional:      General: He is not in acute distress.    Appearance: Normal appearance. He is  well-developed. He is not ill-appearing.  HENT:     Head: Normocephalic and atraumatic.     Right Ear: Hearing, tympanic membrane, ear canal and external ear normal.     Left Ear: Hearing, tympanic membrane, ear canal and external ear normal.     Nose: Nose normal.  Eyes:     General: No scleral icterus.    Conjunctiva/sclera: Conjunctivae normal.     Pupils: Pupils are equal, round, and reactive to light.  Neck:     Thyroid: No thyroid mass or thyromegaly.     Vascular: No carotid bruit.  Cardiovascular:     Rate and Rhythm: Normal rate and regular rhythm.     Pulses: Normal pulses.          Radial pulses are 2+ on the right side and 2+ on the left side.     Heart sounds: Murmur (2/6 systolic USB) heard.    Pulmonary:     Effort: Pulmonary effort is normal. No respiratory distress.     Breath sounds: Normal breath sounds. No wheezing, rhonchi or rales.  Abdominal:     General: Bowel sounds are normal. There is no distension.     Palpations: Abdomen is soft. There is no mass.     Tenderness: There is no abdominal tenderness. There is no guarding or rebound.      Hernia: No hernia is present.  Musculoskeletal:        General: Normal range of motion.     Cervical back: Normal range of motion and neck supple.     Right lower leg: Edema (tr) present.     Left lower leg: Edema (tr) present.  Lymphadenopathy:     Cervical: No cervical adenopathy.  Skin:    General: Skin is warm and dry.     Capillary Refill: Capillary refill takes less than 2 seconds.     Findings: No rash.  Neurological:     General: No focal deficit present.     Mental Status: He is alert and oriented to person, place, and time.     Comments: CN grossly intact, station and gait intact  Psychiatric:        Mood and Affect: Mood normal.        Behavior: Behavior normal.        Thought Content: Thought content normal.        Judgment: Judgment normal.       Results for orders placed or performed in visit on 10/10/20  Uric acid  Result Value Ref Range   Uric Acid, Serum 5.4 4.0 - 7.8 mg/dL  Hemoglobin A1c  Result Value Ref Range   Hgb A1c MFr Bld 6.5 4.6 - 6.5 %  Comprehensive metabolic panel  Result Value Ref Range   Sodium 137 135 - 145 mEq/L   Potassium 4.1 3.5 - 5.1 mEq/L   Chloride 99 96 - 112 mEq/L   CO2 30 19 - 32 mEq/L   Glucose, Bld 179 (H) 70 - 99 mg/dL   BUN 19 6 - 23 mg/dL   Creatinine, Ser 1.21 0.40 - 1.50 mg/dL   Total Bilirubin 0.8 0.2 - 1.2 mg/dL   Alkaline Phosphatase 50 39 - 117 U/L   AST 18 0 - 37 U/L   ALT 24 0 - 53 U/L   Total Protein 6.4 6.0 - 8.3 g/dL   Albumin 4.1 3.5 - 5.2 g/dL   GFR 59.80 (L) >60.00 mL/min   Calcium 9.0 8.4 - 10.5 mg/dL  Lipid panel  Result Value Ref Range   Cholesterol 101 0 - 200 mg/dL   Triglycerides 86.0 0.0 - 149.0 mg/dL   HDL 45.00 >39.00 mg/dL   VLDL 17.2 0.0 - 40.0 mg/dL   LDL Cholesterol 39 0 - 99 mg/dL   Total CHOL/HDL Ratio 2    NonHDL 56.28    Lab Results  Component Value Date   PSA1 1.0 09/30/2020   PSA1 1.0 09/20/2019   PSA1 0.9 09/06/2018     Depression screen PHQ 2/9 10/09/2020 07/25/2019  07/05/2018 07/01/2017 03/30/2017  Decreased Interest 0 0 0 0 0  Down, Depressed, Hopeless 0 0 0 0 0  PHQ - 2 Score 0 0 0 0 0  Altered sleeping 0 0 0 0 -  Tired, decreased energy 0 0 0 0 -  Change in appetite 0 0 0 0 -  Feeling bad or failure about yourself  0 0 0 0 -  Trouble concentrating 0 0 0 0 -  Moving slowly or fidgety/restless 0 0 0 0 -  Suicidal thoughts 0 0 0 0 -  PHQ-9 Score 0 0 0 0 -  Difficult doing work/chores Not difficult at all Not difficult at all Not difficult at all Not difficult at all -  No flowsheet data found.  Assessment & Plan:  This visit occurred during the SARS-CoV-2 public health emergency.  Safety protocols were in place, including screening questions prior to the visit, additional usage of staff PPE, and extensive cleaning of exam room while observing appropriate contact time as indicated for disinfecting solutions.   Problem List Items Addressed This Visit    Prediabetes    A1c has increased into diabetes range - encouraged renewed efforts to follow diabetic diet. Recheck in 6 months.       Chronic gouty arthropathy    Urate stable on 300mg  allopurinol daily.       Relevant Medications   tiZANidine (ZANAFLEX) 4 MG tablet   Essential (primary) hypertension    Chronic, mildly elevated today. No changes made.       Hyperlipidemia associated with type 2 diabetes mellitus (HCC)    Chronic, stable. Continue atorvastatin. The ASCVD Risk score Mikey Bussing DC Jr., et al., 2013) failed to calculate for the following reasons:   The valid total cholesterol range is 130 to 320 mg/dL       Severe obesity (BMI 35.0-39.9) with comorbidity ([place])    Encouraged healthy diet and lifestyle choices to affect sustainable weight loss.       OSA (obstructive sleep apnea)    Continue CPAP use.       Male hypogonadism    Sees urology yearly.       Moderate mitral insufficiency    Stable period.       Stage 3 severe COPD by GOLD classification (HCC)    Stable period  on spiriva.       Health maintenance examination - Primary    Preventative protocols reviewed and updated unless pt declined. Discussed healthy diet and lifestyle.       Advanced care planning/counseling discussion    Advanced directive - scanned and in chart 08/2018. Would want wife Aaron Mitchell then Aaron Mitchell to be HCPOA. Does not want prolonged life support if terminal condition. Ok with feeding tube short term.       Background diabetic retinopathy ([place])    Sees eye doctor yearly.      Right facial pain    Saw ENT, appreciate their care. Thought TMJ  dysfunction, pending MRI. PRN muscle relaxant effective.           Meds ordered this encounter  Medications  . tiZANidine (ZANAFLEX) 4 MG tablet    Sig: Take 0.5-1 tablets (2-4 mg total) by mouth 3 (three) times daily as needed for muscle spasms (R jaw pain - sedation precautions).    Dispense:  40 tablet    Refill:  0    To replace skelaxin   No orders of the defined types were placed in this encounter.   Patient instructions: Watch sugars as they were in controlled diabetes range today.  You are doing well today  Return as needed or in 6 months for sugar (diabetes) follow up visit.   Follow up plan: Return in about 6 months (around 04/18/2021) for follow up visit.  [name], MD

## 2020-10-16 NOTE — Assessment & Plan Note (Signed)
Chronic, mildly elevated today. No changes made.

## 2020-10-16 NOTE — Assessment & Plan Note (Signed)
Stable period.  

## 2020-10-16 NOTE — Patient Instructions (Addendum)
Watch sugars as they were in controlled diabetes range today.  You are doing well today  Return as needed or in 6 months for sugar (diabetes) follow up visit.   Health Maintenance After Age 73 After age 57, you are at a higher risk for certain long-term diseases and infections as well as injuries from falls. Falls are a major cause of broken bones and head injuries in people who are older than age 42. Getting regular preventive care can help to keep you healthy and well. Preventive care includes getting regular testing and making lifestyle changes as recommended by your health care provider. Talk with your health care provider about:  Which screenings and tests you should have. A screening is a test that checks for a disease when you have no symptoms.  A diet and exercise plan that is right for you. What should I know about screenings and tests to prevent falls? Screening and testing are the best ways to find a health problem early. Early diagnosis and treatment give you the best chance of managing medical conditions that are common after age 1. Certain conditions and lifestyle choices may make you more likely to have a fall. Your health care provider may recommend:  Regular vision checks. Poor vision and conditions such as cataracts can make you more likely to have a fall. If you wear glasses, make sure to get your prescription updated if your vision changes.  Medicine review. Work with your health care provider to regularly review all of the medicines you are taking, including over-the-counter medicines. Ask your health care provider about any side effects that may make you more likely to have a fall. Tell your health care provider if any medicines that you take make you feel dizzy or sleepy.  Osteoporosis screening. Osteoporosis is a condition that causes the bones to get weaker. This can make the bones weak and cause them to break more easily.  Blood pressure screening. Blood pressure changes  and medicines to control blood pressure can make you feel dizzy.  Strength and balance checks. Your health care provider may recommend certain tests to check your strength and balance while standing, walking, or changing positions.  Foot health exam. Foot pain and numbness, as well as not wearing proper footwear, can make you more likely to have a fall.  Depression screening. You may be more likely to have a fall if you have a fear of falling, feel emotionally low, or feel unable to do activities that you used to do.  Alcohol use screening. Using too much alcohol can affect your balance and may make you more likely to have a fall. What actions can I take to lower my risk of falls? General instructions  Talk with your health care provider about your risks for falling. Tell your health care provider if: ? You fall. Be sure to tell your health care provider about all falls, even ones that seem minor. ? You feel dizzy, sleepy, or off-balance.  Take over-the-counter and prescription medicines only as told by your health care provider. These include any supplements.  Eat a healthy diet and maintain a healthy weight. A healthy diet includes low-fat dairy products, low-fat (lean) meats, and fiber from whole grains, beans, and lots of fruits and vegetables. Home safety  Remove any tripping hazards, such as rugs, cords, and clutter.  Install safety equipment such as grab bars in bathrooms and safety rails on stairs.  Keep rooms and walkways well-lit. Activity  Follow a regular exercise  program to stay fit. This will help you maintain your balance. Ask your health care provider what types of exercise are appropriate for you.  If you need a cane or walker, use it as recommended by your health care provider.  Wear supportive shoes that have nonskid soles.   Lifestyle  Do not drink alcohol if your health care provider tells you not to drink.  If you drink alcohol, limit how much you  have: ? 0-1 drink a day for women. ? 0-2 drinks a day for men.  Be aware of how much alcohol is in your drink. In the [place] one drink equals one typical bottle of beer (12 oz), one-half glass of wine (5 oz), or one shot of hard liquor (1 oz).  Do not use any products that contain nicotine or tobacco, such as cigarettes and e-cigarettes. If you need help quitting, ask your health care provider. Summary  Having a healthy lifestyle and getting preventive care can help to protect your health and wellness after age 3.  Screening and testing are the best way to find a health problem early and help you avoid having a fall. Early diagnosis and treatment give you the best chance for managing medical conditions that are more common for people who are older than age 70.  Falls are a major cause of broken bones and head injuries in people who are older than age 58. Take precautions to prevent a fall at home.  Work with your health care provider to learn what changes you can make to improve your health and wellness and to prevent falls. This information is not intended to replace advice given to you by your health care provider. Make sure you discuss any questions you have with your health care provider. Document Revised: 11/03/2018 Document Reviewed: 05/26/2017 Elsevier Patient Education  2021 [employer].

## 2020-10-16 NOTE — Assessment & Plan Note (Signed)
Urate stable on 300mg  allopurinol daily.

## 2020-10-16 NOTE — Assessment & Plan Note (Signed)
Chronic, stable. Continue atorvastatin  The ASCVD Risk score (Goff DC Jr., et al., 2013) failed to calculate for the following reasons:   The valid total cholesterol range is 130 to 320 mg/dL  

## 2020-10-16 NOTE — Assessment & Plan Note (Signed)
Stable period on spiriva.

## 2020-10-16 NOTE — Assessment & Plan Note (Signed)
Preventative protocols reviewed and updated unless pt declined. Discussed healthy diet and lifestyle.  

## 2020-10-16 NOTE — Assessment & Plan Note (Signed)
A1c has increased into diabetes range - encouraged renewed efforts to follow diabetic diet. Recheck in 6 months.

## 2020-10-16 NOTE — Assessment & Plan Note (Signed)
Sees urology yearly.

## 2020-10-16 NOTE — Assessment & Plan Note (Signed)
Advanced directive - scanned and in chart 08/2018. Would want wife Janice then [name] to be HCPOA. Does not want prolonged life support if terminal condition. Ok with feeding tube short term.  

## 2020-10-16 NOTE — Assessment & Plan Note (Signed)
Saw ENT, appreciate their care. Thought TMJ dysfunction, pending MRI. PRN muscle relaxant effective.

## 2020-10-16 NOTE — Assessment & Plan Note (Signed)
See's eye doctor yearly

## 2020-10-16 NOTE — Assessment & Plan Note (Signed)
Encouraged healthy diet and lifestyle choices to affect sustainable weight loss.  ?

## 2020-10-21 ENCOUNTER — Other Ambulatory Visit: Payer: PPO

## 2020-10-24 ENCOUNTER — Ambulatory Visit
Admission: RE | Admit: 2020-10-24 | Discharge: 2020-10-24 | Disposition: A | Discharge status: Home / Self Care | Payer: PPO | Source: Ambulatory Visit | Attending: Unknown Physician Specialty | Admitting: Unknown Physician Specialty

## 2020-10-24 ENCOUNTER — Other Ambulatory Visit: Payer: Self-pay

## 2020-10-24 DIAGNOSIS — G319 Degenerative disease of nervous system, unspecified: Secondary | ICD-10-CM | POA: Diagnosis not present

## 2020-10-24 DIAGNOSIS — G501 Atypical facial pain: Secondary | ICD-10-CM | POA: Diagnosis not present

## 2020-10-24 DIAGNOSIS — R519 Headache, unspecified: Secondary | ICD-10-CM | POA: Diagnosis not present

## 2020-10-24 MED ORDER — GADOBUTROL 1 MMOL/ML IV SOLN
10.0000 mL | Freq: Once | INTRAVENOUS | Status: AC | PRN
  Administered 2020-10-24: 10 mL via INTRAVENOUS

## 2020-10-28 ENCOUNTER — Encounter: Payer: Self-pay | Admitting: Family Medicine

## 2020-10-29 DIAGNOSIS — M5126 Other intervertebral disc displacement, lumbar region: Secondary | ICD-10-CM | POA: Diagnosis not present

## 2020-10-29 DIAGNOSIS — M5416 Radiculopathy, lumbar region: Secondary | ICD-10-CM | POA: Diagnosis not present

## 2020-10-31 ENCOUNTER — Other Ambulatory Visit: Payer: Self-pay

## 2020-10-31 ENCOUNTER — Encounter: Payer: Self-pay | Admitting: Family Medicine

## 2020-10-31 ENCOUNTER — Ambulatory Visit (INDEPENDENT_AMBULATORY_CARE_PROVIDER_SITE_OTHER): Payer: PPO | Admitting: Family Medicine

## 2020-10-31 VITALS — BP 100/60 | HR 44 | Temp 97.8°F | Ht 69.25 in | Wt 274.8 lb

## 2020-10-31 DIAGNOSIS — M7062 Trochanteric bursitis, left hip: Secondary | ICD-10-CM | POA: Diagnosis not present

## 2020-10-31 DIAGNOSIS — M170 Bilateral primary osteoarthritis of knee: Secondary | ICD-10-CM

## 2020-10-31 MED ORDER — TRIAMCINOLONE ACETONIDE 40 MG/ML IJ SUSP
20.0000 mg | Freq: Once | INTRAMUSCULAR | Status: AC
  Administered 2020-10-31: 20 mg via INTRA_ARTICULAR

## 2020-10-31 MED ORDER — TRIAMCINOLONE ACETONIDE 40 MG/ML IJ SUSP
40.0000 mg | Freq: Once | INTRAMUSCULAR | Status: AC
  Administered 2020-10-31: 40 mg via INTRA_ARTICULAR

## 2020-10-31 NOTE — Progress Notes (Signed)
    Suzanna Zahn T. Kaliyah Gladman, MD, [place] at [facility] [place], [address]  Phone: [phone]  FAX: [phone]  Aaron Mitchell - 75 y.o. male  MRN [id number]  Date of Birth: 08-24-1945  Date: 10/31/2020  PCP: [name], MD  Referral: [name], MD  Chief Complaint  Patient presents with  . Knee Pain    Bilateral Knee Injections  . Hip Pain    Left    This visit occurred during the SARS-CoV-2 public health emergency.  Safety protocols were in place, including screening questions prior to the visit, additional usage of staff PPE, and extensive cleaning of exam room while observing appropriate contact time as indicated for disinfecting solutions.    Inject off and on for a number years for different musculoskeletal and joint complaints.  Right now he has some bilateral knee pain and to a greater extent his left lateral hip.  Aspiration/Injection Procedure Note Aaron Mitchell 07/21/1946 Date of procedure: 10/31/2020  Procedure: Large Joint Joint Aspiration / Injection of the Right Knee Indications: Pain  Procedure Details Patient verbally consented to procedure. Risks, benefits, and alternatives explained. Sterilely prepped with Chloraprep. Ethyl cholride used for anesthesia. 5 cc Lidocaine 1% mixed with Kenalog 20 mg injected using the anteromedial approach without difficulty. No complications with procedure and tolerated well. Patient had decreased pain post-injection.  Medication: 1 mL of Kenalog 20 mg  Aspiration/Injection Procedure Note Aaron Mitchell 06/01/1946 Date of procedure: 10/31/2020  Procedure: Large Joint Aspiration / Injection of the Left Knee Indications: Pain  Procedure Details Patient verbally consented to procedure. Risks, benefits, and alternatives explained. Sterilely prepped with Chloraprep. Ethyl cholride used for anesthesia. 5 cc Lidocaine  1% mixed with Kenalog 20 mg injected using the anteromedial approach without difficulty. No complications with procedure and tolerated well. Patient had decreased pain post-injection.  Medication: 1 mL of Kenalog 20 mg  Aspiration/Injection Procedure Note Aaron Mitchell 07-18-1946 Date of procedure: 10/31/2020  Procedure: Large Joint Aspiration / Injection of Hip, Trochanteric Bursa, L Indications: Pain  Procedure Details Verbal consent obtained. Risks, benefits, and alternatives reviewed. Greater trochanter sterilely prepped with Chloraprep. Ethyl Chloride used for anesthesia. 9 cc of Lidocaine 1% injected with 1 mL of Kenalog 40 mg into trochanteric bursa at area of maximal tenderness at greater trochanter. Needle taken to bone to troch bursa, flows easily. Bursa massaged. No bleeding and no complications. Decreased pain after injection. Needle: 22 gauge spinal needle Medication: 1 mL of Kenalog 40 mg    ICD-10-CM   1. Primary osteoarthritis of knees, bilateral  M17.0 triamcinolone acetonide (KENALOG-40) injection 20 mg    triamcinolone acetonide (KENALOG-40) injection 20 mg  2. Trochanteric bursitis of left hip  M70.62 triamcinolone acetonide (KENALOG-40) injection 40 mg    Meds ordered this encounter  Medications  . triamcinolone acetonide (KENALOG-40) injection 40 mg  . triamcinolone acetonide (KENALOG-40) injection 20 mg  . triamcinolone acetonide (KENALOG-40) injection 20 mg    Signed,  Nayana Lenig T. Marvie Calender, MD

## 2020-11-05 ENCOUNTER — Other Ambulatory Visit: Payer: Self-pay

## 2020-11-05 MED ORDER — AMLODIPINE BESYLATE 5 MG PO TABS: 5.0000 mg | ORAL_TABLET | Freq: Every day | ORAL | 3 refills | Status: DC

## 2020-11-05 NOTE — Telephone Encounter (Signed)
Received faxed refill request from [facility].  E-scribed refill.

## 2020-11-07 ENCOUNTER — Telehealth: Payer: Self-pay

## 2020-11-07 NOTE — Chronic Care Management (AMB) (Addendum)
Chronic Care Management Pharmacy Assistant   Name: Aaron Mitchell  MRN: [id number] DOB: 09-18-1945  Reason for Encounter: Medication Review - Medication adherence and delivery coordination    Recent office visits:  10/31/2020 - Dr.[name] - Start triamcinolone cream 10/16/2020 - Dr.Gutierrez, PCP - Start tizanidine   Recent consult visits:  10/29/2020 - Aaron Salina, DO 10/04/2020 - Dr.Stoioff, Urology   Hospital visits:  None in previous 6 months  Medications: Outpatient Encounter Medications as of 11/07/2020  Medication Sig   albuterol (PROVENTIL) (2.5 MG/3ML) 0.083% nebulizer solution Take 3 mLs (2.5 mg total) by nebulization every 6 (six) hours as needed for wheezing or shortness of breath.   albuterol (VENTOLIN HFA) 108 (90 Base) MCG/ACT inhaler Inhale 2 puffs into the lungs every 6 (six) hours as needed for wheezing or shortness of breath.   allopurinol (ZYLOPRIM) 300 MG tablet Take 1 tablet (300 mg total) by mouth daily.   amLODipine (NORVASC) 5 MG tablet Take 1 tablet (5 mg total) by mouth daily.   atorvastatin (LIPITOR) 40 MG tablet Take 1 tablet (40 mg total) by mouth daily.   diclofenac (VOLTAREN) 75 MG EC tablet Take 1 tablet (75 mg total) by mouth 2 (two) times daily.   diclofenac sodium (VOLTAREN) 1 % GEL Apply 2 g topically daily as needed. Apply to both hands.   fluticasone (FLONASE) 50 MCG/ACT nasal spray SHAKE LIQUID AND USE 2 SPRAYS IN EACH NOSTRIL DAILY   meloxicam (MOBIC) 15 MG tablet Take 1 tablet by mouth daily.   metaxalone (SKELAXIN) 800 MG tablet Take 1 tablet (800 mg total) by mouth 3 (three) times daily as needed for muscle spasms.   Multiple Vitamins tablet Take 1 tablet by mouth daily.   naproxen sodium (ANAPROX) 220 MG tablet Take 220 mg by mouth daily.    sildenafil (VIAGRA) 100 MG tablet Take by mouth.   tadalafil (CIALIS) 20 MG tablet Take 20 mg by mouth daily as needed for erectile dysfunction.   testosterone cypionate (DEPOTESTOSTERONE  CYPIONATE) 200 MG/ML injection Inject 0.5 mLs (100 mg total) into the muscle once a week.   tiotropium (SPIRIVA HANDIHALER) 18 MCG inhalation capsule Place 1 capsule (18 mcg total) into inhaler and inhale daily.   tiZANidine (ZANAFLEX) 4 MG tablet Take 0.5-1 tablets (2-4 mg total) by mouth 3 (three) times daily as needed for muscle spasms (R jaw pain - sedation precautions).   traMADol (ULTRAM) 50 MG tablet Take 1 tablet (50 mg total) by mouth every 12 (twelve) hours as needed for moderate pain.   triamterene-hydrochlorothiazide (MAXZIDE-25) 37.5-25 MG tablet Take 1 tablet by mouth daily.   zolpidem (AMBIEN) 10 MG tablet TAKE 1 TABLET(10 MG) BY MOUTH AT BEDTIME AS NEEDED FOR SLEEP   Facility-Administered Encounter Medications as of 11/07/2020  Medication   methylPREDNISolone acetate (DEPO-MEDROL) injection 80 mg   BP Readings from Last 3 Encounters:  10/31/20 100/60  10/16/20 (!) 144/82  10/04/20 (!) 160/93    Lab Results  Component Value Date   HGBA1C 6.5 10/10/2020     Patient obtains medications through Adherence Packaging  90 Days   Last adherence delivery date: 08/16/2020, 90 DS  Patient is due for next adherence delivery on: 11/15/2020  Spoke with patient on 11/08/2020 and reviewed medications and coordinated delivery.  This delivery to include: Adherence Packaging  90 Days  Packs:  Allopurinol 300 mg 1 tablet before breakfast  Amlodipine 5 mg 1 tablet before breakfast  Atorvastatin 40 mg 1 tablet before breakfast  Triamterene  37.5 mg-HCTZ 25 mg 1 tablet  before breakfast  Vials:  Diclofenac 75 mg 1 tablet twice daily  fluticasone (FLONASE) 50 MCG/ACT nasal spray use 2 sprays daily  Patient declined the following medications this month:  Spiriva 18 mcg- Place 1 capsule into inhaler and inhale daily  Testosterone cypionate 200 mg/ml- Inject 0.5 ml IM once weekly   Flonase due to PRN use  Tramadol 50 mg due to PRN use   Zolpidem 10 mg- 1 tablet at bedtime as needed- due  to PRN use  Tizanidine 4 mg - due to PRN use  Confirmed delivery date of 11/15/2020 advised patient that pharmacy will contact him the morning of delivery.  Recent blood pressure readings are as follows: none  Recent blood glucose readings are as follows: nonw  Pt. Needs refills from Dr.Gutierrez:  Allopurinol 300 mg 1 tablet before breakfast  Atorvastatin 40 mg 1 tablet before breakfast  Triamterene 37.5 mg-HCTZ 25 mg 1 tablet  before breakfast  Follow-Up:  Comptroller and Pharmacist Review  Debbora Dus, CPP notified  Avel Sensor, [place] Assistant [phone]  I have reviewed the care management and care coordination activities outlined in this encounter and I am certifying that I agree with the content of this note. No further action required.  Debbora Dus, PharmD Clinical Pharmacist [facility] [phone]

## 2020-11-10 ENCOUNTER — Encounter: Payer: Self-pay | Admitting: Family Medicine

## 2020-11-11 ENCOUNTER — Other Ambulatory Visit: Payer: Self-pay

## 2020-11-11 ENCOUNTER — Ambulatory Visit (INDEPENDENT_AMBULATORY_CARE_PROVIDER_SITE_OTHER): Payer: PPO | Admitting: Family Medicine

## 2020-11-11 ENCOUNTER — Ambulatory Visit (INDEPENDENT_AMBULATORY_CARE_PROVIDER_SITE_OTHER)
Admission: RE | Admit: 2020-11-11 | Discharge: 2020-11-11 | Disposition: A | Discharge status: Home / Self Care | Payer: PPO | Source: Ambulatory Visit | Attending: Family Medicine | Admitting: Family Medicine

## 2020-11-11 ENCOUNTER — Ambulatory Visit: Payer: PPO | Admitting: Family Medicine

## 2020-11-11 ENCOUNTER — Encounter: Payer: Self-pay | Admitting: Family Medicine

## 2020-11-11 VITALS — BP 128/68 | HR 63 | Temp 97.9°F | Ht 69.25 in | Wt 282.1 lb

## 2020-11-11 DIAGNOSIS — J449 Chronic obstructive pulmonary disease, unspecified: Secondary | ICD-10-CM | POA: Diagnosis not present

## 2020-11-11 DIAGNOSIS — I700 Atherosclerosis of aorta: Secondary | ICD-10-CM | POA: Diagnosis not present

## 2020-11-11 DIAGNOSIS — R600 Localized edema: Secondary | ICD-10-CM | POA: Insufficient documentation

## 2020-11-11 DIAGNOSIS — I10 Essential (primary) hypertension: Secondary | ICD-10-CM

## 2020-11-11 DIAGNOSIS — R0600 Dyspnea, unspecified: Secondary | ICD-10-CM | POA: Diagnosis not present

## 2020-11-11 DIAGNOSIS — I340 Nonrheumatic mitral (valve) insufficiency: Secondary | ICD-10-CM | POA: Diagnosis not present

## 2020-11-11 DIAGNOSIS — E1169 Type 2 diabetes mellitus with other specified complication: Secondary | ICD-10-CM | POA: Diagnosis not present

## 2020-11-11 DIAGNOSIS — R0602 Shortness of breath: Secondary | ICD-10-CM | POA: Diagnosis not present

## 2020-11-11 DIAGNOSIS — G4733 Obstructive sleep apnea (adult) (pediatric): Secondary | ICD-10-CM

## 2020-11-11 DIAGNOSIS — R918 Other nonspecific abnormal finding of lung field: Secondary | ICD-10-CM | POA: Diagnosis not present

## 2020-11-11 DIAGNOSIS — R9431 Abnormal electrocardiogram [ECG] [EKG]: Secondary | ICD-10-CM

## 2020-11-11 DIAGNOSIS — E785 Hyperlipidemia, unspecified: Secondary | ICD-10-CM

## 2020-11-11 MED ORDER — FUROSEMIDE 20 MG PO TABS: 20.0000 mg | ORAL_TABLET | Freq: Every day | ORAL | 0 refills | Status: DC

## 2020-11-11 NOTE — Assessment & Plan Note (Signed)
New associated with weight gain and pedal edema - anticipate acute CHF exacerbation in setting of multiple recent steroid injections. Will start lasix 20mg  daily x 5 days in place of maxzide. Check BNP, TnI and EKG (with associated chest discomfort). Overdue for cardiology f/u - advised call and schedule appt.

## 2020-11-11 NOTE — Telephone Encounter (Signed)
[facility] - Client TELEPHONE ADVICE RECORD AccessNurse Patient Name: Aaron Mitchell Gender: Male DOB: 24-Oct-1945 Age: 75 Y 50 M 17 D Return Phone Number: [phone] (Primary) Address: City/ State/ Zip: [place] [address] Client [facility] - Client Client Site [place] Physician [name] - MD Contact Type Call Who Is Calling Patient / Member / Family / Caregiver Call Type Triage / Clinical Relationship To Patient Self Return Phone Number [phone] (Primary) Chief Complaint BREATHING - shortness of breath or sounds breathless Reason for Call Symptomatic / Request for [name] states he is having shortness of breath.Swelling to legs Has appt at 3 today transferred from office Translation No Nurse Assessment Nurse: Ysidro Evert, RN, Levada Dy Date/Time (Eastern Time): 11/11/2020 9:53:09 AM Confirm and document reason for call. If symptomatic, describe symptoms. ---Caller states he is having shortness of breath and he has swelling in his legs and feet. Symptoms started last Thursday. No fever Does the patient have any new or worsening symptoms? ---Yes Will a triage be completed? ---Yes Related visit to physician within the last 2 weeks? ---No Does the PT have any chronic conditions? (i.e. diabetes, asthma, this includes High risk factors for pregnancy, etc.) ---Yes List chronic conditions. ---diabetes, asthma, copd Is this a behavioral health or substance abuse call? ---No Guidelines Guideline Title Affirmed Question Affirmed Notes Nurse Date/Time (Eastern Time) Breathing Difficulty [1] MODERATE difficulty breathing (e.g., speaks in phrases, SOB even at rest, pulse 100-120) AND [2] NEW-onset or WORSE than normal [name] 11/11/2020 9:55:39 AM PLEASE NOTE: All timestamps contained within this report are represented as  [place] Standard Time. CONFIDENTIALTY NOTICE: This fax transmission is intended only for the addressee. It contains information that is legally privileged, confidential or otherwise protected from use or disclosure. If you are not the intended recipient, you are strictly prohibited from reviewing, disclosing, copying using or disseminating any of this information or taking any action in reliance on or regarding this information. If you have received this fax in error, please notify us immediately by telephone so that we can arrange for its return to [place]. Phone: [phone], Toll-Free: [phone], Fax: [phone] Page: 2 of 2 Call Id: [id number] [place]. Time Eilene Ghazi Time) Disposition Final User 11/11/2020 9:51:24 AM Send to Urgent Queue Idolina Primer 11/11/2020 9:59:37 AM Go to ED Now Yes Ysidro Evert, RN, Marin Shutter Disagree/Comply Disagree Caller Understands Yes PreDisposition Did not know what to do Care Advice Given Per Guideline GO TO ED NOW: * You need to be seen in the Emergency Department. * Another adult should drive. CALL EMS 911 IF: * Call EMS if you become worse. CARE ADVICE given per Breathing Difficulty (Adult) guideline. Referrals GO TO FACILITY REFUSED

## 2020-11-11 NOTE — Telephone Encounter (Addendum)
I spoke with pt; pt said for 3 days having swelling and bloated all over; No CP and no redness or pain in lower extremities; just swelling per pt; pt said his abd is not swollen. Pt is taking all meds as instructed. No covid symptoms other than SOB which is same as usual with COPD: then pt said SOB could be slightly worse. Pt is refusing to go to UC or ED; pt said he has appt to be seen today at 3:40 and plans on keeping that appt. UC & ED precautions given again and pt voiced understanding.sending note to Dr Lorelei Pont who pt has appt with this afternoon, Butch Penny CMA and Dr Darnell Level.

## 2020-11-11 NOTE — Assessment & Plan Note (Signed)
Anticipate CHF related. Check BNP, start lasix, update over mychart with effect in 3-5 days.

## 2020-11-11 NOTE — Telephone Encounter (Signed)
Seen today. 

## 2020-11-11 NOTE — Telephone Encounter (Addendum)
Raquel Sarna H just informed me that Dr Lorelei Pont had to leave the office and I spoke with pt and his wife(DPR signed) and pt said he is not going to ED or UC and wants appt on 11/12/20; I spoke with Dr Darnell Level and he will see pt on 11/12/20 at 12:30.  UC & ED precautions given and pts wife voiced understanding. Sending note to DR G.

## 2020-11-11 NOTE — Telephone Encounter (Signed)
Plz triage pt.  °

## 2020-11-11 NOTE — Progress Notes (Signed)
Patient ID: Aaron Mitchell, male    DOB: 08-Sep-1945, 75 y.o.   MRN: [id number]  This visit was conducted in person.  BP 128/68   Pulse 63   Temp 97.9 F (36.6 C) (Temporal)   Ht 5' 9.25" (1.759 m)   Wt 282 lb 1 oz (127.9 kg)   SpO2 98%   BMI 41.35 kg/m    CC: dyspnea, leg swelling  Subjective:   HPI: Aaron Mitchell is a 75 y.o. male presenting on 11/11/2020 for Shortness of Breath (C/o SOB, LE swelling, fatigue and feels bloated all over.  Sxs started 11/07/20.)   4d h/o "retaining fluid, bloated from top to bottom". 13 lb weight gain in the pats month, 8 lbs in the past 2 weeks. Legs feel heavy when walking. Notes increased dyspnea. He took an albuterol neb this morning with improvement. Some substernal chest pressure/tighntess yesterday and earlier this morning - he states this is similar to his COPD "lung tightness" feel.   No nausea/vomiting, bowel changes, new dizziness, lightheadedness. No orthopnea.   Known COPD on spiriva daily with albuterol PRN.  Recent bilateral knee injection and L hip injection with total 80mg  kenalog (10/31/2020).  He also just received bilateral S1 TFESI with dexamethasone 10mg  (10/29/2020)  Last saw [facility] cardiology Dr Clayborn Bigness 2018 - wants to see different provider - will call to schedule.  Stress test 12/2016 - overall normal perfusion scan with EF 50%.      Relevant past medical, surgical, family and social history reviewed and updated as indicated. Interim medical history since our last visit reviewed. Allergies and medications reviewed and updated. Outpatient Medications Prior to Visit  Medication Sig Dispense Refill  . albuterol (PROVENTIL) (2.5 MG/3ML) 0.083% nebulizer solution Take 3 mLs (2.5 mg total) by nebulization every 6 (six) hours as needed for wheezing or shortness of breath. 90 mL 3  . albuterol (VENTOLIN HFA) 108 (90 Base) MCG/ACT inhaler Inhale 2 puffs into the lungs every 6 (six) hours as needed for wheezing or shortness of  breath. 3 Inhaler 3  . allopurinol (ZYLOPRIM) 300 MG tablet Take 1 tablet (300 mg total) by mouth daily. 90 tablet 0  . amLODipine (NORVASC) 5 MG tablet Take 1 tablet (5 mg total) by mouth daily. 90 tablet 3  . atorvastatin (LIPITOR) 40 MG tablet Take 1 tablet (40 mg total) by mouth daily. 90 tablet 0  . diclofenac (VOLTAREN) 75 MG EC tablet Take 1 tablet (75 mg total) by mouth 2 (two) times daily. 180 tablet 0  . diclofenac sodium (VOLTAREN) 1 % GEL Apply 2 g topically daily as needed. Apply to both hands.    . fluticasone (FLONASE) 50 MCG/ACT nasal spray SHAKE LIQUID AND USE 2 SPRAYS IN EACH NOSTRIL DAILY 48 g 1  . meloxicam (MOBIC) 15 MG tablet Take 1 tablet by mouth daily.    . metaxalone (SKELAXIN) 800 MG tablet Take 1 tablet (800 mg total) by mouth 3 (three) times daily as needed for muscle spasms. 90 tablet 0  . Multiple Vitamins tablet Take 1 tablet by mouth daily.    . naproxen sodium (ANAPROX) 220 MG tablet Take 220 mg by mouth daily.     . sildenafil (VIAGRA) 100 MG tablet Take by mouth.    . tadalafil (CIALIS) 20 MG tablet Take 20 mg by mouth daily as needed for erectile dysfunction.    [name] Kitchen testosterone cypionate (DEPOTESTOSTERONE CYPIONATE) 200 MG/ML injection Inject 0.5 mLs (100 mg total) into the muscle once a week.  10 mL 2  . tiotropium (SPIRIVA HANDIHALER) 18 MCG inhalation capsule Place 1 capsule (18 mcg total) into inhaler and inhale daily. 90 capsule 3  . tiZANidine (ZANAFLEX) 4 MG tablet Take 0.5-1 tablets (2-4 mg total) by mouth 3 (three) times daily as needed for muscle spasms (R jaw pain - sedation precautions). 40 tablet 0  . traMADol (ULTRAM) 50 MG tablet Take 1 tablet (50 mg total) by mouth every 12 (twelve) hours as needed for moderate pain. 30 tablet 0  . triamterene-hydrochlorothiazide (MAXZIDE-25) 37.5-25 MG tablet Take 1 tablet by mouth daily. 90 tablet 0  . zolpidem (AMBIEN) 10 MG tablet TAKE 1 TABLET(10 MG) BY MOUTH AT BEDTIME AS NEEDED FOR SLEEP 30 tablet 0    Facility-Administered Medications Prior to Visit  Medication Dose Route Frequency Provider Last Rate Last Admin  . methylPREDNISolone acetate (DEPO-MEDROL) injection 80 mg  80 mg Intra-articular Once [name], Spencer, MD         Per HPI unless specifically indicated in ROS section below Review of Systems Objective:  BP 128/68   Pulse 63   Temp 97.9 F (36.6 C) (Temporal)   Ht 5' 9.25" (1.759 m)   Wt 282 lb 1 oz (127.9 kg)   SpO2 98%   BMI 41.35 kg/m   Wt Readings from Last 3 Encounters:  11/11/20 282 lb 1 oz (127.9 kg)  10/31/20 274 lb 12 oz (124.6 kg)  10/16/20 269 lb 6 oz (122.2 kg)      Physical Exam Vitals and nursing note reviewed.  Constitutional:      Appearance: Normal appearance. He is obese. He is not ill-appearing.  Cardiovascular:     Rate and Rhythm: Normal rate and regular rhythm.     Pulses: Normal pulses.     Heart sounds: Murmur (3/6 systolic best at apex) heard.    Pulmonary:     Effort: Pulmonary effort is normal. No respiratory distress.     Breath sounds: No wheezing, rhonchi or rales.     Comments: RLL crackles Musculoskeletal:     Right lower leg: Edema (1+ to mid calf) present.     Left lower leg: Edema (1+ to mid calf) present.     Comments:  1+ pedal edema at dorsal feet as well Diminished pulses due to swelling but still present bilaterally   Skin:    General: Skin is warm and dry.     Findings: No rash.  Neurological:     Mental Status: He is alert.  Psychiatric:        Mood and Affect: Mood normal.        Behavior: Behavior normal.       Results for orders placed or performed in visit on 10/16/20  Microalbumin / creatinine urine ratio  Result Value Ref Range   Microalb, Ur 6.7 (H) 0.0 - 1.9 mg/dL   Creatinine,U 43.6 mg/dL   Microalb Creat Ratio 15.5 0.0 - 30.0 mg/g   EKG - no clear p waves seen, regular rate 60s, LAD with LBBB, ?ST elevation anteroseptally vs LBBB related.  Assessment & Plan:  This visit occurred during  the SARS-CoV-2 public health emergency.  Safety protocols were in place, including screening questions prior to the visit, additional usage of staff PPE, and extensive cleaning of exam room while observing appropriate contact time as indicated for disinfecting solutions.   Problem List Items Addressed This Visit    Essential (primary) hypertension   Relevant Medications   furosemide (LASIX) 20 MG tablet  Hyperlipidemia associated with type 2 diabetes mellitus (HCC)   Severe obesity (BMI 35.0-39.9) with comorbidity ([place])    12 lb weight gain noted in 1 month - see below.       OSA (obstructive sleep apnea)   Moderate mitral insufficiency   Relevant Medications   furosemide (LASIX) 20 MG tablet   Stage 3 severe COPD by GOLD classification (HCC)   Shortness of breath - Primary    New associated with weight gain and pedal edema - anticipate acute CHF exacerbation in setting of multiple recent steroid injections. Will start lasix 20mg  daily x 5 days in place of maxzide. Check BNP, TnI and EKG (with associated chest discomfort). Overdue for cardiology f/u - advised call and schedule appt.       Relevant Orders   DG Chest 2 View   CBC with Differential/Platelet   Basic metabolic panel   TSH   Brain natriuretic peptide   EKG 12-Lead (Completed)   Troponin I (High Sensitivity)   Ambulatory referral to Cardiology   Pedal edema    Anticipate CHF related. Check BNP, start lasix, update over mychart with effect in 3-5 days.       Relevant Orders   CBC with Differential/Platelet   Basic metabolic panel   TSH   Brain natriuretic peptide   Ambulatory referral to Cardiology   Abnormal EKG    LBBB complicates interpretation.  Patient currently without chest pain/tightness.  Check Troponin.  Start aspirin, return to cardiology (last seen 2018).  To ER if recurrent chest pain/pressure/tightness.       Relevant Orders   Ambulatory referral to Cardiology       Meds ordered this  encounter  Medications  . furosemide (LASIX) 20 MG tablet    Sig: Take 1 tablet (20 mg total) by mouth daily for 5 days.    Dispense:  20 tablet    Refill:  0   Orders Placed This Encounter  Procedures  . DG Chest 2 View    Standing Status:   Future    Number of Occurrences:   1    Standing Expiration Date:   11/11/2021    Order Specific Question:   Reason for Exam (SYMPTOM  OR DIAGNOSIS REQUIRED)    Answer:   dyspnea    Order Specific Question:   Preferred imaging location?    Answer:   [facility]  . CBC with Differential/Platelet  . Basic metabolic panel  . TSH  . Brain natriuretic peptide  . Ambulatory referral to Cardiology    Referral Priority:   Routine    Referral Type:   Consultation    Referral Reason:   Specialty Services Required    Requested Specialty:   Cardiology    Number of Visits Requested:   1  . EKG 12-Lead    Patient Instructions  EKG today  Recent steroid injections may have contributed to current symptoms.  Start lasix 20mg  daily for 5 days - in place of maxzide (triamterene hydrochlorothiazide). Then may restart maxzide.  Labs and chest xray today  Call to schedule appointment with cardiology as you're overdue.    Follow up plan: Return if symptoms worsen or fail to improve.  [name], MD

## 2020-11-11 NOTE — Assessment & Plan Note (Signed)
12 lb weight gain noted in 1 month - see below.

## 2020-11-11 NOTE — Patient Instructions (Addendum)
EKG today  Recent steroid injections may have contributed to current symptoms.  Start lasix 20mg  daily for 5 days - in place of maxzide (triamterene hydrochlorothiazide). Then may restart maxzide.  Labs and chest xray today  Call to schedule appointment with cardiology as you're overdue.

## 2020-11-12 ENCOUNTER — Telehealth: Payer: Self-pay

## 2020-11-12 ENCOUNTER — Ambulatory Visit: Payer: PPO | Admitting: Family Medicine

## 2020-11-12 ENCOUNTER — Telehealth: Payer: Self-pay | Admitting: Radiology

## 2020-11-12 DIAGNOSIS — I447 Left bundle-branch block, unspecified: Secondary | ICD-10-CM | POA: Insufficient documentation

## 2020-11-12 DIAGNOSIS — R9431 Abnormal electrocardiogram [ECG] [EKG]: Secondary | ICD-10-CM | POA: Insufficient documentation

## 2020-11-12 LAB — CBC WITH DIFFERENTIAL/PLATELET
Basophils Absolute: 0.1 10*3/uL (ref 0.0–0.1)
Basophils Relative: 0.6 % (ref 0.0–3.0)
Eosinophils Absolute: 0.1 10*3/uL (ref 0.0–0.7)
Eosinophils Relative: 0.7 % (ref 0.0–5.0)
HCT: 46.8 % (ref 39.0–52.0)
Hemoglobin: 15.7 g/dL (ref 13.0–17.0)
Lymphocytes Relative: 8 % — ABNORMAL LOW (ref 12.0–46.0)
Lymphs Abs: 0.8 10*3/uL (ref 0.7–4.0)
MCHC: 33.5 g/dL (ref 30.0–36.0)
MCV: 91.1 fl (ref 78.0–100.0)
Monocytes Absolute: 1.1 10*3/uL — ABNORMAL HIGH (ref 0.1–1.0)
Monocytes Relative: 10.8 % (ref 3.0–12.0)
Neutro Abs: 8.1 10*3/uL — ABNORMAL HIGH (ref 1.4–7.7)
Neutrophils Relative %: 79.9 % — ABNORMAL HIGH (ref 43.0–77.0)
Platelets: 166 10*3/uL (ref 150.0–400.0)
RBC: 5.13 Mil/uL (ref 4.22–5.81)
RDW: 15.1 % (ref 11.5–15.5)
WBC: 10.1 10*3/uL (ref 4.0–10.5)

## 2020-11-12 LAB — BASIC METABOLIC PANEL
BUN: 20 mg/dL (ref 6–23)
CO2: 29 mEq/L (ref 19–32)
Calcium: 8.8 mg/dL (ref 8.4–10.5)
Chloride: 98 mEq/L (ref 96–112)
Creatinine, Ser: 1.45 mg/dL (ref 0.40–1.50)
GFR: 48.1 mL/min — ABNORMAL LOW (ref >60.00)
Glucose, Bld: 173 mg/dL — ABNORMAL HIGH (ref 70–99)
Potassium: 3.6 mEq/L (ref 3.5–5.1)
Sodium: 137 mEq/L (ref 135–145)

## 2020-11-12 LAB — BRAIN NATRIURETIC PEPTIDE: Pro B Natriuretic peptide (BNP): 228 pg/mL — ABNORMAL HIGH (ref 0.0–100.0)

## 2020-11-12 LAB — TROPONIN I (HIGH SENSITIVITY): High Sens Troponin I: 20 ng/L (ref 2–17)

## 2020-11-12 LAB — TSH: TSH: 2.19 u[IU]/mL (ref 0.35–4.50)

## 2020-11-12 MED ORDER — ATORVASTATIN CALCIUM 40 MG PO TABS: 40.0000 mg | ORAL_TABLET | Freq: Every day | ORAL | 3 refills | Status: DC

## 2020-11-12 MED ORDER — ALLOPURINOL 300 MG PO TABS: 300.0000 mg | ORAL_TABLET | Freq: Every day | ORAL | 3 refills | Status: DC

## 2020-11-12 MED ORDER — TRIAMTERENE-HCTZ 37.5-25 MG PO TABS: 1.0000 | ORAL_TABLET | Freq: Every day | ORAL | 3 refills | Status: DC

## 2020-11-12 NOTE — Telephone Encounter (Signed)
-----   Message from [place], [place] sent at 11/08/2020 11:45 AM EDT ----- Regarding: refills Contact: [phone]   Pt. Needs refills from [facility]:  Please send if appropriate.  Allopurinol 300 mg 1 tablet before breakfast  Atorvastatin 40 mg 1 tablet before breakfast  Triamterene 37.5 mg-HCTZ 25 mg 1 tablet  before breakfast   Velmena Humble CMA

## 2020-11-12 NOTE — Telephone Encounter (Signed)
Please call patient - cardiac enzyme returned slightly elevated, kidney function was a bit worse.  How is he feeling with starting lasix? Did he call cards for appt and how soon can we get him into cardiology?  If any chest pressure/tightness or worsening symptoms, recommend ER evaluation.

## 2020-11-12 NOTE — Assessment & Plan Note (Addendum)
LBBB complicates interpretation.  Patient currently without chest pain/tightness.  Check Troponin.  Start aspirin, return to cardiology (last seen 2018).  To ER if recurrent chest pain/pressure/tightness.

## 2020-11-12 NOTE — Telephone Encounter (Signed)
E-scribed refills to [facility].

## 2020-11-12 NOTE — Telephone Encounter (Signed)
[facility] called a critical result, Troponin 20, results given to Dr [name]

## 2020-11-12 NOTE — Telephone Encounter (Signed)
Left message on vm per dpr asking to call back with answers to Dr. Synthia Innocent questions.  Also, relayed Dr. Synthia Innocent message.

## 2020-11-14 ENCOUNTER — Encounter: Payer: Self-pay | Admitting: Family Medicine

## 2020-11-14 NOTE — Telephone Encounter (Signed)
Left message on vm per dpr asking to call back with answers to Dr. Synthia Innocent questions.  Also, relayed Dr. Synthia Innocent message.

## 2020-11-15 NOTE — Telephone Encounter (Signed)
Spoke with pt relaying Dr. Synthia Innocent message.  Pt verbalizes understanding and states Dr. Darnell Level is aware of cards appt.  Says he and Dr. Darnell Level are communicating via [place] while Dr. Darnell Level is out of the office.

## 2020-11-19 DIAGNOSIS — G4733 Obstructive sleep apnea (adult) (pediatric): Secondary | ICD-10-CM | POA: Diagnosis not present

## 2020-11-20 ENCOUNTER — Ambulatory Visit: Payer: PPO | Admitting: Family Medicine

## 2020-11-25 DIAGNOSIS — I10 Essential (primary) hypertension: Secondary | ICD-10-CM | POA: Diagnosis not present

## 2020-11-25 DIAGNOSIS — I447 Left bundle-branch block, unspecified: Secondary | ICD-10-CM | POA: Diagnosis not present

## 2020-11-25 DIAGNOSIS — E782 Mixed hyperlipidemia: Secondary | ICD-10-CM | POA: Diagnosis not present

## 2020-11-25 DIAGNOSIS — I480 Paroxysmal atrial fibrillation: Secondary | ICD-10-CM | POA: Diagnosis not present

## 2020-11-25 DIAGNOSIS — G4733 Obstructive sleep apnea (adult) (pediatric): Secondary | ICD-10-CM | POA: Diagnosis not present

## 2020-11-25 DIAGNOSIS — R0602 Shortness of breath: Secondary | ICD-10-CM | POA: Diagnosis not present

## 2020-12-09 ENCOUNTER — Telehealth: Payer: Self-pay

## 2020-12-09 ENCOUNTER — Telehealth (INDEPENDENT_AMBULATORY_CARE_PROVIDER_SITE_OTHER): Payer: PPO | Admitting: Family Medicine

## 2020-12-09 ENCOUNTER — Encounter: Payer: Self-pay | Admitting: Family Medicine

## 2020-12-09 VITALS — BP 150/60 | HR 64 | Temp 96.4°F | Ht 69.25 in | Wt 260.0 lb

## 2020-12-09 DIAGNOSIS — R059 Cough, unspecified: Secondary | ICD-10-CM | POA: Diagnosis not present

## 2020-12-09 DIAGNOSIS — R0781 Pleurodynia: Secondary | ICD-10-CM

## 2020-12-09 MED ORDER — AZITHROMYCIN 250 MG PO TABS: ORAL_TABLET | ORAL | 0 refills | Status: AC

## 2020-12-09 NOTE — Telephone Encounter (Signed)
That is great to hear!!!

## 2020-12-09 NOTE — Telephone Encounter (Signed)
Barbee Shropshire already spoke with pt and gave appt in office today at 11:40 with Dr Lorelei Pont. I spoke with pt; starting on 12/07/20 pt has upper rt side pain that is sharp on and off with movement or prod cough with yellow-brown phlegm at the base of rt ribs. Pt then said has had cough for few wks.  Pt has SOB with COPD which is no worse than usual. Pt does have head congestion and some wheezing when breathing out. Pt has no more body aches than usual.  Pt said was on a cruise 11/30/20 - 12/06/20. Pt recently in past wk or so had 4 neg covid test. The last test was done on 12/02/20. Pt said he wore a mask and social distanced on cruise. Pt has had Moderna vaccine 09/08/19; 10/06/19 and booster on 05/20/20. Pt will expect cb this morning to advise if can come into office or not. Sending note to Dr Lorelei Pont and Butch Penny CMA.

## 2020-12-09 NOTE — Telephone Encounter (Signed)
[facility] - Client Nonclinical Telephone Record Access[facility]urse Client [facility] - Client Client Site [place] Physician [name] - MD Contact Type Call Who Is Calling Patient / Member / Family / Caregiver Caller Name Amauri Keefe Caller Phone Number [phone] Patient Name Aaron Mitchell Patient DOB 07/22/1946 Call Type Message Only Information Provided Reason for Call Request to Schedule Office Appointment Initial Comment Caller states he wants to make an appointment for today if possible. Patient request to speak to RN No Additional Comment Provided office hours. Declined triage. Disp. Time Disposition Final User 12/09/2020 7:07:07 AM General Information Provided Yes Fifer, Levada Dy Call Closed By: Hilarie Fredrickson Transaction Date/Time: 12/09/2020 7:04:11 AM (ET)

## 2020-12-09 NOTE — Progress Notes (Signed)
Darwin Guastella T. Dorion Petillo, MD Primary Care and Sports Medicine [facility] at [facility] [place], [address] Phone: [phone]  FAX: [phone]  Denman Pichardo - 75 y.o. male  MRN [id number]  Date of Birth: May 03, 1946  Visit Date: 12/09/2020  PCP: [name], MD  Referred by: [name], MD  Virtual Visit via Video Note:  I connected with  Marquee Fuchs on 12/09/2020 11:40 AM EDT by a video enabled telemedicine application and verified that I am speaking with the correct person using two identifiers.   Location patient: home computer, tablet, or smartphone Location provider: work or home office Consent: Verbal consent directly obtained from [employer]. Persons participating in the virtual visit: patient, provider  I discussed the limitations of evaluation and management by telemedicine and the availability of in person appointments. The patient expressed understanding and agreed to proceed.  Chief Complaint  Patient presents with  . Flank Pain    History of Present Illness:  Just got off of a cruise 12/06/2020:  R sided chest pain off and on with a prod cough.  SOB and COPD does not seem to be worse than usual state.  On his chest x-ray from November 12, 2020, radiology was concerned about some peribronchial cuffing and interstitial markings for possible bronchitis.  His last home Covid test was on 12/02/2020.  He repeated 1 today at my urging, and this was normal.  UTD with Moderna vaccine x 3.  Cough is bothering him for 3 weeks.  He is having a sharp pain in his lower right chest area.  Had some pleurosy. Feels like the lower - right chest and in the side at the bottom of the ribs.  If he palpates it feels really sore and a sore muscle.   He has been having some wheezing, and he is not tried any albuterol. He does take Spiriva at baseline and is very compliant.  After recent multiple corticosteroid injections,  he did retain some fluid. So I did a total of 80 mg of Kenalog on October 31, 2020, and this was via knee and hip trochanteric bursa injection, and in addition he actually had 10 mg of dexamethasone on October 29, 2020.  This was for transforaminal ESI.  At that point his cardiac BNP was 228, and he has since seen saw cardiology.   DG Chest 2 View  Result Date: 11/12/2020 CLINICAL DATA:  75 year old male with history of dyspnea. EXAM: CHEST - 2 VIEW COMPARISON:  Chest x-ray 03/30/2017. FINDINGS: Mild diffuse interstitial prominence and peribronchial cuffing. Lung volumes are normal. No consolidative airspace disease. No pleural effusions. No pneumothorax. No pulmonary nodule or mass noted. Pulmonary vasculature and the cardiomediastinal silhouette are within normal limits. Atherosclerosis in the thoracic aorta. IMPRESSION: 1. Diffuse peribronchial cuffing and interstitial prominence concerning for an acute bronchitis. 2. Aortic atherosclerosis. Electronically Signed   By: Vinnie Langton M.D.   On: 11/12/2020 10:42     Review of Systems as above: See pertinent positives and pertinent negatives per HPI No acute distress verbally   Observations/Objective/Exam:  An attempt was made to discern vital signs over the phone and per patient if applicable and possible.   General:    Alert, Oriented, appears well and in no acute distress  Pulmonary:     On inspection no signs of respiratory distress.  Psych / Neurological:     Pleasant and cooperative.  Assessment and Plan:    ICD-10-CM  1. Pleuritic chest pain  R07.81   2. Cough  R05.9    I worry with his altered right-sided chest pain, productive cough for several weeks that this is developed into pneumonia.  There were some changes that might of been consistent with bronchitis/atypical several weeks ago.  I am getting give the patient some Zithromax to cover for pneumonia.  Also recommended that he use an albuterol as needed to help with his  wheezing.  We will avoid steroids in his case.  I discussed the assessment and treatment plan with the patient. The patient was provided an opportunity to ask questions and all were answered. The patient agreed with the plan and demonstrated an understanding of the instructions.   The patient was advised to call back or seek an in-person evaluation if the symptoms worsen or if the condition fails to improve as anticipated.  Follow-up: prn unless noted otherwise below No follow-ups on file.  Meds ordered this encounter  Medications  . azithromycin (ZITHROMAX) 250 MG tablet    Sig: Take 2 tablets (500 mg total) by mouth daily for 1 day, THEN 1 tablet (250 mg total) daily for 4 days.    Dispense:  6 tablet    Refill:  0   No orders of the defined types were placed in this encounter.   Signed,  Maud Deed. Danaja Lasota, MD

## 2020-12-09 NOTE — Telephone Encounter (Signed)
FYI... Pt called wanting to give you update following albuterol treatment... pt reports that the pain improved and the wheezing has improved as well...  Pt states you can send mychart msg if anything further is recommended.

## 2020-12-09 NOTE — Telephone Encounter (Signed)
Spoke to pt. He agreed to virtual visit. Stated he had a home Binax test he could do. I advised him sometimes the home tests were not as accurate and that we would most likely order a PCR test to send out.

## 2020-12-09 NOTE — Telephone Encounter (Signed)
Since he just got off of the cruise 5/13, then let's do a virtual and we can do testing for him this afternoon.

## 2020-12-11 ENCOUNTER — Ambulatory Visit: Payer: PPO | Admitting: Family Medicine

## 2020-12-12 DIAGNOSIS — I447 Left bundle-branch block, unspecified: Secondary | ICD-10-CM | POA: Diagnosis not present

## 2020-12-30 ENCOUNTER — Other Ambulatory Visit: Payer: Self-pay

## 2020-12-30 ENCOUNTER — Telehealth: Payer: Self-pay

## 2020-12-30 NOTE — Chronic Care Management (AMB) (Signed)
    Chronic Care Management Pharmacy Assistant   Name: Aaron Mitchell  MRN: [id number] DOB: 1946/02/25  Reason for Encounter: Medication Review- Refill request  Medications: Outpatient Encounter Medications as of 12/30/2020  Medication Sig  . albuterol (PROVENTIL) (2.5 MG/3ML) 0.083% nebulizer solution Take 3 mLs (2.5 mg total) by nebulization every 6 (six) hours as needed for wheezing or shortness of breath.  [name] Kitchen albuterol (VENTOLIN HFA) 108 (90 Base) MCG/ACT inhaler Inhale 2 puffs into the lungs every 6 (six) hours as needed for wheezing or shortness of breath.  . allopurinol (ZYLOPRIM) 300 MG tablet Take 1 tablet (300 mg total) by mouth daily.  [name] Kitchen amLODipine (NORVASC) 5 MG tablet Take 1 tablet (5 mg total) by mouth daily.  [name] Kitchen atorvastatin (LIPITOR) 40 MG tablet Take 1 tablet (40 mg total) by mouth daily.  . diclofenac (VOLTAREN) 75 MG EC tablet Take 1 tablet (75 mg total) by mouth 2 (two) times daily.  . diclofenac sodium (VOLTAREN) 1 % GEL Apply 2 g topically daily as needed. Apply to both hands.  . fluticasone (FLONASE) 50 MCG/ACT nasal spray SHAKE LIQUID AND USE 2 SPRAYS IN EACH NOSTRIL DAILY  . meloxicam (MOBIC) 15 MG tablet Take 1 tablet by mouth daily.  . metaxalone (SKELAXIN) 800 MG tablet Take 1 tablet (800 mg total) by mouth 3 (three) times daily as needed for muscle spasms.  . Multiple Vitamins tablet Take 1 tablet by mouth daily.  . naproxen sodium (ANAPROX) 220 MG tablet Take 220 mg by mouth daily.   . sildenafil (VIAGRA) 100 MG tablet Take by mouth.  . tadalafil (CIALIS) 20 MG tablet Take 20 mg by mouth daily as needed for erectile dysfunction.  [name] Kitchen testosterone cypionate (DEPOTESTOSTERONE CYPIONATE) 200 MG/ML injection Inject 0.5 mLs (100 mg total) into the muscle once a week.  . tiotropium (SPIRIVA HANDIHALER) 18 MCG inhalation capsule Place 1 capsule (18 mcg total) into inhaler and inhale daily.  [name] Kitchen tiZANidine (ZANAFLEX) 4 MG tablet Take 0.5-1 tablets (2-4 mg total) by mouth 3  (three) times daily as needed for muscle spasms (R jaw pain - sedation precautions).  . traMADol (ULTRAM) 50 MG tablet Take 1 tablet (50 mg total) by mouth every 12 (twelve) hours as needed for moderate pain.  [name] Kitchen triamterene-hydrochlorothiazide (MAXZIDE-25) 37.5-25 MG tablet Take 1 tablet by mouth daily.  [name] Kitchen zolpidem (AMBIEN) 10 MG tablet TAKE 1 TABLET(10 MG) BY MOUTH AT BEDTIME AS NEEDED FOR SLEEP   Facility-Administered Encounter Medications as of 12/30/2020  Medication  . methylPREDNISolone acetate (DEPO-MEDROL) injection 80 mg    Patient requested refill of albuterol 0.083% nebulizer solution from [facility]. Patient is out of refills at pharmacy. Refill request sent to Dr. [name] clinic to send if appropriate.   Follow-Up:  Comptroller and Pharmacist Review  Debbora Dus, CPP notified  Margaretmary Dys, [place] Pharmacy Assistant [phone]

## 2020-12-30 NOTE — Telephone Encounter (Signed)
Albuterol neb soln Last rx:  01/13/18, #90 mL Last OV:  11/11/20, SOB & COPD Next OV:  none

## 2020-12-30 NOTE — Telephone Encounter (Signed)
-----   Message from [place] sent at 12/30/2020 10:28 AM EDT ----- Regarding: Refill Patient called into [facility] requesting refill on his Albuterol 2.5 MG/3ML 0.083% nebulizer solution. It does not look like it has been filled in a while. If appropriate will you send refill to [facility], if patient needs appointment I will be happy to let him know.  Thank you,  Margaretmary Dys, [place] Pharmacy Assistant [phone]

## 2020-12-31 ENCOUNTER — Encounter: Payer: Self-pay | Admitting: Family Medicine

## 2020-12-31 MED ORDER — ALBUTEROL SULFATE (2.5 MG/3ML) 0.083% IN NEBU: 2.5000 mg | INHALATION_SOLUTION | Freq: Four times a day (QID) | RESPIRATORY_TRACT | 3 refills | Status: DC | PRN

## 2021-01-01 NOTE — Chronic Care Management (AMB) (Signed)
Per [facility] Mr. Aaron Mitchell's albuterol nebulizer solution needs Prior Authorization. Request has been sent to Dr. Bosie Clos clinic.   Debbora Dus, CPP notified  Margaretmary Dys, [place] Pharmacy Assistant [phone]

## 2021-01-02 ENCOUNTER — Telehealth: Payer: Self-pay | Admitting: Family Medicine

## 2021-01-02 ENCOUNTER — Telehealth: Payer: Self-pay

## 2021-01-02 NOTE — Telephone Encounter (Signed)
Spoke with pt notifying him that the PA was submitted and we are just waiting for a decision from his ins co.  Pt verbalizes understanding and expresses his thanks.

## 2021-01-02 NOTE — Telephone Encounter (Signed)
Received faxed PA request from [facility] for albuterol sulfate (2.5 mg/97mL) 0.083% neb soln.  Submitted PA; key:  [id number].  Decision pending.

## 2021-01-02 NOTE — Telephone Encounter (Signed)
Mr. Simmer called in and wanted to speak with Lattie Haw about a medication he is having trouble with getting.

## 2021-01-06 NOTE — Telephone Encounter (Signed)
Received PA denial.  Reason:  Request denied under Medicare Part D benefit; however, cover/payment for the requested drug has been approved under Medicare Part A/B as long as your doctor continues to prescribe the drug for you, and it continues to be safe fand effective for treating you condition.    Spoke with Misty of [facility] asking if they could submit request under Medicare Part A/B.  I was told they are not able to just yet.  Spoke with pt to relay above info, and he says med was already delivered and it only cost him a little more than $1.00.  Pt is satisfied.

## 2021-01-07 ENCOUNTER — Telehealth: Payer: Self-pay

## 2021-01-07 NOTE — Telephone Encounter (Signed)
Filled and in Lisa's box 

## 2021-01-07 NOTE — Telephone Encounter (Addendum)
Received faxed PA form from [employer] for Spiriva Handihaler 18 mcg.  Placed form in Dr. Synthia Innocent box.

## 2021-01-08 DIAGNOSIS — R9082 White matter disease, unspecified: Secondary | ICD-10-CM | POA: Diagnosis not present

## 2021-01-08 DIAGNOSIS — G500 Trigeminal neuralgia: Secondary | ICD-10-CM | POA: Diagnosis not present

## 2021-01-08 NOTE — Telephone Encounter (Addendum)
Faxed form.  Decision pending.  

## 2021-01-09 ENCOUNTER — Telehealth: Payer: Self-pay

## 2021-01-09 NOTE — Telephone Encounter (Signed)
[facility] received new prescription for Eliquis 5 mg BID on 01/08/21. Contacted patient to discuss. He states he was unaware of new medication. We discussed it was started by Dr. Nehemiah Massed for new diagnosis AFIB documented 12/12/20. Reviewed cost - $90 for 90 DS. Patient states he is okay with cost. Will deliver today. Discussed importance of daily adherence. Reminded to limit NSAID use (meloxicam, diclofenac, naproxen).  Discussed current use of NSAIDs - Voltaren Gel - Applies about 3 days/week for arthritis in hands - 3 times a week.   Diclofenac tablets - he takes if pain unresolved with Voltaren gel, reports no more than 6 tablets/month.  He no longer uses meloxicam (states completed course) and naproxen (He has replaced with Tylenol Arthritis strength)  CCM appt next Wednesday - 01/15/21, patient confirms.  Lab Results  Component Value Date   CREATININE 1.45 11/11/2020   BUN 20 11/11/2020   GFR 48.10 (L) 11/11/2020   GFRNONAA 51 (L) 12/30/2015   GFRAA 60 (L) 12/30/2015   NA 137 11/11/2020   K 3.6 11/11/2020   CALCIUM 8.8 11/11/2020   CO2 29 11/11/2020   CBC Latest Ref Rng & Units 11/11/2020 09/30/2020 09/20/2019  WBC 4.0 - 10.5 K/uL 10.1 - -  Hemoglobin 13.0 - 17.0 g/dL 15.7 18.1(H) 17.9(H)  Hematocrit 39.0 - 52.0 % 46.8 51.6(H) 51.6(H)  Platelets 150.0 - 400.0 K/uL 166.0 - -   Current Outpatient Medications on File Prior to Visit  Medication Sig Dispense Refill   albuterol (PROVENTIL) (2.5 MG/3ML) 0.083% nebulizer solution Take 3 mLs (2.5 mg total) by nebulization every 6 (six) hours as needed for wheezing or shortness of breath. 90 mL 3   albuterol (VENTOLIN HFA) 108 (90 Base) MCG/ACT inhaler Inhale 2 puffs into the lungs every 6 (six) hours as needed for wheezing or shortness of breath. 3 Inhaler 3   allopurinol (ZYLOPRIM) 300 MG tablet Take 1 tablet (300 mg total) by mouth daily. 90 tablet 3   amLODipine (NORVASC) 5 MG tablet Take 1 tablet (5 mg total) by mouth daily. 90 tablet 3    atorvastatin (LIPITOR) 40 MG tablet Take 1 tablet (40 mg total) by mouth daily. 90 tablet 3   diclofenac (VOLTAREN) 75 MG EC tablet Take 1 tablet (75 mg total) by mouth 2 (two) times daily. 180 tablet 0   diclofenac sodium (VOLTAREN) 1 % GEL Apply 2 g topically daily as needed. Apply to both hands.     fluticasone (FLONASE) 50 MCG/ACT nasal spray SHAKE LIQUID AND USE 2 SPRAYS IN EACH NOSTRIL DAILY 48 g 1   meloxicam (MOBIC) 15 MG tablet Take 1 tablet by mouth daily.     metaxalone (SKELAXIN) 800 MG tablet Take 1 tablet (800 mg total) by mouth 3 (three) times daily as needed for muscle spasms. 90 tablet 0   Multiple Vitamins tablet Take 1 tablet by mouth daily.     naproxen sodium (ANAPROX) 220 MG tablet Take 220 mg by mouth daily.      sildenafil (VIAGRA) 100 MG tablet Take by mouth.     tadalafil (CIALIS) 20 MG tablet Take 20 mg by mouth daily as needed for erectile dysfunction.     testosterone cypionate (DEPOTESTOSTERONE CYPIONATE) 200 MG/ML injection Inject 0.5 mLs (100 mg total) into the muscle once a week. 10 mL 2   tiotropium (SPIRIVA HANDIHALER) 18 MCG inhalation capsule Place 1 capsule (18 mcg total) into inhaler and inhale daily. 90 capsule 3   tiZANidine (ZANAFLEX) 4 MG tablet Take 0.5-1 tablets (  2-4 mg total) by mouth 3 (three) times daily as needed for muscle spasms (R jaw pain - sedation precautions). 40 tablet 0   traMADol (ULTRAM) 50 MG tablet Take 1 tablet (50 mg total) by mouth every 12 (twelve) hours as needed for moderate pain. 30 tablet 0   triamterene-hydrochlorothiazide (MAXZIDE-25) 37.5-25 MG tablet Take 1 tablet by mouth daily. 90 tablet 3   zolpidem (AMBIEN) 10 MG tablet TAKE 1 TABLET(10 MG) BY MOUTH AT BEDTIME AS NEEDED FOR SLEEP 30 tablet 0   Current Facility-Administered Medications on File Prior to Visit  Medication Dose Route Frequency Provider Last Rate Last Admin   methylPREDNISolone acetate (DEPO-MEDROL) injection 80 mg  80 mg Intra-articular Once [name],  Frederico Hamman, MD       Debbora Dus, PharmD Clinical Pharmacist [place] [facility] at [facility] [phone]

## 2021-01-09 NOTE — Telephone Encounter (Signed)
Patient wanted to update PCP -  Reports he still has edema. Reports he saw neurology yesterday who noticed the swelling. The 5 day course of Lasix helped some, but still some residual swelling. Denies worsening. Notes swelling is from knee down on both legs. No discomfort. SOB remains at baseline. No worsening. Patient just wanted to update PCP in case follow up is needed.  Debbora Dus, PharmD Clinical Pharmacist [place] [facility] at [facility] [phone]

## 2021-01-09 NOTE — Telephone Encounter (Signed)
Contacted patient - he says the Lasix was not really any more effective than Maxzide. He is okay with re-eval after echo, stress test.  Debbora Dus, PharmD Clinical Pharmacist [place] [facility] at [facility] [phone]

## 2021-01-09 NOTE — Telephone Encounter (Signed)
Was lasix more effective than maxzide for diuresis? If so would change from maxzide to lasix 20mg  daily - but would want him to come in 5 days after starting lasix 20mg  regularly for labs only (BMP check).  He also has echo and stress test pending as well as cards f/u which will be informative on best management options.

## 2021-01-13 ENCOUNTER — Telehealth: Payer: Self-pay

## 2021-01-13 NOTE — Telephone Encounter (Signed)
Received Tier Exception denial.  Reason:  The requested medication is already at the lowest possible tier for the brand/generic status of the drug, therefore a tier exception is not allowed.

## 2021-01-13 NOTE — Chronic Care Management (AMB) (Addendum)
    Chronic Care Management Pharmacy Assistant   Name: Aaron Mitchell  MRN: [id number] DOB: 1945/12/17  Reason for Encounter: CCM Reminder Call   Medications: Outpatient Encounter Medications as of 01/13/2021  Medication Sig   albuterol (PROVENTIL) (2.5 MG/3ML) 0.083% nebulizer solution Take 3 mLs (2.5 mg total) by nebulization every 6 (six) hours as needed for wheezing or shortness of breath.   albuterol (VENTOLIN HFA) 108 (90 Base) MCG/ACT inhaler Inhale 2 puffs into the lungs every 6 (six) hours as needed for wheezing or shortness of breath.   allopurinol (ZYLOPRIM) 300 MG tablet Take 1 tablet (300 mg total) by mouth daily.   amLODipine (NORVASC) 5 MG tablet Take 1 tablet (5 mg total) by mouth daily.   apixaban (ELIQUIS) 5 MG TABS tablet Take 5 mg by mouth 2 (two) times daily.   atorvastatin (LIPITOR) 40 MG tablet Take 1 tablet (40 mg total) by mouth daily.   diclofenac (VOLTAREN) 75 MG EC tablet Take 1 tablet (75 mg total) by mouth 2 (two) times daily. (Patient taking differently: Take 75 mg by mouth 2 (two) times daily. PRN)   diclofenac sodium (VOLTAREN) 1 % GEL Apply 2 g topically daily as needed. Apply to both hands.   fluticasone (FLONASE) 50 MCG/ACT nasal spray SHAKE LIQUID AND USE 2 SPRAYS IN EACH NOSTRIL DAILY   metaxalone (SKELAXIN) 800 MG tablet Take 1 tablet (800 mg total) by mouth 3 (three) times daily as needed for muscle spasms.   Multiple Vitamins tablet Take 1 tablet by mouth daily.   sildenafil (VIAGRA) 100 MG tablet Take by mouth.   tadalafil (CIALIS) 20 MG tablet Take 20 mg by mouth daily as needed for erectile dysfunction.   testosterone cypionate (DEPOTESTOSTERONE CYPIONATE) 200 MG/ML injection Inject 0.5 mLs (100 mg total) into the muscle once a week.   tiotropium (SPIRIVA HANDIHALER) 18 MCG inhalation capsule Place 1 capsule (18 mcg total) into inhaler and inhale daily.   tiZANidine (ZANAFLEX) 4 MG tablet Take 0.5-1 tablets (2-4 mg total) by mouth 3 (three) times  daily as needed for muscle spasms (R jaw pain - sedation precautions).   traMADol (ULTRAM) 50 MG tablet Take 1 tablet (50 mg total) by mouth every 12 (twelve) hours as needed for moderate pain.   triamterene-hydrochlorothiazide (MAXZIDE-25) 37.5-25 MG tablet Take 1 tablet by mouth daily.   zolpidem (AMBIEN) 10 MG tablet TAKE 1 TABLET(10 MG) BY MOUTH AT BEDTIME AS NEEDED FOR SLEEP   Facility-Administered Encounter Medications as of 01/13/2021  Medication   methylPREDNISolone acetate (DEPO-MEDROL) injection 80 mg    Aaron Mitchell was contacted to remind him of his upcoming telephone visit with Debbora Dus on 01/15/2021 at 11:00 AM.  He was reminded to have all medications, supplements and any blood glucose and blood pressure readings available for review at appointment.  Are you having any problems with your medications? No   What concerns would you like to discuss with the pharmacist? none   Star Rating Drugs: Medication:  Last Fill: Day Supply Atorvastatin 40mg  11/14/20 90   The patient has cardiology appointment this same day. He may not be available until 11:30 AM.   Debbora Dus, CPP notified  Avel Sensor, [place] Assistant [phone]  I have reviewed the care management and care coordination activities outlined in this encounter and I am certifying that I agree with the content of this note. No further action required.  Debbora Dus, PharmD Clinical Pharmacist [place] [facility] at [facility] [phone]

## 2021-01-15 ENCOUNTER — Other Ambulatory Visit: Payer: Self-pay

## 2021-01-15 ENCOUNTER — Telehealth: Payer: PPO

## 2021-01-15 DIAGNOSIS — R0602 Shortness of breath: Secondary | ICD-10-CM | POA: Diagnosis not present

## 2021-01-15 DIAGNOSIS — I480 Paroxysmal atrial fibrillation: Secondary | ICD-10-CM | POA: Diagnosis not present

## 2021-01-15 DIAGNOSIS — I447 Left bundle-branch block, unspecified: Secondary | ICD-10-CM | POA: Diagnosis not present

## 2021-01-15 NOTE — Progress Notes (Incomplete)
Chronic Care Management Pharmacy Note  01/15/2021 Name:  Aaron Mitchell MRN:  [id number] DOB:  [birth year]  Subjective: Aaron Mitchell is an 75 y.o. year old male who is a primary patient of [name], MD.  The CCM team was consulted for assistance with disease management and care coordination needs.    Engaged with patient by telephone for follow up visit in response to provider referral for pharmacy case management and/or care coordination services.   Consent to Services:  The patient was given information about Chronic Care Management services, agreed to services, and gave verbal consent prior to initiation of services.  Please see initial visit note for detailed documentation.   Patient Care Team: [name], MD as PCP - General (Family Medicine) Emmaline Kluver., MD (Rheumatology) Leandrew Koyanagi, MD as Referring Physician (Ophthalmology) Beverly Gust, MD (Otolaryngology) Debbora Dus, [facility] as Pharmacist (Pharmacist)  Recent office visits: 07/10/20 - [name] - Internal derangement of right knee, For now I would ice his knee multiple times throughout his day, as well as work on his range of motion mobilize the effusion.  Do some Tylenol and NSAIDs. 06/03/20 - [name] - Kenalog injection to left hip  Recent consult visits:  04/15/20 - Diabetic eye exam  Hospital visits: None in previous 6 months  Objective:  Lab Results  Component Value Date   CREATININE 1.45 11/11/2020   BUN 20 11/11/2020   GFR 48.10 (L) 11/11/2020   GFRNONAA 51 (L) 12/30/2015   GFRAA 60 (L) 12/30/2015   NA 137 11/11/2020   K 3.6 11/11/2020   CALCIUM 8.8 11/11/2020   CO2 29 11/11/2020    Lab Results  Component Value Date/Time   HGBA1C 6.5 10/10/2020 10:15 AM   HGBA1C 6.4 06/01/2019 09:47 AM   GFR 48.10 (L) 11/11/2020 03:09 PM   GFR 59.80 (L) 10/10/2020 10:15 AM   MICROALBUR 6.7 (H) 10/16/2020 02:08 PM   MICROALBUR 12.0 (H) 07/27/2019 08:46 AM    Last diabetic  Eye exam:  Lab Results  Component Value Date/Time   HMDIABEYEEXA Retinopathy (A) 04/15/2020 12:00 AM    Last diabetic Foot exam: Unknown  Lab Results  Component Value Date   CHOL 101 10/10/2020   HDL 45.00 10/10/2020   LDLCALC 39 10/10/2020   LDLDIRECT 108.0 09/14/2016   TRIG 86.0 10/10/2020   CHOLHDL 2 10/10/2020    Hepatic Function Latest Ref Rng & Units 10/10/2020 07/27/2019 07/05/2018  Total Protein 6.0 - 8.3 g/dL 6.4 6.6 6.6  Albumin 3.5 - 5.2 g/dL 4.1 4.2 4.2  AST 0 - 37 U/L 18 26 33  ALT 0 - 53 U/L 24 32 41  Alk Phosphatase 39 - 117 U/L 50 52 50  Total Bilirubin 0.2 - 1.2 mg/dL 0.8 1.1 0.8    Lab Results  Component Value Date/Time   TSH 2.19 11/11/2020 03:09 PM   TSH 1.29 12/08/2016 12:18 PM    CBC Latest Ref Rng & Units 11/11/2020 09/30/2020 09/20/2019  WBC 4.0 - 10.5 K/uL 10.1 - -  Hemoglobin 13.0 - 17.0 g/dL 15.7 18.1(H) 17.9(H)  Hematocrit 39.0 - 52.0 % 46.8 51.6(H) 51.6(H)  Platelets 150.0 - 400.0 K/uL 166.0 - -   No results found for: VD25OH  Clinical ASCVD: No  The ASCVD Risk score Mikey Bussing DC Jr., et al., 2013) failed to calculate for the following reasons:   The valid total cholesterol range is 130 to 320 mg/dL    Depression screen [name] 2/9 10/09/2020 07/25/2019 07/05/2018  Decreased Interest 0 0 0  Down, Depressed, Hopeless 0 0 0  PHQ - 2 Score 0 0 0  Altered sleeping 0 0 0  Tired, decreased energy 0 0 0  Change in appetite 0 0 0  Feeling bad or failure about yourself  0 0 0  Trouble concentrating 0 0 0  Moving slowly or fidgety/restless 0 0 0  Suicidal thoughts 0 0 0  PHQ-9 Score 0 0 0  Difficult doing work/chores Not difficult at all Not difficult at all Not difficult at all    Social History   Tobacco Use  Smoking Status Former   Pack years: 0.00   Types: Cigarettes   Quit date: 07/27/1978   Years since quitting: 42.5  Smokeless Tobacco Never  Tobacco Comments   Quit in 1980   BP Readings from Last 3 Encounters:  12/09/20 (!) 150/60   11/11/20 128/68  10/31/20 100/60   Pulse Readings from Last 3 Encounters:  12/09/20 64  11/11/20 63  10/31/20 (!) 44   Wt Readings from Last 3 Encounters:  12/09/20 260 lb (117.9 kg)  11/11/20 282 lb 1 oz (127.9 kg)  10/31/20 274 lb 12 oz (124.6 kg)    Assessment/Interventions: Review of patient past medical history, allergies, medications, health status, including review of consultants reports, laboratory and other test data, was performed as part of comprehensive evaluation and provision of chronic care management services.   SDOH:  (Social Determinants of Health) assessments and interventions performed: Yes    CCM Care Plan  Allergies  Allergen Reactions   Codeine Other (See Comments)    Other reaction(s): Other (See Comments) HEADACHES Causes headaches when he stops liquid Codeine-based products   Ace Inhibitors Cough    Cough also present with ARB (losartan)    Medications Reviewed Today     Reviewed by Debbora Dus, [facility] (Pharmacist) on 01/09/21 at [phone]  Med List Status: <None>   Medication Order Taking? Sig Documenting Provider Last Dose Status Informant  albuterol (PROVENTIL) (2.5 MG/3ML) 0.083% nebulizer solution [id number]  Take 3 mLs (2.5 mg total) by nebulization every 6 (six) hours as needed for wheezing or shortness of breath. [name], MD  Active   albuterol (VENTOLIN HFA) 108 (90 Base) MCG/ACT inhaler [id number]  Inhale 2 puffs into the lungs every 6 (six) hours as needed for wheezing or shortness of breath. [name], MD  Active   allopurinol (ZYLOPRIM) 300 MG tablet [id number]  Take 1 tablet (300 mg total) by mouth daily. [name], MD  Active   amLODipine (NORVASC) 5 MG tablet [id number]  Take 1 tablet (5 mg total) by mouth daily. [name], MD  Active   apixaban (ELIQUIS) 5 MG TABS tablet [id number] Yes Take 5 mg by mouth 2 (two) times daily. [provider] Taking Active   atorvastatin (LIPITOR) 40 MG tablet  [id number]  Take 1 tablet (40 mg total) by mouth daily. [name], MD  Active   diclofenac (VOLTAREN) 75 MG EC tablet [id number]  Take 1 tablet (75 mg total) by mouth 2 (two) times daily.  Patient taking differently: Take 75 mg by mouth 2 (two) times daily. PRN   [name], MD  Active   diclofenac sodium (VOLTAREN) 1 % GEL [id number] Yes Apply 2 g topically daily as needed. Apply to both hands. [provider] Taking Active Multiple Informants           Med Note Nicholes Mango   Sat Dec 28, 2015  3:46 PM)    fluticasone (FLONASE) 50  MCG/ACT nasal spray [id number]  SHAKE LIQUID AND USE 2 SPRAYS IN EACH NOSTRIL DAILY [name], MD  Active   metaxalone [facility]) 800 MG tablet [id number]  Take 1 tablet (800 mg total) by mouth 3 (three) times daily as needed for muscle spasms. [name], MD  Active   methylPREDNISolone acetate (DEPO-MEDROL) injection 80 mg [id number]   Owens Loffler, MD  Active   Multiple Vitamins tablet [id number]  Take 1 tablet by mouth daily. [provider]  Active Multiple Informants           Med Note Nicholes Mango   Sat Dec 28, 2015  3:46 PM)    sildenafil (VIAGRA) 100 MG tablet [id number]  Take by mouth. [provider]  Active   tadalafil (CIALIS) 20 MG tablet [id number]  Take 20 mg by mouth daily as needed for erectile dysfunction. [provider]  Active   testosterone cypionate (DEPOTESTOSTERONE CYPIONATE) 200 MG/ML injection [id number]  Inject 0.5 mLs (100 mg total) into the muscle once a week. Festus Aloe, MD  Active   tiotropium [facility] HANDIHALER) 18 MCG inhalation capsule [id number]  Place 1 capsule (18 mcg total) into inhaler and inhale daily. [name], MD  Active   tiZANidine (ZANAFLEX) 4 MG tablet [id number]  Take 0.5-1 tablets (2-4 mg total) by mouth 3 (three) times daily as needed for muscle spasms (R jaw pain - sedation precautions). [name], MD  Active   traMADol [facility])  50 MG tablet [id number]  Take 1 tablet (50 mg total) by mouth every 12 (twelve) hours as needed for moderate pain. [name], MD  Active   triamterene-hydrochlorothiazide [facility]) 37.5-25 MG tablet [id number]  Take 1 tablet by mouth daily. [name], MD  Active   zolpidem (AMBIEN) 10 MG tablet [id number]  TAKE 1 TABLET(10 MG) BY MOUTH AT BEDTIME AS NEEDED FOR SLEEP [name], MD  Active             Patient Active Problem List   Diagnosis Date Noted   Abnormal EKG 11/12/2020   Shortness of breath 11/11/2020   Pedal edema 11/11/2020   Right facial pain 06/01/2019   Sore throat 03/07/2019   Cough due to ACE inhibitor 06/15/2018   Background diabetic retinopathy ([place]) 08/17/2017   Greater trochanteric bursitis of right hip 07/30/2017   Health maintenance examination 07/14/2017   Advanced care planning/counseling discussion 07/14/2017   Stage 3 severe COPD by GOLD classification ([place]) 03/31/2017   Chest congestion 12/08/2016   Moderate mitral insufficiency 09/14/2016   Male hypogonadism    Insomnia 05/12/2016   OSA (obstructive sleep apnea) 05/12/2016   Chronic back pain    Degenerative disc disease, lumbar    Hyperlipidemia associated with type 2 diabetes mellitus ([place]) 09/17/2015   ED (erectile dysfunction) of organic origin 03/25/2015   CMC arthritis, thumb, degenerative 01/19/2013   Rotator cuff impingement syndrome 10/20/2011   Chronic gouty arthropathy 04/02/2011   Severe obesity (BMI 35.0-39.9) with comorbidity ([place]) 04/02/2011   Osteoarthritis 04/02/2011   Allergic rhinitis, seasonal 04/02/2011   Chronic shoulder pain 04/02/2011   Prediabetes 03/10/2011   Essential (primary) hypertension 03/10/2011    Immunization History  Administered Date(s) Administered   Influenza, High Dose Seasonal PF 05/14/2017, 04/07/2018, 04/18/2020   Influenza, Quadrivalent, Recombinant, Inj, Pf 03/20/2019, 06/12/2019   Influenza-Unspecified 08/27/2016   Moderna  Sars-Covid-2 Vaccination 09/08/2019, 10/06/2019, 05/20/2020   Pneumococcal Conjugate-13 08/23/2014, 03/20/2019   Pneumococcal Polysaccharide-23 03/26/2016   Td 08/23/2014   Tdap 08/23/2014   Zoster Recombinat (  Shingrix) 03/14/2018, 05/16/2018   Zoster, Live 08/08/2013    Conditions to be addressed/monitored:  Hypertension and COPD  There are no care plans that you recently modified to display for this patient.   Reports echo completed this morning. Stress test will be rescheduled 1-2nd week of July. Appt with podiatry scheduled due to heel pain in left. Echo still showing AFIB. Started the Eliquis BID. Stopped aspirin. No issues so far.  No concerns.   Denies SOB lately, at baseline with COPD. As long as not overdoing it he is okay.  Uses Spiriva daily and albuterol PRN 01/08/21 - Neurology - Transgeminal nerualhgia, no medical treament   BP - not checked in last couple weeks  12/09/20 - Dr. Lorelei Pont, chest pain - this has resolved.   Nerve pain has improved,  Medication Assistance: None required.  Patient affirms current coverage meets needs.  Patient's preferred pharmacy is:  [facility] - [place], [place] - [address] [place] [address] Phone: [phone] Fax: [place], [place] - [address] [facility] [place] [address] Phone: [phone] Fax: [phone]  Care Plan and Follow Up Patient Decision:  Patient agrees to Care Plan and Follow-up.  Debbora Dus, PharmD Clinical Pharmacist [facility] at [facility] [phone]

## 2021-01-20 ENCOUNTER — Encounter: Payer: Self-pay | Admitting: Family Medicine

## 2021-01-20 NOTE — Telephone Encounter (Signed)
Updated pt's chart.  

## 2021-01-22 ENCOUNTER — Encounter: Payer: Self-pay | Admitting: Family Medicine

## 2021-01-22 DIAGNOSIS — I480 Paroxysmal atrial fibrillation: Secondary | ICD-10-CM | POA: Insufficient documentation

## 2021-01-22 DIAGNOSIS — I998 Other disorder of circulatory system: Secondary | ICD-10-CM | POA: Insufficient documentation

## 2021-01-22 DIAGNOSIS — I482 Chronic atrial fibrillation, unspecified: Secondary | ICD-10-CM | POA: Insufficient documentation

## 2021-01-23 ENCOUNTER — Encounter: Payer: Self-pay | Admitting: Family Medicine

## 2021-01-28 ENCOUNTER — Encounter: Payer: Self-pay | Admitting: Family Medicine

## 2021-02-03 DIAGNOSIS — G8929 Other chronic pain: Secondary | ICD-10-CM | POA: Diagnosis not present

## 2021-02-03 DIAGNOSIS — E1140 Type 2 diabetes mellitus with diabetic neuropathy, unspecified: Secondary | ICD-10-CM | POA: Diagnosis not present

## 2021-02-03 DIAGNOSIS — M722 Plantar fascial fibromatosis: Secondary | ICD-10-CM | POA: Diagnosis not present

## 2021-02-03 DIAGNOSIS — M7732 Calcaneal spur, left foot: Secondary | ICD-10-CM | POA: Diagnosis not present

## 2021-02-03 DIAGNOSIS — B351 Tinea unguium: Secondary | ICD-10-CM | POA: Diagnosis not present

## 2021-02-03 DIAGNOSIS — L600 Ingrowing nail: Secondary | ICD-10-CM | POA: Diagnosis not present

## 2021-02-03 DIAGNOSIS — M79672 Pain in left foot: Secondary | ICD-10-CM | POA: Diagnosis not present

## 2021-02-04 ENCOUNTER — Telehealth: Payer: Self-pay

## 2021-02-04 NOTE — Chronic Care Management (AMB) (Addendum)
Chronic Care Management Pharmacy Assistant   Name: Aaron Mitchell  MRN: [id number] DOB: [birth year]  Reason for Encounter: Medication Adherence and Delivery Coordination   Recent office visits:  None since last CCM contact  Recent consult visits:  None since last CCM contact  Hospital visits:  None in previous 6 months  Medications: Outpatient Encounter Medications as of 02/04/2021  Medication Sig   albuterol (PROVENTIL) (2.5 MG/3ML) 0.083% nebulizer solution Take 3 mLs (2.5 mg total) by nebulization every 6 (six) hours as needed for wheezing or shortness of breath.   albuterol (VENTOLIN HFA) 108 (90 Base) MCG/ACT inhaler Inhale 2 puffs into the lungs every 6 (six) hours as needed for wheezing or shortness of breath.   allopurinol (ZYLOPRIM) 300 MG tablet Take 1 tablet (300 mg total) by mouth daily.   amLODipine (NORVASC) 5 MG tablet Take 1 tablet (5 mg total) by mouth daily.   apixaban (ELIQUIS) 5 MG TABS tablet Take 5 mg by mouth 2 (two) times daily.   atorvastatin (LIPITOR) 40 MG tablet Take 1 tablet (40 mg total) by mouth daily.   diclofenac (VOLTAREN) 75 MG EC tablet Take 1 tablet (75 mg total) by mouth 2 (two) times daily. (Patient taking differently: Take 75 mg by mouth 2 (two) times daily. PRN)   diclofenac sodium (VOLTAREN) 1 % GEL Apply 2 g topically daily as needed. Apply to both hands.   fluticasone (FLONASE) 50 MCG/ACT nasal spray SHAKE LIQUID AND USE 2 SPRAYS IN EACH NOSTRIL DAILY   metaxalone (SKELAXIN) 800 MG tablet Take 1 tablet (800 mg total) by mouth 3 (three) times daily as needed for muscle spasms.   Multiple Vitamins tablet Take 1 tablet by mouth daily.   sildenafil (VIAGRA) 100 MG tablet Take by mouth.   tadalafil (CIALIS) 20 MG tablet Take 20 mg by mouth daily as needed for erectile dysfunction.   testosterone cypionate (DEPOTESTOSTERONE CYPIONATE) 200 MG/ML injection Inject 0.5 mLs (100 mg total) into the muscle once a week.   tiotropium (SPIRIVA  HANDIHALER) 18 MCG inhalation capsule Place 1 capsule (18 mcg total) into inhaler and inhale daily.   tiZANidine (ZANAFLEX) 4 MG tablet Take 0.5-1 tablets (2-4 mg total) by mouth 3 (three) times daily as needed for muscle spasms (R jaw pain - sedation precautions).   traMADol (ULTRAM) 50 MG tablet Take 1 tablet (50 mg total) by mouth every 12 (twelve) hours as needed for moderate pain.   triamterene-hydrochlorothiazide (MAXZIDE-25) 37.5-25 MG tablet Take 1 tablet by mouth daily.   zolpidem (AMBIEN) 10 MG tablet TAKE 1 TABLET(10 MG) BY MOUTH AT BEDTIME AS NEEDED FOR SLEEP   Facility-Administered Encounter Medications as of 02/04/2021  Medication   methylPREDNISolone acetate (DEPO-MEDROL) injection 80 mg   BP Readings from Last 3 Encounters:  12/09/20 (!) 150/60  11/11/20 128/68  10/31/20 100/60    Lab Results  Component Value Date   HGBA1C 6.5 10/10/2020      No OVs, Consults, or hospital visits since last care coordination call / Pharmacist visit.  Patient obtains medications through Adherence Packaging  90 Days   Last adherence delivery date:11/15/2020      Patient is due for next adherence delivery on: 02/13/2021  Spoke with patient on 02/04/2021 reviewed medications and coordinated delivery.  This delivery to include: Adherence Packaging  90 Days  Packs: Allopurinol 300 mg 1 tablet before breakfast Amlodipine 5 mg 1 tablet before breakfast Atorvastatin 40 mg 1 tablet before breakfast Triamterene 37.5 mg-HCTZ 25 mg 1 tablet  before breakfast PRN/VIAL medications:  Fluticasone (FLONASE) 50 MCG/ACT nasal spray use 2 sprays daily  Diclofenac 75mg  take 1 tablet 2 times daily   Patient declined the following medications this month:  Eliquis 5mg  take 1 tablet 2 times daily (90 ds filled 01/09/21)  Refills requested from PCP include: Diclofenac 75mg  take 1 tablet 2 times daily   Confirmed delivery date of 02/13/2021, advised patient that pharmacy will contact him the morning of  delivery.  Patient reports he did receive the albuterol inhaler refill  Follow-Up:  Coordination of Enhanced Pharmacy Services and Pharmacist Review  Debbora Dus, CPP notified  Avel Sensor, [place] Pharmacy Assistant [phone]  I have reviewed the care management and care coordination activities outlined in this encounter and I am certifying that I agree with the content of this note. No further action required.  Debbora Dus, PharmD Clinical Pharmacist [facility] at [facility] [phone]

## 2021-02-04 NOTE — Progress Notes (Signed)
Refill request to to Dr. Bosie Clos clinic for diclofenac 75 mg to be sent to [facility] if appropriate.    Debbora Dus, CPP notified  Margaretmary Dys, [place] Pharmacy Assistant [phone]

## 2021-02-09 ENCOUNTER — Encounter: Payer: Self-pay | Admitting: Family Medicine

## 2021-02-10 ENCOUNTER — Encounter: Payer: Self-pay | Admitting: Family Medicine

## 2021-02-11 ENCOUNTER — Other Ambulatory Visit: Payer: Self-pay | Admitting: *Deleted

## 2021-02-11 DIAGNOSIS — E291 Testicular hypofunction: Secondary | ICD-10-CM

## 2021-02-12 ENCOUNTER — Encounter: Payer: Self-pay | Admitting: Family Medicine

## 2021-02-12 ENCOUNTER — Telehealth: Payer: Self-pay

## 2021-02-12 ENCOUNTER — Other Ambulatory Visit: Payer: Self-pay | Admitting: Family Medicine

## 2021-02-12 MED ORDER — DICLOFENAC SODIUM 75 MG PO TBEC: 75.0000 mg | DELAYED_RELEASE_TABLET | Freq: Two times a day (BID) | ORAL | 0 refills | Status: DC | PRN

## 2021-02-12 MED ORDER — DICLOFENAC SODIUM 75 MG PO TBEC: 75.0000 mg | DELAYED_RELEASE_TABLET | Freq: Two times a day (BID) | ORAL | 0 refills | Status: DC

## 2021-02-12 MED ORDER — FLUTICASONE PROPIONATE 50 MCG/ACT NA SUSP: NASAL | 2 refills | Status: DC

## 2021-02-12 NOTE — Telephone Encounter (Signed)
Received refill request for diclofenac however he is now on eliquis so don't recommend continued diclofenac use.  I have sent in #60 for 1 month supply , recommend use sparingly and we will need to discuss alternative treatment option.

## 2021-02-12 NOTE — Telephone Encounter (Signed)
Received faxed refill request from [facility].  Voltaren tab Last rx:  05/19/20, #108 Last OV:  11/11/20, SOB Next OV:  none

## 2021-02-12 NOTE — Telephone Encounter (Signed)
I've refilled voltaren #60 to use BID PRN - need to minimize use as now on eliquis. Will need to discuss alternative to voltaren tablets. Ok to continue voltaren gel. Please schedule OV.

## 2021-02-12 NOTE — Telephone Encounter (Signed)
Pt called to follow up on mychart message

## 2021-02-13 ENCOUNTER — Other Ambulatory Visit: Payer: Self-pay | Admitting: Family Medicine

## 2021-02-13 ENCOUNTER — Other Ambulatory Visit: Payer: Self-pay | Admitting: Urology

## 2021-02-13 ENCOUNTER — Encounter: Payer: Self-pay | Admitting: Family Medicine

## 2021-02-13 DIAGNOSIS — R0602 Shortness of breath: Secondary | ICD-10-CM | POA: Diagnosis not present

## 2021-02-13 DIAGNOSIS — I447 Left bundle-branch block, unspecified: Secondary | ICD-10-CM | POA: Diagnosis not present

## 2021-02-13 DIAGNOSIS — I480 Paroxysmal atrial fibrillation: Secondary | ICD-10-CM | POA: Diagnosis not present

## 2021-02-13 MED ORDER — TESTOSTERONE CYPIONATE 200 MG/ML IM SOLN: 100.0000 mg | INTRAMUSCULAR | 0 refills | Status: DC

## 2021-02-13 MED ORDER — FUROSEMIDE 40 MG PO TABS: 40.0000 mg | ORAL_TABLET | Freq: Every day | ORAL | 1 refills | Status: DC | PRN

## 2021-02-13 NOTE — Telephone Encounter (Signed)
Spoke with patient scheduled follow up appointment

## 2021-02-13 NOTE — Telephone Encounter (Signed)
Replied via other mychart message.  

## 2021-02-13 NOTE — Telephone Encounter (Signed)
We discussed this when he started Eliquis (see telephone note 01/09/21). Pt reports using Voltaren Gel about 3 days/week for arthritis in hands. He takes the diclofenac tablets if pain unresolved with Voltaren gel and Tylenol. Reports no more than 6 tablets/month. Encouraged him to limit use as much as possible.   Debbora Dus, PharmD Clinical Pharmacist [facility] at [facility] [phone]

## 2021-02-13 NOTE — Telephone Encounter (Signed)
See other mychart note.

## 2021-02-13 NOTE — Telephone Encounter (Signed)
Per Dr. Darnell Level, plz schedule OV for pain med f/u. (See message below)

## 2021-02-17 DIAGNOSIS — I447 Left bundle-branch block, unspecified: Secondary | ICD-10-CM | POA: Diagnosis not present

## 2021-02-17 DIAGNOSIS — I480 Paroxysmal atrial fibrillation: Secondary | ICD-10-CM | POA: Diagnosis not present

## 2021-02-17 DIAGNOSIS — E782 Mixed hyperlipidemia: Secondary | ICD-10-CM | POA: Diagnosis not present

## 2021-02-17 DIAGNOSIS — I10 Essential (primary) hypertension: Secondary | ICD-10-CM | POA: Diagnosis not present

## 2021-02-18 DIAGNOSIS — G4733 Obstructive sleep apnea (adult) (pediatric): Secondary | ICD-10-CM | POA: Diagnosis not present

## 2021-02-19 ENCOUNTER — Encounter: Payer: Self-pay | Admitting: Family Medicine

## 2021-02-19 ENCOUNTER — Ambulatory Visit (INDEPENDENT_AMBULATORY_CARE_PROVIDER_SITE_OTHER): Payer: PPO | Admitting: Family Medicine

## 2021-02-19 ENCOUNTER — Other Ambulatory Visit: Payer: Self-pay

## 2021-02-19 VITALS — BP 134/82 | HR 60 | Temp 97.6°F | Ht 69.25 in | Wt 265.4 lb

## 2021-02-19 DIAGNOSIS — Z1211 Encounter for screening for malignant neoplasm of colon: Secondary | ICD-10-CM | POA: Diagnosis not present

## 2021-02-19 DIAGNOSIS — I340 Nonrheumatic mitral (valve) insufficiency: Secondary | ICD-10-CM

## 2021-02-19 DIAGNOSIS — R600 Localized edema: Secondary | ICD-10-CM | POA: Diagnosis not present

## 2021-02-19 DIAGNOSIS — E291 Testicular hypofunction: Secondary | ICD-10-CM

## 2021-02-19 DIAGNOSIS — I482 Chronic atrial fibrillation, unspecified: Secondary | ICD-10-CM

## 2021-02-19 DIAGNOSIS — M159 Polyosteoarthritis, unspecified: Secondary | ICD-10-CM

## 2021-02-19 DIAGNOSIS — I495 Sick sinus syndrome: Secondary | ICD-10-CM

## 2021-02-19 DIAGNOSIS — M8949 Other hypertrophic osteoarthropathy, multiple sites: Secondary | ICD-10-CM | POA: Diagnosis not present

## 2021-02-19 MED ORDER — ACETAMINOPHEN ER 650 MG PO TBCR: 1300.0000 mg | EXTENDED_RELEASE_TABLET | Freq: Two times a day (BID) | ORAL | Status: AC | PRN

## 2021-02-19 NOTE — Patient Instructions (Addendum)
Stay off diclofenac. Continue tylenol '1300mg'$  in the morning with extra in evening if needed.  Continue lasix '40mg'$  daily for now, once leg swelling has resolved, change to as needed.  We have placed order for cologuard. Let us know if you don't receive  communication over next 1-2 weeks. Good to see you today Try knee high compression stockings  Testosterone injections should go into gluteus medius alternating site each time

## 2021-02-19 NOTE — Progress Notes (Signed)
Patient ID: Aaron Mitchell, male    DOB: [birth year], 75 y.o.   MRN: [id number]  This visit was conducted in person.  BP 134/82   Pulse 60   Temp 97.6 F (36.4 C) (Temporal)   Ht 5' 9.25" (1.759 m)   Wt 265 lb 7 oz (120.4 kg)   SpO2 97%   BMI 38.92 kg/m    CC: leg swelling Subjective:   HPI: Aaron Mitchell is a 75 y.o. male presenting on 02/19/2021 for Follow-up (Here for medication f/u for Voltaren tabs. ) and Leg Swelling (Here for f/u.  Increased Lasix dose is helping.  Pt accompanied by wife, [name]- temp 97.7.)   See prior note for details.  Seeing cardiology Nehemiah Massed) for sick sinus syndrome, chronic atrial fibrillation, eliquis started 12/2020. Discussing medtronic pacemaker.  Recent echocardiogram showing normal LV function EF >55%, mild MR, mild TR without pulmonary hypertension.  Recent reassuring stress test last week without signs of inducible ischemia, chronic LBBB.   Discussed interaction of eliquis with diclofenac '75mg'$  which he has been taking chronically for osteoarthritis pain. Off diclofenac for the past several weeks.   Pedal edema - acutely worsened over last several weeks associated with development of clear fluid blisters and weeping legs - we increased lasix to '40mg'$  daily with improvement. Previous weight of 282lbs dropped to 260lbs with initial lasix course. Lasix '20mg'$  dose was ineffective. Goal weight at this time seems ~260lbs.   Requests cologuard ordered.  Note sore area after Testosterone injection Saturday evening. No surrounding redness or warmth      Relevant past medical, surgical, family and social history reviewed and updated as indicated. Interim medical history since our last visit reviewed. Allergies and medications reviewed and updated. Outpatient Medications Prior to Visit  Medication Sig Dispense Refill   albuterol (PROVENTIL) (2.5 MG/3ML) 0.083% nebulizer solution Take 3 mLs (2.5 mg total) by nebulization every 6 (six) hours as needed  for wheezing or shortness of breath. 90 mL 3   albuterol (VENTOLIN HFA) 108 (90 Base) MCG/ACT inhaler Inhale 2 puffs into the lungs every 6 (six) hours as needed for wheezing or shortness of breath. 3 Inhaler 3   allopurinol (ZYLOPRIM) 300 MG tablet Take 1 tablet (300 mg total) by mouth daily. 90 tablet 3   amLODipine (NORVASC) 5 MG tablet Take 1 tablet (5 mg total) by mouth daily. 90 tablet 3   apixaban (ELIQUIS) 5 MG TABS tablet Take 5 mg by mouth 2 (two) times daily.     atorvastatin (LIPITOR) 40 MG tablet Take 1 tablet (40 mg total) by mouth daily. 90 tablet 3   diclofenac sodium (VOLTAREN) 1 % GEL Apply 2 g topically daily as needed. Apply to both hands.     fluticasone (FLONASE) 50 MCG/ACT nasal spray SHAKE LIQUID AND USE 2 SPRAYS IN EACH NOSTRIL DAILY 48 g 2   furosemide (LASIX) 40 MG tablet Take 1 tablet (40 mg total) by mouth daily as needed for edema. 30 tablet 1   metaxalone (SKELAXIN) 800 MG tablet Take 1 tablet (800 mg total) by mouth 3 (three) times daily as needed for muscle spasms. 90 tablet 0   Multiple Vitamins tablet Take 1 tablet by mouth daily.     sildenafil (VIAGRA) 100 MG tablet Take by mouth.     tadalafil (CIALIS) 20 MG tablet Take 20 mg by mouth daily as needed for erectile dysfunction.     testosterone cypionate (DEPOTESTOSTERONE CYPIONATE) 200 MG/ML injection Inject 0.5 mLs (100 mg total)  into the muscle once a week. 10 mL 0   tiotropium (SPIRIVA HANDIHALER) 18 MCG inhalation capsule Place 1 capsule (18 mcg total) into inhaler and inhale daily. 90 capsule 3   tiZANidine (ZANAFLEX) 4 MG tablet Take 0.5-1 tablets (2-4 mg total) by mouth 3 (three) times daily as needed for muscle spasms (R jaw pain - sedation precautions). 40 tablet 0   traMADol (ULTRAM) 50 MG tablet Take 1 tablet (50 mg total) by mouth every 12 (twelve) hours as needed for moderate pain. 30 tablet 0   triamterene-hydrochlorothiazide (MAXZIDE-25) 37.5-25 MG tablet Take 1 tablet by mouth daily. 90 tablet 3    zolpidem (AMBIEN) 10 MG tablet TAKE 1 TABLET(10 MG) BY MOUTH AT BEDTIME AS NEEDED FOR SLEEP 30 tablet 0   diclofenac (VOLTAREN) 75 MG EC tablet Take 1 tablet (75 mg total) by mouth 2 (two) times daily as needed for moderate pain (sparing use). PRN 60 tablet 0   Facility-Administered Medications Prior to Visit  Medication Dose Route Frequency Provider Last Rate Last Admin   methylPREDNISolone acetate (DEPO-MEDROL) injection 80 mg  80 mg Intra-articular Once [name], Spencer, MD         Per HPI unless specifically indicated in ROS section below Review of Systems  Objective:  BP 134/82   Pulse 60   Temp 97.6 F (36.4 C) (Temporal)   Ht 5' 9.25" (1.759 m)   Wt 265 lb 7 oz (120.4 kg)   SpO2 97%   BMI 38.92 kg/m   Wt Readings from Last 3 Encounters:  02/19/21 265 lb 7 oz (120.4 kg)  12/09/20 260 lb (117.9 kg)  11/11/20 282 lb 1 oz (127.9 kg)      Physical Exam Vitals and nursing note reviewed.  Constitutional:      Appearance: Normal appearance. He is obese. He is not ill-appearing.  Cardiovascular:     Rate and Rhythm: Normal rate and regular rhythm.     Pulses: Normal pulses.     Heart sounds: Murmur (2/6 systolic USB) heard.  Pulmonary:     Effort: Pulmonary effort is normal. No respiratory distress.     Breath sounds: Normal breath sounds. No wheezing, rhonchi or rales.  Musculoskeletal:     Right lower leg: Edema (1+) present.     Left lower leg: Edema (1+) present.     Comments: 1+ DP bilaterally  Skin:    General: Skin is warm and dry.     Findings: No erythema or rash.     Comments: Tender induration to R lateral distal thigh without erythema or fluctuance   Neurological:     Mental Status: He is alert.  Psychiatric:        Mood and Affect: Mood normal.        Behavior: Behavior normal.      Results for orders placed or performed in visit on 11/11/20  CBC with Differential/Platelet  Result Value Ref Range   WBC 10.1 4.0 - 10.5 K/uL   RBC 5.13 4.22 - 5.81  Mil/uL   Hemoglobin 15.7 13.0 - 17.0 g/dL   HCT 46.8 39.0 - 52.0 %   MCV 91.1 78.0 - 100.0 fl   MCHC 33.5 30.0 - 36.0 g/dL   RDW 15.1 11.5 - 15.5 %   Platelets 166.0 150.0 - 400.0 K/uL   Neutrophils Relative % 79.9 (H) 43.0 - 77.0 %   Lymphocytes Relative 8.0 (L) 12.0 - 46.0 %   Monocytes Relative 10.8 3.0 - 12.0 %   Eosinophils  Relative 0.7 0.0 - 5.0 %   Basophils Relative 0.6 0.0 - 3.0 %   Neutro Abs 8.1 (H) 1.4 - 7.7 K/uL   Lymphs Abs 0.8 0.7 - 4.0 K/uL   Monocytes Absolute 1.1 (H) 0.1 - 1.0 K/uL   Eosinophils Absolute 0.1 0.0 - 0.7 K/uL   Basophils Absolute 0.1 0.0 - 0.1 K/uL  Basic metabolic panel  Result Value Ref Range   Sodium 137 135 - 145 mEq/L   Potassium 3.6 3.5 - 5.1 mEq/L   Chloride 98 96 - 112 mEq/L   CO2 29 19 - 32 mEq/L   Glucose, Bld 173 (H) 70 - 99 mg/dL   BUN 20 6 - 23 mg/dL   Creatinine, Ser 1.45 0.40 - 1.50 mg/dL   GFR 48.10 (L) >60.00 mL/min   Calcium 8.8 8.4 - 10.5 mg/dL  TSH  Result Value Ref Range   TSH 2.19 0.35 - 4.50 uIU/mL  Brain natriuretic peptide  Result Value Ref Range   Pro B Natriuretic peptide (BNP) 228.0 (H) 0.0 - 100.0 pg/mL  Troponin I (High Sensitivity)  Result Value Ref Range   High Sens Troponin I 20 (HH) 2 - 17 ng/L    Assessment & Plan:  This visit occurred during the SARS-CoV-2 public health emergency.  Safety protocols were in place, including screening questions prior to the visit, additional usage of staff PPE, and extensive cleaning of exam room while observing appropriate contact time as indicated for disinfecting solutions.   Problem List Items Addressed This Visit     Osteoarthritis    Chronic, longstanding pain to multiple joints.  Discussed relative contraindication of NSAIDs with anticoagulation.  He is doing well on tylenol PRN, will let me know if desires stronger medication (ie tramadol) - deferred for now given concern over side effects namely constipation.  He may continue continue using topical voltaren gel.         Relevant Medications   acetaminophen (TYLENOL 8 HOUR ARTHRITIS PAIN) 650 MG CR tablet   Male hypogonadism    ?myositis after recent testosterone injection - no signs of infection, actually improving with time.  Has been injecting into anterolateral thighs - recommend testosterone injections into gluteus medius - wife will look into administering these shots.        Moderate mitral insufficiency   Pedal edema - Primary    Not CHF related - anticipate CVI contributing. given palpable pulses, reasonable to try compression stockings - Rx written out today to take to local DME store.        Chronic atrial fibrillation [facility])    Sees cardiology, on eliquis Discussed interaction of eliquis with NSAIDs - he has stopped oral diclofenac       Sick sinus syndrome [facility])    Sees cardiology, discussing pacemaker placement.        Other Visit Diagnoses     Special screening for malignant neoplasms, colon       Relevant Orders   Cologuard        Meds ordered this encounter  Medications   acetaminophen (TYLENOL 8 HOUR ARTHRITIS PAIN) 650 MG CR tablet    Sig: Take 2 tablets (1,300 mg total) by mouth 2 (two) times daily as needed for pain.    Orders Placed This Encounter  Procedures   Cologuard     Patient Instructions  Stay off diclofenac. Continue tylenol '1300mg'$  in the morning with extra in evening if needed.  Continue lasix '40mg'$  daily for now, once leg swelling  has resolved, change to as needed.  We have placed order for cologuard. Let us know if you don't receive  communication over next 1-2 weeks. Good to see you today Try knee high compression stockings  Testosterone injections should go into gluteus medius alternating site each time  Follow up plan: Return if symptoms worsen or fail to improve.  [name], MD

## 2021-02-20 DIAGNOSIS — I495 Sick sinus syndrome: Secondary | ICD-10-CM | POA: Insufficient documentation

## 2021-02-20 NOTE — Assessment & Plan Note (Signed)
Not CHF related - anticipate CVI contributing. given palpable pulses, reasonable to try compression stockings - Rx written out today to take to local DME store.

## 2021-02-20 NOTE — Assessment & Plan Note (Signed)
Sees cardiology, discussing pacemaker placement.

## 2021-02-20 NOTE — Assessment & Plan Note (Addendum)
Chronic, longstanding pain to multiple joints.  Discussed relative contraindication of NSAIDs with anticoagulation.  He is doing well on tylenol PRN, will let me know if desires stronger medication (ie tramadol) - deferred for now given concern over side effects namely constipation.  He may continue continue using topical voltaren gel.

## 2021-02-20 NOTE — Assessment & Plan Note (Addendum)
?  myositis after recent testosterone injection - no signs of infection, actually improving with time.  Has been injecting into anterolateral thighs - recommend testosterone injections into gluteus medius - wife will look into administering these shots.

## 2021-02-20 NOTE — Assessment & Plan Note (Addendum)
Sees cardiology, on eliquis Discussed interaction of eliquis with NSAIDs - he has stopped oral diclofenac

## 2021-02-24 DIAGNOSIS — M722 Plantar fascial fibromatosis: Secondary | ICD-10-CM | POA: Diagnosis not present

## 2021-02-24 DIAGNOSIS — L600 Ingrowing nail: Secondary | ICD-10-CM | POA: Diagnosis not present

## 2021-02-24 DIAGNOSIS — M7732 Calcaneal spur, left foot: Secondary | ICD-10-CM | POA: Diagnosis not present

## 2021-02-28 DIAGNOSIS — Z1211 Encounter for screening for malignant neoplasm of colon: Secondary | ICD-10-CM | POA: Diagnosis not present

## 2021-03-06 ENCOUNTER — Telehealth: Payer: Self-pay

## 2021-03-06 NOTE — Chronic Care Management (AMB) (Addendum)
Chronic Care Management Pharmacy Assistant   Name: Aaron Mitchell  MRN: [id number] DOB: 02-27-1946  Reason for Encounter: Medication Adherence and Delivery Coordination   Recent office visits:  02/19/21 - PCP - Pt presented for edema and chronic conditions. Due to drug interaction with Eliquis, stop diclofenac. Continue tylenol 1000 mg in the morning with extra in evening if needed.  Continue lasix '40mg'$  daily for now, once leg swelling has resolved, change to as needed.   Recent consult visits:  02/17/21 - Cardiology - Pt presented for AFIB. Continue apixaban. Recommend pacemaker if symptomatic.   Hospital visits:  None in previous 6 months  Medications: Outpatient Encounter Medications as of 03/06/2021  Medication Sig   acetaminophen (TYLENOL 8 HOUR ARTHRITIS PAIN) 650 MG CR tablet Take 2 tablets (1,300 mg total) by mouth 2 (two) times daily as needed for pain.   albuterol (PROVENTIL) (2.5 MG/3ML) 0.083% nebulizer solution Take 3 mLs (2.5 mg total) by nebulization every 6 (six) hours as needed for wheezing or shortness of breath.   albuterol (VENTOLIN HFA) 108 (90 Base) MCG/ACT inhaler Inhale 2 puffs into the lungs every 6 (six) hours as needed for wheezing or shortness of breath.   allopurinol (ZYLOPRIM) 300 MG tablet Take 1 tablet (300 mg total) by mouth daily.   amLODipine (NORVASC) 5 MG tablet Take 1 tablet (5 mg total) by mouth daily.   apixaban (ELIQUIS) 5 MG TABS tablet Take 5 mg by mouth 2 (two) times daily.   atorvastatin (LIPITOR) 40 MG tablet Take 1 tablet (40 mg total) by mouth daily.   diclofenac sodium (VOLTAREN) 1 % GEL Apply 2 g topically daily as needed. Apply to both hands.   fluticasone (FLONASE) 50 MCG/ACT nasal spray SHAKE LIQUID AND USE 2 SPRAYS IN EACH NOSTRIL DAILY   furosemide (LASIX) 40 MG tablet Take 1 tablet (40 mg total) by mouth daily as needed for edema.   metaxalone (SKELAXIN) 800 MG tablet Take 1 tablet (800 mg total) by mouth 3 (three) times daily as  needed for muscle spasms.   Multiple Vitamins tablet Take 1 tablet by mouth daily.   sildenafil (VIAGRA) 100 MG tablet Take by mouth.   tadalafil (CIALIS) 20 MG tablet Take 20 mg by mouth daily as needed for erectile dysfunction.   testosterone cypionate (DEPOTESTOSTERONE CYPIONATE) 200 MG/ML injection Inject 0.5 mLs (100 mg total) into the muscle once a week.   tiotropium (SPIRIVA HANDIHALER) 18 MCG inhalation capsule Place 1 capsule (18 mcg total) into inhaler and inhale daily.   tiZANidine (ZANAFLEX) 4 MG tablet Take 0.5-1 tablets (2-4 mg total) by mouth 3 (three) times daily as needed for muscle spasms (R jaw pain - sedation precautions).   traMADol (ULTRAM) 50 MG tablet Take 1 tablet (50 mg total) by mouth every 12 (twelve) hours as needed for moderate pain.   triamterene-hydrochlorothiazide (MAXZIDE-25) 37.5-25 MG tablet Take 1 tablet by mouth daily.   zolpidem (AMBIEN) 10 MG tablet TAKE 1 TABLET(10 MG) BY MOUTH AT BEDTIME AS NEEDED FOR SLEEP   Facility-Administered Encounter Medications as of 03/06/2021  Medication   methylPREDNISolone acetate (DEPO-MEDROL) injection 80 mg   BP Readings from Last 3 Encounters:  02/19/21 134/82  12/09/20 (!) 150/60  11/11/20 128/68    Lab Results  Component Value Date   HGBA1C 6.5 10/10/2020     Patient obtains medications through Adherence Packaging  90 Days   Last adherence delivery date: 02/13/21   Patient is due for next adherence delivery on: 03/17/2021  Spoke with patient on 03/07/21 reviewed medications and coordinated delivery.  This delivery to include: Vials  90 Days  PRN/VIAL medications: Furosemide '40mg'$  take 1 tablet daily  Fluticasone 68mg/act 2 sprays each nostril daily  Patient declined the following medications this month: Diclofenac '75mg'$  take 1 tablet 2 times daily (stopped due to blood thinner) Allopurinol 300 mg 1 tablet before breakfast (90ds 02/13/21) Amlodipine 5 mg 1 tablet before breakfast (90ds  02/13/21) Atorvastatin 40 mg 1 tablet before breakfast (90ds 02/13/21) Triamterene 37.5 mg-HCTZ 25 mg 1 tablet  before breakfast (90ds 02/13/21) Eliquis '5mg'$  take 1 tablet breakfast 1 tablet evening meal (90ds 01/09/21)  Refills requested from PCP include: furosemide  Confirmed delivery date of 03/17/2021, advised patient that pharmacy will contact him the morning of delivery.  Recent blood pressure readings are as follows: No readings available due to recent travel.  Recent blood glucose readings are as follows: No readings available. Patient reports he would like to inquire about getting a glucose monitor.  Follow-Up:  Coordination of Enhanced Pharmacy Services and Pharmacist Review  MDebbora Dus CPP notified  VAvel Sensor CRedmondAssistant [phone] I have reviewed the care management and care coordination activities outlined in this encounter and I am certifying that I agree with the content of this note. No further action required.  MDebbora Dus PharmD Clinical Pharmacist LVernonPrimary Care at [phone]

## 2021-03-07 ENCOUNTER — Encounter: Payer: Self-pay | Admitting: Family Medicine

## 2021-03-07 DIAGNOSIS — G4733 Obstructive sleep apnea (adult) (pediatric): Secondary | ICD-10-CM

## 2021-03-07 LAB — COLOGUARD: Cologuard: POSITIVE — AB

## 2021-03-08 ENCOUNTER — Other Ambulatory Visit: Payer: Self-pay | Admitting: Family Medicine

## 2021-03-08 DIAGNOSIS — R195 Other fecal abnormalities: Secondary | ICD-10-CM

## 2021-03-10 NOTE — Telephone Encounter (Signed)
Order written in chart.  Plz send to [facility] as per above.

## 2021-03-10 NOTE — Telephone Encounter (Signed)
Faxed order to [facility] ([place]) at [phone].

## 2021-03-11 ENCOUNTER — Telehealth: Payer: Self-pay | Admitting: Gastroenterology

## 2021-03-11 ENCOUNTER — Telehealth (INDEPENDENT_AMBULATORY_CARE_PROVIDER_SITE_OTHER): Payer: Self-pay | Admitting: Gastroenterology

## 2021-03-11 DIAGNOSIS — R195 Other fecal abnormalities: Secondary | ICD-10-CM

## 2021-03-11 MED ORDER — PEG 3350-KCL-NA BICARB-NACL 420 G PO SOLR: 4000.0000 mL | Freq: Once | ORAL | 0 refills | Status: AC

## 2021-03-11 NOTE — Telephone Encounter (Signed)
Patient had questions regarding screening questions. Reviewed the note and explained to patient I may have accidentally hit the yes button but I did put a note in stating he had a Positive Cologuard test. I corrected the answer on the screening note.

## 2021-03-11 NOTE — Telephone Encounter (Signed)
Patient wants a call back from [place] to discuss incorrect information in his chart.Clinical staff will follow up with patient.

## 2021-03-11 NOTE — Progress Notes (Addendum)
Gastroenterology Pre-Procedure Review  Request Date: 08/25/2 Requesting Physician: Dr. Allen Norris  PATIENT REVIEW QUESTIONS: The patient responded to the following health history questions as indicated:    1. Are you having any GI issues? no 2. Do you have a personal history of Polyps? No but Positive Cologuard test 3. Do you have a family history of Colon Cancer or Polyps? no 4. Diabetes Mellitus? yes (Type II) 5. Joint replacements in the past 12 months?no 6. Major health problems in the past 3 months?no 7. Any artificial heart valves, MVP, or defibrillator?no    MEDICATIONS & ALLERGIES:    Patient reports the following regarding taking any anticoagulation/antiplatelet therapy:   Plavix, Coumadin, Eliquis, Xarelto, Lovenox, Pradaxa, Brilinta, or Effient? yes (Eliquis '5mg'$ . Pt states he stop taking his blood thinner meds on 03/10/21. Will start back after procedure.) Aspirin? no  Patient confirms/reports the following medications:  Current Outpatient Medications  Medication Sig Dispense Refill   acetaminophen (TYLENOL 8 HOUR ARTHRITIS PAIN) 650 MG CR tablet Take 2 tablets (1,300 mg total) by mouth 2 (two) times daily as needed for pain.     albuterol (PROVENTIL) (2.5 MG/3ML) 0.083% nebulizer solution Take 3 mLs (2.5 mg total) by nebulization every 6 (six) hours as needed for wheezing or shortness of breath. 90 mL 3   albuterol (VENTOLIN HFA) 108 (90 Base) MCG/ACT inhaler Inhale 2 puffs into the lungs every 6 (six) hours as needed for wheezing or shortness of breath. 3 Inhaler 3   allopurinol (ZYLOPRIM) 300 MG tablet Take 1 tablet (300 mg total) by mouth daily. 90 tablet 3   amLODipine (NORVASC) 5 MG tablet Take 1 tablet (5 mg total) by mouth daily. 90 tablet 3   apixaban (ELIQUIS) 5 MG TABS tablet Take 5 mg by mouth 2 (two) times daily.     atorvastatin (LIPITOR) 40 MG tablet Take 1 tablet (40 mg total) by mouth daily. 90 tablet 3   diclofenac sodium (VOLTAREN) 1 % GEL Apply 2 g topically  daily as needed. Apply to both hands.     fluticasone (FLONASE) 50 MCG/ACT nasal spray SHAKE LIQUID AND USE 2 SPRAYS IN EACH NOSTRIL DAILY 48 g 2   furosemide (LASIX) 40 MG tablet Take 1 tablet (40 mg total) by mouth daily as needed for edema. 30 tablet 1   metaxalone (SKELAXIN) 800 MG tablet Take 1 tablet (800 mg total) by mouth 3 (three) times daily as needed for muscle spasms. 90 tablet 0   Multiple Vitamins tablet Take 1 tablet by mouth daily.     sildenafil (VIAGRA) 100 MG tablet Take by mouth.     tadalafil (CIALIS) 20 MG tablet Take 20 mg by mouth daily as needed for erectile dysfunction.     testosterone cypionate (DEPOTESTOSTERONE CYPIONATE) 200 MG/ML injection Inject 0.5 mLs (100 mg total) into the muscle once a week. 10 mL 0   tiotropium (SPIRIVA HANDIHALER) 18 MCG inhalation capsule Place 1 capsule (18 mcg total) into inhaler and inhale daily. 90 capsule 3   tiZANidine (ZANAFLEX) 4 MG tablet Take 0.5-1 tablets (2-4 mg total) by mouth 3 (three) times daily as needed for muscle spasms (R jaw pain - sedation precautions). 40 tablet 0   traMADol (ULTRAM) 50 MG tablet Take 1 tablet (50 mg total) by mouth every 12 (twelve) hours as needed for moderate pain. 30 tablet 0   triamterene-hydrochlorothiazide (MAXZIDE-25) 37.5-25 MG tablet Take 1 tablet by mouth daily. 90 tablet 3   zolpidem (AMBIEN) 10 MG tablet TAKE 1 TABLET(10  MG) BY MOUTH AT BEDTIME AS NEEDED FOR SLEEP 30 tablet 0   Current Facility-Administered Medications  Medication Dose Route Frequency Provider Last Rate Last Admin   methylPREDNISolone acetate (DEPO-MEDROL) injection 80 mg  80 mg Intra-articular Once [name], Spencer, MD        Patient confirms/reports the following allergies:  Allergies  Allergen Reactions   Codeine Other (See Comments)    Other reaction(s): Other (See Comments) HEADACHES Causes headaches when he stops liquid Codeine-based products   Ace Inhibitors Cough    Cough also present with ARB (losartan)     No orders of the defined types were placed in this encounter.   AUTHORIZATION INFORMATION Primary Insurance: 1D#: Group #:  Secondary Insurance: 1D#: Group #:  SCHEDULE INFORMATION: Date: 03/20/21 Time: Location: [place]

## 2021-03-12 NOTE — Telephone Encounter (Signed)
Received faxed CPAP order form from [place].  Placed form in Dr. Synthia Innocent box.

## 2021-03-14 NOTE — Telephone Encounter (Signed)
Form filled and in Lisa's box.  

## 2021-03-14 NOTE — Telephone Encounter (Signed)
Faxed form to [employer].

## 2021-03-17 ENCOUNTER — Encounter: Payer: Self-pay | Admitting: Family Medicine

## 2021-03-17 ENCOUNTER — Other Ambulatory Visit: Payer: Self-pay

## 2021-03-18 ENCOUNTER — Telehealth: Payer: Self-pay

## 2021-03-18 NOTE — Telephone Encounter (Signed)
Patient has been informed of blood thinner clearance. He is to stop Eliquis and aspirin 3 days prior to procedure. See telemedicine note regarding statement patient gave during the screening regarding the Eliquis.

## 2021-03-20 ENCOUNTER — Ambulatory Visit: Payer: PPO | Admitting: Anesthesiology

## 2021-03-20 ENCOUNTER — Ambulatory Visit
Admission: RE | Admit: 2021-03-20 | Discharge: 2021-03-20 | Disposition: A | Discharge status: Home / Self Care | Payer: PPO | Attending: Gastroenterology | Admitting: Gastroenterology

## 2021-03-20 ENCOUNTER — Encounter: Payer: Self-pay | Admitting: Gastroenterology

## 2021-03-20 ENCOUNTER — Encounter
Admission: RE | Disposition: A | Discharge status: Home / Self Care | Payer: Self-pay | Source: Home / Self Care | Attending: Gastroenterology

## 2021-03-20 DIAGNOSIS — Z8249 Family history of ischemic heart disease and other diseases of the circulatory system: Secondary | ICD-10-CM | POA: Diagnosis not present

## 2021-03-20 DIAGNOSIS — Z79899 Other long term (current) drug therapy: Secondary | ICD-10-CM | POA: Insufficient documentation

## 2021-03-20 DIAGNOSIS — D124 Benign neoplasm of descending colon: Secondary | ICD-10-CM | POA: Insufficient documentation

## 2021-03-20 DIAGNOSIS — D122 Benign neoplasm of ascending colon: Secondary | ICD-10-CM | POA: Diagnosis not present

## 2021-03-20 DIAGNOSIS — Z1211 Encounter for screening for malignant neoplasm of colon: Secondary | ICD-10-CM | POA: Diagnosis not present

## 2021-03-20 DIAGNOSIS — K635 Polyp of colon: Secondary | ICD-10-CM | POA: Diagnosis not present

## 2021-03-20 DIAGNOSIS — E119 Type 2 diabetes mellitus without complications: Secondary | ICD-10-CM | POA: Insufficient documentation

## 2021-03-20 DIAGNOSIS — Z87891 Personal history of nicotine dependence: Secondary | ICD-10-CM | POA: Diagnosis not present

## 2021-03-20 DIAGNOSIS — R195 Other fecal abnormalities: Secondary | ICD-10-CM | POA: Diagnosis not present

## 2021-03-20 DIAGNOSIS — Z888 Allergy status to other drugs, medicaments and biological substances status: Secondary | ICD-10-CM | POA: Diagnosis not present

## 2021-03-20 DIAGNOSIS — Z885 Allergy status to narcotic agent status: Secondary | ICD-10-CM | POA: Insufficient documentation

## 2021-03-20 DIAGNOSIS — I10 Essential (primary) hypertension: Secondary | ICD-10-CM | POA: Insufficient documentation

## 2021-03-20 DIAGNOSIS — D123 Benign neoplasm of transverse colon: Secondary | ICD-10-CM | POA: Diagnosis not present

## 2021-03-20 DIAGNOSIS — K648 Other hemorrhoids: Secondary | ICD-10-CM | POA: Diagnosis not present

## 2021-03-20 DIAGNOSIS — Z6835 Body mass index (BMI) 35.0-35.9, adult: Secondary | ICD-10-CM | POA: Insufficient documentation

## 2021-03-20 DIAGNOSIS — Z833 Family history of diabetes mellitus: Secondary | ICD-10-CM | POA: Diagnosis not present

## 2021-03-20 DIAGNOSIS — G4730 Sleep apnea, unspecified: Secondary | ICD-10-CM | POA: Diagnosis not present

## 2021-03-20 HISTORY — PX: COLONOSCOPY WITH PROPOFOL: SHX5780

## 2021-03-20 SURGERY — COLONOSCOPY WITH PROPOFOL
Anesthesia: General

## 2021-03-20 MED ORDER — DEXMEDETOMIDINE (PRECEDEX) IN NS 20 MCG/5ML (4 MCG/ML) IV SYRINGE
PREFILLED_SYRINGE | INTRAVENOUS | Status: DC | PRN
  Administered 2021-03-20: 8 ug via INTRAVENOUS

## 2021-03-20 MED ORDER — PROPOFOL 500 MG/50ML IV EMUL
INTRAVENOUS | Status: AC
  Filled 2021-03-20: qty 50

## 2021-03-20 MED ORDER — PROPOFOL 10 MG/ML IV BOLUS
INTRAVENOUS | Status: DC | PRN
  Administered 2021-03-20: 60 mg via INTRAVENOUS

## 2021-03-20 MED ORDER — PROPOFOL 500 MG/50ML IV EMUL
INTRAVENOUS | Status: DC | PRN
  Administered 2021-03-20: 150 ug/kg/min via INTRAVENOUS

## 2021-03-20 MED ORDER — DEXMEDETOMIDINE HCL IN NACL 200 MCG/50ML IV SOLN
INTRAVENOUS | Status: AC
  Filled 2021-03-20: qty 50

## 2021-03-20 MED ORDER — SODIUM CHLORIDE 0.9 % IV SOLN: INTRAVENOUS | Status: DC

## 2021-03-20 MED ORDER — LIDOCAINE HCL (CARDIAC) PF 100 MG/5ML IV SOSY
PREFILLED_SYRINGE | INTRAVENOUS | Status: DC | PRN
  Administered 2021-03-20: 50 mg via INTRAVENOUS

## 2021-03-20 NOTE — Op Note (Signed)
[facility] Gastroenterology Patient Name: Aaron Mitchell Procedure Date: 03/20/2021 8:33 AM MRN: [id number] Account #: [id number] Date of Birth: December 30, 1945 Admit Type: Outpatient Age: 75 Room: [facility] ENDO ROOM 4 Gender: Male Note Status: Finalized Procedure:             Colonoscopy Indications:           Positive Cologuard test Providers:             Lucilla Lame MD, MD Referring MD:          [name] (Referring MD) Medicines:             Propofol per Anesthesia Complications:         No immediate complications. Procedure:             Pre-Anesthesia Assessment:                        - Prior to the procedure, a History and Physical was                         performed, and patient medications and allergies were                         reviewed. The patient's tolerance of previous                         anesthesia was also reviewed. The risks and benefits                         of the procedure and the sedation options and risks                         were discussed with the patient. All questions were                         answered, and informed consent was obtained. Prior                         Anticoagulants: The patient has taken no previous                         anticoagulant or antiplatelet agents. ASA Grade                         Assessment: II - A patient with mild systemic disease.                         After reviewing the risks and benefits, the patient                         was deemed in satisfactory condition to undergo the                         procedure.                        After obtaining informed consent, the colonoscope was  passed under direct vision. Throughout the procedure,                         the patient's blood pressure, pulse, and oxygen                         saturations were monitored continuously. The                         Colonoscope was introduced through the anus and                          advanced to the the cecum, identified by appendiceal                         orifice and ileocecal valve. The colonoscopy was                         performed without difficulty. The patient tolerated                         the procedure well. The quality of the bowel                         preparation was fair. Findings:      The perianal and digital rectal examinations were normal.      Non-bleeding internal hemorrhoids were found during retroflexion. The       hemorrhoids were Grade II (internal hemorrhoids that prolapse but reduce       spontaneously).      Three sessile polyps were found in the descending colon. The polyps were       3 to 5 mm in size. These polyps were removed with a cold snare.       Resection and retrieval were complete.      A 5 mm polyp was found in the ascending colon. The polyp was sessile.       The polyp was removed with a cold snare. Resection and retrieval were       complete.      A 10 mm polyp was found in the ascending colon. The polyp was sessile.       Biopsies were taken with a cold forceps for histology.      A 2 mm polyp was found in the transverse colon. The polyp was sessile.       The polyp was removed with a cold biopsy forceps. Resection and       retrieval were complete. Impression:            - Preparation of the colon was fair.                        - Non-bleeding internal hemorrhoids.                        - Three 3 to 5 mm polyps in the descending colon,                         removed with a cold snare. Resected and retrieved.                        -  One 5 mm polyp in the ascending colon, removed with                         a cold snare. Resected and retrieved.                        - One 10 mm polyp in the ascending colon. Biopsied.                        - One 2 mm polyp in the transverse colon, removed with                         a cold biopsy forceps. Resected and retrieved. Recommendation:        - Discharge  patient to home.                        - Resume previous diet.                        - Continue present medications.                        - Await pathology results.                        - Repeat colonoscopy in 5 years for surveillance if                         adenomatous otherwise 10 years..                        - The ascening large polyp was biopsied and will await                         pathology. Procedure Code(s):     --- Professional ---                        [id number], Colonoscopy, flexible; with removal of                         tumor(s), polyp(s), or other lesion(s) by snare                         technique                        45380, 72, Colonoscopy, flexible; with biopsy, single                         or multiple Diagnosis Code(s):     --- Professional ---                        R19.5, Other fecal abnormalities                        K63.5, Polyp of colon CPT copyright 2019 American Medical Association. All rights reserved. The codes documented in this report are preliminary and upon coder review may  be revised to meet current compliance requirements. Lucilla Lame MD, MD 03/20/2021 9:02:50  AM This report has been signed electronically. Number of Addenda: 0 Note Initiated On: 03/20/2021 8:33 AM Scope Withdrawal Time: 0 hours 8 minutes 28 seconds  Total Procedure Duration: 0 hours 15 minutes 41 seconds  Estimated Blood Loss:  Estimated blood loss: none.      [facility]

## 2021-03-20 NOTE — Transfer of Care (Signed)
Immediate Anesthesia Transfer of Care Note  Patient: Aaron Mitchell  Procedure(s) Performed: COLONOSCOPY WITH PROPOFOL  Patient Location: PACU  Anesthesia Type:General  Level of Consciousness: drowsy and patient cooperative  Airway & Oxygen Therapy: Patient Spontanous Breathing  Post-op Assessment: Report given to RN and Post -op Vital signs reviewed and stable  Post vital signs: Reviewed and stable  Last Vitals:  Vitals Value Taken Time  BP 126/79 03/20/21 0903  Temp 36.3 C 03/20/21 0902  Pulse 65 03/20/21 0903  Resp 17 03/20/21 0903  SpO2 99 % 03/20/21 0903  Vitals shown include unvalidated device data.  Last Pain:  Vitals:   03/20/21 0902  TempSrc: Temporal  PainSc: Asleep         Complications: No notable events documented.

## 2021-03-20 NOTE — Anesthesia Postprocedure Evaluation (Signed)
Anesthesia Post Note  Patient: Aaron Mitchell  Procedure(s) Performed: COLONOSCOPY WITH PROPOFOL  Patient location during evaluation: Endoscopy Anesthesia Type: General Level of consciousness: awake and alert Pain management: pain level controlled Vital Signs Assessment: post-procedure vital signs reviewed and stable Respiratory status: spontaneous breathing, nonlabored ventilation, respiratory function stable and patient connected to nasal cannula oxygen Cardiovascular status: blood pressure returned to baseline and stable Postop Assessment: no apparent nausea or vomiting Anesthetic complications: no   No notable events documented.   Last Vitals:  Vitals:   03/20/21 0922 03/20/21 0932  BP: 128/61 137/76  Pulse:    Resp:    Temp:    SpO2:      Last Pain:  Vitals:   03/20/21 0932  TempSrc:   PainSc: 0-No pain                 Martha Clan

## 2021-03-20 NOTE — Anesthesia Procedure Notes (Signed)
Procedure Name: MAC Date/Time: 03/20/2021 8:40 AM Performed by: Jerrye Noble, CRNA Pre-anesthesia Checklist: Patient identified, Emergency Drugs available, Suction available and Patient being monitored Patient Re-evaluated:Patient Re-evaluated prior to induction Oxygen Delivery Method: Supernova nasal CPAP

## 2021-03-20 NOTE — H&P (Signed)
Lucilla Lame, MD [facility] [address], [place], [place] [address] Phone:[phone] Fax : [phone]  Primary Care Physician:  [name], MD Primary Gastroenterologist:  Dr. Allen Norris  Pre-Procedure History & Physical: HPI:  Aaron Mitchell is a 75 y.o. male is here for an colonoscopy.   Past Medical History:  Diagnosis Date   Achilles tendinitis    Left leg   Borderline diabetes mellitus    Childhood asthma    Degenerative disc disease, lumbar    rec against surgery   Erectile dysfunction    GI bleed 12/2015   hospitalization   Gout    Hypertension    Ileus ([place]) 12/2015   hospitalization    Male hypogonadism    followed by urology on T injections ([place])   Seasonal allergies    pollen   Severe obesity (BMI >= 40) (HCC)    Shoulder bursitis    Sleep apnea    CPAP    Past Surgical History:  Procedure Laterality Date   ADENOIDECTOMY     CARPAL TUNNEL RELEASE Right 2015   CATARACT EXTRACTION W/PHACO Right 07/15/2016   Procedure: CATARACT EXTRACTION PHACO AND INTRAOCULAR LENS PLACEMENT ([place]);  Surgeon: Leandrew Koyanagi, MD;  Location: [place];  Service: Ophthalmology;  Laterality: Right;  TORIC sleep apnea   CATARACT EXTRACTION W/PHACO Left 08/05/2016   Procedure: CATARACT EXTRACTION PHACO AND INTRAOCULAR LENS PLACEMENT (I[facility]) toric;  Surgeon: Leandrew Koyanagi, MD;  Location: [place];  Service: Ophthalmology;  Laterality: Left;  left sleep apnea toric   COLONOSCOPY  10/2012   rpt 5 yrs ([facility])   EYE SURGERY     Lens replacement 07/12/2016,08/05/2016   LUMBAR EPIDURAL INJECTION Bilateral 04/2017, 12/2017   S1 transforaminal ESI ([place])   RHINOPLASTY     TONSILLECTOMY  ?1954   TRIGGER FINGER RELEASE Right 2015   4th    Prior to Admission medications   Medication Sig Start Date End Date Taking? Authorizing Provider  albuterol (VENTOLIN HFA) 108 (90 Base) MCG/ACT inhaler Inhale 2 puffs into the lungs every 6 (six)  hours as needed for wheezing or shortness of breath. 11/25/18  Yes [name], MD  amLODipine (NORVASC) 5 MG tablet Take 1 tablet (5 mg total) by mouth daily. 11/05/20  Yes [name], MD  fluticasone  [facility]) 50 MCG/ACT nasal spray SHAKE LIQUID AND USE 2 SPRAYS IN [facility] NOSTRIL DAILY 02/12/21  Yes [name], MD  furosemide (LASIX) 40 MG tablet Take 1 tablet (40 mg total) by mouth daily as needed for edema. 02/13/21  Yes [name], MD  metaxalone (SKELAXIN) 800 MG tablet Take 1 tablet (800 mg total) by mouth 3 (three) times daily as needed for muscle spasms. 09/13/20  Yes [name], MD  Multiple Vitamins tablet Take 1 tablet by mouth daily.   Yes [provider]  tiotropium (SPIRIVA HANDIHALER) 18 MCG inhalation capsule Place 1 capsule (18 mcg total) into inhaler and inhale daily. 09/15/19  Yes [name], MD  tiZANidine (ZANAFLEX) 4 MG tablet Take 0.5-1 tablets (2-4 mg total) by mouth 3 (three) times daily as needed for muscle spasms (R jaw pain - sedation precautions). 10/16/20  Yes [name], MD  traMADol (ULTRAM) 50 MG tablet Take 1 tablet (50 mg total) by mouth every 12 (twelve) hours as needed for moderate pain. 02/13/20  Yes [name], MD  zolpidem (AMBIEN) 10 MG tablet TAKE 1 TABLET(10 MG) BY MOUTH AT BEDTIME AS NEEDED FOR SLEEP 05/19/20  Yes [name], MD  acetaminophen (TYLENOL 8 HOUR ARTHRITIS PAIN) 650 MG CR tablet Take 2 tablets (1,300 mg total) by mouth 2 (two) times daily as needed for pain. 02/19/21   [name], MD  albuterol (PROVENTIL) (2.5 MG/3ML) 0.083% nebulizer solution Take 3 mLs (2.5 mg total) by nebulization every 6 (six) hours as needed for wheezing or shortness of breath. 12/31/20   [name], MD  allopurinol (ZYLOPRIM) 300 MG tablet Take 1 tablet (300 mg total) by mouth daily. 11/12/20   [name], MD  apixaban (ELIQUIS) 5 MG TABS tablet Take 5 mg by mouth 2 (two) times daily.     [provider]  atorvastatin (LIPITOR) 40 MG tablet Take 1 tablet (40 mg total) by mouth daily. 11/12/20   [name], MD  diclofenac sodium (VOLTAREN) 1 % GEL Apply 2 g topically daily as needed. Apply to both hands.    [provider]  sildenafil (VIAGRA) 100 MG tablet Take by mouth.    [provider]  tadalafil (CIALIS) 20 MG tablet Take 20 mg by mouth daily as needed for erectile dysfunction.    [provider]  testosterone cypionate (DEPOTESTOSTERONE CYPIONATE) 200 MG/ML injection Inject 0.5 mLs (100 mg total) into the muscle once a week. 02/13/21   Stoioff, Ronda Fairly, MD  triamterene-hydrochlorothiazide (MAXZIDE-25) 37.5-25 MG tablet Take 1 tablet by mouth daily. 11/12/20   [name], MD    Allergies as of 03/11/2021 - Review Complete 03/11/2021  Allergen Reaction Noted   Codeine Other (See Comments) 10/04/2015   Ace inhibitors Cough 07/21/2018    Family History  Problem Relation Age of Onset   Alcohol abuse Father    Hypertension Father    AVM Father 69       hemorrhagic stroke   Diabetes Paternal Grandmother    Cancer Neg Hx    CAD Neg Hx    Prostate cancer Neg Hx    Bladder Cancer Neg Hx    Kidney cancer Neg Hx     Social History   Socioeconomic History   Marital status: Married    Spouse name: Not on file   Number of children: Not on file   Years of education: Not on file   Highest education level: Not on file  Occupational History   Not on file  Tobacco Use   Smoking status: Former    Types: Cigarettes    Quit date: 07/27/1978    Years since quitting: 42.6   Smokeless tobacco: Never   Tobacco comments:    Quit in 10/16/1978  Vaping Use   Vaping Use: Never used  Substance and Sexual Activity   Alcohol use: Yes    Alcohol/week: 5.0 standard drinks    Types: 5 Shots of liquor per week    Comment:     Drug use: No   Sexual activity: Not on file  Other Topics Concern   Not on file  Social History Narrative    Lives at home with wife [name]), dog passed away 2015-10-16   1 grown child   Occ: retired Clinical biochemist, was Chief Technology Officer    Edu: 2 yrs college   Activity: no regular exercise   Diet: good water, fruits daily, splenda iced tea   Social Determinants of Radio broadcast assistant Strain: Low Risk    Difficulty of Paying Living Expenses: Not hard at all  Food Insecurity: No Food Insecurity   Worried About Charity fundraiser in the Last Year: Never true   Ran  Out of Food in the Last Year: Never true  Transportation Needs: No Transportation Needs   Lack of Transportation (Medical): No   Lack of Transportation (Non-Medical): No  Physical Activity: Inactive   Days of Exercise per Week: 0 days   Minutes of Exercise per Session: 0 min  Stress: No Stress Concern Present   Feeling of Stress : Not at all  Social Connections: Not on file  Intimate Partner Violence: Not At Risk   Fear of Current or Ex-Partner: No   Emotionally Abused: No   Physically Abused: No   Sexually Abused: No    Review of Systems: See HPI, otherwise negative ROS  Physical Exam: BP 140/68   Pulse 64   Temp (!) 97.4 F (36.3 C) (Temporal)   Resp 20   Ht '5\' 10"'$  (1.778 m)   Wt 113.4 kg   SpO2 98%   BMI 35.87 kg/m  General:   Alert,  pleasant and cooperative in NAD Head:  Normocephalic and atraumatic. Neck:  Supple; no masses or thyromegaly. Lungs:  Clear throughout to auscultation.    Heart:  Regular rate and rhythm. Abdomen:  Soft, nontender and nondistended. Normal bowel sounds, without guarding, and without rebound.   Neurologic:  Alert and  oriented x4;  grossly normal neurologically.  Impression/Plan: Aaron Mitchell is here for an colonoscopy to be performed for positive Cologuard  Risks, benefits, limitations, and alternatives regarding  colonoscopy have been reviewed with the patient.  Questions have been answered.  All parties agreeable.   Lucilla Lame, MD  03/20/2021, 8:25 AM

## 2021-03-20 NOTE — Anesthesia Preprocedure Evaluation (Signed)
Anesthesia Evaluation  Patient identified by MRN, date of birth, ID band Patient awake    Reviewed: Allergy & Precautions, H&P , NPO status , Patient's Chart, lab work & pertinent test results, reviewed documented beta blocker date and time   History of Anesthesia Complications Negative for: history of anesthetic complications  Airway Mallampati: IV  TM Distance: >3 FB Neck ROM: full    Dental  (+) Caps, Dental Advidsory Given, Teeth Intact   Pulmonary neg pulmonary ROS, shortness of breath and with exertion, asthma (as a child) , sleep apnea and Continuous Positive Airway Pressure Ventilation , COPD,  COPD inhaler, neg recent URI, former smoker,    Pulmonary exam normal breath sounds clear to auscultation       Cardiovascular Exercise Tolerance: Poor hypertension, (-) angina(-) Past MI and (-) Cardiac Stents negative cardio ROS Normal cardiovascular exam+ dysrhythmias (LBBB) + Valvular Problems/Murmurs  Rhythm:regular Rate:Normal     Neuro/Psych negative neurological ROS  negative psych ROS   GI/Hepatic negative GI ROS, Neg liver ROS,   Endo/Other  diabetes (borderline)Morbid obesity  Renal/GU negative Renal ROS  negative genitourinary   Musculoskeletal   Abdominal   Peds  Hematology negative hematology ROS (+)   Anesthesia Other Findings Past Medical History: No date: Achilles tendinitis     Comment:  Left leg No date: Borderline diabetes mellitus No date: Childhood asthma No date: Degenerative disc disease, lumbar     Comment:  rec against surgery No date: Erectile dysfunction 12/2015: GI bleed     Comment:  hospitalization No date: Gout No date: Hypertension 12/2015: Ileus ([place])     Comment:  hospitalization  No date: Male hypogonadism     Comment:  followed by urology on T injections ([place]) No date: Seasonal allergies     Comment:  pollen No date: Severe obesity (BMI >= 40) (HCC) No date: Shoulder  bursitis No date: Sleep apnea     Comment:  CPAP   Reproductive/Obstetrics negative OB ROS                             Anesthesia Physical  Anesthesia Plan  ASA: 3  Anesthesia Plan: General   Post-op Pain Management:    Induction: Intravenous  PONV Risk Score and Plan: 2 and TIVA and Propofol infusion  Airway Management Planned: Natural Airway and Nasal Cannula  Additional Equipment:   Intra-op Plan:   Post-operative Plan:   Informed Consent: I have reviewed the patients History and Physical, chart, labs and discussed the procedure including the risks, benefits and alternatives for the proposed anesthesia with the patient or authorized representative who has indicated his/her understanding and acceptance.     Dental Advisory Given  Plan Discussed with: Anesthesiologist, CRNA and Surgeon  Anesthesia Plan Comments:         Anesthesia Quick Evaluation

## 2021-03-21 ENCOUNTER — Encounter: Payer: Self-pay | Admitting: Gastroenterology

## 2021-03-21 LAB — SURGICAL PATHOLOGY

## 2021-03-23 ENCOUNTER — Encounter: Payer: Self-pay | Admitting: Family Medicine

## 2021-03-31 ENCOUNTER — Other Ambulatory Visit: Payer: Self-pay | Admitting: Family Medicine

## 2021-04-02 ENCOUNTER — Encounter: Payer: Self-pay | Admitting: Family Medicine

## 2021-04-04 DIAGNOSIS — Z20822 Contact with and (suspected) exposure to covid-19: Secondary | ICD-10-CM | POA: Diagnosis not present

## 2021-04-07 ENCOUNTER — Telehealth: Payer: Self-pay

## 2021-04-07 NOTE — Progress Notes (Addendum)
Chronic Care Management Pharmacy Assistant   Name: Aaron Mitchell  MRN: [id number] DOB: [birth year]  Reason for Encounter: COPD Disease State   Recent office visits:  04/02/2021 - [name], MD - Patient called office for refill of Furosemide. Refill sent to [facility].   Recent consult visits:  03/11/2021 - Lucilla Lame, MD - Telemedicine visit - Patient presented for Positive colorectal cancer screening using Cologuard test. Start: polyethylene glycol-electrolytes (NULYTELY) 420 g solution.   Hospital visits:  Medication Reconciliation was completed by comparing discharge summary, patient's EMR and Pharmacy list, and upon discussion with patient. 03/20/2021 [facility] - Patient presented for a Colonoscopy with Propofol. Discharged same day.   Medications: Outpatient Encounter Medications as of 04/07/2021  Medication Sig   acetaminophen (TYLENOL 8 HOUR ARTHRITIS PAIN) 650 MG CR tablet Take 2 tablets (1,300 mg total) by mouth 2 (two) times daily as needed for pain.   albuterol (PROVENTIL) (2.5 MG/3ML) 0.083% nebulizer solution Take 3 mLs (2.5 mg total) by nebulization every 6 (six) hours as needed for wheezing or shortness of breath.   albuterol (VENTOLIN HFA) 108 (90 Base) MCG/ACT inhaler Inhale 2 puffs into the lungs every 6 (six) hours as needed for wheezing or shortness of breath.   allopurinol (ZYLOPRIM) 300 MG tablet Take 1 tablet (300 mg total) by mouth daily.   amLODipine (NORVASC) 5 MG tablet Take 1 tablet (5 mg total) by mouth daily.   apixaban (ELIQUIS) 5 MG TABS tablet Take 5 mg by mouth 2 (two) times daily.   atorvastatin (LIPITOR) 40 MG tablet Take 1 tablet (40 mg total) by mouth daily.   diclofenac sodium (VOLTAREN) 1 % GEL Apply 2 g topically daily as needed. Apply to both hands.   fluticasone (FLONASE) 50 MCG/ACT nasal spray SHAKE LIQUID AND USE 2 SPRAYS IN EACH NOSTRIL DAILY   furosemide (LASIX) 40 MG tablet TAKE ONE TABLET BY MOUTH ONCE  DAILY as needed FOR EDEMA   metaxalone (SKELAXIN) 800 MG tablet Take 1 tablet (800 mg total) by mouth 3 (three) times daily as needed for muscle spasms.   Multiple Vitamins tablet Take 1 tablet by mouth daily.   sildenafil (VIAGRA) 100 MG tablet Take by mouth.   tadalafil (CIALIS) 20 MG tablet Take 20 mg by mouth daily as needed for erectile dysfunction.   testosterone cypionate (DEPOTESTOSTERONE CYPIONATE) 200 MG/ML injection Inject 0.5 mLs (100 mg total) into the muscle once a week.   tiotropium (SPIRIVA HANDIHALER) 18 MCG inhalation capsule Place 1 capsule (18 mcg total) into inhaler and inhale daily.   tiZANidine (ZANAFLEX) 4 MG tablet Take 0.5-1 tablets (2-4 mg total) by mouth 3 (three) times daily as needed for muscle spasms (R jaw pain - sedation precautions).   traMADol (ULTRAM) 50 MG tablet Take 1 tablet (50 mg total) by mouth every 12 (twelve) hours as needed for moderate pain.   triamterene-hydrochlorothiazide (MAXZIDE-25) 37.5-25 MG tablet Take 1 tablet by mouth daily.   zolpidem (AMBIEN) 10 MG tablet TAKE 1 TABLET(10 MG) BY MOUTH AT BEDTIME AS NEEDED FOR SLEEP   Facility-Administered Encounter Medications as of 04/07/2021  Medication   methylPREDNISolone acetate (DEPO-MEDROL) injection 80 mg   Contacted patient to discuss COPD disease state  Current COPD regimen: Albuterol Sulfate 2.'5mg'$  - Patient states he very rarely uses it  Spiriva with HandiHaler 72mg and inhalation capsules - Patient states he knows he should be using it every day, but he doesn't feel he needs it that often so he  doesn't use it.    Any recent hospitalizations or ED visits since last visit with CPP? Yes  denies COPD symptoms, including Increased shortness of breath , Rescue medicine is not helping, Shortness of breath at rest, Symptoms worse with exercise, Symptoms worse at night, and Wheezing   What recent interventions/DTPs have been made by any provider to improve breathing since last visit: Patient  states there aren't any. He is doing well in regards to his COPD and does not use his medication very often.    Have you had exacerbation/flare-up since last visit? No  What do you do when you are short of breath?  Rescue medication and Nebulizer with Albuterol  Current tobacco use?  None quit in 1980  Respiratory Devices/Equipment Do you have a nebulizer? Yes Do you use a Peak Flow Meter? No Do you use a maintenance inhaler? Yes How often do you forget to use your daily inhaler? Patient states he uses it 2-3 a week (Spiriva) Do you use a rescue inhaler? Yes How often do you use your rescue inhaler?  infrequently Patient can not remember last time he used it.  Do you use a spacer with your inhaler? Yes  Adherence Review: Does the patient have >5 day gap between last estimated fill date for maintenance inhaler medications? No  Patient Upcoming Appointment: 04/21/2021 - Urology  Care Gaps: Annual wellness visit in last year? Yes 10/16/2020 Most Recent BP reading: 137/76 on 03/20/2021  [facility] Medication Name  Last Fill Date  Day supply Atorvastatin '40mg'$   02/07/2021  [place], CPP notified  Marijean Niemann, [place] Pharmacy Assistant [phone]   I have reviewed the care management and care coordination activities outlined in this encounter and I am certifying that I agree with the content of this note. No further action required.  Debbora Dus, PharmD Clinical Pharmacist [facility] [phone]

## 2021-04-09 ENCOUNTER — Other Ambulatory Visit: Payer: Self-pay

## 2021-04-21 ENCOUNTER — Other Ambulatory Visit: Payer: PPO

## 2021-04-21 ENCOUNTER — Other Ambulatory Visit: Payer: Self-pay

## 2021-04-21 DIAGNOSIS — E291 Testicular hypofunction: Secondary | ICD-10-CM | POA: Diagnosis not present

## 2021-04-22 ENCOUNTER — Encounter: Payer: Self-pay | Admitting: Urology

## 2021-04-22 DIAGNOSIS — G4733 Obstructive sleep apnea (adult) (pediatric): Secondary | ICD-10-CM | POA: Diagnosis not present

## 2021-04-22 LAB — TESTOSTERONE: Testosterone: 1450 ng/dL — ABNORMAL HIGH (ref 264–916)

## 2021-04-22 LAB — HEMATOCRIT: Hematocrit: 54.3 % — ABNORMAL HIGH (ref 37.5–51.0)

## 2021-04-23 ENCOUNTER — Telehealth: Payer: Self-pay | Admitting: *Deleted

## 2021-04-23 ENCOUNTER — Ambulatory Visit: Payer: PPO | Admitting: Family Medicine

## 2021-04-23 ENCOUNTER — Encounter: Payer: Self-pay | Admitting: *Deleted

## 2021-04-23 ENCOUNTER — Telehealth: Payer: Self-pay | Admitting: Urology

## 2021-04-23 ENCOUNTER — Encounter: Payer: Self-pay | Admitting: Family Medicine

## 2021-04-23 ENCOUNTER — Other Ambulatory Visit: Payer: Self-pay

## 2021-04-23 ENCOUNTER — Ambulatory Visit (INDEPENDENT_AMBULATORY_CARE_PROVIDER_SITE_OTHER): Payer: PPO | Admitting: Family Medicine

## 2021-04-23 VITALS — BP 120/80 | HR 77 | Temp 98.8°F | Ht 69.25 in | Wt 244.5 lb

## 2021-04-23 DIAGNOSIS — M7062 Trochanteric bursitis, left hip: Secondary | ICD-10-CM | POA: Diagnosis not present

## 2021-04-23 MED ORDER — TRIAMCINOLONE ACETONIDE 40 MG/ML IJ SUSP
40.0000 mg | Freq: Once | INTRAMUSCULAR | Status: AC
  Administered 2021-04-23: 40 mg via INTRA_ARTICULAR

## 2021-04-23 NOTE — Progress Notes (Signed)
Aaron Mitchell. Aaron Kinnamon, MD, [place] at [facility] [place], [address]  Phone: [phone]  FAX: [phone]  Aaron Mitchell - 75 y.o. male  MRN [id number]  Date of Birth: 1946-06-03  Date: 04/23/2021  PCP: [name], MD  Referral: [name], MD  Chief Complaint  Patient presents with   Hip Pain    Left injection    This visit occurred during the SARS-CoV-2 public health emergency.  Safety protocols were in place, including screening questions prior to the visit, additional usage of staff PPE, and extensive cleaning of exam room while observing appropriate contact time as indicated for disinfecting solutions.    Procedure only  Aspiration/Injection Procedure Note Aaron Mitchell [birth year] Date of procedure: 04/23/2021  Procedure: Large Joint Aspiration / Injection of Hip, Trochanteric Bursa, L Indications: Pain  Procedure Details Verbal consent obtained. Risks, benefits, and alternatives reviewed. Greater trochanter sterilely prepped with Chloraprep. Ethyl Chloride used for anesthesia. 9 cc of Lidocaine 1% injected with 1 mL of Kenalog 40 mg into trochanteric bursa at area of maximal tenderness at greater trochanter. Needle taken to bone to troch bursa, flows easily. Bursa massaged. No bleeding and no complications. Decreased pain after injection. Needle: 22 gauge spinal needle Medication: 1 mL of Kenalog 40 mg     ICD-10-CM   1. Trochanteric bursitis of left hip  M70.62 triamcinolone acetonide (KENALOG-40) injection 40 mg      Meds ordered this encounter  Medications   triamcinolone acetonide (KENALOG-40) injection 40 mg   There are no discontinued medications. No orders of the defined types were placed in this encounter.  Signed,  Maud Deed. Shadrach Bartunek, MD   Outpatient Encounter Medications as of 04/23/2021  Medication Sig   acetaminophen (TYLENOL 8 HOUR ARTHRITIS PAIN) 650 MG CR tablet  Take 2 tablets (1,300 mg total) by mouth 2 (two) times daily as needed for pain.   albuterol (PROVENTIL) (2.5 MG/3ML) 0.083% nebulizer solution Take 3 mLs (2.5 mg total) by nebulization every 6 (six) hours as needed for wheezing or shortness of breath.   albuterol (VENTOLIN HFA) 108 (90 Base) MCG/ACT inhaler Inhale 2 puffs into the lungs every 6 (six) hours as needed for wheezing or shortness of breath.   allopurinol (ZYLOPRIM) 300 MG tablet Take 1 tablet (300 mg total) by mouth daily.   amLODipine (NORVASC) 5 MG tablet Take 1 tablet (5 mg total) by mouth daily.   apixaban (ELIQUIS) 5 MG TABS tablet Take 5 mg by mouth 2 (two) times daily.   atorvastatin (LIPITOR) 40 MG tablet Take 1 tablet (40 mg total) by mouth daily.   diclofenac sodium (VOLTAREN) 1 % GEL Apply 2 g topically daily as needed. Apply to both hands.   fluticasone (FLONASE) 50 MCG/ACT nasal spray SHAKE LIQUID AND USE 2 SPRAYS IN EACH NOSTRIL DAILY   furosemide (LASIX) 40 MG tablet TAKE ONE TABLET BY MOUTH ONCE DAILY as needed FOR EDEMA   metaxalone (SKELAXIN) 800 MG tablet Take 1 tablet (800 mg total) by mouth 3 (three) times daily as needed for muscle spasms.   Multiple Vitamins tablet Take 1 tablet by mouth daily.   sildenafil (VIAGRA) 100 MG tablet Take by mouth.   tadalafil (CIALIS) 20 MG tablet Take 20 mg by mouth daily as needed for erectile dysfunction.   testosterone cypionate (DEPOTESTOSTERONE CYPIONATE) 200 MG/ML injection Inject 0.5 mLs (100 mg total) into the muscle once a week.   tiotropium (SPIRIVA HANDIHALER) 18 MCG inhalation  capsule Place 1 capsule (18 mcg total) into inhaler and inhale daily.   tiZANidine (ZANAFLEX) 4 MG tablet Take 0.5-1 tablets (2-4 mg total) by mouth 3 (three) times daily as needed for muscle spasms (R jaw pain - sedation precautions).   traMADol (ULTRAM) 50 MG tablet Take 1 tablet (50 mg total) by mouth every 12 (twelve) hours as needed for moderate pain.   triamterene-hydrochlorothiazide  (MAXZIDE-25) 37.5-25 MG tablet Take 1 tablet by mouth daily.   zolpidem (AMBIEN) 10 MG tablet TAKE 1 TABLET(10 MG) BY MOUTH AT BEDTIME AS NEEDED FOR SLEEP   Facility-Administered Encounter Medications as of 04/23/2021  Medication   methylPREDNISolone acetate (DEPO-MEDROL) injection 80 mg   [COMPLETED] triamcinolone acetonide (KENALOG-40) injection 40 mg

## 2021-04-23 NOTE — Telephone Encounter (Signed)
Send my chart message and talked with patient on phone about Recommend decreasing testosterone to 0.25 cc weekly.  Repeat testosterone level and hematocrit 6 weeks.  Would recommend he donate blood prior to next blood draw. Per Dr . Bernardo Heater. Notified patient as instructed, patient pleased. Discussed follow-up appointments, patient agrees

## 2021-04-23 NOTE — Telephone Encounter (Signed)
Hematocrit elevated 54.3.  Testosterone level was elevated at 1450.  When was his last injection in relation to the blood draw?

## 2021-04-29 DIAGNOSIS — G4733 Obstructive sleep apnea (adult) (pediatric): Secondary | ICD-10-CM | POA: Diagnosis not present

## 2021-05-05 ENCOUNTER — Telehealth: Payer: Self-pay

## 2021-05-05 NOTE — Chronic Care Management (AMB) (Addendum)
Chronic Care Management Pharmacy Assistant   Name: Aaron Mitchell  MRN: [id number] DOB: 04/30/1946  Reason for Encounter: Medication Adherence and Delivery Coordination  Recent office visits:  04/23/21-Family Medicine-Patient presented for left hip pain- Triamcinolone 40mg  injection.  Recent consult visits:  None since last CCM visit  Hospital visits:  None in previous 6 months  Medications: Outpatient Encounter Medications as of 05/05/2021  Medication Sig   acetaminophen (TYLENOL 8 HOUR ARTHRITIS PAIN) 650 MG CR tablet Take 2 tablets (1,300 mg total) by mouth 2 (two) times daily as needed for pain.   albuterol (PROVENTIL) (2.5 MG/3ML) 0.083% nebulizer solution Take 3 mLs (2.5 mg total) by nebulization every 6 (six) hours as needed for wheezing or shortness of breath.   albuterol (VENTOLIN HFA) 108 (90 Base) MCG/ACT inhaler Inhale 2 puffs into the lungs every 6 (six) hours as needed for wheezing or shortness of breath.   allopurinol (ZYLOPRIM) 300 MG tablet Take 1 tablet (300 mg total) by mouth daily.   amLODipine (NORVASC) 5 MG tablet Take 1 tablet (5 mg total) by mouth daily.   apixaban (ELIQUIS) 5 MG TABS tablet Take 5 mg by mouth 2 (two) times daily.   atorvastatin (LIPITOR) 40 MG tablet Take 1 tablet (40 mg total) by mouth daily.   diclofenac sodium (VOLTAREN) 1 % GEL Apply 2 g topically daily as needed. Apply to both hands.   fluticasone (FLONASE) 50 MCG/ACT nasal spray SHAKE LIQUID AND USE 2 SPRAYS IN EACH NOSTRIL DAILY   furosemide (LASIX) 40 MG tablet TAKE ONE TABLET BY MOUTH ONCE DAILY as needed FOR EDEMA   metaxalone (SKELAXIN) 800 MG tablet Take 1 tablet (800 mg total) by mouth 3 (three) times daily as needed for muscle spasms.   Multiple Vitamins tablet Take 1 tablet by mouth daily.   sildenafil (VIAGRA) 100 MG tablet Take by mouth.   tadalafil (CIALIS) 20 MG tablet Take 20 mg by mouth daily as needed for erectile dysfunction.   testosterone cypionate  (DEPOTESTOSTERONE CYPIONATE) 200 MG/ML injection Inject 0.5 mLs (100 mg total) into the muscle once a week.   tiotropium (SPIRIVA HANDIHALER) 18 MCG inhalation capsule Place 1 capsule (18 mcg total) into inhaler and inhale daily.   tiZANidine (ZANAFLEX) 4 MG tablet Take 0.5-1 tablets (2-4 mg total) by mouth 3 (three) times daily as needed for muscle spasms (R jaw pain - sedation precautions).   traMADol (ULTRAM) 50 MG tablet Take 1 tablet (50 mg total) by mouth every 12 (twelve) hours as needed for moderate pain.   triamterene-hydrochlorothiazide (MAXZIDE-25) 37.5-25 MG tablet Take 1 tablet by mouth daily.   zolpidem (AMBIEN) 10 MG tablet TAKE 1 TABLET(10 MG) BY MOUTH AT BEDTIME AS NEEDED FOR SLEEP   Facility-Administered Encounter Medications as of 05/05/2021  Medication   methylPREDNISolone acetate (DEPO-MEDROL) injection 80 mg   BP Readings from Last 3 Encounters:  04/23/21 120/80  03/20/21 137/76  02/19/21 134/82    Lab Results  Component Value Date   HGBA1C 6.5 10/10/2020      Recent OV, Consult or Hospital visit:  04/23/21 - Family Medicine - Patient presented for left hip pain.Triamcinolone 40mg  injection.  Last adherence delivery date: 03/17/21      Patient is due for next adherence delivery on: 05/14/21  Spoke with patient on 05/05/21 reviewed medications and coordinated delivery.  This delivery to include: Adherence Packaging  90 Days  Packs: Allopurinol 300 mg 1 tablet before breakfast  Amlodipine 5 mg 1 tablet before breakfast  Atorvastatin 40 mg 1 tablet before breakfast  Triamterene 37.5 mg-HCTZ 25 mg 1 tablet  before breakfast  VIAL medications: Fluticasone 76mcg/act 2 sprays each nostril daily Testosterone 200mg /ml  Patient declined the following medications this month: Furosemide 40 mg - patient takes as needed and filled 04/03/21 90 DS Eliquis 5mg  take 2 times daily patient did not need this time, despite filled 01/09/21 90 DS - this medication is DUE for  refill Albuterol sulfate 2.5mg /15ml patient uses as needed, last filled 01/06/21 30 DS  Any concerns about your medications? No  Is patient in packaging Yes  What is the date on your next pill pack? The patient is not near the medication to read the date   Any concerns or issues with your packaging? None identified  Refills requested from providers include: Testosterone 200mg /ml .  Confirmed delivery date of 05/14/21, advised patient that pharmacy will contact them the morning of delivery.  No BP readings available  Annual wellness visit in last year? 10/16/20 Most Recent BP reading: 120/80  77-P  04/23/21  Debbora Dus, CPP notified  Avel Sensor, [place] Assistant [phone]  I have reviewed the care management and care coordination activities outlined in this encounter and I am certifying that I agree with the content of this note. Will follow up with patient on Eliquis adherence in 1 week.  Debbora Dus, PharmD Clinical Pharmacist [facility] at [facility] [phone]

## 2021-05-09 ENCOUNTER — Other Ambulatory Visit: Payer: Self-pay | Admitting: Family Medicine

## 2021-05-16 DIAGNOSIS — M5416 Radiculopathy, lumbar region: Secondary | ICD-10-CM | POA: Diagnosis not present

## 2021-05-16 DIAGNOSIS — M5126 Other intervertebral disc displacement, lumbar region: Secondary | ICD-10-CM | POA: Diagnosis not present

## 2021-05-22 DIAGNOSIS — G4733 Obstructive sleep apnea (adult) (pediatric): Secondary | ICD-10-CM | POA: Diagnosis not present

## 2021-06-05 ENCOUNTER — Other Ambulatory Visit: Payer: Self-pay

## 2021-06-05 DIAGNOSIS — E291 Testicular hypofunction: Secondary | ICD-10-CM

## 2021-06-05 DIAGNOSIS — R7989 Other specified abnormal findings of blood chemistry: Secondary | ICD-10-CM

## 2021-06-06 ENCOUNTER — Other Ambulatory Visit: Payer: PPO

## 2021-06-06 ENCOUNTER — Other Ambulatory Visit: Payer: Self-pay

## 2021-06-06 DIAGNOSIS — R7989 Other specified abnormal findings of blood chemistry: Secondary | ICD-10-CM | POA: Diagnosis not present

## 2021-06-06 DIAGNOSIS — E291 Testicular hypofunction: Secondary | ICD-10-CM

## 2021-06-07 LAB — TESTOSTERONE: Testosterone: 526 ng/dL (ref 264–916)

## 2021-06-07 LAB — HEMATOCRIT: Hematocrit: 48.6 % (ref 37.5–51.0)

## 2021-06-09 ENCOUNTER — Encounter: Payer: Self-pay | Admitting: *Deleted

## 2021-06-09 ENCOUNTER — Encounter: Payer: Self-pay | Admitting: Family Medicine

## 2021-06-13 ENCOUNTER — Telehealth: Payer: Self-pay | Admitting: Family Medicine

## 2021-06-13 NOTE — Telephone Encounter (Signed)
Lvm asking Aaron Mitchell to call back.  Need to know exactly what info she needs and what number to fax to.

## 2021-06-13 NOTE — Telephone Encounter (Signed)
Tammy with [facility] called asking if Dr Darnell Level has got  the request form for office visit notes for pt.

## 2021-06-16 NOTE — Telephone Encounter (Signed)
Lvm asking Tammy to call back.  Need to know exactly what info she needs and what number to fax to.

## 2021-06-18 ENCOUNTER — Encounter: Payer: Self-pay | Admitting: *Deleted

## 2021-06-18 ENCOUNTER — Encounter: Payer: Self-pay | Admitting: Urology

## 2021-06-18 ENCOUNTER — Other Ambulatory Visit: Payer: Self-pay | Admitting: Urology

## 2021-06-18 MED ORDER — TESTOSTERONE CYPIONATE 200 MG/ML IM SOLN: 70.0000 mg | INTRAMUSCULAR | 0 refills | Status: DC

## 2021-06-18 NOTE — Telephone Encounter (Signed)
Lvm asking Aaron Mitchell to call back.  Need to know exactly what info she needs and what number to fax to.

## 2021-06-23 ENCOUNTER — Telehealth: Payer: Self-pay

## 2021-06-23 DIAGNOSIS — G4733 Obstructive sleep apnea (adult) (pediatric): Secondary | ICD-10-CM | POA: Diagnosis not present

## 2021-06-23 NOTE — Telephone Encounter (Signed)
Pt LM on triage line stating the he had a question about his medication refill.

## 2021-06-23 NOTE — Telephone Encounter (Signed)
Spoke with Ciera of [employer] trying to return Tammy's call.  States Tammy was needing latest F-2-F notes where CPAP was discussed.  Says to fax the notes to her [Ciera] attn at [phone] and she will fwd notes to [employer] via email.   Faxed OV notes.

## 2021-07-11 ENCOUNTER — Other Ambulatory Visit: Payer: Self-pay

## 2021-07-11 ENCOUNTER — Other Ambulatory Visit: Payer: PPO

## 2021-07-11 DIAGNOSIS — E291 Testicular hypofunction: Secondary | ICD-10-CM

## 2021-07-12 ENCOUNTER — Encounter: Payer: Self-pay | Admitting: Urology

## 2021-07-12 LAB — TESTOSTERONE: Testosterone: 565 ng/dL (ref 264–916)

## 2021-07-23 DIAGNOSIS — G4733 Obstructive sleep apnea (adult) (pediatric): Secondary | ICD-10-CM | POA: Diagnosis not present

## 2021-07-27 ENCOUNTER — Encounter: Payer: Self-pay | Admitting: Family Medicine

## 2021-07-30 NOTE — Telephone Encounter (Signed)
Spoke with patient.

## 2021-08-01 ENCOUNTER — Telehealth: Payer: Self-pay

## 2021-08-01 ENCOUNTER — Encounter: Payer: Self-pay | Admitting: Family Medicine

## 2021-08-01 NOTE — Chronic Care Management (AMB) (Addendum)
Chronic Care Management Pharmacy Assistant   Name: Aaron Mitchell  MRN: [id number] DOB: [birth year]  Reason for Encounter: Medication Adherence and Delivery Coordination   Recent office visits:  None since last CCM contact  Recent consult visits:  05/16/21-PM&R- Aaron Kitchen Chasnis,MD- Lumbar spine cortisone injection.  Hospital visits:  None in previous 6 months  Medications: Outpatient Encounter Medications as of 08/01/2021  Medication Sig   acetaminophen (TYLENOL 8 HOUR ARTHRITIS PAIN) 650 MG CR tablet Take 2 tablets (1,300 mg total) by mouth 2 (two) times daily as needed for pain.   albuterol (PROVENTIL) (2.5 MG/3ML) 0.083% nebulizer solution Take 3 mLs (2.5 mg total) by nebulization every 6 (six) hours as needed for wheezing or shortness of breath.   albuterol (VENTOLIN HFA) 108 (90 Base) MCG/ACT inhaler Inhale 2 puffs into the lungs every 6 (six) hours as needed for wheezing or shortness of breath.   allopurinol (ZYLOPRIM) 300 MG tablet Take 1 tablet (300 mg total) by mouth daily.   amLODipine (NORVASC) 5 MG tablet Take 1 tablet (5 mg total) by mouth daily.   apixaban (ELIQUIS) 5 MG TABS tablet Take 5 mg by mouth 2 (two) times daily.   atorvastatin (LIPITOR) 40 MG tablet Take 1 tablet (40 mg total) by mouth daily.   diclofenac sodium (VOLTAREN) 1 % GEL Apply 2 g topically daily as needed. Apply to both hands.   fluticasone (FLONASE) 50 MCG/ACT nasal spray SHAKE LIQUID AND USE 2 SPRAYS IN EACH NOSTRIL DAILY   furosemide (LASIX) 40 MG tablet TAKE ONE TABLET BY MOUTH ONCE DAILY as needed FOR EDEMA   metaxalone (SKELAXIN) 800 MG tablet Take 1 tablet (800 mg total) by mouth 3 (three) times daily as needed for muscle spasms.   Multiple Vitamins tablet Take 1 tablet by mouth daily.   sildenafil (VIAGRA) 100 MG tablet Take by mouth.   tadalafil (CIALIS) 20 MG tablet Take 20 mg by mouth daily as needed for erectile dysfunction.   testosterone cypionate (DEPOTESTOSTERONE CYPIONATE) 200  MG/ML injection Inject 0.35 mLs (70 mg total) into the muscle once a week.   tiotropium (SPIRIVA HANDIHALER) 18 MCG inhalation capsule Place 1 capsule (18 mcg total) into inhaler and inhale daily.   tiZANidine (ZANAFLEX) 4 MG tablet Take 0.5-1 tablets (2-4 mg total) by mouth 3 (three) times daily as needed for muscle spasms (R jaw pain - sedation precautions).   traMADol (ULTRAM) 50 MG tablet Take 1 tablet (50 mg total) by mouth every 12 (twelve) hours as needed for moderate pain.   triamterene-hydrochlorothiazide (MAXZIDE-25) 37.5-25 MG tablet Take 1 tablet by mouth daily.   zolpidem (AMBIEN) 10 MG tablet TAKE 1 TABLET(10 MG) BY MOUTH AT BEDTIME AS NEEDED FOR SLEEP   Facility-Administered Encounter Medications as of 08/01/2021  Medication   methylPREDNISolone acetate (DEPO-MEDROL) injection 80 mg   BP Readings from Last 3 Encounters:  04/23/21 120/80  03/20/21 137/76  02/19/21 134/82    Lab Results  Component Value Date   HGBA1C 6.5 10/10/2020      Recent OV, Consult or Hospital visit:  05/16/21- Lumbar spine injection procedure No medication changes indicated   Last adherence delivery date:05/14/21      Patient is due for next adherence delivery on: 08/11/21  Spoke with patient on 08/04/21 reviewed medications and coordinated delivery.  This delivery to include: Adherence Packaging  90 Days  Packs: Allopurinol 300 mg 1 tablet before breakfast  Amlodipine 5 mg 1 tablet before breakfast  Atorvastatin 40 mg 1 tablet before  breakfast  Triamterene 37.5 mg-HCTZ 25 mg 1 tablet  before breakfast  Eliquis  5mg   take 1 tablet breakfast 1 tablet evening meal   VIAL medications: Fluticasone 37mcg/act 2 sprays each nostril daily as needed  Patient declined the following medications this month: Furosemide 40 mg - patient takes as needed and filled 07/02/21 90 DS Testosterone 200mg /ml - filled 07/02/21 90DS  Any concerns about your medications? No  How often do you forget or accidentally  miss a dose? Never  Is patient in packaging Yes  Any concerns or issues with your packaging? Patient reports no issues with the packs   No refill request needed.  Confirmed delivery date of 08/11/21, advised patient that pharmacy will contact them the morning of delivery.  No BP readings available   Annual wellness visit in last year? Yes Most Recent BP reading:120/80  77-P  04/23/21  Debbora Dus, CPP notified  Avel Sensor, [place] Assistant [phone]  I have reviewed the care management and care coordination activities outlined in this encounter and I am certifying that I agree with the content of this note. No further action required.  Debbora Dus, PharmD Clinical Pharmacist [place] [facility] at [facility] [phone]

## 2021-08-04 NOTE — Telephone Encounter (Signed)
Entered in error

## 2021-08-07 ENCOUNTER — Ambulatory Visit (INDEPENDENT_AMBULATORY_CARE_PROVIDER_SITE_OTHER): Payer: PPO | Admitting: Family Medicine

## 2021-08-07 ENCOUNTER — Encounter: Payer: Self-pay | Admitting: Family Medicine

## 2021-08-07 ENCOUNTER — Other Ambulatory Visit: Payer: Self-pay

## 2021-08-07 VITALS — BP 140/80 | HR 45 | Temp 98.0°F | Ht 69.25 in | Wt 245.0 lb

## 2021-08-07 DIAGNOSIS — M7062 Trochanteric bursitis, left hip: Secondary | ICD-10-CM

## 2021-08-07 DIAGNOSIS — M7061 Trochanteric bursitis, right hip: Secondary | ICD-10-CM | POA: Diagnosis not present

## 2021-08-07 MED ORDER — TRIAMCINOLONE ACETONIDE 40 MG/ML IJ SUSP
40.0000 mg | Freq: Once | INTRAMUSCULAR | Status: AC
  Administered 2021-08-07: 40 mg via INTRA_ARTICULAR

## 2021-08-07 NOTE — Progress Notes (Signed)
° ° °  [name], MD, [place] at [facility] [place], [address]  Phone: [phone]   FAX: [phone]  Aaron Mitchell - 76 y.o. male   MRN [id number]   Date of Birth: 02/15/1946  Date: 08/07/2021   PCP: [name], MD   Referral: [name], MD  Chief Complaint  Patient presents with   Hip Pain    Wants Bilateral-Injection    This visit occurred during the SARS-CoV-2 public health emergency.  Safety protocols were in place, including screening questions prior to the visit, additional usage of staff PPE, and extensive cleaning of exam room while observing appropriate contact time as indicated for disinfecting solutions.    Lab Results  Component Value Date   HGBA1C 6.5 10/10/2020    Aspiration/Injection Procedure Note [name] 1946/06/07 Date of procedure: 08/07/2021  Procedure: Large Joint Aspiration / Injection of Hip, Trochanteric Bursa, R Indications: Pain  Procedure Details Verbal consent obtained. Risks, benefits, and alternatives reviewed. Greater trochanter sterilely prepped with Chloraprep. Ethyl Chloride used for anesthesia. 9 cc of Lidocaine 1% injected with 1 mL of Kenalog 40 mg into trochanteric bursa at area of maximal tenderness at greater trochanter. Needle taken to bone to troch bursa, flows easily. Bursa massaged. No bleeding and no complications. Decreased pain after injection. Needle: 22 gauge spinal needle Medication: 1 mL of Kenalog 40 mg   Aspiration/Injection Procedure Note [name] 1946/05/15 Date of procedure: 08/07/2021  Procedure: Large Joint Aspiration / Injection of Hip, Trochanteric Bursa, L Indications: Pain  Procedure Details Verbal consent obtained. Risks, benefits, and alternatives reviewed. Greater trochanter sterilely prepped with Chloraprep. Ethyl Chloride used for anesthesia. 9 cc of Lidocaine 1% injected with 1 mL of Kenalog 40 mg into  trochanteric bursa at area of maximal tenderness at greater trochanter. Needle taken to bone to troch bursa, flows easily. Bursa massaged. No bleeding and no complications. Decreased pain after injection. Needle: 22 gauge spinal needle Medication: 1 mL of Kenalog 40 mg     ICD-10-CM   1. Trochanteric bursitis of left hip  M70.62 triamcinolone acetonide (KENALOG-40) injection 40 mg    2. Trochanteric bursitis of right hip  M70.61 triamcinolone acetonide (KENALOG-40) injection 40 mg      Meds ordered this encounter  Medications   triamcinolone acetonide (KENALOG-40) injection 40 mg   triamcinolone acetonide (KENALOG-40) injection 40 mg    Signed,  Rehmat Murtagh T. Gayle Collard, MD

## 2021-08-11 ENCOUNTER — Ambulatory Visit: Payer: PPO | Admitting: Family Medicine

## 2021-08-13 ENCOUNTER — Other Ambulatory Visit: Payer: Self-pay

## 2021-08-13 ENCOUNTER — Ambulatory Visit: Payer: PPO | Admitting: Family Medicine

## 2021-08-13 ENCOUNTER — Ambulatory Visit (INDEPENDENT_AMBULATORY_CARE_PROVIDER_SITE_OTHER): Payer: PPO | Admitting: Family Medicine

## 2021-08-13 ENCOUNTER — Encounter: Payer: Self-pay | Admitting: Family Medicine

## 2021-08-13 ENCOUNTER — Ambulatory Visit (INDEPENDENT_AMBULATORY_CARE_PROVIDER_SITE_OTHER)
Admission: RE | Admit: 2021-08-13 | Discharge: 2021-08-13 | Disposition: A | Discharge status: Home / Self Care | Payer: PPO | Source: Ambulatory Visit | Attending: Family Medicine | Admitting: Family Medicine

## 2021-08-13 VITALS — BP 134/76 | HR 44 | Temp 97.7°F | Ht 69.25 in | Wt 245.4 lb

## 2021-08-13 DIAGNOSIS — R7303 Prediabetes: Secondary | ICD-10-CM

## 2021-08-13 DIAGNOSIS — E1169 Type 2 diabetes mellitus with other specified complication: Secondary | ICD-10-CM | POA: Diagnosis not present

## 2021-08-13 DIAGNOSIS — L989 Disorder of the skin and subcutaneous tissue, unspecified: Secondary | ICD-10-CM

## 2021-08-13 DIAGNOSIS — M1812 Unilateral primary osteoarthritis of first carpometacarpal joint, left hand: Secondary | ICD-10-CM | POA: Diagnosis not present

## 2021-08-13 LAB — POCT GLYCOSYLATED HEMOGLOBIN (HGB A1C): Hemoglobin A1C: 6.7 % — AB (ref 4.0–5.6)

## 2021-08-13 NOTE — Progress Notes (Signed)
Patient ID: Aaron Mitchell, male    DOB: 10-08-1945, 76 y.o.   MRN: [id number]  This visit was conducted in person.  BP 134/76    Pulse (!) 44    Temp 97.7 F (36.5 C) (Temporal)    Ht 5' 9.25" (1.759 m)    Wt 245 lb 7 oz (111.3 kg)    SpO2 97%    BMI 35.98 kg/m    CC: check spot on hand  Subjective:   HPI: Aaron Mitchell is a 76 y.o. male presenting on 08/13/2021 for Cyst (C/o cyst on L hand. Noticed about 10 yrs ago but starting to bother pt.  Thinks it may be a metal splinter. )   Longstanding h/o tender spot on L hand (10 years) - thinks it may be metal splinter that is turning into small cyst. Recently over the past few months becoming more sensitive to pressure, tender. Wants this removed.  No fevers/chills, no new joint pains.   Received bilateral hip bursitis shots earlier this month by Dr Lorelei Pont.   Continues working on weight loss - has decreased portion sizes to 50% of previous amount. Has lost 50 lbs with this. 20 lbs down since 01/2021! Requests A1c updated today.      Relevant past medical, surgical, family and social history reviewed and updated as indicated. Interim medical history since our last visit reviewed. Allergies and medications reviewed and updated. Outpatient Medications Prior to Visit  Medication Sig Dispense Refill   acetaminophen (TYLENOL 8 HOUR ARTHRITIS PAIN) 650 MG CR tablet Take 2 tablets (1,300 mg total) by mouth 2 (two) times daily as needed for pain.     albuterol (PROVENTIL) (2.5 MG/3ML) 0.083% nebulizer solution Take 3 mLs (2.5 mg total) by nebulization every 6 (six) hours as needed for wheezing or shortness of breath. 90 mL 3   albuterol (VENTOLIN HFA) 108 (90 Base) MCG/ACT inhaler Inhale 2 puffs into the lungs every 6 (six) hours as needed for wheezing or shortness of breath. 3 Inhaler 3   allopurinol (ZYLOPRIM) 300 MG tablet Take 1 tablet (300 mg total) by mouth daily. 90 tablet 3   amLODipine (NORVASC) 5 MG tablet Take 1 tablet (5 mg total)  by mouth daily. 90 tablet 3   apixaban (ELIQUIS) 5 MG TABS tablet Take 5 mg by mouth 2 (two) times daily.     atorvastatin (LIPITOR) 40 MG tablet Take 1 tablet (40 mg total) by mouth daily. 90 tablet 3   diclofenac sodium (VOLTAREN) 1 % GEL Apply 2 g topically daily as needed. Apply to both hands.     fluticasone (FLONASE) 50 MCG/ACT nasal spray SHAKE LIQUID AND USE 2 SPRAYS IN EACH NOSTRIL DAILY 48 g 2   furosemide (LASIX) 40 MG tablet TAKE ONE TABLET BY MOUTH ONCE DAILY as needed FOR EDEMA 30 tablet 5   metaxalone (SKELAXIN) 800 MG tablet Take 1 tablet (800 mg total) by mouth 3 (three) times daily as needed for muscle spasms. 90 tablet 0   Multiple Vitamins tablet Take 1 tablet by mouth daily.     sildenafil (VIAGRA) 100 MG tablet Take by mouth.     tadalafil (CIALIS) 20 MG tablet Take 20 mg by mouth daily as needed for erectile dysfunction.     testosterone cypionate (DEPOTESTOSTERONE CYPIONATE) 200 MG/ML injection Inject 0.35 mLs (70 mg total) into the muscle once a week. 10 mL 0   tiotropium (SPIRIVA HANDIHALER) 18 MCG inhalation capsule Place 1 capsule (18 mcg total) into inhaler  and inhale daily. 90 capsule 3   tiZANidine (ZANAFLEX) 4 MG tablet Take 0.5-1 tablets (2-4 mg total) by mouth 3 (three) times daily as needed for muscle spasms (R jaw pain - sedation precautions). 40 tablet 0   traMADol (ULTRAM) 50 MG tablet Take 1 tablet (50 mg total) by mouth every 12 (twelve) hours as needed for moderate pain. 30 tablet 0   triamterene-hydrochlorothiazide (MAXZIDE-25) 37.5-25 MG tablet Take 1 tablet by mouth daily. 90 tablet 3   zolpidem (AMBIEN) 10 MG tablet TAKE 1 TABLET(10 MG) BY MOUTH AT BEDTIME AS NEEDED FOR SLEEP 30 tablet 0   Facility-Administered Medications Prior to Visit  Medication Dose Route Frequency Provider Last Rate Last Admin   methylPREDNISolone acetate (DEPO-MEDROL) injection 80 mg  80 mg Intra-articular Once [name], Spencer, MD         Per HPI unless specifically indicated  in ROS section below Review of Systems  Objective:  BP 134/76    Pulse (!) 44    Temp 97.7 F (36.5 C) (Temporal)    Ht 5' 9.25" (1.759 m)    Wt 245 lb 7 oz (111.3 kg)    SpO2 97%    BMI 35.98 kg/m   Wt Readings from Last 3 Encounters:  08/13/21 245 lb 7 oz (111.3 kg)  08/07/21 245 lb (111.1 kg)  04/23/21 244 lb 8 oz (110.9 kg)      Physical Exam Vitals and nursing note reviewed.  Constitutional:      Appearance: Normal appearance. He is not ill-appearing.  Skin:    General: Skin is warm and dry.     Findings: Erythema and lesion present. No rash.     Comments: Small tender erythematous macule to left medial palm without obvious foreign body   Neurological:     Mental Status: He is alert.      Results for orders placed or performed in visit on 08/13/21  POCT glycosylated hemoglobin (Hb A1C)  Result Value Ref Range   Hemoglobin A1C 6.7 (A) 4.0 - 5.6 %   HbA1c POC (<> result, manual entry)     HbA1c, POC (prediabetic range)     HbA1c, POC (controlled diabetic range)     Foreign body removal: IC obtained and in chart. L medial palm cleaned with alcohol had, then numbed with ethyl chloride. Using 25g needle, superficial incision made mid skin lesion and expressed without any obvious foreign body present. Bandaid placed on skin, pt tolerated well. Home care instructions provided.   Assessment & Plan:  This visit occurred during the SARS-CoV-2 public health emergency.  Safety protocols were in place, including screening questions prior to the visit, additional usage of staff PPE, and extensive cleaning of exam room while observing appropriate contact time as indicated for disinfecting solutions.   Problem List Items Addressed This Visit     Type 2 diabetes mellitus with other specified complication ([place])    D6L staying in controlled diabetes range despite decreased weight noted. Encouraged ongoing efforts to follow up low sugar low carb diabetic diet.       Relevant Orders    POCT glycosylated hemoglobin (Hb A1C) (Completed)   Skin lesion of hand - Primary    Check xray to evaluate for foreign body - normal.  Pt continues to desire attempt at foreign body removal which was attempted without obvious foreign body found. Home care instructions provided. Update if ongoing discomfort, consider voltaren gel once skin fully healed.       Relevant Orders  DG Hand Complete Left     No orders of the defined types were placed in this encounter.  Orders Placed This Encounter  Procedures   DG Hand Complete Left    Standing Status:   Future    Number of Occurrences:   1    Standing Expiration Date:   08/13/2022    Order Specific Question:   Reason for Exam (SYMPTOM  OR DIAGNOSIS REQUIRED)    Answer:   evaluate for foreign body L ulnar palm    Order Specific Question:   Preferred imaging location?    Answer:   [facility]   POCT glycosylated hemoglobin (Hb A1C)     Patient Instructions  A1c today  Left hand xray today - normal.  Once area heals, may use voltaren gel to tender red spot.   Follow up plan: Return if symptoms worsen or fail to improve.  [name], MD

## 2021-08-13 NOTE — Assessment & Plan Note (Addendum)
Check xray to evaluate for foreign body - normal.  Pt continues to desire attempt at foreign body removal which was attempted without obvious foreign body found. Home care instructions provided. Update if ongoing discomfort, consider voltaren gel once skin fully healed.

## 2021-08-13 NOTE — Assessment & Plan Note (Signed)
A1c staying in controlled diabetes range despite decreased weight noted. Encouraged ongoing efforts to follow up low sugar low carb diabetic diet.

## 2021-08-13 NOTE — Patient Instructions (Addendum)
A1c today  Left hand xray today - normal.  Once area heals, may use voltaren gel to tender red spot.

## 2021-08-18 DIAGNOSIS — I482 Chronic atrial fibrillation, unspecified: Secondary | ICD-10-CM | POA: Diagnosis not present

## 2021-08-18 DIAGNOSIS — Z01818 Encounter for other preprocedural examination: Secondary | ICD-10-CM | POA: Diagnosis not present

## 2021-08-18 DIAGNOSIS — I447 Left bundle-branch block, unspecified: Secondary | ICD-10-CM | POA: Diagnosis not present

## 2021-08-18 DIAGNOSIS — I480 Paroxysmal atrial fibrillation: Secondary | ICD-10-CM | POA: Diagnosis not present

## 2021-08-18 DIAGNOSIS — E782 Mixed hyperlipidemia: Secondary | ICD-10-CM | POA: Diagnosis not present

## 2021-08-18 DIAGNOSIS — I10 Essential (primary) hypertension: Secondary | ICD-10-CM | POA: Diagnosis not present

## 2021-08-23 DIAGNOSIS — G4733 Obstructive sleep apnea (adult) (pediatric): Secondary | ICD-10-CM | POA: Diagnosis not present

## 2021-09-04 ENCOUNTER — Observation Stay
Admission: RE | Admit: 2021-09-04 | Discharge: 2021-09-05 | Disposition: A | Discharge status: Home / Self Care | Payer: PPO | Attending: Cardiology | Admitting: Cardiology

## 2021-09-04 ENCOUNTER — Other Ambulatory Visit: Payer: Self-pay

## 2021-09-04 ENCOUNTER — Encounter: Payer: Self-pay | Admitting: Cardiology

## 2021-09-04 ENCOUNTER — Encounter
Admission: RE | Disposition: A | Discharge status: Home / Self Care | Payer: Self-pay | Source: Home / Self Care | Attending: Cardiology

## 2021-09-04 DIAGNOSIS — Z20822 Contact with and (suspected) exposure to covid-19: Secondary | ICD-10-CM | POA: Diagnosis not present

## 2021-09-04 DIAGNOSIS — R001 Bradycardia, unspecified: Secondary | ICD-10-CM | POA: Diagnosis not present

## 2021-09-04 DIAGNOSIS — I495 Sick sinus syndrome: Secondary | ICD-10-CM | POA: Diagnosis not present

## 2021-09-04 DIAGNOSIS — R079 Chest pain, unspecified: Secondary | ICD-10-CM

## 2021-09-04 DIAGNOSIS — Z006 Encounter for examination for normal comparison and control in clinical research program: Secondary | ICD-10-CM | POA: Insufficient documentation

## 2021-09-04 HISTORY — PX: PACEMAKER LEADLESS INSERTION: EP1219

## 2021-09-04 LAB — RESP PANEL BY RT-PCR (FLU A&B, COVID) ARPGX2
Influenza A by PCR: NEGATIVE
Influenza B by PCR: NEGATIVE
SARS Coronavirus 2 by RT PCR: NEGATIVE

## 2021-09-04 SURGERY — PACEMAKER LEADLESS INSERTION
Anesthesia: Moderate Sedation

## 2021-09-04 MED ORDER — FENTANYL CITRATE (PF) 100 MCG/2ML IJ SOLN
INTRAMUSCULAR | Status: AC
  Filled 2021-09-04: qty 2

## 2021-09-04 MED ORDER — MIDAZOLAM HCL 2 MG/2ML IJ SOLN
INTRAMUSCULAR | Status: AC
  Filled 2021-09-04: qty 2

## 2021-09-04 MED ORDER — VERAPAMIL HCL 2.5 MG/ML IV SOLN
INTRAVENOUS | Status: AC
  Filled 2021-09-04: qty 2

## 2021-09-04 MED ORDER — HEPARIN (PORCINE) IN NACL 1000-0.9 UT/500ML-% IV SOLN
INTRAVENOUS | Status: AC
  Filled 2021-09-04: qty 1000

## 2021-09-04 MED ORDER — METOPROLOL TARTRATE 5 MG/5ML IV SOLN
INTRAVENOUS | Status: AC
  Filled 2021-09-04: qty 5

## 2021-09-04 MED ORDER — SODIUM CHLORIDE 0.9 % IV SOLN: INTRAVENOUS | Status: DC

## 2021-09-04 MED ORDER — FENTANYL CITRATE (PF) 100 MCG/2ML IJ SOLN
INTRAMUSCULAR | Status: DC | PRN
  Administered 2021-09-04 (×2): 25 ug via INTRAVENOUS
  Administered 2021-09-04: 50 ug via INTRAVENOUS

## 2021-09-04 MED ORDER — METOPROLOL TARTRATE 5 MG/5ML IV SOLN
INTRAVENOUS | Status: DC | PRN
  Administered 2021-09-04: 5 mg via INTRAVENOUS

## 2021-09-04 MED ORDER — MIDAZOLAM HCL 2 MG/2ML IJ SOLN
INTRAMUSCULAR | Status: DC | PRN
  Administered 2021-09-04: 1 mg via INTRAVENOUS
  Administered 2021-09-04: .5 mg via INTRAVENOUS
  Administered 2021-09-04: 1 mg via INTRAVENOUS

## 2021-09-04 MED ORDER — TRAMADOL HCL 50 MG PO TABS: 50.0000 mg | ORAL_TABLET | Freq: Two times a day (BID) | ORAL | Status: DC | PRN

## 2021-09-04 MED ORDER — AMLODIPINE BESYLATE 5 MG PO TABS
5.0000 mg | ORAL_TABLET | Freq: Every day | ORAL | Status: DC
  Administered 2021-09-04 – 2021-09-05 (×2): 5 mg via ORAL
  Filled 2021-09-04 (×2): qty 1

## 2021-09-04 MED ORDER — LISINOPRIL 10 MG PO TABS
20.0000 mg | ORAL_TABLET | Freq: Every day | ORAL | Status: DC
  Administered 2021-09-04 – 2021-09-05 (×2): 20 mg via ORAL
  Filled 2021-09-04 (×2): qty 2

## 2021-09-04 MED ORDER — ALLOPURINOL 300 MG PO TABS
300.0000 mg | ORAL_TABLET | Freq: Every day | ORAL | Status: DC
  Administered 2021-09-04: 300 mg via ORAL
  Filled 2021-09-04 (×2): qty 1

## 2021-09-04 MED ORDER — FUROSEMIDE 40 MG PO TABS
40.0000 mg | ORAL_TABLET | Freq: Every day | ORAL | Status: DC
  Administered 2021-09-04 – 2021-09-05 (×2): 40 mg via ORAL
  Filled 2021-09-04 (×2): qty 1

## 2021-09-04 MED ORDER — ONDANSETRON HCL 4 MG/2ML IJ SOLN: 4.0000 mg | Freq: Four times a day (QID) | INTRAMUSCULAR | Status: DC | PRN

## 2021-09-04 MED ORDER — HEPARIN (PORCINE) IN NACL 1000-0.9 UT/500ML-% IV SOLN
INTRAVENOUS | Status: DC | PRN
  Administered 2021-09-04 (×2): 500 mL

## 2021-09-04 MED ORDER — HEPARIN SODIUM (PORCINE) 1000 UNIT/ML IJ SOLN
INTRAMUSCULAR | Status: DC | PRN
  Administered 2021-09-04: 6000 [IU] via INTRAVENOUS

## 2021-09-04 MED ORDER — HEPARIN SODIUM (PORCINE) 1000 UNIT/ML IJ SOLN
INTRAMUSCULAR | Status: AC
  Filled 2021-09-04: qty 10

## 2021-09-04 MED ORDER — ATORVASTATIN CALCIUM 20 MG PO TABS
40.0000 mg | ORAL_TABLET | Freq: Every day | ORAL | Status: DC
  Administered 2021-09-04 – 2021-09-05 (×2): 40 mg via ORAL
  Filled 2021-09-04 (×2): qty 2

## 2021-09-04 MED ORDER — IOHEXOL 300 MG/ML  SOLN
INTRAMUSCULAR | Status: DC | PRN
  Administered 2021-09-04: 9 mL

## 2021-09-04 MED ORDER — TIOTROPIUM BROMIDE MONOHYDRATE 18 MCG IN CAPS
18.0000 ug | ORAL_CAPSULE | Freq: Every day | RESPIRATORY_TRACT | Status: DC
  Administered 2021-09-05: 18 ug via RESPIRATORY_TRACT
  Filled 2021-09-04: qty 5

## 2021-09-04 MED ORDER — ZOLPIDEM TARTRATE 10 MG PO TABS: 5.0000 mg | ORAL_TABLET | Freq: Every evening | ORAL | Status: DC | PRN

## 2021-09-04 MED ORDER — FLUTICASONE PROPIONATE 50 MCG/ACT NA SUSP
2.0000 | Freq: Every day | NASAL | Status: DC
  Administered 2021-09-05: 2 via NASAL
  Filled 2021-09-04: qty 16

## 2021-09-04 MED ORDER — HYDROCHLOROTHIAZIDE 12.5 MG PO TABS
12.5000 mg | ORAL_TABLET | Freq: Every day | ORAL | Status: DC
  Administered 2021-09-04 – 2021-09-05 (×2): 12.5 mg via ORAL
  Filled 2021-09-04 (×2): qty 1

## 2021-09-04 MED ORDER — ALBUTEROL SULFATE (2.5 MG/3ML) 0.083% IN NEBU: 3.0000 mL | INHALATION_SOLUTION | Freq: Four times a day (QID) | RESPIRATORY_TRACT | Status: DC | PRN

## 2021-09-04 MED ORDER — ACETAMINOPHEN 325 MG PO TABS: 325.0000 mg | ORAL_TABLET | ORAL | Status: DC | PRN

## 2021-09-04 SURGICAL SUPPLY — 18 items
DILATOR VESSEL 38 20CM 12FR (INTRODUCER) ×1 IMPLANT
DILATOR VESSEL 38 20CM 14FR (INTRODUCER) ×1 IMPLANT
DILATOR VESSEL 38 20CM 18FR (INTRODUCER) ×1 IMPLANT
DILATOR VESSEL 38 20CM 8FR (INTRODUCER) ×1 IMPLANT
KIT SYRINGE INJ CVI SPIKEX1 (MISCELLANEOUS) ×1 IMPLANT
MICRA AV TRANSCATH PACING SYS (Pacemaker) ×2 IMPLANT
MICRA INTRODUCER SHEATH (SHEATH) ×2
NDL PERC 18GX7CM (NEEDLE) IMPLANT
NEEDLE PERC 18GX7CM (NEEDLE) ×2 IMPLANT
PACK CARDIAC CATH (CUSTOM PROCEDURE TRAY) ×2 IMPLANT
PAD ELECT DEFIB RADIOL ZOLL (MISCELLANEOUS) ×1 IMPLANT
PROTECTION STATION PRESSURIZED (MISCELLANEOUS) ×2
SHEATH INTRODUCER MICRA (SHEATH) IMPLANT
SHEATH PINNACLE 7F 10CM (SHEATH) ×1 IMPLANT
STATION PROTECTION PRESSURIZED (MISCELLANEOUS) IMPLANT
SUT SILK 0 FSL (SUTURE) ×1 IMPLANT
SYSTEM PACING TRNSCTH AV MICRA (Pacemaker) IMPLANT
WIRE AMPLATZ SS-J .035X180CM (WIRE) ×1 IMPLANT

## 2021-09-05 DIAGNOSIS — I495 Sick sinus syndrome: Secondary | ICD-10-CM | POA: Diagnosis not present

## 2021-09-05 NOTE — Discharge Instructions (Signed)
Resume Eliquis on 09/07/2021.  Patient may remove bandage and take a shower on 09/07/2021.

## 2021-09-05 NOTE — Discharge Summary (Signed)
Physician Discharge Summary  Patient ID: Steffen Hase MRN: [id number] DOB/AGE: March 13, 1946 76 y.o.  Admit date: 09/04/2021 Discharge date: 09/05/2021  Primary Discharge Diagnosis bradycardia Secondary Discharge Diagnosis sick sinus syndrome  Significant Diagnostic Studies: yes  Consults: None  Hospital Course: 76 year old gentleman with history of atrial fibrillation and bradycardia referred for permanent pacemaker implantation.  On 09/04/2021 patient underwent Medtronic Micra AV leadless pacemaker via right femoral vein without complication.  Patient had an uncomplicated hospital day overnight on telemetry which revealed circular paced rhythm at 65 bpm.  On the morning of 09/05/2021 the patient was ambulating without difficulty and was discharged home.   Discharge Exam: Blood pressure (!) 146/78, pulse 60, temperature 98.3 F (36.8 C), resp. rate 18, height 5\' 10"  (1.778 m), weight 119.7 kg, SpO2 100 %.   General appearance: alert Head: Normocephalic, without obvious abnormality, atraumatic Eyes: conjunctivae/corneas clear. PERRL, EOM's intact. Fundi benign. Ears: normal TM's and external ear canals both ears Nose: Nares normal. Septum midline. Mucosa normal. No drainage or sinus tenderness. Throat: lips, mucosa, and tongue normal; teeth and gums normal Neck: no adenopathy, no carotid bruit, no JVD, supple, symmetrical, trachea midline, and thyroid not enlarged, symmetric, no tenderness/mass/nodules Back: symmetric, no curvature. ROM normal. No CVA tenderness. Resp: clear to auscultation bilaterally Chest wall: no tenderness Cardio: regular rate and rhythm GI: soft, non-tender; bowel sounds normal; no masses,  no organomegaly Extremities: extremities normal, atraumatic, no cyanosis or edema Pulses: 2+ and symmetric Skin: Skin color, texture, turgor normal. No rashes or lesions Lymph nodes: Cervical, supraclavicular, and axillary nodes normal. Neurologic: Grossly  normal Incision/Wound: Right groin puncture site without bleeding or hematoma Labs:   Lab Results  Component Value Date   WBC 10.1 11/11/2020   HGB 15.7 11/11/2020   HCT 48.6 06/06/2021   MCV 91.1 11/11/2020   PLT 166.0 11/11/2020   No results for input(s): NA, K, CL, CO2, BUN, CREATININE, CALCIUM, PROT, BILITOT, ALKPHOS, ALT, AST, GLUCOSE in the last 168 hours.  Invalid input(s): LABALBU    Radiology:  EKG: Reticular paced rhythm 62 bpm  FOLLOW UP PLANS AND APPOINTMENTS  Allergies as of 09/05/2021       Reactions   Codeine Other (See Comments)   Other reaction(s): Other (See Comments) HEADACHES Causes headaches when he stops liquid Codeine-based products   Ace Inhibitors Cough   Cough also present with ARB (losartan)        Medication List     TAKE these medications    acetaminophen 650 MG CR tablet Commonly known as: Tylenol 8 Hour Arthritis Pain Take 2 tablets (1,300 mg total) by mouth 2 (two) times daily as needed for pain.   albuterol 108 (90 Base) MCG/ACT inhaler Commonly known as: VENTOLIN HFA Inhale 2 puffs into the lungs every 6 (six) hours as needed for wheezing or shortness of breath.   albuterol (2.5 MG/3ML) 0.083% nebulizer solution Commonly known as: PROVENTIL Take 3 mLs (2.5 mg total) by nebulization every 6 (six) hours as needed for wheezing or shortness of breath.   allopurinol 300 MG tablet Commonly known as: ZYLOPRIM Take 1 tablet (300 mg total) by mouth daily.   amLODipine 5 MG tablet Commonly known as: NORVASC Take 1 tablet (5 mg total) by mouth daily.   atorvastatin 40 MG tablet Commonly known as: LIPITOR Take 1 tablet (40 mg total) by mouth daily.   diclofenac sodium 1 % Gel Commonly known as: VOLTAREN Apply 2 g topically daily as needed (pain). Apply to both hands.  Eliquis 5 MG Tabs tablet Generic drug: apixaban Take 5 mg by mouth 2 (two) times daily.   fluticasone 50 MCG/ACT nasal spray Commonly known as: FLONASE SHAKE  LIQUID AND USE 2 SPRAYS IN EACH NOSTRIL DAILY   furosemide 40 MG tablet Commonly known as: LASIX TAKE ONE TABLET BY MOUTH ONCE DAILY as needed FOR EDEMA What changed: See the new instructions.   metaxalone 800 MG tablet Commonly known as: SKELAXIN Take 1 tablet (800 mg total) by mouth 3 (three) times daily as needed for muscle spasms.   Multiple Vitamins tablet Take 1 tablet by mouth daily.   sildenafil 100 MG tablet Commonly known as: VIAGRA Take 100 mg by mouth as needed for erectile dysfunction.   Spiriva HandiHaler 18 MCG inhalation capsule Generic drug: tiotropium Place 1 capsule (18 mcg total) into inhaler and inhale daily.   tadalafil 20 MG tablet Commonly known as: CIALIS Take 20 mg by mouth daily as needed for erectile dysfunction.   testosterone cypionate 200 MG/ML injection Commonly known as: DEPOTESTOSTERONE CYPIONATE Inject 0.35 mLs (70 mg total) into the muscle once a week. What changed:  how much to take additional instructions   traMADol 50 MG tablet Commonly known as: ULTRAM Take 1 tablet (50 mg total) by mouth every 12 (twelve) hours as needed for moderate pain.   triamterene-hydrochlorothiazide 37.5-25 MG tablet Commonly known as: MAXZIDE-25 Take 1 tablet by mouth daily.   zolpidem 10 MG tablet Commonly known as: AMBIEN TAKE 1 TABLET(10 MG) BY MOUTH AT BEDTIME AS NEEDED FOR SLEEP        Follow-up Information     Corey Skains, MD Follow up in 1 week(s).   Specialty: Cardiology Contact information: [address] [facility] [place] [address] [phone]                 BRING ALL MEDICATIONS WITH YOU TO FOLLOW UP APPOINTMENTS  Time spent with patient to include physician time: 25 minutes Signed:  Isaias Cowman MD, PhD, [facility] 09/05/2021, 8:09 AM

## 2021-09-05 NOTE — Progress Notes (Signed)
Discharged instructions explained to pt/ iv and tele removed/ transported off unit via wheelchair.

## 2021-09-07 ENCOUNTER — Encounter: Payer: Self-pay | Admitting: Family Medicine

## 2021-09-07 DIAGNOSIS — Z950 Presence of cardiac pacemaker: Secondary | ICD-10-CM | POA: Insufficient documentation

## 2021-09-16 ENCOUNTER — Other Ambulatory Visit: Payer: Self-pay | Admitting: *Deleted

## 2021-09-16 DIAGNOSIS — E291 Testicular hypofunction: Secondary | ICD-10-CM

## 2021-09-17 DIAGNOSIS — I480 Paroxysmal atrial fibrillation: Secondary | ICD-10-CM | POA: Diagnosis not present

## 2021-09-17 DIAGNOSIS — I482 Chronic atrial fibrillation, unspecified: Secondary | ICD-10-CM | POA: Diagnosis not present

## 2021-09-17 DIAGNOSIS — I10 Essential (primary) hypertension: Secondary | ICD-10-CM | POA: Diagnosis not present

## 2021-09-17 DIAGNOSIS — E782 Mixed hyperlipidemia: Secondary | ICD-10-CM | POA: Diagnosis not present

## 2021-09-17 DIAGNOSIS — I495 Sick sinus syndrome: Secondary | ICD-10-CM | POA: Diagnosis not present

## 2021-09-18 ENCOUNTER — Encounter: Payer: Self-pay | Admitting: Family Medicine

## 2021-09-20 ENCOUNTER — Other Ambulatory Visit: Payer: Self-pay | Admitting: Family Medicine

## 2021-09-21 NOTE — Telephone Encounter (Signed)
Recent Labs 08-18-21 had BUN: 35, Creatinine: 1.4 With BUN of 35, will forward to Dr Darnell Level for furosemide refill.

## 2021-09-23 DIAGNOSIS — G4733 Obstructive sleep apnea (adult) (pediatric): Secondary | ICD-10-CM | POA: Diagnosis not present

## 2021-09-23 NOTE — Telephone Encounter (Signed)
ERx 

## 2021-09-29 ENCOUNTER — Other Ambulatory Visit: Payer: Self-pay | Admitting: *Deleted

## 2021-10-01 DIAGNOSIS — E113212 Type 2 diabetes mellitus with mild nonproliferative diabetic retinopathy with macular edema, left eye: Secondary | ICD-10-CM | POA: Diagnosis not present

## 2021-10-01 LAB — HM DIABETES EYE EXAM

## 2021-10-03 MED ORDER — TESTOSTERONE CYPIONATE 200 MG/ML IM SOLN: 80.0000 mg | INTRAMUSCULAR | 0 refills | Status: DC

## 2021-10-06 ENCOUNTER — Other Ambulatory Visit: Payer: PPO

## 2021-10-06 ENCOUNTER — Other Ambulatory Visit: Payer: Self-pay

## 2021-10-06 DIAGNOSIS — E291 Testicular hypofunction: Secondary | ICD-10-CM

## 2021-10-07 DIAGNOSIS — M5416 Radiculopathy, lumbar region: Secondary | ICD-10-CM | POA: Diagnosis not present

## 2021-10-07 DIAGNOSIS — M48062 Spinal stenosis, lumbar region with neurogenic claudication: Secondary | ICD-10-CM | POA: Diagnosis not present

## 2021-10-07 LAB — TESTOSTERONE: Testosterone: 585 ng/dL (ref 264–916)

## 2021-10-07 LAB — HEMATOCRIT: Hematocrit: 55.1 % — ABNORMAL HIGH (ref 37.5–51.0)

## 2021-10-07 LAB — PSA: Prostate Specific Ag, Serum: 1.4 ng/mL (ref 0.0–4.0)

## 2021-10-08 ENCOUNTER — Encounter: Payer: Self-pay | Admitting: Urology

## 2021-10-08 ENCOUNTER — Ambulatory Visit: Payer: PPO | Admitting: Urology

## 2021-10-08 ENCOUNTER — Other Ambulatory Visit: Payer: Self-pay

## 2021-10-08 VITALS — BP 135/85 | HR 85 | Ht 70.0 in | Wt 231.0 lb

## 2021-10-08 DIAGNOSIS — E291 Testicular hypofunction: Secondary | ICD-10-CM

## 2021-10-08 MED ORDER — TESTOSTERONE CYPIONATE 200 MG/ML IM SOLN: 80.0000 mg | INTRAMUSCULAR | 0 refills | Status: DC

## 2021-10-08 NOTE — Progress Notes (Signed)
? ?10/08/2021 ?9:32 AM  ? ?Aaron Mitchell ?Nov 14, 1945 ?[id number] ? ?Referring provider: [name], MD ?[address] ?[place],  [place] [address] ? ?Chief Complaint  ?Patient presents with  ? Hypogonadism  ? ? ?Urologic history: ?1.  Hypogonadism ?-Symptoms tiredness, fatigue, ED, decreased libido ?-TRT  testosterone cypionate weekly ? ? ?HPI: ?76 y.o. male presents for annual follow-up. ? ?Long history of hypogonadism previously followed by Dr. [name] and most recently Dr. [name] ?No bothersome LUTS ?Remains on TRT 100 mg weekly ?Labs 10/06/21: Testosterone 585; hematocrit 55.1; PSA stable 1.4 ?Uses tadalafil occasionally ?Had pacemaker recently for bradycardia ? ? ?PMH: ?Past Medical History:  ?Diagnosis Date  ? Achilles tendinitis   ? Left leg  ? Borderline diabetes mellitus   ? Childhood asthma   ? Degenerative disc disease, lumbar   ? rec against surgery  ? Erectile dysfunction   ? GI bleed 12/2015  ? hospitalization  ? Gout   ? Hypertension   ? Ileus ([place]) 12/2015  ? hospitalization   ? Male hypogonadism   ? followed by urology on T injections [name])  ? Seasonal allergies   ? pollen  ? Severe obesity (BMI >= 40) (H[facility])   ? Shoulder bursitis   ? Sleep apnea   ? CPAP  ? ? ?Surgical History: ?Past Surgical History:  ?Procedure Laterality Date  ? ADENOIDECTOMY    ? CARPAL TUNNEL RELEASE Right 2015  ? CATARACT EXTRACTION W/PHACO Right 07/15/2016  ? Procedure: CATARACT EXTRACTION PHACO AND INTRAOCULAR LENS PLACEMENT (IOC);  Surgeon: Leandrew Koyanagi, MD;  Location: [facility];  Service: Ophthalmology;  Laterality: Right;  TORIC ?sleep apnea  ? CATARACT EXTRACTION W/PHACO Left 08/05/2016  ? Procedure: CATARACT EXTRACTION PHACO AND INTRAOCULAR LENS PLACEMENT (IOC) toric;  Surgeon: Leandrew Koyanagi, MD;  Location: [facility];  Service: Ophthalmology;  Laterality: Left;  left ?sleep apnea ?toric  ? COLONOSCOPY  10/2012  ? rpt 5 yrs ([facility])  ? COLONOSCOPY WITH PROPOFOL N/A  03/20/2021  ? mult TAs, rpt 5 yrs Allen Norris, Darren, MD)  ? EYE SURGERY    ? Lens replacement 07/12/2016,08/05/2016  ? LUMBAR EPIDURAL INJECTION Bilateral 04/2017, 12/2017  ? S1 transforaminal ESI ([place])  ? PACEMAKER LEADLESS INSERTION N/A 09/04/2021  ? Procedure: PACEMAKER LEADLESS INSERTION;  Surgeon: Isaias Cowman, MD;  Location: [facility];  Service: Cardiovascular;  Laterality: N/A;  ? RHINOPLASTY    ? TONSILLECTOMY  ?1954  ? TRIGGER FINGER RELEASE Right 2015  ? 4th  ? ? ?Home Medications:  ?Allergies as of 10/08/2021   ? ?   Reactions  ? Codeine Other (See Comments)  ? Other reaction(s): Other (See Comments) ?HEADACHES ?Causes headaches when he stops liquid Codeine-based products  ? Ace Inhibitors Cough  ? Cough also present with ARB (losartan)  ? ?  ? ?  ?Medication List  ?  ? ?  ? Accurate as of October 08, 2021  9:32 AM. If you have any questions, ask your nurse or doctor.  ?  ?  ? ?  ? ?acetaminophen 650 MG CR tablet ?Commonly known as: Tylenol 8 Hour Arthritis Pain ?Take 2 tablets (1,300 mg total) by mouth 2 (two) times daily as needed for pain. ?  ?albuterol 108 (90 Base) MCG/ACT inhaler ?Commonly known as: VENTOLIN HFA ?Inhale 2 puffs into the lungs every 6 (six) hours as needed for wheezing or shortness of breath. ?  ?albuterol (2.5 MG/3ML) 0.083% nebulizer solution ?Commonly known as: PROVENTIL ?Take 3 mLs (2.5 mg  total) by nebulization every 6 (six) hours as needed for wheezing or shortness of breath. ?  ?allopurinol 300 MG tablet ?Commonly known as: ZYLOPRIM ?Take 1 tablet (300 mg total) by mouth daily. ?  ?amLODipine 5 MG tablet ?Commonly known as: NORVASC ?Take 1 tablet (5 mg total) by mouth daily. ?  ?atorvastatin 40 MG tablet ?Commonly known as: LIPITOR ?Take 1 tablet (40 mg total) by mouth daily. ?  ?diclofenac sodium 1 % Gel ?Commonly known as: VOLTAREN ?Apply 2 g topically daily as needed (pain). Apply to both hands. ?  ?Eliquis 5 MG Tabs tablet ?Generic drug: apixaban ?Take 5 mg by  mouth 2 (two) times daily. ?  ?fluticasone 50 MCG/ACT nasal spray ?Commonly known as: FLONASE ?SHAKE LIQUID AND USE 2 SPRAYS IN EACH NOSTRIL DAILY ?  ?furosemide 40 MG tablet ?Commonly known as: LASIX ?TAKE ONE TABLET BY MOUTH ONCE DAILY AS NEEDED FOR EDEMA ?  ?metaxalone 800 MG tablet ?Commonly known as: SKELAXIN ?Take 1 tablet (800 mg total) by mouth 3 (three) times daily as needed for muscle spasms. ?  ?Multiple Vitamins tablet ?Take 1 tablet by mouth daily. ?  ?sildenafil 100 MG tablet ?Commonly known as: VIAGRA ?Take 100 mg by mouth as needed for erectile dysfunction. ?  ?Spiriva HandiHaler 18 MCG inhalation capsule ?Generic drug: tiotropium ?Place 1 capsule (18 mcg total) into inhaler and inhale daily. ?  ?tadalafil 20 MG tablet ?Commonly known as: CIALIS ?Take 20 mg by mouth daily as needed for erectile dysfunction. ?  ?testosterone cypionate 200 MG/ML injection ?Commonly known as: DEPOTESTOSTERONE CYPIONATE ?Inject 0.4 mLs (80 mg total) into the muscle once a week. 0.4 mL ?  ?traMADol 50 MG tablet ?Commonly known as: ULTRAM ?Take 1 tablet (50 mg total) by mouth every 12 (twelve) hours as needed for moderate pain. ?  ?triamterene-hydrochlorothiazide 37.5-25 MG tablet ?Commonly known as: MAXZIDE-25 ?Take 1 tablet by mouth daily. ?  ?zolpidem 10 MG tablet ?Commonly known as: AMBIEN ?TAKE 1 TABLET(10 MG) BY MOUTH AT BEDTIME AS NEEDED FOR SLEEP ?  ? ?  ? ? ?Allergies:  ?Allergies  ?Allergen Reactions  ? Codeine Other (See Comments)  ?  Other reaction(s): Other (See Comments) ?HEADACHES ?Causes headaches when he stops liquid Codeine-based products  ? Ace Inhibitors Cough  ?  Cough also present with ARB (losartan)  ? ? ?Family History: ?Family History  ?Problem Relation Age of Onset  ? Alcohol abuse Father   ? Hypertension Father   ? AVM Father 25  ?     hemorrhagic stroke  ? Diabetes Paternal Grandmother   ? Cancer Neg Hx   ? CAD Neg Hx   ? Prostate cancer Neg Hx   ? Bladder Cancer Neg Hx   ? Kidney cancer Neg Hx    ? ? ?Social History:  reports that he quit smoking about 43 years ago. His smoking use included cigarettes. He has never used smokeless tobacco. He reports current alcohol use of about 5.0 standard drinks per week. He reports that he does not use drugs. ? ? ?Physical Exam: ?BP 135/85   Pulse 85   Ht '5\' 10"'$  (1.778 m)   Wt 231 lb (104.8 kg)   BMI 33.15 kg/m?   ?Constitutional:  Alert and oriented, No acute distress. ?HEENT: [place] AT, moist mucus membranes.  Trachea midline, no masses. ?Cardiovascular: No clubbing, cyanosis, or edema. ?Respiratory: Normal respiratory effort, no increased work of breathing. ?Psychiatric: Normal mood and affect. ? ? ?Assessment & Plan:   ? ?  1.  Hypogonadism ?Stable symptoms on TRT ?Lab visit 6 months for testosterone, hematocrit ?Office visit 12 months testosterone, hematocrit, PSA and DRE ?Testosterone refill ? ?2.  Erythrocytosis ?Secondary TRT ?He is able to donate blood and recommend that he donate soon and quarterly ? ? ?Abbie Sons, MD ? ?[facility] ?[address], [address] ?[place], [place] [address] ?[phone] ? ?

## 2021-10-14 DIAGNOSIS — G4733 Obstructive sleep apnea (adult) (pediatric): Secondary | ICD-10-CM | POA: Diagnosis not present

## 2021-10-21 DIAGNOSIS — G4733 Obstructive sleep apnea (adult) (pediatric): Secondary | ICD-10-CM | POA: Diagnosis not present

## 2021-10-27 ENCOUNTER — Other Ambulatory Visit: Payer: Self-pay | Admitting: Family Medicine

## 2021-10-27 ENCOUNTER — Telehealth: Payer: Self-pay

## 2021-10-27 NOTE — Chronic Care Management (AMB) (Signed)
? ? ?Chronic Care Management ?Pharmacy Assistant  ? ?Name: Aaron Mitchell  MRN: [id number] DOB: [birth year] ? ?Reason for Encounter: Medication Adherence and Delivery Coordination ? ?Recent office visits:  ?08/13/21-PCP-Javier Gutierrez,MD-Left hand cyst-normal xray-use voltaren gel for tenderness ?08/07/21-Family Medicine- Frederico Hamman [name],MD- Bilateral hip pain- bilateral joint kenalog injections  ? ?Recent consult visits:  ?10/08/21-Urology-Scott Stoioff,MD- Annual follow up-stable on TRT, ?[facility]- lumbar steroid injection-dexamethasone ?09/17/21-Cardiology- Edgar Frisk- follow up Afib, pacemaker, ECG- no medication changes ?08/18/21-Cardiology-Pre-Op testing-pacemaker implant-no medicine changes ? ?Hospital visits:  ?Medication Reconciliation was completed by comparing discharge summary, patient?s EMR and Pharmacy list, and upon discussion with patient. ? ?Admitted to the hospital on 09/04/21 due to Bradycardia and sick sinus syndrome. Discharge date was 09/05/21. Discharged from [facility]   ? ?Medications that remain the same after Hospital Discharge:??  ?-All other medications will remain the same.   ? ?Medications: ?Outpatient Encounter Medications as of 10/27/2021  ?Medication Sig  ? acetaminophen (TYLENOL 8 HOUR ARTHRITIS PAIN) 650 MG CR tablet Take 2 tablets (1,300 mg total) by mouth 2 (two) times daily as needed for pain.  ? albuterol (PROVENTIL) (2.5 MG/3ML) 0.083% nebulizer solution Take 3 mLs (2.5 mg total) by nebulization every 6 (six) hours as needed for wheezing or shortness of breath.  ? albuterol (VENTOLIN HFA) 108 (90 Base) MCG/ACT inhaler Inhale 2 puffs into the lungs every 6 (six) hours as needed for wheezing or shortness of breath.  ? allopurinol (ZYLOPRIM) 300 MG tablet Take 1 tablet (300 mg total) by mouth daily.  ? amLODipine (NORVASC) 5 MG tablet Take 1 tablet (5 mg total) by mouth daily.  ? apixaban (ELIQUIS) 5 MG TABS tablet Take 5 mg by mouth 2 (two)  times daily.  ? atorvastatin (LIPITOR) 40 MG tablet Take 1 tablet (40 mg total) by mouth daily.  ? diclofenac sodium (VOLTAREN) 1 % GEL Apply 2 g topically daily as needed (pain). Apply to both hands.  ? fluticasone (FLONASE) 50 MCG/ACT nasal spray SHAKE LIQUID AND USE 2 SPRAYS IN EACH NOSTRIL DAILY  ? furosemide (LASIX) 40 MG tablet TAKE ONE TABLET BY MOUTH ONCE DAILY AS NEEDED FOR EDEMA  ? metaxalone (SKELAXIN) 800 MG tablet Take 1 tablet (800 mg total) by mouth 3 (three) times daily as needed for muscle spasms.  ? Multiple Vitamins tablet Take 1 tablet by mouth daily.  ? sildenafil (VIAGRA) 100 MG tablet Take 100 mg by mouth as needed for erectile dysfunction.  ? tadalafil (CIALIS) 20 MG tablet Take 20 mg by mouth daily as needed for erectile dysfunction.  ? testosterone cypionate (DEPOTESTOSTERONE CYPIONATE) 200 MG/ML injection Inject 0.4 mLs (80 mg total) into the muscle once a week.  ? tiotropium (SPIRIVA HANDIHALER) 18 MCG inhalation capsule Place 1 capsule (18 mcg total) into inhaler and inhale daily.  ? traMADol (ULTRAM) 50 MG tablet Take 1 tablet (50 mg total) by mouth every 12 (twelve) hours as needed for moderate pain.  ? triamterene-hydrochlorothiazide (MAXZIDE-25) 37.5-25 MG tablet Take 1 tablet by mouth daily.  ? zolpidem (AMBIEN) 10 MG tablet TAKE 1 TABLET(10 MG) BY MOUTH AT BEDTIME AS NEEDED FOR SLEEP  ? ?Facility-Administered Encounter Medications as of 10/27/2021  ?Medication  ? methylPREDNISolone acetate (DEPO-MEDROL) injection 80 mg  ? ?BP Readings from Last 3 Encounters:  ?10/08/21 135/85  ?09/05/21 (!) 146/78  ?08/13/21 134/76  ?  ?Lab Results  ?Component Value Date  ? HGBA1C 6.7 (A) 08/13/2021  ?  ? ? ?Recent OV, Consult or Hospital visit:  Cardiology,Family Medicine, [facility] ?No medication changes indicated ? ? ?Last adherence delivery date:08/11/21     ? ?Patient is due for next adherence delivery on: 11/07/21 ( 11/06/21 requested 1 day early due to vacation) ? ?Spoke with patient on  10/29/21 reviewed medications and coordinated delivery. ? ?This delivery to include: Adherence Packaging  90 Days  ?Packs: ?Triamterene 37.5 mg-HCTZ 25 mg 1 tablet  before breakfast ?Allopurinol 300 mg 1 tablet before breakfast  ?Amlodipine 5 mg 1 tablet before breakfast  ?Atorvastatin 40 mg 1 tablet before breakfast  ?Eliquis  '5mg'$   take 1 tablet breakfast 1 tablet evening meal  ? ?VIAL medications: ?Fluticasone 44mg/act 2 sprays each nostril daily as needed ?Testosterone '200mg'$ /ml  ? ?Patient declined the following medications this month: ?Furosemide 40 mg - patient takes as needed and filled 07/02/21 90 DS ? ?Any concerns about your medications? No ? ?How often do you forget or accidentally miss a dose? Never ? ?Is patient in packaging Yes ? What is the date on your next pill pack? Not near pack to identify date  ? Any concerns or issues with your packaging? Happy with process of packs  ? ? ?Refills requested from providers include: atorvastatin,allopurinol,amlodipine fluticasone 517m/act,triamterene  ? ?Confirmed delivery date of 11/06/21, advised patient that pharmacy will contact them the morning of delivery. Patient requests delivery to be 1 day earlier due to vacation  ? ?Recent blood pressure readings are as follows: none available  ? ? ?Recent blood glucose readings are as follows: New A1c 6.7  08/13/21 ? ? ?Annual wellness visit in last year? Yes ?Most Recent BP reading:135/85  85-P  10/08/21 ? ? ?LiCharlene BrookeCPP notified ? ?Carys Malina, CCMA ?Health concierge  ?[phone]?

## 2021-10-30 ENCOUNTER — Encounter: Payer: Self-pay | Admitting: Family Medicine

## 2021-10-30 ENCOUNTER — Ambulatory Visit (INDEPENDENT_AMBULATORY_CARE_PROVIDER_SITE_OTHER): Payer: PPO | Admitting: Family Medicine

## 2021-10-30 VITALS — BP 132/80 | HR 81 | Temp 98.3°F | Ht 69.25 in | Wt 238.5 lb

## 2021-10-30 DIAGNOSIS — F321 Major depressive disorder, single episode, moderate: Secondary | ICD-10-CM

## 2021-10-30 MED ORDER — FLUOXETINE HCL 20 MG PO CAPS: 20.0000 mg | ORAL_CAPSULE | Freq: Every day | ORAL | 1 refills | Status: DC

## 2021-10-30 NOTE — Patient Instructions (Signed)
For one week, take fluoxetine every other day, then increase to 1 tablet a day. ?

## 2021-10-30 NOTE — Progress Notes (Signed)
? ? ?Aaron Lomas T. Aaron Hellmer, MD, [facility] ?Therapist, music at [facility] ?[place] ?[place], [address] ? ?Phone: [phone]  FAX: [phone] ? ?Aaron Mitchell - 76 y.o. male  MRN [id number]  Date of Birth: [birth year] ? ?Date: 10/30/2021  PCP: [name], MD  Referral: [name], MD ? ?Chief Complaint  ?Patient presents with  ? Depression  ? ? ?This visit occurred during the SARS-CoV-2 public health emergency.  Safety protocols were in place, including screening questions prior to the visit, additional usage of staff PPE, and extensive cleaning of exam room while observing appropriate contact time as indicated for disinfecting solutions.  ? ?Subjective:  ? ?Aaron Mitchell is a 76 y.o. very pleasant male patient with Body mass index is 34.97 kg/m?. who presents with the following: ? ?Acute depression: See below ?  ?Lost 50 pounds.  ? ?Does not want to do anything.  Decreased interest.  Sleeping ok. ?Slug energy level. ?Felt better after his pacemaker.  In Feb. ?HR - was around 40. ? ?No guilt.  ?Wife's memory is starting to fade.  ?Worried a lot about that.  ? ?January - wife had a stroke. ?Memory was already starting to have some issues.  ? ?No SI/HI. ? ?CBC and test. ? ?TSH normal last year.  ? ? ? ?Review of Systems is noted in the HPI, as appropriate ? ?Objective:  ? ?BP 132/80   Pulse 81   Temp 98.3 ?F (36.8 ?C) (Oral)   Ht 5' 9.25" (1.759 m)   Wt 238 lb 8 oz (108.2 kg)   SpO2 99%   BMI 34.97 kg/m?  ? ?GEN: No acute distress; alert,appropriate. ?PULM: Breathing comfortably in no respiratory distress ?PSYCH: Normally interactive.  ? ?Laboratory and Imaging Data: ? ?Assessment and Plan:  ? ?  ICD-10-CM   ?1. Current moderate episode of major depressive disorder without prior episode ([name])  F32.1   ?  ? ?Total encounter time: 30 minutes. This includes total time spent on the day of encounter.  He does not have a history of depression in the past, but over  the last 4 to 5 months he has had some progressive change in his mood.  He is predominantly having some anhedonia, decreased interest, decreased energy and he is not finding enjoyment in anything.  He is not really having any crying spells to speak of.  He does not have any SI or HI.  No guilt.  He is more or less sleeping okay. ? ?His wife has had some progressive loss of memory, she also had a recent stroke.  This has been challenging for him, and he is doing the best he can coping with it. ? ?He has not been exercising as much, but he has lost 50 pounds. ? ?We talked about this and I am going to start the patient on Prozac.  He will follow-up with his primary care doctor in roughly 6 weeks. ? ?Patient Instructions  ?For one week, take fluoxetine every other day, then increase to 1 tablet a day.  ? ?Meds ordered this encounter  ?Medications  ? FLUoxetine (PROZAC) 20 MG capsule  ?  Sig: Take 1 capsule (20 mg total) by mouth daily.  ?  Dispense:  90 capsule  ?  Refill:  1  ? ?Medications Discontinued During This Encounter  ?Medication Reason  ? methylPREDNISolone acetate (DEPO-MEDROL) injection 80 mg   ? ?No orders of the defined types were placed in this encounter. ? ? ?  Follow-up: Return in about 6 weeks (around 12/11/2021) for Depression recheck with Dr. [name]. ? ?Dragon Medical One speech-to-text software was used for transcription in this dictation.  Possible transcriptional errors can occur using Editor, commissioning.  ? ?Signed, ? ?Carlisle Torgeson T. Yariana Hoaglund, MD ? ? ?Outpatient Encounter Medications as of 10/30/2021  ?Medication Sig  ? acetaminophen (TYLENOL 8 HOUR ARTHRITIS PAIN) 650 MG CR tablet Take 2 tablets (1,300 mg total) by mouth 2 (two) times daily as needed for pain.  ? albuterol (PROVENTIL) (2.5 MG/3ML) 0.083% nebulizer solution Take 3 mLs (2.5 mg total) by nebulization every 6 (six) hours as needed for wheezing or shortness of breath.  ? albuterol (VENTOLIN HFA) 108 (90 Base) MCG/ACT inhaler Inhale 2 puffs  into the lungs every 6 (six) hours as needed for wheezing or shortness of breath.  ? allopurinol (ZYLOPRIM) 300 MG tablet TAKE ONE TABLET BY MOUTH BEFORE BREAKFAST  ? amLODipine (NORVASC) 5 MG tablet TAKE ONE TABLET BY MOUTH BEFORE BREAKFAST  ? apixaban (ELIQUIS) 5 MG TABS tablet Take 5 mg by mouth 2 (two) times daily.  ? atorvastatin (LIPITOR) 40 MG tablet TAKE ONE TABLET BY MOUTH BEFORE BREAKFAST  ? diclofenac sodium (VOLTAREN) 1 % GEL Apply 2 g topically daily as needed (pain). Apply to both hands.  ? FLUoxetine (PROZAC) 20 MG capsule Take 1 capsule (20 mg total) by mouth daily.  ? fluticasone (FLONASE) 50 MCG/ACT nasal spray USE TWO SPRAYS in each nostril daily(shake liquid)  ? furosemide (LASIX) 40 MG tablet TAKE ONE TABLET BY MOUTH ONCE DAILY AS NEEDED FOR EDEMA  ? metaxalone (SKELAXIN) 800 MG tablet Take 1 tablet (800 mg total) by mouth 3 (three) times daily as needed for muscle spasms.  ? Multiple Vitamins tablet Take 1 tablet by mouth daily.  ? sildenafil (VIAGRA) 100 MG tablet Take 100 mg by mouth as needed for erectile dysfunction.  ? tadalafil (CIALIS) 20 MG tablet Take 20 mg by mouth daily as needed for erectile dysfunction.  ? testosterone cypionate (DEPOTESTOSTERONE CYPIONATE) 200 MG/ML injection Inject 0.4 mLs (80 mg total) into the muscle once a week.  ? tiotropium (SPIRIVA HANDIHALER) 18 MCG inhalation capsule Place 1 capsule (18 mcg total) into inhaler and inhale daily.  ? traMADol (ULTRAM) 50 MG tablet Take 1 tablet (50 mg total) by mouth every 12 (twelve) hours as needed for moderate pain.  ? triamterene-hydrochlorothiazide (MAXZIDE-25) 37.5-25 MG tablet TAKE ONE TABLET BY MOUTH BEFORE BREAKFAST  ? zolpidem (AMBIEN) 10 MG tablet TAKE 1 TABLET(10 MG) BY MOUTH AT BEDTIME AS NEEDED FOR SLEEP  ? [DISCONTINUED] methylPREDNISolone acetate (DEPO-MEDROL) injection 80 mg   ? ?No facility-administered encounter medications on file as of 10/30/2021.  ?  ?

## 2021-11-03 DIAGNOSIS — H3509 Other intraretinal microvascular abnormalities: Secondary | ICD-10-CM | POA: Diagnosis not present

## 2021-11-07 ENCOUNTER — Encounter: Payer: Self-pay | Admitting: Family Medicine

## 2021-11-21 DIAGNOSIS — G4733 Obstructive sleep apnea (adult) (pediatric): Secondary | ICD-10-CM | POA: Diagnosis not present

## 2021-12-03 ENCOUNTER — Ambulatory Visit (INDEPENDENT_AMBULATORY_CARE_PROVIDER_SITE_OTHER): Payer: PPO

## 2021-12-03 VITALS — Wt 238.0 lb

## 2021-12-03 DIAGNOSIS — Z0000 Encounter for general adult medical examination without abnormal findings: Secondary | ICD-10-CM | POA: Diagnosis not present

## 2021-12-03 NOTE — Progress Notes (Signed)
?Virtual Visit via Telephone Note ? ?I connected with  Aaron Mitchell on 12/03/21 at  2:30 PM EDT by telephone and verified that I am speaking with the correct person using two identifiers. ? ?Location: ?Patient: home ?Provider: [facility] ?Persons participating in the virtual visit: patient/Nurse Health Advisor ?  ?I discussed the limitations, risks, security and privacy concerns of performing an evaluation and management service by telephone and the availability of in person appointments. The patient expressed understanding and agreed to proceed. ? ?Interactive audio and video telecommunications were attempted between this nurse and patient, however failed, due to patient having technical difficulties OR patient did not have access to video capability.  We continued and completed visit with audio only. ? ?Some vital signs may be absent or patient reported.  ? ?Aaron David, LPN ? ?Subjective:  ? Aaron Mitchell is a 76 y.o. male who presents for Medicare Annual/Subsequent preventive examination. ? ?Review of Systems    ? ?  ? ?   ?Objective:  ?  ?There were no vitals filed for this visit. ?There is no height or weight on file to calculate BMI. ? ? ?  09/04/2021  ?  7:05 AM 10/09/2020  ? 11:10 AM 07/25/2019  ? 11:16 AM 07/05/2018  ? 10:28 AM 07/01/2017  ?  9:08 AM 08/05/2016  ?  9:39 AM 07/15/2016  ?  8:47 AM  ?Advanced Directives  ?Does Patient Have a Medical Advance Directive? No Yes Yes Yes Yes Yes Yes  ?Type of Corporate treasurer of [place];Living will [place];Living will [place];Living will [place];Living will [place];Living will [place];Living will  ?Copy of [place] in Chart?  No - copy requested Yes - validated most recent copy scanned in chart (See row information) No - copy requested No - copy requested Yes Yes  ?Would patient like information on creating a  medical advance directive? No - Patient declined        ? ? ?Current Medications (verified) ?Outpatient Encounter Medications as of 12/03/2021  ?Medication Sig  ? acetaminophen (TYLENOL 8 HOUR ARTHRITIS PAIN) 650 MG CR tablet Take 2 tablets (1,300 mg total) by mouth 2 (two) times daily as needed for pain.  ? albuterol (PROVENTIL) (2.5 MG/3ML) 0.083% nebulizer solution Take 3 mLs (2.5 mg total) by nebulization every 6 (six) hours as needed for wheezing or shortness of breath.  ? albuterol (VENTOLIN HFA) 108 (90 Base) MCG/ACT inhaler Inhale 2 puffs into the lungs every 6 (six) hours as needed for wheezing or shortness of breath.  ? allopurinol (ZYLOPRIM) 300 MG tablet TAKE ONE TABLET BY MOUTH BEFORE BREAKFAST  ? amLODipine (NORVASC) 5 MG tablet TAKE ONE TABLET BY MOUTH BEFORE BREAKFAST  ? apixaban (ELIQUIS) 5 MG TABS tablet Take 5 mg by mouth 2 (two) times daily.  ? atorvastatin (LIPITOR) 40 MG tablet TAKE ONE TABLET BY MOUTH BEFORE BREAKFAST  ? diclofenac sodium (VOLTAREN) 1 % GEL Apply 2 g topically daily as needed (pain). Apply to both hands.  ? FLUoxetine (PROZAC) 20 MG capsule Take 1 capsule (20 mg total) by mouth daily.  ? fluticasone (FLONASE) 50 MCG/ACT nasal spray USE TWO SPRAYS in each nostril daily(shake liquid)  ? furosemide (LASIX) 40 MG tablet TAKE ONE TABLET BY MOUTH ONCE DAILY AS NEEDED FOR EDEMA  ? metaxalone (SKELAXIN) 800 MG tablet Take 1 tablet (800 mg total) by mouth 3 (three) times daily as  needed for muscle spasms.  ? Multiple Vitamins tablet Take 1 tablet by mouth daily.  ? sildenafil (VIAGRA) 100 MG tablet Take 100 mg by mouth as needed for erectile dysfunction.  ? tadalafil (CIALIS) 20 MG tablet Take 20 mg by mouth daily as needed for erectile dysfunction.  ? testosterone cypionate (DEPOTESTOSTERONE CYPIONATE) 200 MG/ML injection Inject 0.4 mLs (80 mg total) into the muscle once a week.  ? tiotropium (SPIRIVA HANDIHALER) 18 MCG inhalation capsule Place 1 capsule (18 mcg total) into inhaler and  inhale daily.  ? traMADol (ULTRAM) 50 MG tablet Take 1 tablet (50 mg total) by mouth every 12 (twelve) hours as needed for moderate pain.  ? triamterene-hydrochlorothiazide (MAXZIDE-25) 37.5-25 MG tablet TAKE ONE TABLET BY MOUTH BEFORE BREAKFAST  ? zolpidem (AMBIEN) 10 MG tablet TAKE 1 TABLET(10 MG) BY MOUTH AT BEDTIME AS NEEDED FOR SLEEP  ? ?No facility-administered encounter medications on file as of 12/03/2021.  ? ? ?Allergies (verified) ?Codeine and Ace inhibitors  ? ?History: ?Past Medical History:  ?Diagnosis Date  ? Achilles tendinitis   ? Left leg  ? Borderline diabetes mellitus   ? Childhood asthma   ? Degenerative disc disease, lumbar   ? rec against surgery  ? Erectile dysfunction   ? Mitchell bleed 12/2015  ? hospitalization  ? Gout   ? Hypertension   ? Ileus ([place]) 12/2015  ? hospitalization   ? Male hypogonadism   ? followed by urology on T injections Aaron Mitchell)  ? Seasonal allergies   ? pollen  ? Severe obesity (BMI >= 40) (HCC)   ? Shoulder bursitis   ? Sleep apnea   ? CPAP  ? ?Past Surgical History:  ?Procedure Laterality Date  ? ADENOIDECTOMY    ? CARPAL TUNNEL RELEASE Right 2015  ? CATARACT EXTRACTION W/PHACO Right 07/15/2016  ? Procedure: CATARACT EXTRACTION PHACO AND INTRAOCULAR LENS PLACEMENT (IOC);  Surgeon: Aaron Koyanagi, MD;  Location: [facility];  Service: Ophthalmology;  Laterality: Right;  TORIC ?sleep apnea  ? CATARACT EXTRACTION W/PHACO Left 08/05/2016  ? Procedure: CATARACT EXTRACTION PHACO AND INTRAOCULAR LENS PLACEMENT (IOC) toric;  Surgeon: Aaron Koyanagi, MD;  Location: [facility];  Service: Ophthalmology;  Laterality: Left;  left ?sleep apnea ?toric  ? COLONOSCOPY  10/2012  ? rpt 5 yrs (Aaron Mitchell)  ? COLONOSCOPY WITH PROPOFOL N/A 03/20/2021  ? mult TAs, rpt 5 yrs Aaron Mitchell, Darren, MD)  ? EYE SURGERY    ? Lens replacement 07/12/2016,08/05/2016  ? LUMBAR EPIDURAL INJECTION Bilateral 04/2017, 12/2017  ? S1 transforaminal ESI (Aaron Mitchell)  ? PACEMAKER LEADLESS INSERTION N/A  09/04/2021  ? Procedure: PACEMAKER LEADLESS INSERTION;  Surgeon: Aaron Cowman, MD;  Location: [facility];  Service: Cardiovascular;  Laterality: N/A;  ? RHINOPLASTY    ? TONSILLECTOMY  ?1954  ? TRIGGER FINGER RELEASE Right 2015  ? 4th  ? ?Family History  ?Problem Relation Age of Onset  ? Alcohol abuse Father   ? Hypertension Father   ? AVM Father 79  ?     hemorrhagic stroke  ? Diabetes Paternal Grandmother   ? Cancer Neg Hx   ? CAD Neg Hx   ? Prostate cancer Neg Hx   ? Bladder Cancer Neg Hx   ? Kidney cancer Neg Hx   ? ?Social History  ? ?Socioeconomic History  ? Marital status: Married  ?  Spouse name: Not on file  ? Number of children: Not on file  ? Years of education: Not on file  ?  Highest education level: Not on file  ?Occupational History  ? Not on file  ?Tobacco Use  ? Smoking status: Former  ?  Types: Cigarettes  ?  Quit date: 07/27/1978  ?  Years since quitting: 43.3  ? Smokeless tobacco: Never  ? Tobacco comments:  ?  Quit in November 04, 1978  ?Vaping Use  ? Vaping Use: Never used  ?Substance and Sexual Activity  ? Alcohol use: Yes  ?  Alcohol/week: 5.0 standard drinks  ?  Types: 5 Shots of liquor per week  ?  Comment:    ? Drug use: No  ? Sexual activity: Not on file  ?Other Topics Concern  ? Not on file  ?Social History Narrative  ? Lives at home with wife [name]), dog passed away 11/04/15  ? 1 grown child  ? Occ: retired Clinical biochemist, was Chief Technology Officer   ? Edu: 2 yrs college  ? Activity: no regular exercise  ? Diet: good water, fruits daily, splenda iced tea  ? ?Social Determinants of Health  ? ?Financial Resource Strain: Not on file  ?Food Insecurity: Not on file  ?Transportation Needs: Not on file  ?Physical Activity: Not on file  ?Stress: Not on file  ?Social Connections: Not on file  ? ? ?Tobacco Counseling ?Counseling given: Not Answered ?Tobacco comments: Quit in Nov 04, 1978 ? ? ?Clinical Intake: ? ?Pre-visit preparation completed: Yes ? ?Pain : No/denies pain ? ?  ? ?Nutritional Risks:  None ?Diabetes: Yes ?CBG done?: No ?Did pt. bring in CBG monitor from home?: No ? ?How often do you need to have someone help you when you read instructions, pamphlets, or other written materials from your doctor

## 2021-12-03 NOTE — Patient Instructions (Signed)
Mr. Trahan , ?Thank you for taking time to come for your Medicare Wellness Visit. I appreciate your ongoing commitment to your health goals. Please review the following plan we discussed and let me know if I can assist you in the future.  ? ?Screening recommendations/referrals: ?Colonoscopy: 03/20/21 ?Recommended yearly ophthalmology/optometry visit for glaucoma screening and checkup ?Recommended yearly dental visit for hygiene and checkup ? ?Vaccinations: ?Influenza vaccine: 05/06/21 ?Pneumococcal vaccine: 03/20/19 ?Tdap vaccine: 08/23/14 ?Shingles vaccine: Shingrix 03/14/18, 05/16/18   ?Covid-19: 09/08/19, 10/06/19, 05/20/20, 12/13/20, 05/01/21 ? ?Advanced directives: no ? ?Conditions/risks identified: none ? ?Next appointment: Follow up in one year for your annual wellness visit. - 12/07/22 @ 2:15pm by phone ? ?Preventive Care 74 Years and Older, Male ?Preventive care refers to lifestyle choices and visits with your health care provider that can promote health and wellness. ?What does preventive care include? ?A yearly physical exam. This is also called an annual well check. ?Dental exams once or twice a year. ?Routine eye exams. Ask your health care provider how often you should have your eyes checked. ?Personal lifestyle choices, including: ?Daily care of your teeth and gums. ?Regular physical activity. ?Eating a healthy diet. ?Avoiding tobacco and drug use. ?Limiting alcohol use. ?Practicing safe sex. ?Taking low doses of aspirin every day. ?Taking vitamin and mineral supplements as recommended by your health care provider. ?What happens during an annual well check? ?The services and screenings done by your health care provider during your annual well check will depend on your age, overall health, lifestyle risk factors, and family history of disease. ?Counseling  ?Your health care provider may ask you questions about your: ?Alcohol use. ?Tobacco use. ?Drug use. ?Emotional well-being. ?Home and relationship  well-being. ?Sexual activity. ?Eating habits. ?History of falls. ?Memory and ability to understand (cognition). ?Work and work Statistician. ?Screening  ?You may have the following tests or measurements: ?Height, weight, and BMI. ?Blood pressure. ?Lipid and cholesterol levels. These may be checked every 5 years, or more frequently if you are over 5 years old. ?Skin check. ?Lung cancer screening. You may have this screening every year starting at age 74 if you have a 30-pack-year history of smoking and currently smoke or have quit within the past 15 years. ?Fecal occult blood test (FOBT) of the stool. You may have this test every year starting at age 74. ?Flexible sigmoidoscopy or colonoscopy. You may have a sigmoidoscopy every 5 years or a colonoscopy every 10 years starting at age 74. ?Prostate cancer screening. Recommendations will vary depending on your family history and other risks. ?Hepatitis C blood test. ?Hepatitis B blood test. ?Sexually transmitted disease (STD) testing. ?Diabetes screening. This is done by checking your blood sugar (glucose) after you have not eaten for a while (fasting). You may have this done every 1-3 years. ?Abdominal aortic aneurysm (AAA) screening. You may need this if you are a current or former smoker. ?Osteoporosis. You may be screened starting at age 74. if you are at high risk. ?Talk with your health care provider about your test results, treatment options, and if necessary, the need for more tests. ?Vaccines  ?Your health care provider may recommend certain vaccines, such as: ?Influenza vaccine. This is recommended every year. ?Tetanus, diphtheria, and acellular pertussis (Tdap, Td) vaccine. You may need a Td booster every 10 years. ?Zoster vaccine. You may need this after age 74. ?Pneumococcal 13-valent conjugate (PCV13) vaccine. One dose is recommended after age 74. ?Pneumococcal polysaccharide (PPSV23) vaccine. One dose is recommended after age 74. ?Talk  to your health care  provider about which screenings and vaccines you need and how often you need them. ?This information is not intended to replace advice given to you by your health care provider. Make sure you discuss any questions you have with your health care provider. ?Document Released: 08/09/2015 Document Revised: 04/01/2016 Document Reviewed: 05/14/2015 ?Elsevier Interactive Patient Education ? 2017 [place]. ? ?Fall Prevention in the Home ?Falls can cause injuries. They can happen to people of all ages. There are many things you can do to make your home safe and to help prevent falls. ?What can I do on the outside of my home? ?Regularly fix the edges of walkways and driveways and fix any cracks. ?Remove anything that might make you trip as you walk through a door, such as a raised step or threshold. ?Trim any bushes or trees on the path to your home. ?Use bright outdoor lighting. ?Clear any walking paths of anything that might make someone trip, such as rocks or tools. ?Regularly check to see if handrails are loose or broken. Make sure that both sides of any steps have handrails. ?Any raised decks and porches should have guardrails on the edges. ?Have any leaves, snow, or ice cleared regularly. ?Use sand or salt on walking paths during winter. ?Clean up any spills in your garage right away. This includes oil or grease spills. ?What can I do in the bathroom? ?Use night lights. ?Install grab bars by the toilet and in the tub and shower. Do not use towel bars as grab bars. ?Use non-skid mats or decals in the tub or shower. ?If you need to sit down in the shower, use a plastic, non-slip stool. ?Keep the floor dry. Clean up any water that spills on the floor as soon as it happens. ?Remove soap buildup in the tub or shower regularly. ?Attach bath mats securely with double-sided non-slip rug tape. ?Do not have throw rugs and other things on the floor that can make you trip. ?What can I do in the bedroom? ?Use night lights. ?Make  sure that you have a light by your bed that is easy to reach. ?Do not use any sheets or blankets that are too big for your bed. They should not hang down onto the floor. ?Have a firm chair that has side arms. You can use this for support while you get dressed. ?Do not have throw rugs and other things on the floor that can make you trip. ?What can I do in the kitchen? ?Clean up any spills right away. ?Avoid walking on wet floors. ?Keep items that you use a lot in easy-to-reach places. ?If you need to reach something above you, use a strong step stool that has a grab bar. ?Keep electrical cords out of the way. ?Do not use floor polish or wax that makes floors slippery. If you must use wax, use non-skid floor wax. ?Do not have throw rugs and other things on the floor that can make you trip. ?What can I do with my stairs? ?Do not leave any items on the stairs. ?Make sure that there are handrails on both sides of the stairs and use them. Fix handrails that are broken or loose. Make sure that handrails are as long as the stairways. ?Check any carpeting to make sure that it is firmly attached to the stairs. Fix any carpet that is loose or worn. ?Avoid having throw rugs at the top or bottom of the stairs. If you do have throw rugs,  attach them to the floor with carpet tape. ?Make sure that you have a light switch at the top of the stairs and the bottom of the stairs. If you do not have them, ask someone to add them for you. ?What else can I do to help prevent falls? ?Wear shoes that: ?Do not have high heels. ?Have rubber bottoms. ?Are comfortable and fit you well. ?Are closed at the toe. Do not wear sandals. ?If you use a stepladder: ?Make sure that it is fully opened. Do not climb a closed stepladder. ?Make sure that both sides of the stepladder are locked into place. ?Ask someone to hold it for you, if possible. ?Clearly mark and make sure that you can see: ?Any grab bars or handrails. ?First and last steps. ?Where the  edge of each step is. ?Use tools that help you move around (mobility aids) if they are needed. These include: ?Canes. ?Walkers. ?Scooters. ?Crutches. ?Turn on the lights when you go into a dark area. Replace

## 2021-12-23 DIAGNOSIS — G4733 Obstructive sleep apnea (adult) (pediatric): Secondary | ICD-10-CM | POA: Diagnosis not present

## 2022-01-13 DIAGNOSIS — I495 Sick sinus syndrome: Secondary | ICD-10-CM | POA: Diagnosis not present

## 2022-01-22 ENCOUNTER — Other Ambulatory Visit: Payer: Self-pay | Admitting: Family Medicine

## 2022-01-23 DIAGNOSIS — G4733 Obstructive sleep apnea (adult) (pediatric): Secondary | ICD-10-CM | POA: Diagnosis not present

## 2022-01-26 ENCOUNTER — Telehealth: Payer: Self-pay

## 2022-01-26 NOTE — Progress Notes (Signed)
Chronic Care Management Pharmacy Assistant   Name: Shakeem Stern  MRN: [id number] DOB: [birth year]  Reason for Encounter: Medication Adherence and Delivery Coordination    Recent office visits:  12/03/21-Lorrie Barnes,LPN(PCP) Telemedicine  [facility]-discussed screenings, vaccines, no medicine changes. 10/30/21-Spencer [name],MD(fam med)-depression, start fluoxetine '20mg'$  take 1 tablet daily.   Recent consult visits:  11/03/21-Blake Maxwell(ophth)- no data found  Hospital visits:  None in previous 6 months  Medications: Outpatient Encounter Medications as of 01/26/2022  Medication Sig   acetaminophen (TYLENOL 8 HOUR ARTHRITIS PAIN) 650 MG CR tablet Take 2 tablets (1,300 mg total) by mouth 2 (two) times daily as needed for pain.   albuterol (PROVENTIL) (2.5 MG/3ML) 0.083% nebulizer solution Take 3 mLs (2.5 mg total) by nebulization every 6 (six) hours as needed for wheezing or shortness of breath.   albuterol (VENTOLIN HFA) 108 (90 Base) MCG/ACT inhaler Inhale 2 puffs into the lungs every 6 (six) hours as needed for wheezing or shortness of breath.   allopurinol (ZYLOPRIM) 300 MG tablet TAKE ONE TABLET BY MOUTH BEFORE BREAKFAST Needs to schedule annual physcial for further refills   amLODipine (NORVASC) 5 MG tablet TAKE ONE TABLET BY MOUTH BEFORE BREAKFAST Needs to schedule annual physcial for further refills   apixaban (ELIQUIS) 5 MG TABS tablet Take 5 mg by mouth 2 (two) times daily.   atorvastatin (LIPITOR) 40 MG tablet TAKE ONE TABLET BY MOUTH BEFORE BREAKFAST Needs to schedule annual physcial for further refills   diclofenac sodium (VOLTAREN) 1 % GEL Apply 2 g topically daily as needed (pain). Apply to both hands.   FLUoxetine (PROZAC) 20 MG capsule Take 1 capsule (20 mg total) by mouth daily.   fluticasone (FLONASE) 50 MCG/ACT nasal spray instill TWO SPRAYS in each nostril daily Needs to schedule annual physical for further refills   furosemide (LASIX) 40 MG tablet TAKE ONE TABLET BY  MOUTH ONCE DAILY AS NEEDED FOR EDEMA   metaxalone (SKELAXIN) 800 MG tablet Take 1 tablet (800 mg total) by mouth 3 (three) times daily as needed for muscle spasms.   Multiple Vitamins tablet Take 1 tablet by mouth daily.   sildenafil (VIAGRA) 100 MG tablet Take 100 mg by mouth as needed for erectile dysfunction.   tadalafil (CIALIS) 20 MG tablet Take 20 mg by mouth daily as needed for erectile dysfunction.   testosterone cypionate (DEPOTESTOSTERONE CYPIONATE) 200 MG/ML injection Inject 0.4 mLs (80 mg total) into the muscle once a week.   tiotropium (SPIRIVA HANDIHALER) 18 MCG inhalation capsule Place 1 capsule (18 mcg total) into inhaler and inhale daily.   traMADol (ULTRAM) 50 MG tablet Take 1 tablet (50 mg total) by mouth every 12 (twelve) hours as needed for moderate pain.   triamterene-hydrochlorothiazide (MAXZIDE-25) 37.5-25 MG tablet TAKE ONE TABLET BY MOUTH BEFORE BREAKFAST Needs to schedule annual physical for further refills   zolpidem (AMBIEN) 10 MG tablet TAKE 1 TABLET(10 MG) BY MOUTH AT BEDTIME AS NEEDED FOR SLEEP   No facility-administered encounter medications on file as of 01/26/2022.   BP Readings from Last 3 Encounters:  10/30/21 132/80  10/08/21 135/85  09/05/21 (!) 146/78    Lab Results  Component Value Date   HGBA1C 6.7 (A) 08/13/2021      Recent OV, Consult or Hospital visit:  Recent medication changes indicated:  Family Medicine-started fluoxetine '20mg'$     Last adherence delivery date:11/06/21      Patient is due for next adherence delivery on: 02/05/22  Spoke with patient on 01/26/22 reviewed medications  and coordinated delivery.  This delivery to include: Adherence Packaging  90 Days  Packs: Triamterene 37.5 mg-HCTZ 25 mg 1 tablet  before breakfast Allopurinol 300 mg 1 tablet before breakfast  Amlodipine 5 mg 1 tablet before breakfast  Atorvastatin 40 mg 1 tablet before breakfast   VIAL medications: Fluticasone 34mg/act 2 sprays each nostril daily as  needed   Patient declined the following medications this month: Eliquis  '5mg'$   take 1 tablet breakfast 1 tablet evening meal (wants in vial and has supply on hand) Furosemide 40 mg - patient takes as needed  Testosterone '200mg'$ /ml - due in September  Any concerns about your medications? No  How often do you forget or accidentally miss a dose? Never  Do you use a pillbox? No  Is patient in packaging Yes  What is the date on your next pill pack? Not near to read date   Any concerns or issues with your packaging? Patient is satisfied   Refills requested from providers include: fluticasone,atorvastatin,amlodipine,allopurinol,triamterene  Confirmed delivery date of 02/05/22, advised patient that pharmacy will contact them the morning of delivery.  Recent blood pressure readings are as follows:none available    Recent blood glucose readings are as follows:New A1c 6.7  08/13/21   Annual wellness visit in last year? Yes Most Recent BP reading:132/80  81-P  10/30/21   Cycle dispensing form sent to  [facility] [phone]

## 2022-01-28 ENCOUNTER — Telehealth: Payer: Self-pay

## 2022-01-28 NOTE — Chronic Care Management (AMB) (Signed)
Spoke with the patient again to  discuss not needing eliquis with an upcoming delivery from [facility]. The patient states he sometimes only uses 1 a day due to bruising and this is ok with the PCP. This means extra pills on hand. The patient will need a refill before the next 90ds gets shipped out so he will call when he only has a few pills on hand.  Charlene Brooke, CPP notified  Avel Sensor, [place]  [phone]

## 2022-02-02 NOTE — Telephone Encounter (Signed)
Spoke with patient regarding possible brusing with Eliquis. Pt reports this is not a significant issue and is simply "old skin". He sometimes misses doses of Eliquis just due to forgetting or "being lazy". Discussed purpose of Eliquis and importance of BID dosing for efficacy, pt affirms he is fully aware of indication/importance of Eliquis and will try to take BID as prescribed.

## 2022-02-22 ENCOUNTER — Other Ambulatory Visit: Payer: Self-pay | Admitting: Family Medicine

## 2022-02-22 DIAGNOSIS — E1169 Type 2 diabetes mellitus with other specified complication: Secondary | ICD-10-CM

## 2022-02-22 DIAGNOSIS — M1A00X0 Idiopathic chronic gout, unspecified site, without tophus (tophi): Secondary | ICD-10-CM

## 2022-02-22 DIAGNOSIS — G4733 Obstructive sleep apnea (adult) (pediatric): Secondary | ICD-10-CM | POA: Diagnosis not present

## 2022-02-22 DIAGNOSIS — Z125 Encounter for screening for malignant neoplasm of prostate: Secondary | ICD-10-CM

## 2022-02-23 ENCOUNTER — Other Ambulatory Visit (INDEPENDENT_AMBULATORY_CARE_PROVIDER_SITE_OTHER): Payer: PPO

## 2022-02-23 DIAGNOSIS — M1A00X0 Idiopathic chronic gout, unspecified site, without tophus (tophi): Secondary | ICD-10-CM

## 2022-02-23 DIAGNOSIS — E1169 Type 2 diabetes mellitus with other specified complication: Secondary | ICD-10-CM | POA: Diagnosis not present

## 2022-02-23 DIAGNOSIS — E785 Hyperlipidemia, unspecified: Secondary | ICD-10-CM | POA: Diagnosis not present

## 2022-02-23 LAB — COMPREHENSIVE METABOLIC PANEL
ALT: 14 U/L (ref 0–53)
AST: 20 U/L (ref 0–37)
Albumin: 4 g/dL (ref 3.5–5.2)
Alkaline Phosphatase: 94 U/L (ref 39–117)
BUN: 19 mg/dL (ref 6–23)
CO2: 31 mEq/L (ref 19–32)
Calcium: 8.9 mg/dL (ref 8.4–10.5)
Chloride: 98 mEq/L (ref 96–112)
Creatinine, Ser: 1.22 mg/dL (ref 0.40–1.50)
GFR: 58.65 mL/min — ABNORMAL LOW (ref >60.00)
Glucose, Bld: 125 mg/dL — ABNORMAL HIGH (ref 70–99)
Potassium: 3.5 mEq/L (ref 3.5–5.1)
Sodium: 138 mEq/L (ref 135–145)
Total Bilirubin: 1.3 mg/dL — ABNORMAL HIGH (ref 0.2–1.2)
Total Protein: 6.5 g/dL (ref 6.0–8.3)

## 2022-02-23 LAB — LIPID PANEL
Cholesterol: 93 mg/dL (ref 0–200)
HDL: 41.7 mg/dL (ref >39.00)
LDL Cholesterol: 35 mg/dL (ref 0–99)
NonHDL: 51.64
Total CHOL/HDL Ratio: 2
Triglycerides: 83 mg/dL (ref 0.0–149.0)
VLDL: 16.6 mg/dL (ref 0.0–40.0)

## 2022-02-23 LAB — HEMOGLOBIN A1C: Hgb A1c MFr Bld: 6.5 % (ref 4.6–6.5)

## 2022-02-23 LAB — URIC ACID: Uric Acid, Serum: 6 mg/dL (ref 4.0–7.8)

## 2022-02-24 LAB — MICROALBUMIN / CREATININE URINE RATIO
Creatinine,U: 29.4 mg/dL
Microalb Creat Ratio: 5.3 mg/g (ref 0.0–30.0)
Microalb, Ur: 1.5 mg/dL (ref 0.0–1.9)

## 2022-02-26 DIAGNOSIS — M5416 Radiculopathy, lumbar region: Secondary | ICD-10-CM | POA: Diagnosis not present

## 2022-02-26 DIAGNOSIS — M5126 Other intervertebral disc displacement, lumbar region: Secondary | ICD-10-CM | POA: Diagnosis not present

## 2022-02-27 ENCOUNTER — Encounter: Payer: Self-pay | Admitting: Family Medicine

## 2022-03-02 ENCOUNTER — Encounter: Payer: PPO | Admitting: Family Medicine

## 2022-03-04 ENCOUNTER — Ambulatory Visit: Payer: PPO | Admitting: Family Medicine

## 2022-03-04 ENCOUNTER — Ambulatory Visit (INDEPENDENT_AMBULATORY_CARE_PROVIDER_SITE_OTHER): Payer: PPO | Admitting: Family Medicine

## 2022-03-04 ENCOUNTER — Encounter: Payer: PPO | Admitting: Family Medicine

## 2022-03-04 ENCOUNTER — Encounter: Payer: Self-pay | Admitting: Family Medicine

## 2022-03-04 VITALS — BP 128/76 | HR 84 | Temp 97.7°F | Ht 68.5 in | Wt 244.0 lb

## 2022-03-04 DIAGNOSIS — I340 Nonrheumatic mitral (valve) insufficiency: Secondary | ICD-10-CM | POA: Diagnosis not present

## 2022-03-04 DIAGNOSIS — Z7189 Other specified counseling: Secondary | ICD-10-CM | POA: Diagnosis not present

## 2022-03-04 DIAGNOSIS — I495 Sick sinus syndrome: Secondary | ICD-10-CM | POA: Diagnosis not present

## 2022-03-04 DIAGNOSIS — E1169 Type 2 diabetes mellitus with other specified complication: Secondary | ICD-10-CM

## 2022-03-04 DIAGNOSIS — G629 Polyneuropathy, unspecified: Secondary | ICD-10-CM

## 2022-03-04 DIAGNOSIS — I10 Essential (primary) hypertension: Secondary | ICD-10-CM

## 2022-03-04 DIAGNOSIS — F321 Major depressive disorder, single episode, moderate: Secondary | ICD-10-CM

## 2022-03-04 DIAGNOSIS — Z950 Presence of cardiac pacemaker: Secondary | ICD-10-CM

## 2022-03-04 DIAGNOSIS — Z0000 Encounter for general adult medical examination without abnormal findings: Secondary | ICD-10-CM | POA: Diagnosis not present

## 2022-03-04 DIAGNOSIS — E113299 Type 2 diabetes mellitus with mild nonproliferative diabetic retinopathy without macular edema, unspecified eye: Secondary | ICD-10-CM

## 2022-03-04 DIAGNOSIS — I482 Chronic atrial fibrillation, unspecified: Secondary | ICD-10-CM | POA: Diagnosis not present

## 2022-03-04 DIAGNOSIS — M1A9XX0 Chronic gout, unspecified, without tophus (tophi): Secondary | ICD-10-CM

## 2022-03-04 DIAGNOSIS — J449 Chronic obstructive pulmonary disease, unspecified: Secondary | ICD-10-CM | POA: Diagnosis not present

## 2022-03-04 DIAGNOSIS — E785 Hyperlipidemia, unspecified: Secondary | ICD-10-CM

## 2022-03-04 DIAGNOSIS — M1A00X0 Idiopathic chronic gout, unspecified site, without tophus (tophi): Secondary | ICD-10-CM | POA: Diagnosis not present

## 2022-03-04 DIAGNOSIS — E291 Testicular hypofunction: Secondary | ICD-10-CM

## 2022-03-04 DIAGNOSIS — G4733 Obstructive sleep apnea (adult) (pediatric): Secondary | ICD-10-CM | POA: Diagnosis not present

## 2022-03-04 MED ORDER — FLUOXETINE HCL 40 MG PO CAPS: 40.0000 mg | ORAL_CAPSULE | Freq: Every day | ORAL | 3 refills | Status: DC

## 2022-03-04 MED ORDER — ALLOPURINOL 300 MG PO TABS
300.0000 mg | ORAL_TABLET | Freq: Every day | ORAL | 3 refills | Status: DC
Start: 2022-03-04 — End: 2022-04-24

## 2022-03-04 MED ORDER — TRIAMTERENE-HCTZ 37.5-25 MG PO TABS: ORAL_TABLET | ORAL | 3 refills | Status: DC

## 2022-03-04 MED ORDER — FLUTICASONE PROPIONATE 50 MCG/ACT NA SUSP: NASAL | 0 refills | Status: DC

## 2022-03-04 MED ORDER — ALPHA-LIPOIC ACID 600 MG PO CAPS: 3.0000 | ORAL_CAPSULE | Freq: Every day | ORAL | Status: DC

## 2022-03-04 MED ORDER — FUROSEMIDE 40 MG PO TABS: 40.0000 mg | ORAL_TABLET | Freq: Every day | ORAL | 3 refills | Status: DC

## 2022-03-04 MED ORDER — ATORVASTATIN CALCIUM 40 MG PO TABS
ORAL_TABLET | ORAL | 3 refills | Status: DC
Start: 2022-03-04 — End: 2022-12-02

## 2022-03-04 MED ORDER — POTASSIUM 99 MG PO TABS: 1.0000 | ORAL_TABLET | Freq: Every day | ORAL | Status: DC

## 2022-03-04 MED ORDER — SPIRIVA HANDIHALER 18 MCG IN CAPS
18.0000 ug | ORAL_CAPSULE | Freq: Every day | RESPIRATORY_TRACT | 3 refills | Status: AC
Start: 2022-03-04 — End: ?

## 2022-03-04 MED ORDER — AMLODIPINE BESYLATE 5 MG PO TABS: 5.0000 mg | ORAL_TABLET | Freq: Every day | ORAL | 0 refills | Status: DC

## 2022-03-04 NOTE — Progress Notes (Unsigned)
Patient ID: Aaron Mitchell, male    DOB: Aug 11, 1945, 75 y.o.   MRN: [id number]  This visit was conducted in person.  BP 128/76   Pulse 84   Temp 97.7 F (36.5 C) (Temporal)   Ht 5' 8.5" (1.74 m)   Wt 244 lb (110.7 kg)   SpO2 98%   BMI 36.56 kg/m    CC: CPE Subjective:   HPI: Aaron Mitchell is a 76 y.o. male presenting on 03/04/2022 for Annual Exam (MCR prt 2. )   Saw health advisor 11/2021 for medicare wellness visit. Note reviewed.      No data to display        Pt declined cognitive testing.   No results found.  Flowsheet Row Clinical Support from 12/03/2021 in [facility] [place]  PHQ-2 Total Score 2          12/03/2021    2:45 PM 10/09/2020   11:11 AM 07/25/2019   11:17 AM 07/05/2018   10:16 AM 07/01/2017    8:56 AM  Fall Risk   Falls in the past year? 1 0 1 0 No  Comment   tripped and fell    Number falls in past yr: 0 0 0    Injury with Fall? 0 0 0    Risk for fall due to : History of fall(s) Medication side effect Medication side effect    Follow up Falls prevention discussed Falls evaluation completed;Falls prevention discussed Falls evaluation completed;Falls prevention discussed     Sees urology for hypogonadism on TRT.  Weight gain noted recently, but he's still maintaining weight loss from last year with portion control.   Pacemaker placed 08/2021.   Notes worsening depression - saw Dr Lorelei Pont, placed on prozac '20mg'$  daily which he's tolerating well. Initial improvement but now notes depression recurring and desires trial of higher dose.  OSA - well controlled with CPAP 10cmH2O.   Preventative: COLONOSCOPY 10/2012 rpt 5 yrs ([facility])  Cologuard 01/2018 WNL.  COLONOSCOPY WITH PROPOFOL 03/20/2021 - mult TAs, rpt 5 yrs Allen Norris, Darren, MD)  Prostate cancer screening - followed regularly by urologist Diamantina [facility])  Lung cancer screening - ex smoker quit 1980  Flu shot yearly COVID vaccine Moderna 08/2019, 20-Oct-2019, booster 04/2020, 11/2020,  bivalent 04/2021 Td 20-Oct-2014  Prevnar-13 Oct 20, 2014, pneumovax 20-Oct-2015  zostavax 10-19-13 shingrix - 02/2018, 04/2018  Advanced directive - scanned and in chart 08/2018. Would want wife [name] then [name] to be HCPOA. Does not want prolonged life support if terminal condition. Ok with feeding tube short term.  Seat belt use discussed  Sunscreen use and skin screen discussed  Sleep - averaging 8-9 hours/night Ex smoker quit remotely 1980. 2.5 ppd x 15 yrs Alcohol - 1-2 mixed drinks a few times a week Dentist q6 mo  Eye exam yearly  Bowel - no constipation  Bladder - no incontinence   Lives at home with wife [name]), dog passed away October 20, 2015 1 grown child Occ: retired Clinical biochemist, was Chief Technology Officer  Edu: 2 yrs college Activity: no regular exercise  Diet: good water, fruits daily, splenda, iced tea and coffee      Relevant past medical, surgical, family and social history reviewed and updated as indicated. Interim medical history since our last visit reviewed. Allergies and medications reviewed and updated. Outpatient Medications Prior to Visit  Medication Sig Dispense Refill   acetaminophen (TYLENOL 8 HOUR ARTHRITIS PAIN) 650 MG CR tablet Take 2 tablets (1,300 mg total) by mouth 2 (two) times  daily as needed for pain.     albuterol (PROVENTIL) (2.5 MG/3ML) 0.083% nebulizer solution Take 3 mLs (2.5 mg total) by nebulization every 6 (six) hours as needed for wheezing or shortness of breath. 90 mL 3   albuterol (VENTOLIN HFA) 108 (90 Base) MCG/ACT inhaler Inhale 2 puffs into the lungs every 6 (six) hours as needed for wheezing or shortness of breath. 3 Inhaler 3   allopurinol (ZYLOPRIM) 300 MG tablet TAKE ONE TABLET BY MOUTH BEFORE BREAKFAST Needs to schedule annual physcial for further refills 90 tablet 0   amLODipine (NORVASC) 5 MG tablet TAKE ONE TABLET BY MOUTH BEFORE BREAKFAST Needs to schedule annual physcial for further refills 90 tablet 0   apixaban (ELIQUIS) 5 MG TABS tablet Take 5 mg by mouth 2  (two) times daily.     atorvastatin (LIPITOR) 40 MG tablet TAKE ONE TABLET BY MOUTH BEFORE BREAKFAST Needs to schedule annual physcial for further refills 90 tablet 0   diclofenac sodium (VOLTAREN) 1 % GEL Apply 2 g topically daily as needed (pain). Apply to both hands.     FLUoxetine (PROZAC) 20 MG capsule Take 1 capsule (20 mg total) by mouth daily. 90 capsule 1   fluticasone (FLONASE) 50 MCG/ACT nasal spray instill TWO SPRAYS in each nostril daily Needs to schedule annual physical for further refills 48 g 0   furosemide (LASIX) 40 MG tablet TAKE ONE TABLET BY MOUTH ONCE DAILY AS NEEDED FOR EDEMA 30 tablet 5   metaxalone (SKELAXIN) 800 MG tablet Take 1 tablet (800 mg total) by mouth 3 (three) times daily as needed for muscle spasms. 90 tablet 0   Multiple Vitamins tablet Take 1 tablet by mouth daily.     sildenafil (VIAGRA) 100 MG tablet Take 100 mg by mouth as needed for erectile dysfunction.     tadalafil (CIALIS) 20 MG tablet Take 20 mg by mouth daily as needed for erectile dysfunction.     testosterone cypionate (DEPOTESTOSTERONE CYPIONATE) 200 MG/ML injection Inject 0.4 mLs (80 mg total) into the muscle once a week. 10 mL 0   tiotropium (SPIRIVA HANDIHALER) 18 MCG inhalation capsule Place 1 capsule (18 mcg total) into inhaler and inhale daily. 90 capsule 3   traMADol (ULTRAM) 50 MG tablet Take 1 tablet (50 mg total) by mouth every 12 (twelve) hours as needed for moderate pain. 30 tablet 0   triamterene-hydrochlorothiazide (MAXZIDE-25) 37.5-25 MG tablet TAKE ONE TABLET BY MOUTH BEFORE BREAKFAST Needs to schedule annual physical for further refills 90 tablet 0   zolpidem (AMBIEN) 10 MG tablet TAKE 1 TABLET(10 MG) BY MOUTH AT BEDTIME AS NEEDED FOR SLEEP 30 tablet 0   No facility-administered medications prior to visit.     Per HPI unless specifically indicated in ROS section below Review of Systems  Constitutional:  Negative for activity change, appetite change, chills, fatigue, fever and  unexpected weight change.  HENT:  Negative for hearing loss.   Eyes:  Negative for visual disturbance.  Respiratory:  Negative for cough, chest tightness, shortness of breath and wheezing.   Cardiovascular:  Positive for leg swelling (occ). Negative for chest pain and palpitations.  Gastrointestinal:  Negative for abdominal distention, abdominal pain, blood in stool, constipation, diarrhea, nausea and vomiting.  Genitourinary:  Negative for difficulty urinating and hematuria.  Musculoskeletal:  Negative for arthralgias, myalgias and neck pain.  Skin:  Negative for rash.  Neurological:  Negative for dizziness, seizures, syncope and headaches.  Hematological:  Negative for adenopathy. Does not bruise/bleed easily.  Psychiatric/Behavioral:  Positive for dysphoric mood. The patient is not nervous/anxious.     Objective:  BP 128/76   Pulse 84   Temp 97.7 F (36.5 C) (Temporal)   Ht 5' 8.5" (1.74 m)   Wt 244 lb (110.7 kg)   SpO2 98%   BMI 36.56 kg/m   Wt Readings from Last 3 Encounters:  03/04/22 244 lb (110.7 kg)  12/03/21 238 lb (108 kg)  10/30/21 238 lb 8 oz (108.2 kg)      Physical Exam Vitals and nursing note reviewed.  Constitutional:      General: He is not in acute distress.    Appearance: Normal appearance. He is well-developed. He is not ill-appearing.  HENT:     Head: Normocephalic and atraumatic.     Right Ear: Hearing, tympanic membrane, ear canal and external ear normal.     Left Ear: Hearing, tympanic membrane, ear canal and external ear normal.  Eyes:     General: No scleral icterus.    Extraocular Movements: Extraocular movements intact.     Conjunctiva/sclera: Conjunctivae normal.     Pupils: Pupils are equal, round, and reactive to light.  Neck:     Thyroid: No thyroid mass or thyromegaly.     Vascular: No carotid bruit.  Cardiovascular:     Rate and Rhythm: Normal rate and regular rhythm.     Pulses: Normal pulses.          Radial pulses are 2+ on the  right side and 2+ on the left side.     Heart sounds: Murmur (3/6 L sternal border) heard.  Pulmonary:     Effort: Pulmonary effort is normal. No respiratory distress.     Breath sounds: Normal breath sounds. No wheezing, rhonchi or rales.  Abdominal:     General: Bowel sounds are normal. There is no distension.     Palpations: Abdomen is soft. There is no mass.     Tenderness: There is no abdominal tenderness. There is no guarding or rebound.     Hernia: No hernia is present.  Musculoskeletal:        General: Normal range of motion.     Cervical back: Normal range of motion and neck supple.     Right lower leg: Edema (tr) present.     Left lower leg: Edema (tr) present.  Lymphadenopathy:     Cervical: No cervical adenopathy.  Skin:    General: Skin is warm and dry.     Findings: No rash.  Neurological:     General: No focal deficit present.     Mental Status: He is alert and oriented to person, place, and time.  Psychiatric:        Mood and Affect: Mood normal.        Behavior: Behavior normal.        Thought Content: Thought content normal.        Judgment: Judgment normal.       Results for orders placed or performed in visit on 02/23/22  Hemoglobin A1c  Result Value Ref Range   Hgb A1c MFr Bld 6.5 4.6 - 6.5 %  Microalbumin / creatinine urine ratio  Result Value Ref Range   Microalb, Ur 1.5 0.0 - 1.9 mg/dL   Creatinine,U 29.4 mg/dL   Microalb Creat Ratio 5.3 0.0 - 30.0 mg/g  Uric acid  Result Value Ref Range   Uric Acid, Serum 6.0 4.0 - 7.8 mg/dL  Lipid panel  Result Value Ref Range  Cholesterol 93 0 - 200 mg/dL   Triglycerides 83.0 0.0 - 149.0 mg/dL   HDL 41.70 >39.00 mg/dL   VLDL 16.6 0.0 - 40.0 mg/dL   LDL Cholesterol 35 0 - 99 mg/dL   Total CHOL/HDL Ratio 2    NonHDL 51.64   Comprehensive metabolic panel  Result Value Ref Range   Sodium 138 135 - 145 mEq/L   Potassium 3.5 3.5 - 5.1 mEq/L   Chloride 98 96 - 112 mEq/L   CO2 31 19 - 32 mEq/L   Glucose,  Bld 125 (H) 70 - 99 mg/dL   BUN 19 6 - 23 mg/dL   Creatinine, Ser 1.22 0.40 - 1.50 mg/dL   Total Bilirubin 1.3 (H) 0.2 - 1.2 mg/dL   Alkaline Phosphatase 94 39 - 117 U/L   AST 20 0 - 37 U/L   ALT 14 0 - 53 U/L   Total Protein 6.5 6.0 - 8.3 g/dL   Albumin 4.0 3.5 - 5.2 g/dL   GFR 58.65 (L) >60.00 mL/min   Calcium 8.9 8.4 - 10.5 mg/dL      12/03/2021    2:41 PM 10/30/2021   11:11 AM 10/09/2020   11:11 AM 07/25/2019   11:18 AM 07/05/2018   10:16 AM  Depression screen PHQ 2/9  Decreased Interest 1 3 0 0 0  Down, Depressed, Hopeless 1 3 0 0 0  PHQ - 2 Score 2 6 0 0 0  Altered sleeping 1 2 0 0 0  Tired, decreased energy 1 3 0 0 0  Change in appetite 0  0 0 0  Feeling bad or failure about yourself  0 1 0 0 0  Trouble concentrating 0 0 0 0 0  Moving slowly or fidgety/restless 0 0 0 0 0  Suicidal thoughts 0 0 0 0 0  PHQ-9 Score 4 12 0 0 0  Difficult doing work/chores Not difficult at all Somewhat difficult Not difficult at all Not difficult at all Not difficult at all      10/30/2021   11:12 AM  GAD 7 : Generalized Anxiety Score  Nervous, Anxious, on Edge 1  Control/stop worrying 0  Worry too much - different things 0  Trouble relaxing 0  Restless 0  Easily annoyed or irritable 1  Afraid - awful might happen 0  Total GAD 7 Score 2  Anxiety Difficulty Somewhat difficult   Assessment & Plan:   Problem List Items Addressed This Visit   None    No orders of the defined types were placed in this encounter.  No orders of the defined types were placed in this encounter.   Patient instructions: Increase prozac to '40mg'$  daily. New dose at pharmacy. Let me know if any trouble with this.  Consider 1/2 tablet maxzide (triamterene/hctz).  You are doing well today Return as needed or in 1 year for next physical.   Follow up plan: No follow-ups on file.  [name], MD

## 2022-03-04 NOTE — Patient Instructions (Addendum)
Increase prozac to '40mg'$  daily. New dose at pharmacy. Let me know if any trouble with this.  Consider 1/2 tablet maxzide (triamterene/hctz).  You are doing well today Return as needed or in 1 year for next physical.   Health Maintenance After Age 74 After age 6, you are at a higher risk for certain long-term diseases and infections as well as injuries from falls. Falls are a major cause of broken bones and head injuries in people who are older than age 57. Getting regular preventive care can help to keep you healthy and well. Preventive care includes getting regular testing and making lifestyle changes as recommended by your health care provider. Talk with your health care provider about: Which screenings and tests you should have. A screening is a test that checks for a disease when you have no symptoms. A diet and exercise plan that is right for you. What should I know about screenings and tests to prevent falls? Screening and testing are the best ways to find a health problem early. Early diagnosis and treatment give you the best chance of managing medical conditions that are common after age 63. Certain conditions and lifestyle choices may make you more likely to have a fall. Your health care provider may recommend: Regular vision checks. Poor vision and conditions such as cataracts can make you more likely to have a fall. If you wear glasses, make sure to get your prescription updated if your vision changes. Medicine review. Work with your health care provider to regularly review all of the medicines you are taking, including over-the-counter medicines. Ask your health care provider about any side effects that may make you more likely to have a fall. Tell your health care provider if any medicines that you take make you feel dizzy or sleepy. Strength and balance checks. Your health care provider may recommend certain tests to check your strength and balance while standing, walking, or changing  positions. Foot health exam. Foot pain and numbness, as well as not wearing proper footwear, can make you more likely to have a fall. Screenings, including: Osteoporosis screening. Osteoporosis is a condition that causes the bones to get weaker and break more easily. Blood pressure screening. Blood pressure changes and medicines to control blood pressure can make you feel dizzy. Depression screening. You may be more likely to have a fall if you have a fear of falling, feel depressed, or feel unable to do activities that you used to do. Alcohol use screening. Using too much alcohol can affect your balance and may make you more likely to have a fall. Follow these instructions at home: Lifestyle Do not drink alcohol if: Your health care provider tells you not to drink. If you drink alcohol: Limit how much you have to: 0-1 drink a day for women. 0-2 drinks a day for men. Know how much alcohol is in your drink. In the [place] one drink equals one 12 oz bottle of beer (355 mL), one 5 oz glass of wine (148 mL), or one 1 oz glass of hard liquor (44 mL). Do not use any products that contain nicotine or tobacco. These products include cigarettes, chewing tobacco, and vaping devices, such as e-cigarettes. If you need help quitting, ask your health care provider. Activity  Follow a regular exercise program to stay fit. This will help you maintain your balance. Ask your health care provider what types of exercise are appropriate for you. If you need a cane or walker, use it as recommended by  your health care provider. Wear supportive shoes that have nonskid soles. Safety  Remove any tripping hazards, such as rugs, cords, and clutter. Install safety equipment such as grab bars in bathrooms and safety rails on stairs. Keep rooms and walkways well-lit. General instructions Talk with your health care provider about your risks for falling. Tell your health care provider if: You fall. Be sure to tell your  health care provider about all falls, even ones that seem minor. You feel dizzy, tiredness (fatigue), or off-balance. Take over-the-counter and prescription medicines only as told by your health care provider. These include supplements. Eat a healthy diet and maintain a healthy weight. A healthy diet includes low-fat dairy products, low-fat (lean) meats, and fiber from whole grains, beans, and lots of fruits and vegetables. Stay current with your vaccines. Schedule regular health, dental, and eye exams. Summary Having a healthy lifestyle and getting preventive care can help to protect your health and wellness after age 23. Screening and testing are the best way to find a health problem early and help you avoid having a fall. Early diagnosis and treatment give you the best chance for managing medical conditions that are more common for people who are older than age 35. Falls are a major cause of broken bones and head injuries in people who are older than age 67. Take precautions to prevent a fall at home. Work with your health care provider to learn what changes you can make to improve your health and wellness and to prevent falls. This information is not intended to replace advice given to you by your health care provider. Make sure you discuss any questions you have with your health care provider. Document Revised: 12/02/2020 Document Reviewed: 12/02/2020 Elsevier Patient Education  [place].

## 2022-03-05 DIAGNOSIS — G629 Polyneuropathy, unspecified: Secondary | ICD-10-CM | POA: Insufficient documentation

## 2022-03-05 DIAGNOSIS — F321 Major depressive disorder, single episode, moderate: Secondary | ICD-10-CM | POA: Insufficient documentation

## 2022-03-05 NOTE — Assessment & Plan Note (Signed)
Congratulated on weight loss from last year, he is motivated to continue healthy diet choices. Encouraged incorporating exercise into routine.

## 2022-03-05 NOTE — Assessment & Plan Note (Signed)
Chronic, stable on atorvastatin '40mg'$  daily. The ASCVD Risk score (Arnett DK, et al., 2019) failed to calculate for the following reasons:   The valid total cholesterol range is 130 to 320 mg/dL

## 2022-03-05 NOTE — Assessment & Plan Note (Signed)
Chronic, stable period on allopurinol '300mg'$  daily.

## 2022-03-05 NOTE — Assessment & Plan Note (Signed)
A1c stable in diet controlled diabetes range - encouraged limiting sugars in diet and continued portion control.

## 2022-03-05 NOTE — Assessment & Plan Note (Signed)
Pacemaker placement (Medtronic Micra AV leadless pacemaker) 08/2021

## 2022-03-05 NOTE — Assessment & Plan Note (Signed)
Chronic, stable on amlodipine, maxzide, and lasix daily - discussed he is on multiple diuretics - he desires to continue current regimen which is effective. Discussed starting OTC potassium given bordeline low potassium levels.

## 2022-03-05 NOTE — Assessment & Plan Note (Signed)
On testosterone weekly injection through urology

## 2022-03-05 NOTE — Assessment & Plan Note (Signed)
Stable period on spiriva daily with PRN albuterol.

## 2022-03-05 NOTE — Assessment & Plan Note (Signed)
Advanced directive - scanned and in chart 08/2018. Would want wife Aaron Mitchell then Aaron Mitchell to be HCPOA. Does not want prolonged life support if terminal condition. Ok with feeding tube short term.

## 2022-03-05 NOTE — Assessment & Plan Note (Signed)
Preventative protocols reviewed and updated unless pt declined. Discussed healthy diet and lifestyle.  

## 2022-03-05 NOTE — Assessment & Plan Note (Signed)
Continue yearly eye exam, latest 09/2021.

## 2022-03-05 NOTE — Assessment & Plan Note (Signed)
Newly endorsed, states he's been taking alpha lipoic acid '1800mg'$  daily to treat possible diabetic neuropathy - discussed normal dose is '600mg'$  daily.

## 2022-03-05 NOTE — Assessment & Plan Note (Addendum)
Compliant with CPAP use - continue. I asked him to send [place] CPAP data.

## 2022-03-05 NOTE — Assessment & Plan Note (Signed)
Followed by cardiology - on eliquis, only taking once daily. Planning to discuss possible AC discontinuation with cardiology after recent pacemaker.

## 2022-03-05 NOTE — Assessment & Plan Note (Signed)
Notes improvement since starting prozac '20mg'$  by Dr Lorelei Pont 10/2021, but feels it may be losing effect.  Desires to try higher dose which is reasonable. '40mg'$  dose sent to pharmacy. Update with effect.

## 2022-03-06 ENCOUNTER — Other Ambulatory Visit: Payer: Self-pay | Admitting: Family Medicine

## 2022-03-06 ENCOUNTER — Encounter: Payer: Self-pay | Admitting: Family Medicine

## 2022-03-06 DIAGNOSIS — G4733 Obstructive sleep apnea (adult) (pediatric): Secondary | ICD-10-CM

## 2022-03-25 DIAGNOSIS — G4733 Obstructive sleep apnea (adult) (pediatric): Secondary | ICD-10-CM | POA: Diagnosis not present

## 2022-04-10 ENCOUNTER — Other Ambulatory Visit: Payer: PPO

## 2022-04-10 DIAGNOSIS — E291 Testicular hypofunction: Secondary | ICD-10-CM

## 2022-04-11 LAB — HEMATOCRIT: Hematocrit: 50.4 % (ref 37.5–51.0)

## 2022-04-11 LAB — TESTOSTERONE: Testosterone: 740 ng/dL (ref 264–916)

## 2022-04-12 ENCOUNTER — Encounter: Payer: Self-pay | Admitting: Urology

## 2022-04-22 ENCOUNTER — Encounter: Payer: Self-pay | Admitting: Family Medicine

## 2022-04-24 ENCOUNTER — Other Ambulatory Visit: Payer: Self-pay | Admitting: Family Medicine

## 2022-04-24 ENCOUNTER — Other Ambulatory Visit: Payer: Self-pay | Admitting: Urology

## 2022-04-24 DIAGNOSIS — M1A00X0 Idiopathic chronic gout, unspecified site, without tophus (tophi): Secondary | ICD-10-CM

## 2022-04-25 DIAGNOSIS — G4733 Obstructive sleep apnea (adult) (pediatric): Secondary | ICD-10-CM | POA: Diagnosis not present

## 2022-04-27 ENCOUNTER — Telehealth: Payer: Self-pay

## 2022-04-27 NOTE — Chronic Care Management (AMB) (Signed)
Chronic Care Management Pharmacy Assistant   Name: Lemonte Al  MRN: [id number] DOB: February 22, 1946   Reason for Encounter: Medication Adherence and Delivery Coordination     Recent office visits:  03/04/22-Javier Gutierrez,MD(PCP)-[facility]- Labs(Discussed starting OTC potassium given bordeline low potassium levels. ) discussed screenings,pacemaker placement 08/2021-increase fluoxetine to '40mg'$ .Consider 1/2 tablet maxzide f/u 1 year  Recent consult visits:  02/26/22-Ben Chasnis,DO(PM&R)-lumbar injection with cortisone(dexamethasone )  Hospital visits:  None in previous 6 months  Medications: Outpatient Encounter Medications as of 04/27/2022  Medication Sig   acetaminophen (TYLENOL 8 HOUR ARTHRITIS PAIN) 650 MG CR tablet Take 2 tablets (1,300 mg total) by mouth 2 (two) times daily as needed for pain.   albuterol (PROVENTIL) (2.5 MG/3ML) 0.083% nebulizer solution Take 3 mLs (2.5 mg total) by nebulization every 6 (six) hours as needed for wheezing or shortness of breath.   albuterol (VENTOLIN HFA) 108 (90 Base) MCG/ACT inhaler Inhale 2 puffs into the lungs every 6 (six) hours as needed for wheezing or shortness of breath.   allopurinol (ZYLOPRIM) 300 MG tablet Take 1 tablet (300 mg total) by mouth daily.   Alpha-Lipoic Acid 600 MG CAPS Take 3 capsules (1,800 mg total) by mouth daily.   amLODipine (NORVASC) 5 MG tablet Take 1 tablet (5 mg total) by mouth daily.   apixaban (ELIQUIS) 5 MG TABS tablet Take 5 mg by mouth 2 (two) times daily.   atorvastatin (LIPITOR) 40 MG tablet TAKE ONE TABLET BY MOUTH BEFORE BREAKFAST Needs to schedule annual physcial for further refills   diclofenac sodium (VOLTAREN) 1 % GEL Apply 2 g topically daily as needed (pain). Apply to both hands.   FLUoxetine (PROZAC) 40 MG capsule Take 1 capsule (40 mg total) by mouth daily.   fluticasone (FLONASE) 50 MCG/ACT nasal spray instill TWO SPRAYS in each nostril daily   furosemide (LASIX) 40 MG tablet Take 1 tablet (40 mg total)  by mouth daily.   metaxalone (SKELAXIN) 800 MG tablet Take 1 tablet (800 mg total) by mouth 3 (three) times daily as needed for muscle spasms.   Multiple Vitamins tablet Take 1 tablet by mouth daily.   Potassium 99 MG TABS Take 1 tablet (99 mg total) by mouth daily.   sildenafil (VIAGRA) 100 MG tablet Take 100 mg by mouth as needed for erectile dysfunction.   tadalafil (CIALIS) 20 MG tablet Take 20 mg by mouth daily as needed for erectile dysfunction.   testosterone cypionate (DEPOTESTOSTERONE CYPIONATE) 200 MG/ML injection inject 0.71ms into THE muscle ONCE A WEEK   tiotropium (SPIRIVA HANDIHALER) 18 MCG inhalation capsule Place 1 capsule (18 mcg total) into inhaler and inhale daily.   traMADol (ULTRAM) 50 MG tablet Take 1 tablet (50 mg total) by mouth every 12 (twelve) hours as needed for moderate pain.   triamterene-hydrochlorothiazide (MAXZIDE-25) 37.5-25 MG tablet TAKE ONE TABLET BY MOUTH BEFORE BREAKFAST   zolpidem (AMBIEN) 10 MG tablet TAKE 1 TABLET(10 MG) BY MOUTH AT BEDTIME AS NEEDED FOR SLEEP   No facility-administered encounter medications on file as of 04/27/2022.   BP Readings from Last 3 Encounters:  03/04/22 128/76  10/30/21 132/80  10/08/21 135/85    Lab Results  Component Value Date   HGBA1C 6.5 02/23/2022      Recent OV, Consult or Hospital visit:  Recent medication changes indicated: 03/04/22-PCP-increase fluoxetine to '40mg'$ -take 1 tablet daily    Last adherence delivery date:02/05/22      Patient is due for next adherence delivery on: 05/07/22  Spoke with patient  on 04/28/22 reviewed medications and coordinated delivery.  This delivery to include: Adherence Packaging  90 Days  Packs: Triamterene 37.5 mg-HCTZ 25 mg 1 tablet  before breakfast Allopurinol 300 mg 1 tablet before breakfast  Amlodipine 5 mg 1 tablet before breakfast  Atorvastatin 40 mg 1 tablet before breakfast  Furosemide '40mg'$  take 1 tablet before breakfast   VIAL medications: Testosterone  '200mg'$ /ml -inject 0.4 once weekly   Patient declined the following medications this month: Eliquis  '5mg'$   take 1 tablet breakfast 1 tablet evening meal -supply on hand  Fluticasone 35mg/act 2 sprays each nostril daily as needed Spiriva- not due yet  Fluoxetine '40mg'$ -take 1 tablet daily,  not due yet   Any concerns about your medications? No  How often do you forget or accidentally miss a dose? Never  Do you use a pillbox? No  Is patient in packaging Yes  What is the date on your next pill pack? Not home to read pack date  Any concerns or issues with your packaging? satisfied   Refills requested from providers include:allopurinol,testosterone  Confirmed delivery date of 05/07/22, advised patient that pharmacy will contact them the morning of delivery.  Recent blood pressure readings are as follows:none available    Annual wellness visit in last year? Yes Most Recent BP reading:136/85  02/26/22    Cycle dispensing form sent to LMargaretmary Dys PTM for review.  LCharlene Brooke CPP notified  [facility] [phone]

## 2022-06-02 DIAGNOSIS — E782 Mixed hyperlipidemia: Secondary | ICD-10-CM | POA: Diagnosis not present

## 2022-06-02 DIAGNOSIS — I482 Chronic atrial fibrillation, unspecified: Secondary | ICD-10-CM | POA: Diagnosis not present

## 2022-06-02 DIAGNOSIS — G4733 Obstructive sleep apnea (adult) (pediatric): Secondary | ICD-10-CM | POA: Diagnosis not present

## 2022-06-02 DIAGNOSIS — I495 Sick sinus syndrome: Secondary | ICD-10-CM | POA: Diagnosis not present

## 2022-06-02 DIAGNOSIS — I447 Left bundle-branch block, unspecified: Secondary | ICD-10-CM | POA: Diagnosis not present

## 2022-06-02 DIAGNOSIS — I10 Essential (primary) hypertension: Secondary | ICD-10-CM | POA: Diagnosis not present

## 2022-06-12 NOTE — Telephone Encounter (Signed)
Incoming call from [employer], pt is requesting another refill. Per pharmacy pt lost the 86m mdv and that is a 545m supply per pharmacy. [facility] delivered all medications to patient and he "misplaced" testosterone. They have instructed him to look for medication and they will let us[place]ow if he finds it but it not, will Dr. StBernardo Heatere will to send another prescription? Please advise

## 2022-06-15 ENCOUNTER — Telehealth: Payer: Self-pay | Admitting: *Deleted

## 2022-06-15 ENCOUNTER — Encounter: Payer: Self-pay | Admitting: Urology

## 2022-06-15 DIAGNOSIS — I482 Chronic atrial fibrillation, unspecified: Secondary | ICD-10-CM | POA: Diagnosis not present

## 2022-06-15 NOTE — Telephone Encounter (Addendum)
Patient called and states he lost his this medication testosterone. [name] Kitchen He did let his [facility] pharmacy know about this. They are going to sent a refill request to [place] .[name] Kitchen [facility] called today and states they are going to send medication request over electronically

## 2022-06-16 MED ORDER — TESTOSTERONE CYPIONATE 200 MG/ML IM SOLN: INTRAMUSCULAR | 0 refills | Status: DC

## 2022-06-16 NOTE — Telephone Encounter (Signed)
Pt calls triage line and states that he is calling for an update on if his testosterone will be refilled. Please advise.

## 2022-06-16 NOTE — Addendum Note (Signed)
Addended by: Abbie Sons on: 06/16/2022 06:10 PM   Modules accepted: Orders

## 2022-06-17 DIAGNOSIS — I495 Sick sinus syndrome: Secondary | ICD-10-CM | POA: Diagnosis not present

## 2022-06-19 ENCOUNTER — Encounter: Payer: Self-pay | Admitting: Family Medicine

## 2022-07-09 DIAGNOSIS — I482 Chronic atrial fibrillation, unspecified: Secondary | ICD-10-CM | POA: Diagnosis not present

## 2022-07-09 DIAGNOSIS — I10 Essential (primary) hypertension: Secondary | ICD-10-CM | POA: Diagnosis not present

## 2022-07-10 DIAGNOSIS — B351 Tinea unguium: Secondary | ICD-10-CM | POA: Diagnosis not present

## 2022-07-10 DIAGNOSIS — E1140 Type 2 diabetes mellitus with diabetic neuropathy, unspecified: Secondary | ICD-10-CM | POA: Diagnosis not present

## 2022-07-10 DIAGNOSIS — L600 Ingrowing nail: Secondary | ICD-10-CM | POA: Diagnosis not present

## 2022-07-10 DIAGNOSIS — M2042 Other hammer toe(s) (acquired), left foot: Secondary | ICD-10-CM | POA: Diagnosis not present

## 2022-07-10 DIAGNOSIS — M2041 Other hammer toe(s) (acquired), right foot: Secondary | ICD-10-CM | POA: Diagnosis not present

## 2022-07-22 ENCOUNTER — Telehealth: Payer: Self-pay | Admitting: Family Medicine

## 2022-07-22 NOTE — Telephone Encounter (Signed)
[facility] called in requesting an updated medication list for this patient. They are stitching over to them. Fax# [phone] Attn:Mandy

## 2022-07-22 NOTE — Telephone Encounter (Signed)
List faxed  No further action needed at this time.

## 2022-07-23 DIAGNOSIS — M5416 Radiculopathy, lumbar region: Secondary | ICD-10-CM | POA: Diagnosis not present

## 2022-07-23 DIAGNOSIS — M5126 Other intervertebral disc displacement, lumbar region: Secondary | ICD-10-CM | POA: Diagnosis not present

## 2022-08-05 ENCOUNTER — Encounter: Payer: Self-pay | Admitting: Family Medicine

## 2022-08-05 NOTE — Telephone Encounter (Signed)
Updated pt's (and pt's wife's) pharmacy list.

## 2022-08-07 ENCOUNTER — Telehealth (INDEPENDENT_AMBULATORY_CARE_PROVIDER_SITE_OTHER): Payer: PPO | Admitting: Family Medicine

## 2022-08-07 ENCOUNTER — Encounter: Payer: Self-pay | Admitting: Family Medicine

## 2022-08-07 VITALS — BP 138/86 | HR 82 | Temp 97.6°F | Ht 68.5 in | Wt 232.0 lb

## 2022-08-07 DIAGNOSIS — G4733 Obstructive sleep apnea (adult) (pediatric): Secondary | ICD-10-CM

## 2022-08-07 DIAGNOSIS — G2581 Restless legs syndrome: Secondary | ICD-10-CM | POA: Diagnosis not present

## 2022-08-07 MED ORDER — BUPROPION HCL ER (SR) 100 MG PO TB12: 100.0000 mg | ORAL_TABLET | Freq: Every day | ORAL | 6 refills | Status: DC

## 2022-08-07 NOTE — Assessment & Plan Note (Signed)
Story consistent with RLS.  He has been on prozac '40mg'$  for the past ~4 months. ?contributing to symptoms - will taper off prozac and onto wellbutrin SR '100mg'$  daily with option to increase dose as needed.  I also asked him to come in for lab work to check iron panel and CBC. We also reviewed supportive measures he can do at home such as increased walking/exercise, leg massaging. He notes intermittent to moderate symptoms 2-3 times a week, could consider as needed Sinemet if needed.

## 2022-08-07 NOTE — Progress Notes (Signed)
Patient ID: Aaron Mitchell, male    DOB: 1945/08/25, 77 y.o.   MRN: [id number]  Virtual visit completed through [place], a video enabled telemedicine application. Due to national recommendations of social distancing due to COVID-19, a virtual visit is felt to be most appropriate for this patient at this time. Reviewed limitations, risks, security and privacy concerns of performing a virtual visit and the availability of in person appointments. I also reviewed that there may be a patient responsible charge related to this service. The patient agreed to proceed.   Interactive audio and video telecommunications were attempted between myself and Talmadge Ganas, however failed due to patient having technical difficulties OR patient not having access to video capability.  We continued and completed visit with audio only.  Time: 12:30pm - 12:45pm  Patient location: home Provider location: [place] at [facility], office Persons participating in this virtual visit: patient, provider   If any vitals were documented, they were collected by patient at home unless specified below.    BP 138/86   Pulse 82   Temp 97.6 F (36.4 C)   Ht 5' 8.5" (1.74 m)   Wt 232 lb (105.2 kg)   SpO2 97%   BMI 34.76 kg/m    CC: discuss restless legs Subjective:   HPI: Aaron Mitchell is a 78 y.o. male presenting on 08/07/2022 for Leg Problem (C/o restless legs during the night disturbing sleep about 3x/wk. Started about 3 wks ago. Denies any pain. )   Several weeks of legs feeling uncomfortable like he needs to move his legs to get relief (no tingling, cramping or pain). Only in evenings and at night time, watching TV sitting on couch or laying in bed.   He normally takes oral MVI.  He is not taking sedating antihistamine. He does take prozac daily for the past 4 months.  Drinks 16 oz coffee in am, no other caffeine during the day.  No seizure hx.   Known obstructive sleep apnea regular with CPAP therapy at  pressure setting of 10cmH2O. CPAP data from 02/2022 - average use >9 hours, average AHI 1.65/hr - tolerating, benefiting from CPAP.   No results found for: "IRON", "TIBC", "FERRITIN"  Lab Results  Component Value Date   WBC 10.1 11/11/2020   HGB 15.7 11/11/2020   HCT 50.4 04/10/2022   MCV 91.1 11/11/2020   PLT 166.0 11/11/2020      Relevant past medical, surgical, family and social history reviewed and updated as indicated. Interim medical history since our last visit reviewed. Allergies and medications reviewed and updated. Outpatient Medications Prior to Visit  Medication Sig Dispense Refill   acetaminophen (TYLENOL 8 HOUR ARTHRITIS PAIN) 650 MG CR tablet Take 2 tablets (1,300 mg total) by mouth 2 (two) times daily as needed for pain.     albuterol (PROVENTIL) (2.5 MG/3ML) 0.083% nebulizer solution Take 3 mLs (2.5 mg total) by nebulization every 6 (six) hours as needed for wheezing or shortness of breath. 90 mL 3   albuterol (VENTOLIN HFA) 108 (90 Base) MCG/ACT inhaler Inhale 2 puffs into the lungs every 6 (six) hours as needed for wheezing or shortness of breath. 3 Inhaler 3   allopurinol (ZYLOPRIM) 300 MG tablet Take 1 tablet (300 mg total) by mouth daily. 90 tablet 3   Alpha-Lipoic Acid 600 MG CAPS Take 3 capsules (1,800 mg total) by mouth daily.     amLODipine (NORVASC) 5 MG tablet Take 1 tablet (5 mg total) by mouth daily. 90 tablet 0  apixaban (ELIQUIS) 5 MG TABS tablet Take 5 mg by mouth 2 (two) times daily.     atorvastatin (LIPITOR) 40 MG tablet TAKE ONE TABLET BY MOUTH BEFORE BREAKFAST Needs to schedule annual physcial for further refills 90 tablet 3   diclofenac sodium (VOLTAREN) 1 % GEL Apply 2 g topically daily as needed (pain). Apply to both hands.     FLUoxetine (PROZAC) 40 MG capsule Take 1 capsule (40 mg total) by mouth daily. 90 capsule 3   fluticasone (FLONASE) 50 MCG/ACT nasal spray instill TWO SPRAYS in each nostril daily 48 g 0   furosemide (LASIX) 40 MG tablet  Take 1 tablet (40 mg total) by mouth daily. 90 tablet 3   metaxalone (SKELAXIN) 800 MG tablet Take 1 tablet (800 mg total) by mouth 3 (three) times daily as needed for muscle spasms. 90 tablet 0   Multiple Vitamins tablet Take 1 tablet by mouth daily.     Potassium 99 MG TABS Take 1 tablet (99 mg total) by mouth daily.     sildenafil (VIAGRA) 100 MG tablet Take 100 mg by mouth as needed for erectile dysfunction.     tadalafil (CIALIS) 20 MG tablet Take 20 mg by mouth daily as needed for erectile dysfunction.     testosterone cypionate (DEPOTESTOSTERONE CYPIONATE) 200 MG/ML injection inject 0.63ms into THE muscle ONCE A WEEK 10 mL 0   tiotropium (SPIRIVA HANDIHALER) 18 MCG inhalation capsule Place 1 capsule (18 mcg total) into inhaler and inhale daily. 90 capsule 3   traMADol (ULTRAM) 50 MG tablet Take 1 tablet (50 mg total) by mouth every 12 (twelve) hours as needed for moderate pain. 30 tablet 0   triamterene-hydrochlorothiazide (MAXZIDE-25) 37.5-25 MG tablet TAKE ONE TABLET BY MOUTH BEFORE BREAKFAST 90 tablet 3   zolpidem (AMBIEN) 10 MG tablet TAKE 1 TABLET(10 MG) BY MOUTH AT BEDTIME AS NEEDED FOR SLEEP 30 tablet 0   No facility-administered medications prior to visit.     Per HPI unless specifically indicated in ROS section below Review of Systems Objective:  BP 138/86   Pulse 82   Temp 97.6 F (36.4 C)   Ht 5' 8.5" (1.74 m)   Wt 232 lb (105.2 kg)   SpO2 97%   BMI 34.76 kg/m   Wt Readings from Last 3 Encounters:  08/07/22 232 lb (105.2 kg)  03/04/22 244 lb (110.7 kg)  12/03/21 238 lb (108 kg)       Physical exam: Gen: alert, NAD, not ill appearing Pulm: speaks in complete sentences without increased work of breathing Psych: normal mood, normal thought content      Results for orders placed or performed in visit on 04/10/22  Hematocrit  Result Value Ref Range   Hematocrit 50.4 37.5 - 51.0 %  Testosterone  Result Value Ref Range   Testosterone 740 264 - 916 ng/dL    Assessment & Plan:   RLS (restless legs syndrome) Assessment & Plan: Story consistent with RLS.  He has been on prozac '40mg'$  for the past ~4 months. ?contributing to symptoms - will taper off prozac and onto wellbutrin SR '100mg'$  daily with option to increase dose as needed.  I also asked him to come in for lab work to check iron panel and CBC. We also reviewed supportive measures he can do at home such as increased walking/exercise, leg massaging. He notes intermittent to moderate symptoms 2-3 times a week, could consider as needed Sinemet if needed.  Orders: -     Ferritin; Future -  IBC panel; Future -     CBC with Differential/Platelet; Future  OSA (obstructive sleep apnea) Assessment & Plan: Well controlled on latest evaluation on nightly CPAP at pressure setting of 10cmH2O.    Other orders -     buPROPion HCl ER (SR); Take 1 tablet (100 mg total) by mouth daily.  Dispense: 30 tablet; Refill: 6     I discussed the assessment and treatment plan with the patient. The patient was provided an opportunity to ask questions and all were answered. The patient agreed with the plan and demonstrated an understanding of the instructions. The patient was advised to call back or seek an in-person evaluation if the symptoms worsen or if the condition fails to improve as anticipated.  Follow up plan: No follow-ups on file.  [name], MD

## 2022-08-07 NOTE — Assessment & Plan Note (Signed)
Well controlled on latest evaluation on nightly CPAP at pressure setting of 10cmH2O.

## 2022-08-13 ENCOUNTER — Encounter: Payer: Self-pay | Admitting: Family Medicine

## 2022-08-14 ENCOUNTER — Other Ambulatory Visit (INDEPENDENT_AMBULATORY_CARE_PROVIDER_SITE_OTHER): Payer: PPO

## 2022-08-14 DIAGNOSIS — G2581 Restless legs syndrome: Secondary | ICD-10-CM

## 2022-08-14 LAB — CBC WITH DIFFERENTIAL/PLATELET
Basophils Absolute: 0.1 10*3/uL (ref 0.0–0.1)
Basophils Relative: 1.1 % (ref 0.0–3.0)
Eosinophils Absolute: 0.2 10*3/uL (ref 0.0–0.7)
Eosinophils Relative: 4.7 % (ref 0.0–5.0)
HCT: 45.2 % (ref 39.0–52.0)
Hemoglobin: 15 g/dL (ref 13.0–17.0)
Lymphocytes Relative: 24.8 % (ref 12.0–46.0)
Lymphs Abs: 1.2 10*3/uL (ref 0.7–4.0)
MCHC: 33.1 g/dL (ref 30.0–36.0)
MCV: 88.6 fl (ref 78.0–100.0)
Monocytes Absolute: 0.6 10*3/uL (ref 0.1–1.0)
Monocytes Relative: 12 % (ref 3.0–12.0)
Neutro Abs: 2.8 10*3/uL (ref 1.4–7.7)
Neutrophils Relative %: 57.4 % (ref 43.0–77.0)
Platelets: 181 10*3/uL (ref 150.0–400.0)
RBC: 5.1 Mil/uL (ref 4.22–5.81)
RDW: 17.1 % — ABNORMAL HIGH (ref 11.5–15.5)
WBC: 4.8 10*3/uL (ref 4.0–10.5)

## 2022-08-14 LAB — FERRITIN: Ferritin: 47.1 ng/mL (ref 22.0–322.0)

## 2022-08-14 LAB — IBC PANEL
Iron: 67 ug/dL (ref 42–165)
Saturation Ratios: 15.1 % — ABNORMAL LOW (ref 20.0–50.0)
TIBC: 442.4 ug/dL (ref 250.0–450.0)
Transferrin: 316 mg/dL (ref 212.0–360.0)

## 2022-08-21 ENCOUNTER — Telehealth: Payer: Self-pay

## 2022-08-21 NOTE — Telephone Encounter (Signed)
Noted. Updated pt's chart.

## 2022-08-21 NOTE — Telephone Encounter (Signed)
[facility] Night - Client Nonclinical Telephone Record  [facility] Client [facility] Night - Client Client Site [place] - Night Provider AA - PHYSICIAN, NOT LISTED- MD Contact Type Call Who Is Calling Patient / Member / Family / Caregiver Caller Name Declined to provide Caller Phone Number Declined to provide Call Type Message Only Information Provided Reason for Call Request for General Office Information Initial Comment Aaron Mitchell 03/27/1944 Cb# [phone] needs Lattie Haw to know that he got the erexy vaccine for RSV Disp. Time Disposition Final User 08/20/2022 5:01:05 PM General Information Provided Yes Alexis Frock Call Closed By: Alexis Frock Transaction Date/Time: 08/20/2022 4:59:07 PM (ET

## 2022-08-26 ENCOUNTER — Encounter: Payer: Self-pay | Admitting: Family Medicine

## 2022-08-30 MED ORDER — BUPROPION HCL ER (XL) 150 MG PO TB24: 150.0000 mg | ORAL_TABLET | Freq: Every day | ORAL | 3 refills | Status: DC

## 2022-09-13 NOTE — Progress Notes (Unsigned)
    Aaron Ewald T. Oyinkansola Truax, MD, Aaron Mitchell at [facility] [place], [address]  Phone: [phone]  FAX: [phone]  Aaron Mitchell - 77 y.o. male  MRN [id number]  Date of Birth: 06/01/1946  Date: 09/14/2022  PCP: [name], MD  Referral: [name], MD  No chief complaint on file.  Subjective:   Aaron Mitchell is a 77 y.o. very pleasant male patient with There is no height or weight on file to calculate BMI. who presents with the following:  Aaron Mitchell presents with some recurrent trochanteric bursitis.  I have seen him multiple times over the years, but I last saw him in January 2023.    Review of Systems is noted in the HPI, as appropriate  Objective:   There were no vitals taken for this visit.  GEN: No acute distress; alert,appropriate. PULM: Breathing comfortably in no respiratory distress PSYCH: Normally interactive.   Laboratory and Imaging Data:  Assessment and Plan:   ***

## 2022-09-14 ENCOUNTER — Ambulatory Visit (INDEPENDENT_AMBULATORY_CARE_PROVIDER_SITE_OTHER): Payer: PPO | Admitting: Family Medicine

## 2022-09-14 ENCOUNTER — Encounter: Payer: Self-pay | Admitting: Family Medicine

## 2022-09-14 VITALS — BP 118/62 | HR 92 | Temp 98.0°F | Ht 68.5 in | Wt 233.4 lb

## 2022-09-14 DIAGNOSIS — M7062 Trochanteric bursitis, left hip: Secondary | ICD-10-CM | POA: Diagnosis not present

## 2022-09-14 DIAGNOSIS — M7061 Trochanteric bursitis, right hip: Secondary | ICD-10-CM | POA: Diagnosis not present

## 2022-09-14 MED ORDER — TRIAMCINOLONE ACETONIDE 40 MG/ML IJ SUSP
40.0000 mg | Freq: Once | INTRAMUSCULAR | Status: AC
  Administered 2022-09-14: 40 mg via INTRA_ARTICULAR

## 2022-09-21 DIAGNOSIS — M5416 Radiculopathy, lumbar region: Secondary | ICD-10-CM | POA: Diagnosis not present

## 2022-09-21 DIAGNOSIS — M5126 Other intervertebral disc displacement, lumbar region: Secondary | ICD-10-CM | POA: Diagnosis not present

## 2022-09-21 DIAGNOSIS — M48062 Spinal stenosis, lumbar region with neurogenic claudication: Secondary | ICD-10-CM | POA: Diagnosis not present

## 2022-10-08 ENCOUNTER — Other Ambulatory Visit: Payer: Self-pay | Admitting: *Deleted

## 2022-10-08 DIAGNOSIS — R7989 Other specified abnormal findings of blood chemistry: Secondary | ICD-10-CM

## 2022-10-08 DIAGNOSIS — E291 Testicular hypofunction: Secondary | ICD-10-CM

## 2022-10-09 ENCOUNTER — Other Ambulatory Visit: Payer: PPO

## 2022-10-09 DIAGNOSIS — E291 Testicular hypofunction: Secondary | ICD-10-CM | POA: Diagnosis not present

## 2022-10-09 DIAGNOSIS — R7989 Other specified abnormal findings of blood chemistry: Secondary | ICD-10-CM | POA: Diagnosis not present

## 2022-10-10 LAB — HEMOGLOBIN AND HEMATOCRIT, BLOOD
Hematocrit: 49.6 % (ref 37.5–51.0)
Hemoglobin: 15.6 g/dL (ref 13.0–17.7)

## 2022-10-10 LAB — TESTOSTERONE: Testosterone: 853 ng/dL (ref 264–916)

## 2022-10-10 LAB — PSA: Prostate Specific Ag, Serum: 1.5 ng/mL (ref 0.0–4.0)

## 2022-10-12 ENCOUNTER — Encounter: Payer: Self-pay | Admitting: Urology

## 2022-10-12 ENCOUNTER — Ambulatory Visit: Payer: PPO | Admitting: Urology

## 2022-10-12 VITALS — BP 138/84 | HR 91 | Ht 70.0 in | Wt 226.0 lb

## 2022-10-12 DIAGNOSIS — E291 Testicular hypofunction: Secondary | ICD-10-CM

## 2022-10-12 NOTE — Progress Notes (Signed)
10/12/2022 9:35 AM   [name] January 14, 1946 [id number]  Referring provider: [name], MD [address] [place],  [place] [address]  Chief Complaint  Patient presents with   Other    Checking of labs    Urologic history: 1.  Hypogonadism -Symptoms tiredness, fatigue, ED, decreased libido -TRT  testosterone cypionate weekly   HPI: 77 y.o. male presents for annual follow-up.  Long history of hypogonadism previously followed by Dr. [name] and most recently Dr. [name] No bothersome LUTS Remains on TRT 0.4 mL (200 mg/cc) weekly Labs 10/09/22: Testosterone 853; hematocrit 49.6; PSA stable 1.5 Uses tadalafil occasionally Has lost 65 pounds since last visit using portion control Denies dysuria, gross hematuria   PMH: Past Medical History:  Diagnosis Date   Achilles tendinitis    Left leg   Borderline diabetes mellitus    Childhood asthma    Degenerative disc disease, lumbar    rec against surgery   Erectile dysfunction    GI bleed 12/2015   hospitalization   Gout    Hypertension    Ileus ([place]) 12/2015   hospitalization    Male hypogonadism    followed by urology on T injections ([place])   Seasonal allergies    pollen   Severe obesity (BMI >= 40) (HCC)    Shoulder bursitis    Sleep apnea    CPAP    Surgical History: Past Surgical History:  Procedure Laterality Date   ADENOIDECTOMY     CARPAL TUNNEL RELEASE Right 2015   CATARACT EXTRACTION W/PHACO Right 07/15/2016   Procedure: CATARACT EXTRACTION PHACO AND INTRAOCULAR LENS PLACEMENT ([place]);  Surgeon: Leandrew Koyanagi, MD;  Location: [facility];  Service: Ophthalmology;  Laterality: Right;  TORIC sleep apnea   CATARACT EXTRACTION W/PHACO Left 08/05/2016   Procedure: CATARACT EXTRACTION PHACO AND INTRAOCULAR LENS PLACEMENT ([facility]) toric;  Surgeon: Leandrew Koyanagi, MD;  Location: [facility];  Service: Ophthalmology;  Laterality: Left;  left sleep apnea toric   COLONOSCOPY   10/2012   rpt 5 yrs ([facility])   COLONOSCOPY WITH PROPOFOL N/A 03/20/2021   mult TAs, rpt 5 yrs Allen Norris, Darren, MD)   EYE SURGERY     Lens replacement 07/12/2016,08/05/2016   LUMBAR EPIDURAL INJECTION Bilateral 04/2017, 12/2017   S1 transforaminal ESI ([facility])   PACEMAKER LEADLESS INSERTION N/A 09/04/2021   Procedure: PACEMAKER LEADLESS INSERTION;  Surgeon: Isaias Cowman, MD;  Location: [facility];  Service: Cardiovascular;  Laterality: N/A;   RHINOPLASTY     TONSILLECTOMY  ?1954   TRIGGER FINGER RELEASE Right 2015   4th    Home Medications:  Allergies as of 10/12/2022       Reactions   Codeine Other (See Comments)   Other reaction(s): Other (See Comments) HEADACHES Causes headaches when he stops liquid Codeine-based products   Ace Inhibitors Cough   Cough also present with ARB (losartan)        Medication List        Accurate as of October 12, 2022  9:35 AM. If you have any questions, ask your nurse or doctor.          acetaminophen 650 MG CR tablet Commonly known as: Tylenol 8 Hour Arthritis Pain Take 2 tablets (1,300 mg total) by mouth 2 (two) times daily as needed for pain.   albuterol 108 (90 Base) MCG/ACT inhaler Commonly known as: VENTOLIN HFA Inhale 2 puffs into the lungs every 6 (six) hours as needed for wheezing or shortness of breath.  albuterol (2.5 MG/3ML) 0.083% nebulizer solution Commonly known as: PROVENTIL Take 3 mLs (2.5 mg total) by nebulization every 6 (six) hours as needed for wheezing or shortness of breath.   allopurinol 300 MG tablet Commonly known as: ZYLOPRIM Take 1 tablet (300 mg total) by mouth daily.   Alpha-Lipoic Acid 600 MG Caps Take 3 capsules (1,800 mg total) by mouth daily.   amLODipine 5 MG tablet Commonly known as: NORVASC Take 1 tablet (5 mg total) by mouth daily.   atorvastatin 40 MG tablet Commonly known as: LIPITOR TAKE ONE TABLET BY MOUTH BEFORE BREAKFAST Needs to schedule annual physcial for  further refills   buPROPion 150 MG 24 hr tablet Commonly known as: Wellbutrin XL Take 1 tablet (150 mg total) by mouth daily.   diclofenac sodium 1 % Gel Commonly known as: VOLTAREN Apply 2 g topically daily as needed (pain). Apply to both hands.   Eliquis 5 MG Tabs tablet Generic drug: apixaban Take 5 mg by mouth 2 (two) times daily.   fluticasone 50 MCG/ACT nasal spray Commonly known as: FLONASE instill TWO SPRAYS in each nostril daily   furosemide 40 MG tablet Commonly known as: LASIX Take 1 tablet (40 mg total) by mouth daily.   metaxalone 800 MG tablet Commonly known as: SKELAXIN Take 1 tablet (800 mg total) by mouth 3 (three) times daily as needed for muscle spasms.   Multiple Vitamins tablet Take 1 tablet by mouth daily.   Potassium 99 MG Tabs Take 1 tablet (99 mg total) by mouth daily.   sildenafil 100 MG tablet Commonly known as: VIAGRA Take 100 mg by mouth as needed for erectile dysfunction.   Spiriva HandiHaler 18 MCG inhalation capsule Generic drug: tiotropium Place 1 capsule (18 mcg total) into inhaler and inhale daily.   tadalafil 20 MG tablet Commonly known as: CIALIS Take 20 mg by mouth daily as needed for erectile dysfunction.   testosterone cypionate 200 MG/ML injection Commonly known as: DEPOTESTOSTERONE CYPIONATE inject 0.76mLs into THE muscle ONCE A WEEK   traMADol 50 MG tablet Commonly known as: ULTRAM Take 1 tablet (50 mg total) by mouth every 12 (twelve) hours as needed for moderate pain.   triamterene-hydrochlorothiazide 37.5-25 MG tablet Commonly known as: MAXZIDE-25 TAKE ONE TABLET BY MOUTH BEFORE BREAKFAST   zolpidem 10 MG tablet Commonly known as: AMBIEN TAKE 1 TABLET(10 MG) BY MOUTH AT BEDTIME AS NEEDED FOR SLEEP        Allergies:  Allergies  Allergen Reactions   Codeine Other (See Comments)    Other reaction(s): Other (See Comments) HEADACHES Causes headaches when he stops liquid Codeine-based products   Ace  Inhibitors Cough    Cough also present with ARB (losartan)    Family History: Family History  Problem Relation Age of Onset   Alcohol abuse Father    Hypertension Father    AVM Father 84       hemorrhagic stroke   Diabetes Paternal Grandmother    Cancer Neg Hx    CAD Neg Hx    Prostate cancer Neg Hx    Bladder Cancer Neg Hx    Kidney cancer Neg Hx     Social History:  reports that he quit smoking about 44 years ago. His smoking use included cigarettes. He has never used smokeless tobacco. He reports current alcohol use of about 5.0 standard drinks of alcohol per week. He reports that he does not use drugs.   Physical Exam: BP 138/84   Pulse 91  Ht 5\' 10"  (1.778 m)   Wt 226 lb (102.5 kg)   BMI 32.43 kg/m   Constitutional:  Alert and oriented, No acute distress. HEENT: [place] AT, moist mucus membranes.  Trachea midline, no masses. Cardiovascular: No clubbing, cyanosis, or edema. Respiratory: Normal respiratory effort, no increased work of breathing. Psychiatric: Normal mood and affect.   Assessment & Plan:    1.  Hypogonadism Stable symptoms on TRT Lab visit 6 months for testosterone, hematocrit Office visit 12 months testosterone, hematocrit, PSA and DRE Testosterone refilled     Abbie Sons, MD  [facility] [address] [place], [place] [address] [phone]

## 2022-10-15 ENCOUNTER — Other Ambulatory Visit: Payer: Self-pay | Admitting: Urology

## 2022-10-15 MED ORDER — TESTOSTERONE CYPIONATE 200 MG/ML IM SOLN: INTRAMUSCULAR | 0 refills | Status: DC

## 2022-11-02 DIAGNOSIS — M5416 Radiculopathy, lumbar region: Secondary | ICD-10-CM | POA: Diagnosis not present

## 2022-11-02 DIAGNOSIS — M5126 Other intervertebral disc displacement, lumbar region: Secondary | ICD-10-CM | POA: Diagnosis not present

## 2022-11-02 DIAGNOSIS — M48062 Spinal stenosis, lumbar region with neurogenic claudication: Secondary | ICD-10-CM | POA: Diagnosis not present

## 2022-11-09 ENCOUNTER — Other Ambulatory Visit: Payer: Self-pay | Admitting: Physical Medicine and Rehabilitation

## 2022-11-09 DIAGNOSIS — M5416 Radiculopathy, lumbar region: Secondary | ICD-10-CM

## 2022-11-12 ENCOUNTER — Telehealth: Payer: Self-pay | Admitting: Family Medicine

## 2022-11-12 NOTE — Telephone Encounter (Signed)
Contacted Dayshaun Gockley to schedule their annual wellness visit. Appointment made for 11/25/2022.  [facility] Care Guide [facility] AW[facility] TEAM Direct Dial: [phone]

## 2022-11-25 ENCOUNTER — Ambulatory Visit (INDEPENDENT_AMBULATORY_CARE_PROVIDER_SITE_OTHER): Payer: PPO

## 2022-11-25 VITALS — Ht 70.0 in | Wt 226.0 lb

## 2022-11-25 DIAGNOSIS — Z0000 Encounter for general adult medical examination without abnormal findings: Secondary | ICD-10-CM

## 2022-11-25 NOTE — Progress Notes (Signed)
Subjective:   Aaron Mitchell is a 77 y.o. male who presents for Medicare Annual/Subsequent preventive examination.   I connected with  Daisey Must on 11/25/22 by a audio enabled telemedicine application and verified that I am speaking with the correct person using two identifiers.  Patient Location: Home  Provider Location: Home Office  I discussed the limitations of evaluation and management by telemedicine. The patient expressed understanding and agreed to proceed.      Review of Systems     Cardiac Risk Factors include: advanced age (>49men, >75 women);diabetes mellitus;dyslipidemia;hypertension;male gender     Objective:    Today's Vitals   11/25/22 0832  Weight: 226 lb (102.5 kg)  Height: 5\' 10"  (1.778 m)   Body mass index is 32.43 kg/m.     11/25/2022    8:39 AM 12/03/2021    2:44 PM 09/04/2021    7:05 AM 10/09/2020   11:10 AM 07/25/2019   11:16 AM 07/05/2018   10:28 AM 07/01/2017    9:08 AM  Advanced Directives  Does Patient Have a Medical Advance Directive? Yes No No Yes Yes Yes Yes  Type of Estate agent of [place];Living will   Healthcare Power of [place];Living will Healthcare Power of [employer];Living will Healthcare Power of [place];Living will Healthcare Power of [place];Living will  Does patient want to make changes to medical advance directive? No - Patient declined        Copy of Healthcare Power of Attorney in Chart? Yes - validated most recent copy scanned in chart (See row information)   No - copy requested Yes - validated most recent copy scanned in chart (See row information) No - copy requested No - copy requested  Would patient like information on creating a medical advance directive?  No - Patient declined No - Patient declined        Current Medications (verified) Outpatient Encounter Medications as of 11/25/2022  Medication Sig   acetaminophen (TYLENOL 8 HOUR ARTHRITIS PAIN) 650 MG CR tablet Take 2 tablets (1,300 mg  total) by mouth 2 (two) times daily as needed for pain.   albuterol (PROVENTIL) (2.5 MG/3ML) 0.083% nebulizer solution Take 3 mLs (2.5 mg total) by nebulization every 6 (six) hours as needed for wheezing or shortness of breath.   albuterol (VENTOLIN HFA) 108 (90 Base) MCG/ACT inhaler Inhale 2 puffs into the lungs every 6 (six) hours as needed for wheezing or shortness of breath.   allopurinol (ZYLOPRIM) 300 MG tablet Take 1 tablet (300 mg total) by mouth daily.   Alpha-Lipoic Acid 600 MG CAPS Take 3 capsules (1,800 mg total) by mouth daily.   amLODipine (NORVASC) 5 MG tablet Take 1 tablet (5 mg total) by mouth daily.   apixaban (ELIQUIS) 5 MG TABS tablet Take 5 mg by mouth 2 (two) times daily.   atorvastatin (LIPITOR) 40 MG tablet TAKE ONE TABLET BY MOUTH BEFORE BREAKFAST Needs to schedule annual physcial for further refills   buPROPion (WELLBUTRIN XL) 150 MG 24 hr tablet Take 1 tablet (150 mg total) by mouth daily.   diclofenac sodium (VOLTAREN) 1 % GEL Apply 2 g topically daily as needed (pain). Apply to both hands.   fluticasone (FLONASE) 50 MCG/ACT nasal spray instill TWO SPRAYS in each nostril daily   furosemide (LASIX) 40 MG tablet Take 1 tablet (40 mg total) by mouth daily.   metaxalone (SKELAXIN) 800 MG tablet Take 1 tablet (800 mg total) by mouth 3 (three) times daily as needed for muscle spasms.  Multiple Vitamins tablet Take 1 tablet by mouth daily.   Potassium 99 MG TABS Take 1 tablet (99 mg total) by mouth daily.   sildenafil (VIAGRA) 100 MG tablet Take 100 mg by mouth as needed for erectile dysfunction.   tadalafil (CIALIS) 20 MG tablet Take 20 mg by mouth daily as needed for erectile dysfunction.   testosterone cypionate (DEPOTESTOSTERONE CYPIONATE) 200 MG/ML injection inject 0.92mLs into THE muscle ONCE A WEEK   tiotropium (SPIRIVA HANDIHALER) 18 MCG inhalation capsule Place 1 capsule (18 mcg total) into inhaler and inhale daily.   traMADol (ULTRAM) 50 MG tablet Take 1 tablet (50  mg total) by mouth every 12 (twelve) hours as needed for moderate pain.   triamterene-hydrochlorothiazide (MAXZIDE-25) 37.5-25 MG tablet TAKE ONE TABLET BY MOUTH BEFORE BREAKFAST   zolpidem (AMBIEN) 10 MG tablet TAKE 1 TABLET(10 MG) BY MOUTH AT BEDTIME AS NEEDED FOR SLEEP   No facility-administered encounter medications on file as of 11/25/2022.    Allergies (verified) Codeine and Ace inhibitors   History: Past Medical History:  Diagnosis Date   Achilles tendinitis    Left leg   Borderline diabetes mellitus    Childhood asthma    Degenerative disc disease, lumbar    rec against surgery   Erectile dysfunction    GI bleed 12/2015   hospitalization   Gout    Hypertension    Ileus (HCC) 12/2015   hospitalization    Male hypogonadism    followed by urology on T injections ([place])   Seasonal allergies    pollen   Severe obesity (BMI >= 40) (HCC)    Shoulder bursitis    Sleep apnea    CPAP   Past Surgical History:  Procedure Laterality Date   ADENOIDECTOMY     CARPAL TUNNEL RELEASE Right 2015   CATARACT EXTRACTION W/PHACO Right 07/15/2016   Procedure: CATARACT EXTRACTION PHACO AND INTRAOCULAR LENS PLACEMENT (IOC);  Surgeon: Lockie Mola, MD;  Location: [facility];  Service: Ophthalmology;  Laterality: Right;  TORIC sleep apnea   CATARACT EXTRACTION W/PHACO Left 08/05/2016   Procedure: CATARACT EXTRACTION PHACO AND INTRAOCULAR LENS PLACEMENT (IOC) toric;  Surgeon: Lockie Mola, MD;  Location: [facility];  Service: Ophthalmology;  Laterality: Left;  left sleep apnea toric   COLONOSCOPY  10/2012   rpt 5 yrs ([facility])   COLONOSCOPY WITH PROPOFOL N/A 03/20/2021   mult TAs, rpt 5 yrs Servando Snare, Darren, MD)   EYE SURGERY     Lens replacement 07/12/2016,08/05/2016   LUMBAR EPIDURAL INJECTION Bilateral 04/2017, 12/2017   S1 transforaminal ESI ([place])   PACEMAKER LEADLESS INSERTION N/A 09/04/2021   Procedure: PACEMAKER LEADLESS INSERTION;  Surgeon:  Marcina Millard, MD;  Location: [facility];  Service: Cardiovascular;  Laterality: N/A;   RHINOPLASTY     TONSILLECTOMY  ?1954   TRIGGER FINGER RELEASE Right 2015   4th   Family History  Problem Relation Age of Onset   Alcohol abuse Father    Hypertension Father    AVM Father 10       hemorrhagic stroke   Diabetes Paternal Grandmother    Cancer Neg Hx    CAD Neg Hx    Prostate cancer Neg Hx    Bladder Cancer Neg Hx    Kidney cancer Neg Hx    Social History   Socioeconomic History   Marital status: Married    Spouse name: Not on file   Number of children: Not on file   Years of education:  Not on file   Highest education level: Not on file  Occupational History   Not on file  Tobacco Use   Smoking status: Former    Types: Cigarettes    Quit date: 07/27/1978    Years since quitting: 44.3   Smokeless tobacco: Never   Tobacco comments:    Quit in 12/31/1978  Vaping Use   Vaping Use: Never used  Substance and Sexual Activity   Alcohol use: Yes    Alcohol/week: 5.0 standard drinks of alcohol    Types: 5 Shots of liquor per week    Comment:     Drug use: No   Sexual activity: Not on file  Other Topics Concern   Not on file  Social History Narrative   Lives at home with wife Liborio Nixon), dog passed away 2015/12/31   1 grown child   Occ: retired Personnel officer, was Engineer, structural    Edu: 2 yrs college   Activity: no regular exercise   Diet: good water, fruits daily, splenda iced tea   Social Determinants of Health   Financial Resource Strain: Low Risk  (11/25/2022)   Overall Financial Resource Strain (CARDIA)    Difficulty of Paying Living Expenses: Not hard at all  Food Insecurity: No Food Insecurity (11/25/2022)   Hunger Vital Sign    Worried About Running Out of Food in the Last Year: Never true    Ran Out of Food in the Last Year: Never true  Transportation Needs: No Transportation Needs (11/25/2022)   PRAPARE - Administrator, Civil Service (Medical):  No    Lack of Transportation (Non-Medical): No  Physical Activity: Inactive (11/25/2022)   Exercise Vital Sign    Days of Exercise per Week: 0 days    Minutes of Exercise per Session: 0 min  Stress: No Stress Concern Present (11/25/2022)   [employer] of Occupational Health - Occupational Stress Questionnaire    Feeling of Stress : Not at all  Social Connections: Moderately Integrated (11/25/2022)   Social Connection and Isolation Panel [NHANES]    Frequency of Communication with Friends and Family: More than three times a week    Frequency of Social Gatherings with Friends and Family: More than three times a week    Attends Religious Services: 1 to 4 times per year    Active Member of [employer] or Organizations: No    Attends Engineer, structural: Never    Marital Status: Married    Tobacco Counseling Counseling given: Not Answered Tobacco comments: Quit in 12/31/1978   Clinical Intake:  Pre-visit preparation completed: Yes  Pain : No/denies pain     BMI - recorded: 32.43 Nutritional Status: BMI > 30  Obese Nutritional Risks: None Diabetes: No  How often do you need to have someone help you when you read instructions, pamphlets, or other written materials from your doctor or pharmacy?: 1 - Never  Diabetic?   Yes  Interpreter Needed?: No  Information entered by :: Kandis Cocking, CMA   Activities of Daily Living    11/25/2022    8:40 AM 12/03/2021    2:46 PM  In your present state of health, do you have any difficulty performing the following activities:  Hearing? 0 0  Vision? 0 0  Difficulty concentrating or making decisions? 0 0  Walking or climbing stairs? 0 0  Dressing or bathing? 0 0  Doing errands, shopping? 0 0  Preparing Food and eating ? N N  Using the Toilet?  N N  In the past six months, have you accidently leaked urine? N N  Do you have problems with loss of bowel control? N N  Managing your Medications? N N  Managing your Finances? N N   Housekeeping or managing your Housekeeping? N N    Patient Care Team: Eustaquio Boyden, MD as PCP - General (Family Medicine) Kandyce Rud., MD (Rheumatology) Lockie Mola, MD as Referring Physician (Ophthalmology) Linus Salmons, MD (Otolaryngology) Phil Dopp, [facility] as Pharmacist (Pharmacist) Lamar Blinks, MD as Consulting Physician (Cardiology)  Indicate any recent Medical Services you may have received from other than [facility] providers in the past year (date may be approximate).     Assessment:   This is a routine wellness examination for [place].  Hearing/Vision screen Hearing Screening - Comments:: Denies hearing difficulties   Vision Screening - Comments:: Wears rx glasses - up to date with routine eye exams with [facility] in 2023  Dietary issues and exercise activities discussed: Current Exercise Habits: The patient does not participate in regular exercise at present, Exercise limited by: orthopedic condition(s)   Goals Addressed             This Visit's Progress    DIET - EAT MORE FRUITS AND VEGETABLES   On track      Depression Screen    11/25/2022    8:42 AM 09/14/2022   10:30 AM 08/07/2022   11:59 AM 12/03/2021    2:41 PM 10/30/2021   11:11 AM 10/09/2020   11:11 AM 07/25/2019   11:18 AM  PHQ 2/9 Scores  PHQ - 2 Score 1 0 0 2 6 0 0  PHQ- 9 Score  0  4 12 0 0    Fall Risk    11/25/2022    8:36 AM 09/14/2022   10:29 AM 08/07/2022   11:59 AM 12/03/2021    2:45 PM 10/09/2020   11:11 AM  Fall Risk   Falls in the past year? 0 0 0 1 0  Number falls in past yr: 0 0  0 0  Injury with Fall? 0 0  0 0  Risk for fall due to : No Fall Risks No Fall Risks  History of fall(s) Medication side effect  Follow up Falls prevention discussed Falls evaluation completed  Falls prevention discussed Falls evaluation completed;Falls prevention discussed    FALL RISK PREVENTION PERTAINING TO THE HOME:  Any stairs in or around the home? Yes  If so,  are there any without handrails? No  Home free of loose throw rugs in walkways, pet beds, electrical cords, etc? No  Adequate lighting in your home to reduce risk of falls? Yes   ASSISTIVE DEVICES UTILIZED TO PREVENT FALLS:  Life alert? No  Use of a cane, walker or w/c? No  Grab bars in the bathroom? No  Shower chair or bench in shower? No  Elevated toilet seat or a handicapped toilet? No   TIMED UP AND GO:  Was the test performed? No .    Televisit  Cognitive Function:    10/09/2020   11:17 AM 07/25/2019   11:19 AM 07/05/2018   10:29 AM 07/01/2017    8:56 AM  MMSE - Mini Mental State Exam  Not completed: Refused     Orientation to time  5 5 5   Orientation to Place  5 5 5   Registration  3 3 3   Attention/ Calculation  5 0 0  Recall  3 3 1   Recall-comments  unable to recall 2 of 3 words  Language- name 2 objects   0 0  Language- repeat  1 1 1   Language- follow 3 step command   3 3  Language- read & follow direction   0 0  Write a sentence   0 0  Copy design   0 0  Total score   20 18        11/25/2022    8:41 AM  6CIT Screen  What Year? 0 points  What month? 0 points  What time? 0 points  Count back from 20 0 points  Months in reverse 0 points  Repeat phrase 0 points  Total Score 0 points    Immunizations Immunization History  Administered Date(s) Administered   COVID-19, mRNA, vaccine(Comirnaty)12 years and older 06/30/2022   Fluad Quad(high Dose 65+) 06/30/2022   Influenza, High Dose Seasonal PF 05/14/2017, 04/07/2018, 04/18/2020, 05/06/2021   Influenza, Quadrivalent, Recombinant, Inj, Pf 03/20/2019, 06/12/2019   Influenza-Unspecified 08/27/2016   Moderna Covid-19 Vaccine Bivalent Booster 76yrs & up 05/01/2021   Moderna SARS-COV2 Booster Vaccination 12/13/2020   Moderna Sars-Covid-2 Vaccination 09/08/2019, 10/06/2019, 05/20/2020   Pneumococcal Conjugate-13 08/23/2014, 03/20/2019   Pneumococcal Polysaccharide-23 03/26/2016   Respiratory Syncytial Virus  Vaccine,Recomb Aduvanted(Arexvy) 06/30/2022   Td 08/23/2014   Tdap 08/23/2014   Zoster Recombinat (Shingrix) 03/14/2018, 05/16/2018   Zoster, Live 08/08/2013    TDAP status: Up to date  Flu Vaccine status: Up to date  Pneumococcal vaccine status: Up to date  Covid-19 vaccine status: Completed vaccines  Qualifies for Shingles Vaccine? Yes   Zostavax completed Yes   Shingrix Completed?: Yes  Screening Tests Health Maintenance  Topic Date Due   FOOT EXAM  Never done   HEMOGLOBIN A1C  08/26/2022   OPHTHALMOLOGY EXAM  10/02/2022   Diabetic kidney evaluation - eGFR measurement  02/24/2023   Diabetic kidney evaluation - Urine ACR  02/24/2023   INFLUENZA VACCINE  02/25/2023   Medicare Annual Wellness (AWV)  11/25/2023   DTaP/Tdap/Td (3 - Td or Tdap) 08/23/2024   COLONOSCOPY (Pts 45-70yrs Insurance coverage will need to be confirmed)  03/20/2026   Pneumonia Vaccine 77+ Years old  Completed   COVID-19 Vaccine  Completed   Hepatitis C Screening  Completed   Zoster Vaccines- Shingrix  Completed   HPV VACCINES  Aged Out   Fecal DNA (Cologuard)  Discontinued    Health Maintenance  Health Maintenance Due  Topic Date Due   FOOT EXAM  Never done   HEMOGLOBIN A1C  08/26/2022   OPHTHALMOLOGY EXAM  10/02/2022    Colorectal cancer screening: Type of screening: Colonoscopy. Completed 03/20/2021. Repeat every 5 years  Lung Cancer Screening: (Low Dose CT Chest recommended if Age 87-80 years, 30 pack-year currently smoking OR have quit w/in 15years.) does not qualify.   Lung Cancer Screening Referral: N/A  Additional Screening:  Hepatitis C Screening: does qualify; Completed 12/25/2016  Vision Screening: Recommended annual ophthalmology exams for early detection of glaucoma and other disorders of the eye.  Is the patient up to date with their annual eye exam?  No   Who is the provider or what is the name of the office in which the patient attends annual eye exams?  [facility]  If pt is not established with a provider, would they like to be referred to a provider to establish care? No .   Dental Screening: Recommended annual dental exams for proper oral hygiene  Community Resource Referral / Chronic Care Management: CRR  required this visit?  No   CCM required this visit?  No      Plan:     I have personally reviewed and noted the following in the patient's chart:   Medical and social history Use of alcohol, tobacco or illicit drugs  Current medications and supplements including opioid prescriptions. Patient is not currently taking opioid prescriptions. Functional ability and status Nutritional status Physical activity Advanced directives List of other physicians Hospitalizations, surgeries, and ER visits in previous 12 months Vitals Screenings to include cognitive, depression, and falls Referrals and appointments  In addition, I have reviewed and discussed with patient certain preventive protocols, quality metrics, and best practice recommendations. A written personalized care plan for preventive services as well as general preventive health recommendations were provided to patient.     Milus Mallick, CMA   11/25/2022   Nurse Notes:   Discussed with patient need for updated eye exam

## 2022-11-25 NOTE — Patient Instructions (Addendum)
Aaron Mitchell , Thank you for taking time to come for your Medicare Wellness Visit. I appreciate your ongoing commitment to your health goals. Please review the following plan we discussed and let me know if I can assist you in the future.   These are the goals we discussed:  Goals      DIET - EAT MORE FRUITS AND VEGETABLES     Patient Stated     Starting 07/05/2018, I will continue to take medications as prescribed.      Patient Stated     07/25/2019, I will maintain and continue medications as prescribed.      Patient Stated     10/09/2020, I will maintain and continue medications as prescribed.      Pharmacy Care Plan     .Current Barriers:  Chronic Disease Management support, education, and care coordination needs related to hypertension, hyperlipidemia, hypogonadism, allergic rhinitis, COPD, diabetic retinopathy, gout, osteoarthritis, pre-diabetes, chronic back/shoulder pain, insomnia  Pharmacist Clinical Goal(s):  Pharmacist will review medication safety every 6 months Maintain blood pressure within goal of < 140/90 mmHg Maintain A1c within goal of < 7% Increase exercise slowly with goal of 150 minutes of moderate activity (such as brisk walking) each week  Interventions: Comprehensive medication review performed. Coordinate adherence packaging and medication delivery Reduce amlodipine prescription from twice daily to once daily (patient is already taking this way)  Patient Self Care Activities:  Self-administers medications as prescribed Self monitors vital signs, including blood pressure, heart rate, and pulse oximetry  Initial goal documentation          This is a list of the screening recommended for you and due dates:  Health Maintenance  Topic Date Due   Complete foot exam   Never done   Hemoglobin A1C  08/26/2022   Eye exam for diabetics  10/02/2022   Yearly kidney function blood test for diabetes  02/24/2023   Yearly kidney health urinalysis for diabetes   02/24/2023   Flu Shot  02/25/2023   Medicare Annual Wellness Visit  11/25/2023   DTaP/Tdap/Td vaccine (3 - Td or Tdap) 08/23/2024   Colon Cancer Screening  03/20/2026   Pneumonia Vaccine  Completed   COVID-19 Vaccine  Completed   Hepatitis C Screening: USPSTF Recommendation to screen - Ages 20-79 yo.  Completed   Zoster (Shingles) Vaccine  Completed   HPV Vaccine  Aged Out   Cologuard (Stool DNA test)  Discontinued    Advanced directives:  Patient to bring copy of Living Will.  Conditions/risks identified: Keep up the good work  Next appointment: Follow up in one year for your annual wellness visit.   11/29/2023  Preventive Care 65 Years and Older, Male  Preventive care refers to lifestyle choices and visits with your health care provider that can promote health and wellness. What does preventive care include? A yearly physical exam. This is also called an annual well check. Dental exams once or twice a year. Routine eye exams. Ask your health care provider how often you should have your eyes checked. Personal lifestyle choices, including: Daily care of your teeth and gums. Regular physical activity. Eating a healthy diet. Avoiding tobacco and drug use. Limiting alcohol use. Practicing safe sex. Taking low doses of aspirin every day. Taking vitamin and mineral supplements as recommended by your health care provider. What happens during an annual well check? The services and screenings done by your health care provider during your annual well check will depend on your age, overall  health, lifestyle risk factors, and family history of disease. Counseling  Your health care provider may ask you questions about your: Alcohol use. Tobacco use. Drug use. Emotional well-being. Home and relationship well-being. Sexual activity. Eating habits. History of falls. Memory and ability to understand (cognition). Work and work Astronomer. Screening  You may have the following tests  or measurements: Height, weight, and BMI. Blood pressure. Lipid and cholesterol levels. These may be checked every 5 years, or more frequently if you are over 75 years old. Skin check. Lung cancer screening. You may have this screening every year starting at age 56 if you have a 30-pack-year history of smoking and currently smoke or have quit within the past 15 years. Fecal occult blood test (FOBT) of the stool. You may have this test every year starting at age 75. Flexible sigmoidoscopy or colonoscopy. You may have a sigmoidoscopy every 5 years or a colonoscopy every 10 years starting at age 75. Prostate cancer screening. Recommendations will vary depending on your family history and other risks. Hepatitis C blood test. Hepatitis B blood test. Sexually transmitted disease (STD) testing. Diabetes screening. This is done by checking your blood sugar (glucose) after you have not eaten for a while (fasting). You may have this done every 1-3 years. Abdominal aortic aneurysm (AAA) screening. You may need this if you are a current or former smoker. Osteoporosis. You may be screened starting at age 75 if you are at high risk. Talk with your health care provider about your test results, treatment options, and if necessary, the need for more tests. Vaccines  Your health care provider may recommend certain vaccines, such as: Influenza vaccine. This is recommended every year. Tetanus, diphtheria, and acellular pertussis (Tdap, Td) vaccine. You may need a Td booster every 10 years. Zoster vaccine. You may need this after age 75. Pneumococcal 13-valent conjugate (PCV13) vaccine. One dose is recommended after age 75. Pneumococcal polysaccharide (PPSV23) vaccine. One dose is recommended after age 75. Talk to your health care provider about which screenings and vaccines you need and how often you need them. This information is not intended to replace advice given to you by your health care provider. Make  sure you discuss any questions you have with your health care provider. Document Released: 08/09/2015 Document Revised: 04/01/2016 Document Reviewed: 05/14/2015 Elsevier Interactive Patient Education  2017 [employer].  Fall Prevention in the Home Falls can cause injuries. They can happen to people of all ages. There are many things you can do to make your home safe and to help prevent falls. What can I do on the outside of my home? Regularly fix the edges of walkways and driveways and fix any cracks. Remove anything that might make you trip as you walk through a door, such as a raised step or threshold. Trim any bushes or trees on the path to your home. Use bright outdoor lighting. Clear any walking paths of anything that might make someone trip, such as rocks or tools. Regularly check to see if handrails are loose or broken. Make sure that both sides of any steps have handrails. Any raised decks and porches should have guardrails on the edges. Have any leaves, snow, or ice cleared regularly. Use sand or salt on walking paths during winter. Clean up any spills in your garage right away. This includes oil or grease spills. What can I do in the bathroom? Use night lights. Install grab bars by the toilet and in the tub and shower. Do not  use towel bars as grab bars. Use non-skid mats or decals in the tub or shower. If you need to sit down in the shower, use a plastic, non-slip stool. Keep the floor dry. Clean up any water that spills on the floor as soon as it happens. Remove soap buildup in the tub or shower regularly. Attach bath mats securely with double-sided non-slip rug tape. Do not have throw rugs and other things on the floor that can make you trip. What can I do in the bedroom? Use night lights. Make sure that you have a light by your bed that is easy to reach. Do not use any sheets or blankets that are too big for your bed. They should not hang down onto the floor. Have a firm  chair that has side arms. You can use this for support while you get dressed. Do not have throw rugs and other things on the floor that can make you trip. What can I do in the kitchen? Clean up any spills right away. Avoid walking on wet floors. Keep items that you use a lot in easy-to-reach places. If you need to reach something above you, use a strong step stool that has a grab bar. Keep electrical cords out of the way. Do not use floor polish or wax that makes floors slippery. If you must use wax, use non-skid floor wax. Do not have throw rugs and other things on the floor that can make you trip. What can I do with my stairs? Do not leave any items on the stairs. Make sure that there are handrails on both sides of the stairs and use them. Fix handrails that are broken or loose. Make sure that handrails are as long as the stairways. Check any carpeting to make sure that it is firmly attached to the stairs. Fix any carpet that is loose or worn. Avoid having throw rugs at the top or bottom of the stairs. If you do have throw rugs, attach them to the floor with carpet tape. Make sure that you have a light switch at the top of the stairs and the bottom of the stairs. If you do not have them, ask someone to add them for you. What else can I do to help prevent falls? Wear shoes that: Do not have high heels. Have rubber bottoms. Are comfortable and fit you well. Are closed at the toe. Do not wear sandals. If you use a stepladder: Make sure that it is fully opened. Do not climb a closed stepladder. Make sure that both sides of the stepladder are locked into place. Ask someone to hold it for you, if possible. Clearly mark and make sure that you can see: Any grab bars or handrails. First and last steps. Where the edge of each step is. Use tools that help you move around (mobility aids) if they are needed. These include: Canes. Walkers. Scooters. Crutches. Turn on the lights when you go  into a dark area. Replace any light bulbs as soon as they burn out. Set up your furniture so you have a clear path. Avoid moving your furniture around. If any of your floors are uneven, fix them. If there are any pets around you, be aware of where they are. Review your medicines with your doctor. Some medicines can make you feel dizzy. This can increase your chance of falling. Ask your doctor what other things that you can do to help prevent falls. This information is not intended to replace advice  given to you by your health care provider. Make sure you discuss any questions you have with your health care provider. Document Released: 05/09/2009 Document Revised: 12/19/2015 Document Reviewed: 08/17/2014 Elsevier Interactive Patient Education  2017 [employer].

## 2022-12-02 ENCOUNTER — Ambulatory Visit (INDEPENDENT_AMBULATORY_CARE_PROVIDER_SITE_OTHER): Payer: PPO | Admitting: Family Medicine

## 2022-12-02 ENCOUNTER — Encounter: Payer: Self-pay | Admitting: Family Medicine

## 2022-12-02 VITALS — BP 118/80 | HR 83 | Temp 97.9°F | Ht 68.5 in | Wt 241.4 lb

## 2022-12-02 DIAGNOSIS — M7062 Trochanteric bursitis, left hip: Secondary | ICD-10-CM | POA: Diagnosis not present

## 2022-12-02 MED ORDER — TRIAMCINOLONE ACETONIDE 40 MG/ML IJ SUSP
40.0000 mg | Freq: Once | INTRAMUSCULAR | Status: AC
Start: 2022-12-02 — End: 2022-12-02
  Administered 2022-12-02: 40 mg via INTRA_ARTICULAR

## 2022-12-02 NOTE — Progress Notes (Signed)
    Aaron Mitchell T. Brystal Kildow, MD, CAQ Sports Medicine [facility] at [facility] [address] [place], [address]  Phone: [phone]  FAX: [phone]  [name] - 77 y.o. male  MRN [id number]  Date of Birth: Nov 14, 1945  Date: 12/02/2022  PCP: Eustaquio Boyden, MD  Referral: Eustaquio Boyden, MD  Chief Complaint  Patient presents with   Hip Pain    Left    Troch bursitis injection, procedure only.  Aspiration/Injection Procedure Note Kendre Kollman 1945-09-03 Date of procedure: 12/02/2022  Procedure: Large Joint Aspiration / Injection of Hip, Trochanteric Bursa, L Indications: Pain  Procedure Details Verbal consent obtained. Risks, benefits, and alternatives reviewed. Greater trochanter sterilely prepped with Chloraprep. Ethyl Chloride used for anesthesia. 9 cc of Lidocaine 1% injected with 1 mL of Kenalog 40 mg into trochanteric bursa at area of maximal tenderness at greater trochanter. Needle taken to bone to troch bursa, flows easily. Bursa massaged. No bleeding and no complications. Decreased pain after injection. Needle: 22 gauge spinal needle Medication: 1 mL of Kenalog 40 mg     ICD-10-CM   1. Trochanteric bursitis, left hip  M70.62 triamcinolone acetonide (KENALOG-40) injection 40 mg      Medication Management during today's office visit: Meds ordered this encounter  Medications   triamcinolone acetonide (KENALOG-40) injection 40 mg

## 2022-12-03 ENCOUNTER — Encounter: Payer: Self-pay | Admitting: Family Medicine

## 2022-12-08 DIAGNOSIS — I495 Sick sinus syndrome: Secondary | ICD-10-CM | POA: Diagnosis not present

## 2022-12-09 DIAGNOSIS — E119 Type 2 diabetes mellitus without complications: Secondary | ICD-10-CM | POA: Diagnosis not present

## 2022-12-09 DIAGNOSIS — Z961 Presence of intraocular lens: Secondary | ICD-10-CM | POA: Diagnosis not present

## 2022-12-09 DIAGNOSIS — H3509 Other intraretinal microvascular abnormalities: Secondary | ICD-10-CM | POA: Diagnosis not present

## 2022-12-09 DIAGNOSIS — H53001 Unspecified amblyopia, right eye: Secondary | ICD-10-CM | POA: Diagnosis not present

## 2022-12-14 LAB — HM DIABETES EYE EXAM

## 2022-12-16 ENCOUNTER — Ambulatory Visit: Payer: PPO | Admitting: Family Medicine

## 2022-12-23 NOTE — Progress Notes (Unsigned)
    Aaron Hutchinson T. Drago Hammonds, MD, CAQ Sports Medicine [facility] at [facility] [address] [place], [address]  Phone: [phone]  FAX: [phone]  Aaron Mitchell - 77 y.o. male  MRN [id number]  Date of Birth: 11/16/1945  Date: 12/24/2022  PCP: Aaron Boyden, MD  Referral: Aaron Boyden, MD  No chief complaint on file.  Subjective:   Aaron Mitchell is a 77 y.o. very pleasant male very pleasant male patient with There is no height or weight on file to calculate BMI. who presents with the following:  Aaron Mitchell is here with right-sided hip pain.    Review of Systems is noted in the HPI, as appropriate  Objective:   There were no vitals taken for this visit.  GEN: No acute distress; alert,appropriate. PULM: Breathing comfortably in no respiratory distress PSYCH: Normally interactive.   Laboratory and Imaging Data:  Assessment and Plan:   ***

## 2022-12-24 ENCOUNTER — Encounter: Payer: Self-pay | Admitting: Family Medicine

## 2022-12-24 ENCOUNTER — Ambulatory Visit (INDEPENDENT_AMBULATORY_CARE_PROVIDER_SITE_OTHER): Payer: PPO | Admitting: Family Medicine

## 2022-12-24 VITALS — BP 120/74 | HR 89 | Temp 98.2°F | Ht 68.5 in | Wt 227.4 lb

## 2022-12-24 DIAGNOSIS — M7061 Trochanteric bursitis, right hip: Secondary | ICD-10-CM | POA: Diagnosis not present

## 2022-12-24 DIAGNOSIS — M7062 Trochanteric bursitis, left hip: Secondary | ICD-10-CM | POA: Diagnosis not present

## 2022-12-24 MED ORDER — TRIAMCINOLONE ACETONIDE 40 MG/ML IJ SUSP
40.0000 mg | Freq: Once | INTRAMUSCULAR | Status: AC
Start: 2022-12-24 — End: 2022-12-24
  Administered 2022-12-24: 40 mg via INTRA_ARTICULAR

## 2023-01-08 ENCOUNTER — Telehealth: Payer: Self-pay | Admitting: Family Medicine

## 2023-01-08 NOTE — Telephone Encounter (Signed)
Filled and in lisa's box.  

## 2023-01-08 NOTE — Telephone Encounter (Signed)
Placed form in Dr. G's box.  

## 2023-01-08 NOTE — Telephone Encounter (Signed)
Type of forms received: handicap parking placard   Routed to: [employer] pool   Paperwork received by : Audree Camel   Individual made aware of 3-5 business day turn around (Y/N): Y   Form completed and patient made aware of charges(Y/N): Y    Faxed to : pt requested a call back once ppw is comp @ [phone]  Form location:  gutier[employer] folder

## 2023-01-11 ENCOUNTER — Other Ambulatory Visit: Payer: Self-pay | Admitting: Family Medicine

## 2023-01-11 DIAGNOSIS — F321 Major depressive disorder, single episode, moderate: Secondary | ICD-10-CM

## 2023-01-11 NOTE — Telephone Encounter (Signed)
E-scribed refill  Pt had [facility] wellness on 11/25/22. Plz schedule CPE and fasting lab (no food/drink- except water and/or blk coffee 5 hrs prior) visits for additional refills.

## 2023-01-11 NOTE — Telephone Encounter (Signed)
Scheduled cpe for 04/27/23

## 2023-01-11 NOTE — Telephone Encounter (Signed)
Left message on vm per dpr notifying pt his [place] [employer] Parking Placard form is ready to pick up.  [Placed for at front office.]

## 2023-01-18 ENCOUNTER — Encounter: Payer: Self-pay | Admitting: Family Medicine

## 2023-01-18 DIAGNOSIS — F321 Major depressive disorder, single episode, moderate: Secondary | ICD-10-CM

## 2023-01-18 NOTE — Telephone Encounter (Signed)
Looks like the ones listed below have been called in for 30 day ok to change Wellbutrin  Tramadol Ambien

## 2023-01-19 MED ORDER — BUPROPION HCL ER (XL) 150 MG PO TB24: 150.0000 mg | ORAL_TABLET | Freq: Every day | ORAL | 1 refills | Status: DC

## 2023-01-19 NOTE — Telephone Encounter (Signed)
Please verify pharmacy is changed in our records.

## 2023-01-19 NOTE — Telephone Encounter (Addendum)
Wellbutrin can be 90 days, other meds are controlled and should be 1 month at a time.

## 2023-01-19 NOTE — Addendum Note (Signed)
Addended by: Eustaquio Boyden on: 01/19/2023 12:49 AM   Modules accepted: Orders

## 2023-01-19 NOTE — Telephone Encounter (Signed)
[facility] has been deleted from pt's chart. Only pharmacy shown is [facility].

## 2023-01-21 ENCOUNTER — Ambulatory Visit (HOSPITAL_COMMUNITY)
Admission: RE | Admit: 2023-01-21 | Discharge: 2023-01-21 | Disposition: A | Discharge status: Home / Self Care | Payer: PPO | Source: Ambulatory Visit | Attending: Physician Assistant | Admitting: Physician Assistant

## 2023-01-21 ENCOUNTER — Ambulatory Visit (HOSPITAL_COMMUNITY)
Admission: RE | Admit: 2023-01-21 | Discharge: 2023-01-21 | Disposition: A | Discharge status: Home / Self Care | Payer: PPO | Source: Ambulatory Visit | Attending: Physical Medicine and Rehabilitation | Admitting: Physical Medicine and Rehabilitation

## 2023-01-21 DIAGNOSIS — I517 Cardiomegaly: Secondary | ICD-10-CM | POA: Diagnosis not present

## 2023-01-21 DIAGNOSIS — Z950 Presence of cardiac pacemaker: Secondary | ICD-10-CM | POA: Diagnosis not present

## 2023-01-21 DIAGNOSIS — M5115 Intervertebral disc disorders with radiculopathy, thoracolumbar region: Secondary | ICD-10-CM | POA: Diagnosis not present

## 2023-01-21 DIAGNOSIS — M5116 Intervertebral disc disorders with radiculopathy, lumbar region: Secondary | ICD-10-CM | POA: Diagnosis not present

## 2023-01-21 DIAGNOSIS — M5416 Radiculopathy, lumbar region: Secondary | ICD-10-CM | POA: Diagnosis not present

## 2023-01-21 DIAGNOSIS — M48061 Spinal stenosis, lumbar region without neurogenic claudication: Secondary | ICD-10-CM | POA: Diagnosis not present

## 2023-01-21 DIAGNOSIS — M4726 Other spondylosis with radiculopathy, lumbar region: Secondary | ICD-10-CM | POA: Diagnosis not present

## 2023-01-21 NOTE — Progress Notes (Signed)
Per order, Changed device settings for MRI to   VOO at 80 bpm  Will program device back to pre-MRI settings after completion of exam, and send transmission 

## 2023-02-16 DIAGNOSIS — K121 Other forms of stomatitis: Secondary | ICD-10-CM | POA: Diagnosis not present

## 2023-02-25 DIAGNOSIS — M79672 Pain in left foot: Secondary | ICD-10-CM | POA: Diagnosis not present

## 2023-02-25 DIAGNOSIS — E1140 Type 2 diabetes mellitus with diabetic neuropathy, unspecified: Secondary | ICD-10-CM | POA: Diagnosis not present

## 2023-02-25 DIAGNOSIS — S8012XA Contusion of left lower leg, initial encounter: Secondary | ICD-10-CM | POA: Diagnosis not present

## 2023-03-01 ENCOUNTER — Ambulatory Visit: Payer: PPO | Admitting: Family Medicine

## 2023-03-07 NOTE — Progress Notes (Unsigned)
     T. , MD, CAQ Sports Medicine [facility] at [facility] [address] [place], [address]  Phone: [phone]  FAX: [phone]  Aaron Mitchell - 77 y.o. male  MRN [id number]  Date of Birth: 04-13-1946  Date: 03/08/2023  PCP: Eustaquio Boyden, MD  Referral: Eustaquio Boyden, MD  No chief complaint on file.   Aspiration/Injection Procedure Note Aaron Mitchell Apr 09, 1946 Date of procedure: 03/08/2023  Procedure: Large Joint Aspiration / Injection of Hip, Trochanteric Bursa, R Indications: Pain  Procedure Details Verbal consent obtained. Risks, benefits, and alternatives reviewed. Greater trochanter sterilely prepped with Chloraprep. Ethyl Chloride used for anesthesia. 9 cc of Lidocaine 1% injected with 1 mL of Kenalog 40 mg into trochanteric bursa at area of maximal tenderness at greater trochanter. Needle taken to bone to troch bursa, flows easily. Bursa massaged. No bleeding and no complications. Decreased pain after injection. Needle: 22 gauge spinal needle Medication: 1 mL of Kenalog 40 mg   No diagnosis found.  Medication Management during today's office visit: No orders of the defined types were placed in this encounter.  There are no discontinued medications.  Orders placed today for conditions managed today: No orders of the defined types were placed in this encounter.

## 2023-03-08 ENCOUNTER — Encounter: Payer: Self-pay | Admitting: Family Medicine

## 2023-03-08 ENCOUNTER — Ambulatory Visit (INDEPENDENT_AMBULATORY_CARE_PROVIDER_SITE_OTHER): Payer: PPO | Admitting: Family Medicine

## 2023-03-08 VITALS — BP 138/80 | HR 90 | Temp 98.0°F | Ht 68.5 in | Wt 235.0 lb

## 2023-03-08 DIAGNOSIS — M7062 Trochanteric bursitis, left hip: Secondary | ICD-10-CM | POA: Diagnosis not present

## 2023-03-08 DIAGNOSIS — M7061 Trochanteric bursitis, right hip: Secondary | ICD-10-CM | POA: Diagnosis not present

## 2023-03-08 DIAGNOSIS — M47816 Spondylosis without myelopathy or radiculopathy, lumbar region: Secondary | ICD-10-CM | POA: Diagnosis not present

## 2023-03-08 DIAGNOSIS — M48062 Spinal stenosis, lumbar region with neurogenic claudication: Secondary | ICD-10-CM | POA: Diagnosis not present

## 2023-03-08 DIAGNOSIS — M5416 Radiculopathy, lumbar region: Secondary | ICD-10-CM | POA: Diagnosis not present

## 2023-03-08 MED ORDER — TRIAMCINOLONE ACETONIDE 40 MG/ML IJ SUSP
40.0000 mg | Freq: Once | INTRAMUSCULAR | Status: AC
Start: 2023-03-08 — End: 2023-03-08
  Administered 2023-03-08: 40 mg via INTRA_ARTICULAR

## 2023-04-08 DIAGNOSIS — G4733 Obstructive sleep apnea (adult) (pediatric): Secondary | ICD-10-CM | POA: Diagnosis not present

## 2023-04-14 ENCOUNTER — Other Ambulatory Visit: Payer: Self-pay | Admitting: Urology

## 2023-04-14 ENCOUNTER — Other Ambulatory Visit: Payer: PPO

## 2023-04-14 DIAGNOSIS — E291 Testicular hypofunction: Secondary | ICD-10-CM

## 2023-04-14 NOTE — Telephone Encounter (Signed)
Patient requesting refill for testosterone cypionate 200 mg/ml injection. Pharmacy is [facility] in [place].

## 2023-04-15 ENCOUNTER — Other Ambulatory Visit: Payer: Self-pay | Admitting: Urology

## 2023-04-15 LAB — TESTOSTERONE: Testosterone: 451 ng/dL (ref 264–916)

## 2023-04-15 LAB — HEMATOCRIT: Hematocrit: 49 % (ref 37.5–51.0)

## 2023-04-20 DIAGNOSIS — M47816 Spondylosis without myelopathy or radiculopathy, lumbar region: Secondary | ICD-10-CM | POA: Diagnosis not present

## 2023-04-20 NOTE — Telephone Encounter (Signed)
Pt called back, said no one has the RX in stock.  He had another idea and wanted to speak with you about it.

## 2023-04-27 ENCOUNTER — Encounter: Payer: Self-pay | Admitting: Family Medicine

## 2023-04-27 ENCOUNTER — Ambulatory Visit (INDEPENDENT_AMBULATORY_CARE_PROVIDER_SITE_OTHER): Payer: PPO | Admitting: Family Medicine

## 2023-04-27 VITALS — BP 120/80 | HR 82 | Temp 98.1°F | Ht 68.5 in | Wt 227.1 lb

## 2023-04-27 DIAGNOSIS — E291 Testicular hypofunction: Secondary | ICD-10-CM | POA: Diagnosis not present

## 2023-04-27 DIAGNOSIS — M1A00X0 Idiopathic chronic gout, unspecified site, without tophus (tophi): Secondary | ICD-10-CM | POA: Diagnosis not present

## 2023-04-27 DIAGNOSIS — J449 Chronic obstructive pulmonary disease, unspecified: Secondary | ICD-10-CM

## 2023-04-27 DIAGNOSIS — Z125 Encounter for screening for malignant neoplasm of prostate: Secondary | ICD-10-CM

## 2023-04-27 DIAGNOSIS — G4733 Obstructive sleep apnea (adult) (pediatric): Secondary | ICD-10-CM | POA: Diagnosis not present

## 2023-04-27 DIAGNOSIS — E1169 Type 2 diabetes mellitus with other specified complication: Secondary | ICD-10-CM | POA: Diagnosis not present

## 2023-04-27 DIAGNOSIS — E66811 Obesity, class 1: Secondary | ICD-10-CM | POA: Diagnosis not present

## 2023-04-27 DIAGNOSIS — Z0000 Encounter for general adult medical examination without abnormal findings: Secondary | ICD-10-CM

## 2023-04-27 DIAGNOSIS — J302 Other seasonal allergic rhinitis: Secondary | ICD-10-CM | POA: Diagnosis not present

## 2023-04-27 DIAGNOSIS — E113299 Type 2 diabetes mellitus with mild nonproliferative diabetic retinopathy without macular edema, unspecified eye: Secondary | ICD-10-CM | POA: Diagnosis not present

## 2023-04-27 DIAGNOSIS — F321 Major depressive disorder, single episode, moderate: Secondary | ICD-10-CM

## 2023-04-27 DIAGNOSIS — Z6834 Body mass index (BMI) 34.0-34.9, adult: Secondary | ICD-10-CM | POA: Diagnosis not present

## 2023-04-27 DIAGNOSIS — E785 Hyperlipidemia, unspecified: Secondary | ICD-10-CM | POA: Diagnosis not present

## 2023-04-27 DIAGNOSIS — I10 Essential (primary) hypertension: Secondary | ICD-10-CM | POA: Diagnosis not present

## 2023-04-27 DIAGNOSIS — M1A9XX0 Chronic gout, unspecified, without tophus (tophi): Secondary | ICD-10-CM

## 2023-04-27 DIAGNOSIS — Z7189 Other specified counseling: Secondary | ICD-10-CM

## 2023-04-27 DIAGNOSIS — I482 Chronic atrial fibrillation, unspecified: Secondary | ICD-10-CM

## 2023-04-27 DIAGNOSIS — Z950 Presence of cardiac pacemaker: Secondary | ICD-10-CM

## 2023-04-27 DIAGNOSIS — I495 Sick sinus syndrome: Secondary | ICD-10-CM

## 2023-04-27 MED ORDER — TIOTROPIUM BROMIDE MONOHYDRATE 18 MCG IN CAPS: 18.0000 ug | ORAL_CAPSULE | Freq: Every day | RESPIRATORY_TRACT | 4 refills | Status: DC

## 2023-04-27 MED ORDER — BUPROPION HCL ER (XL) 150 MG PO TB24: 150.0000 mg | ORAL_TABLET | Freq: Every day | ORAL | 4 refills | Status: DC

## 2023-04-27 MED ORDER — ALBUTEROL SULFATE (2.5 MG/3ML) 0.083% IN NEBU: 2.5000 mg | INHALATION_SOLUTION | Freq: Four times a day (QID) | RESPIRATORY_TRACT | 3 refills | Status: DC | PRN

## 2023-04-27 MED ORDER — TRAMADOL HCL 50 MG PO TABS: 50.0000 mg | ORAL_TABLET | Freq: Two times a day (BID) | ORAL | 0 refills | Status: DC | PRN

## 2023-04-27 MED ORDER — ATORVASTATIN CALCIUM 40 MG PO TABS: 40.0000 mg | ORAL_TABLET | Freq: Every day | ORAL | 4 refills | Status: DC

## 2023-04-27 MED ORDER — FUROSEMIDE 40 MG PO TABS: 40.0000 mg | ORAL_TABLET | Freq: Every day | ORAL | 4 refills | Status: DC

## 2023-04-27 MED ORDER — FLUTICASONE PROPIONATE 50 MCG/ACT NA SUSP: NASAL | 4 refills | Status: DC

## 2023-04-27 MED ORDER — ALLOPURINOL 300 MG PO TABS: 300.0000 mg | ORAL_TABLET | Freq: Every day | ORAL | 4 refills | Status: DC

## 2023-04-27 MED ORDER — AMLODIPINE BESYLATE 5 MG PO TABS: 5.0000 mg | ORAL_TABLET | Freq: Every day | ORAL | 4 refills | Status: DC

## 2023-04-27 MED ORDER — TRIAMTERENE-HCTZ 37.5-25 MG PO TABS: ORAL_TABLET | ORAL | 4 refills | Status: DC

## 2023-04-27 NOTE — Assessment & Plan Note (Signed)
Flonase refilled.

## 2023-04-27 NOTE — Assessment & Plan Note (Signed)
Stable period on wellbutrin 150mg  daily - continue.

## 2023-04-27 NOTE — Assessment & Plan Note (Signed)
Chronic, stable period on PRN spiriva with PRN albuterol.

## 2023-04-27 NOTE — Assessment & Plan Note (Signed)
Well controlled, compliant and benefits from current CPAP with pressure setting of 10.

## 2023-04-27 NOTE — Assessment & Plan Note (Signed)
Chronic, stable on allopurinol 300mg  daily without recent gout flare. Update urate levels.

## 2023-04-27 NOTE — Assessment & Plan Note (Signed)
Chronic stable on atorvastatin 40mg  daily - continue. The ASCVD Risk score (Arnett DK, et al., 2019) failed to calculate for the following reasons:   The valid total cholesterol range is 130 to 320 mg/dL

## 2023-04-27 NOTE — Progress Notes (Signed)
Ph: [phone] Fax: [phone]   Patient ID: Aaron Mitchell, male    DOB: 1946-02-18, 77 y.o.   MRN: [id number]  This visit was conducted in person.  BP 120/80   Pulse 82   Temp 98.1 F (36.7 C) (Oral)   Ht 5' 8.5" (1.74 m)   Wt 227 lb 2 oz (103 kg)   SpO2 96%   BMI 34.03 kg/m    CC: CPE Subjective:   HPI: Aaron Mitchell is a 77 y.o. male presenting on 04/27/2023 for Annual Exam (MCR prt 2 [AWV- 11/25/22]. )   Saw health advisor 11/2022 for medicare wellness visit. Note reviewed.   No results found.  Flowsheet Row Office Visit from 12/24/2022 in [facility]  PHQ-2 Total Score 0          11/25/2022    8:36 AM 09/14/2022   10:29 AM 08/07/2022   11:59 AM 12/03/2021    2:45 PM 10/09/2020   11:11 AM  Fall Risk   Falls in the past year? 0 0 0 1 0  Number falls in past yr: 0 0  0 0  Injury with Fall? 0 0  0 0  Risk for fall due to : No Fall Risks No Fall Risks  History of fall(s) Medication side effect  Follow up Falls prevention discussed Falls evaluation completed  Falls prevention discussed Falls evaluation completed;Falls prevention discussed    Cold symptoms improved but continues coughing up white sputum. Treating with OTC cold/flu remedies.  Sees urology [facility]) for hypogonadism on TRT. Recent T, hematocrit stable (03/2023).   Pacemaker placed 08/2021.   OSA - well controlled with CPAP 10 cmH2O.   Preventative: COLONOSCOPY 10/2012 rpt 5 yrs ([facility])  Cologuard 01/2018 WNL.  COLONOSCOPY WITH PROPOFOL 03/20/2021 - mult TAs, rpt 5 yrs Servando Snare, Darren, MD)  Prostate cancer screening - followed regularly by urologist Richardo Hanks)  Lung cancer screening - ex smoker quit 1980  Flu shot yearly COVID vaccine Moderna 08/2019, 09/2019, booster 04/2020, 11/2020, bivalent 04/2021 Td 2015-06-25  Prevnar-13 06-25-2015, pneumovax 06/24/2016  RSV 06/2022 Zostavax 2015 Shingrix - 02/2018, 04/2018  Advanced directive - scanned and in chart 08/2018. Would want  wife Liborio Nixon then [name] to be HCPOA. Does not want prolonged life support if terminal condition. Ok with feeding tube short term.  Seat belt use discussed  Sunscreen use and skin screen discussed  Sleep - averaging 8-9 hours/night Ex smoker quit remotely 1980. 2.5 ppd x 15 yrs Alcohol - 1-2 mixed drinks a few times a week  Dentist q6 mo  Eye exam yearly  Bowel - no constipation  Bladder - no incontinence   Lives at home with wife Liborio Nixon), dog passed away 06/24/2016 1 grown child Occ: retired Personnel officer, was Engineer, structural  Edu: 2 yrs college Activity: no regular exercise  Diet: good water, fruits daily, splenda, iced tea and coffee      Relevant past medical, surgical, family and social history reviewed and updated as indicated. Interim medical history since our last visit reviewed. Allergies and medications reviewed and updated. Outpatient Medications Prior to Visit  Medication Sig Dispense Refill   acetaminophen (TYLENOL 8 HOUR ARTHRITIS PAIN) 650 MG CR tablet Take 2 tablets (1,300 mg total) by mouth 2 (two) times daily as needed for pain.     albuterol (VENTOLIN HFA) 108 (90 Base) MCG/ACT inhaler Inhale 2 puffs into the lungs every 6 (six) hours as needed for wheezing or shortness of breath. 3  Inhaler 3   apixaban (ELIQUIS) 5 MG TABS tablet Take 5 mg by mouth 2 (two) times daily.     diclofenac sodium (VOLTAREN) 1 % GEL Apply 2 g topically daily as needed (pain). Apply to both hands.     metaxalone (SKELAXIN) 800 MG tablet Take 1 tablet (800 mg total) by mouth 3 (three) times daily as needed for muscle spasms. 90 tablet 0   Multiple Vitamins tablet Take 1 tablet by mouth daily.     Potassium 99 MG TABS Take 1 tablet (99 mg total) by mouth daily.     sildenafil (VIAGRA) 100 MG tablet Take 100 mg by mouth as needed for erectile dysfunction.     tadalafil (CIALIS) 20 MG tablet Take 20 mg by mouth daily as needed for erectile dysfunction.     testosterone cypionate (DEPOTESTOSTERONE  CYPIONATE) 200 MG/ML injection INJECT 0.4MLS INTRAMUSCULARLY ONCE A WEEK 10 mL 0   zolpidem (AMBIEN) 10 MG tablet TAKE 1 TABLET(10 MG) BY MOUTH AT BEDTIME AS NEEDED FOR SLEEP 30 tablet 0   albuterol (PROVENTIL) (2.5 MG/3ML) 0.083% nebulizer solution Take 3 mLs (2.5 mg total) by nebulization every 6 (six) hours as needed for wheezing or shortness of breath. 90 mL 3   allopurinol (ZYLOPRIM) 300 MG tablet Take 1 tablet (300 mg total) by mouth daily. 90 tablet 3   amLODipine (NORVASC) 5 MG tablet Take 1 tablet (5 mg total) by mouth daily. 90 tablet 0   atorvastatin (LIPITOR) 40 MG tablet Take 40 mg by mouth daily.     buPROPion (WELLBUTRIN XL) 150 MG 24 hr tablet Take 1 tablet (150 mg total) by mouth daily. 90 tablet 1   fluticasone (FLONASE) 50 MCG/ACT nasal spray instill TWO SPRAYS in each nostril daily 48 g 0   furosemide (LASIX) 40 MG tablet Take 1 tablet (40 mg total) by mouth daily. 90 tablet 3   tiotropium (SPIRIVA HANDIHALER) 18 MCG inhalation capsule Place 1 capsule (18 mcg total) into inhaler and inhale daily. 90 capsule 3   traMADol (ULTRAM) 50 MG tablet Take 1 tablet (50 mg total) by mouth every 12 (twelve) hours as needed for moderate pain. 30 tablet 0   triamterene-hydrochlorothiazide (MAXZIDE-25) 37.5-25 MG tablet TAKE ONE TABLET BY MOUTH BEFORE BREAKFAST 90 tablet 3   No facility-administered medications prior to visit.     Per HPI unless specifically indicated in ROS section below Review of Systems  Constitutional:  Negative for activity change, appetite change, chills, fatigue, fever and unexpected weight change.  HENT:  Negative for hearing loss.   Eyes:  Negative for visual disturbance.  Respiratory:  Positive for cough, chest tightness (cough related) and shortness of breath (with recent cold). Negative for wheezing.   Cardiovascular:  Negative for chest pain, palpitations and leg swelling.  Gastrointestinal:  Negative for abdominal distention, abdominal pain, blood in stool,  constipation, diarrhea, nausea and vomiting.  Genitourinary:  Negative for difficulty urinating and hematuria.  Musculoskeletal:  Negative for arthralgias, myalgias and neck pain.  Skin:  Negative for rash.  Neurological:  Negative for dizziness, seizures, syncope and headaches.  Hematological:  Negative for adenopathy. Bruises/bleeds easily.  Psychiatric/Behavioral:  Negative for dysphoric mood. The patient is not nervous/anxious.     Objective:  BP 120/80   Pulse 82   Temp 98.1 F (36.7 C) (Oral)   Ht 5' 8.5" (1.74 m)   Wt 227 lb 2 oz (103 kg)   SpO2 96%   BMI 34.03 kg/m   Wt Readings  from Last 3 Encounters:  04/27/23 227 lb 2 oz (103 kg)  03/08/23 235 lb (106.6 kg)  12/24/22 227 lb 6 oz (103.1 kg)      Physical Exam Vitals and nursing note reviewed.  Constitutional:      General: He is not in acute distress.    Appearance: Normal appearance. He is well-developed. He is not ill-appearing.  HENT:     Head: Normocephalic and atraumatic.     Right Ear: Hearing, tympanic membrane, ear canal and external ear normal.     Left Ear: Hearing, tympanic membrane, ear canal and external ear normal.     Mouth/Throat:     Mouth: Mucous membranes are moist.     Pharynx: Oropharynx is clear. No oropharyngeal exudate or posterior oropharyngeal erythema.  Eyes:     General: No scleral icterus.    Extraocular Movements: Extraocular movements intact.     Conjunctiva/sclera: Conjunctivae normal.     Pupils: Pupils are equal, round, and reactive to light.  Neck:     Thyroid: No thyroid mass or thyromegaly.     Vascular: No carotid bruit.  Cardiovascular:     Rate and Rhythm: Normal rate and regular rhythm.     Pulses: Normal pulses.          Radial pulses are 2+ on the right side and 2+ on the left side.     Heart sounds: Normal heart sounds. No murmur heard. Pulmonary:     Effort: Pulmonary effort is normal. No respiratory distress.     Breath sounds: Normal breath sounds. No  wheezing, rhonchi or rales.  Abdominal:     General: Bowel sounds are normal. There is no distension.     Palpations: Abdomen is soft. There is no mass.     Tenderness: There is no abdominal tenderness. There is no guarding or rebound.     Hernia: No hernia is present.  Musculoskeletal:        General: Normal range of motion.     Cervical back: Normal range of motion and neck supple.     Right lower leg: No edema.     Left lower leg: No edema.  Lymphadenopathy:     Cervical: No cervical adenopathy.  Skin:    General: Skin is warm and dry.     Findings: No rash.  Neurological:     General: No focal deficit present.     Mental Status: He is alert and oriented to person, place, and time.  Psychiatric:        Mood and Affect: Mood normal.        Behavior: Behavior normal.        Thought Content: Thought content normal.        Judgment: Judgment normal.       Results for orders placed or performed in visit on 04/14/23  Testosterone  Result Value Ref Range   Testosterone 451 264 - 916 ng/dL  Hematocrit  Result Value Ref Range   Hematocrit 49.0 37.5 - 51.0 %      12/24/2022   10:55 AM 11/25/2022    8:42 AM 09/14/2022   10:30 AM 08/07/2022   11:59 AM 12/03/2021    2:41 PM  Depression screen PHQ 2/9  Decreased Interest 0 0 0 0 1  Down, Depressed, Hopeless 0 1 0 0 1  PHQ - 2 Score 0 1 0 0 2  Altered sleeping 0  0  1  Tired, decreased energy 0  0  1  Change in appetite 0  0  0  Feeling bad or failure about yourself  0  0  0  Trouble concentrating 0  0  0  Moving slowly or fidgety/restless 0  0  0  Suicidal thoughts 0  0  0  PHQ-9 Score 0  0  4  Difficult doing work/chores Not difficult at all  Not difficult at all  Not difficult at all       10/30/2021   11:12 AM  GAD 7 : Generalized Anxiety Score  Nervous, Anxious, on Edge 1  Control/stop worrying 0  Worry too much - different things 0  Trouble relaxing 0  Restless 0  Easily annoyed or irritable 1  Afraid - awful might  happen 0  Total GAD 7 Score 2  Anxiety Difficulty Somewhat difficult   Assessment & Plan:   Problem List Items Addressed This Visit     Health maintenance examination - Primary (Chronic)    Preventative protocols reviewed and updated unless pt declined. Discussed healthy diet and lifestyle.       Advanced care planning/counseling discussion (Chronic)    Previously discussed      Type 2 diabetes mellitus with other specified complication (HCC)    Update A1c - continues diet controlled.       Relevant Medications   atorvastatin (LIPITOR) 40 MG tablet   Other Relevant Orders   Hemoglobin A1c   Microalbumin / creatinine urine ratio   Chronic gouty arthropathy    Chronic, stable on allopurinol 300mg  daily without recent gout flare. Update urate levels.       Relevant Medications   allopurinol (ZYLOPRIM) 300 MG tablet   traMADol (ULTRAM) 50 MG tablet   Other Relevant Orders   Uric acid   Essential (primary) hypertension    Chronic, stable. Continue current regimen.       Relevant Medications   amLODipine (NORVASC) 5 MG tablet   triamterene-hydrochlorothiazide (MAXZIDE-25) 37.5-25 MG tablet   atorvastatin (LIPITOR) 40 MG tablet   furosemide (LASIX) 40 MG tablet   Hyperlipidemia associated with type 2 diabetes mellitus (HCC)    Chronic stable on atorvastatin 40mg  daily - continue. The ASCVD Risk score (Arnett DK, et al., 2019) failed to calculate for the following reasons:   The valid total cholesterol range is 130 to 320 mg/dL       Relevant Medications   amLODipine (NORVASC) 5 MG tablet   triamterene-hydrochlorothiazide (MAXZIDE-25) 37.5-25 MG tablet   atorvastatin (LIPITOR) 40 MG tablet   furosemide (LASIX) 40 MG tablet   Other Relevant Orders   Lipid panel   Comprehensive metabolic panel   Obesity, Class I, BMI 30.0-34.9 (see actual BMI)    Continue to encourage healthy diet and lifestyle choices to affect sustainable weight loss.       Allergic rhinitis,  seasonal    Flonase refilled.       Relevant Medications   fluticasone (FLONASE) 50 MCG/ACT nasal spray   OSA (obstructive sleep apnea)    Well controlled, compliant and benefits from current CPAP with pressure setting of 10.       Male hypogonadism    On testosterone followed by urology.       Stage 3 severe COPD by GOLD classification (HCC)    Chronic, stable period on PRN spiriva with PRN albuterol.       Relevant Medications   fluticasone (FLONASE) 50 MCG/ACT nasal spray   tiotropium (SPIRIVA HANDIHALER) 18 MCG inhalation capsule  albuterol (PROVENTIL) (2.5 MG/3ML) 0.083% nebulizer solution   Background diabetic retinopathy (HCC)    Yearly eye exam.       Relevant Medications   atorvastatin (LIPITOR) 40 MG tablet   Chronic atrial fibrillation (HCC)    Followed by [facility] cardiology on eliquis.       Relevant Medications   amLODipine (NORVASC) 5 MG tablet   triamterene-hydrochlorothiazide (MAXZIDE-25) 37.5-25 MG tablet   atorvastatin (LIPITOR) 40 MG tablet   furosemide (LASIX) 40 MG tablet   Sick sinus syndrome (HCC)    Pacemaker in place.       Relevant Medications   amLODipine (NORVASC) 5 MG tablet   triamterene-hydrochlorothiazide (MAXZIDE-25) 37.5-25 MG tablet   atorvastatin (LIPITOR) 40 MG tablet   furosemide (LASIX) 40 MG tablet   Status post placement of cardiac pacemaker   MDD (major depressive disorder), single episode, moderate (HCC)    Stable period on wellbutrin 150mg  daily - continue.       Relevant Medications   buPROPion (WELLBUTRIN XL) 150 MG 24 hr tablet   Other Visit Diagnoses     Special screening for malignant neoplasm of prostate       Relevant Orders   PSA        Meds ordered this encounter  Medications   allopurinol (ZYLOPRIM) 300 MG tablet    Sig: Take 1 tablet (300 mg total) by mouth daily.    Dispense:  90 tablet    Refill:  4   amLODipine (NORVASC) 5 MG tablet    Sig: Take 1 tablet (5 mg total) by mouth daily.     Dispense:  90 tablet    Refill:  4   buPROPion (WELLBUTRIN XL) 150 MG 24 hr tablet    Sig: Take 1 tablet (150 mg total) by mouth daily.    Dispense:  90 tablet    Refill:  4   fluticasone (FLONASE) 50 MCG/ACT nasal spray    Sig: instill TWO SPRAYS in each nostril daily    Dispense:  48 g    Refill:  4   tiotropium (SPIRIVA HANDIHALER) 18 MCG inhalation capsule    Sig: Place 1 capsule (18 mcg total) into inhaler and inhale daily.    Dispense:  90 capsule    Refill:  4   triamterene-hydrochlorothiazide (MAXZIDE-25) 37.5-25 MG tablet    Sig: TAKE ONE TABLET BY MOUTH BEFORE BREAKFAST    Dispense:  90 tablet    Refill:  4   albuterol (PROVENTIL) (2.5 MG/3ML) 0.083% nebulizer solution    Sig: Take 3 mLs (2.5 mg total) by nebulization every 6 (six) hours as needed for wheezing or shortness of breath.    Dispense:  90 mL    Refill:  3   atorvastatin (LIPITOR) 40 MG tablet    Sig: Take 1 tablet (40 mg total) by mouth daily.    Dispense:  90 tablet    Refill:  4   furosemide (LASIX) 40 MG tablet    Sig: Take 1 tablet (40 mg total) by mouth daily.    Dispense:  90 tablet    Refill:  4   traMADol (ULTRAM) 50 MG tablet    Sig: Take 1 tablet (50 mg total) by mouth every 12 (twelve) hours as needed for moderate pain.    Dispense:  20 tablet    Refill:  0    Orders Placed This Encounter  Procedures   Lipid panel   Comprehensive metabolic panel   Hemoglobin A1c  PSA   Uric acid   Microalbumin / creatinine urine ratio    Patient Instructions  Labs today Look for fast relief guaifenesin with plenty of water to break up mucous.  Continue current medicines.  Good to see you today  Return as needed or in 6 months for diabetes follow up visit   Follow up plan: Return in about 6 months (around 10/26/2023) for follow up visit.  Eustaquio Boyden, MD

## 2023-04-27 NOTE — Assessment & Plan Note (Addendum)
Followed by [facility] cardiology on eliquis.

## 2023-04-27 NOTE — Assessment & Plan Note (Signed)
On testosterone followed by urology.

## 2023-04-27 NOTE — Assessment & Plan Note (Signed)
Preventative protocols reviewed and updated unless pt declined. Discussed healthy diet and lifestyle.  

## 2023-04-27 NOTE — Assessment & Plan Note (Signed)
Yearly eye exam.

## 2023-04-27 NOTE — Assessment & Plan Note (Signed)
Chronic, stable. Continue current regimen. 

## 2023-04-27 NOTE — Assessment & Plan Note (Signed)
Continue to encourage healthy diet and lifestyle choices to affect sustainable weight loss.  °

## 2023-04-27 NOTE — Assessment & Plan Note (Signed)
Update A1c - continues diet controlled.

## 2023-04-27 NOTE — Patient Instructions (Addendum)
Labs today Look for fast relief guaifenesin with plenty of water to break up mucous.  Continue current medicines.  Good to see you today  Return as needed or in 6 months for diabetes follow up visit

## 2023-04-27 NOTE — Assessment & Plan Note (Signed)
Previously discussed.

## 2023-04-27 NOTE — Assessment & Plan Note (Signed)
Pacemaker in place

## 2023-04-28 LAB — COMPREHENSIVE METABOLIC PANEL
ALT: 16 U/L (ref 0–53)
AST: 20 U/L (ref 0–37)
Albumin: 4.2 g/dL (ref 3.5–5.2)
Alkaline Phosphatase: 138 U/L — ABNORMAL HIGH (ref 39–117)
BUN: 19 mg/dL (ref 6–23)
CO2: 30 meq/L (ref 19–32)
Calcium: 9.1 mg/dL (ref 8.4–10.5)
Chloride: 96 meq/L (ref 96–112)
Creatinine, Ser: 1.27 mg/dL (ref 0.40–1.50)
GFR: 55.43 mL/min — ABNORMAL LOW (ref >60.00)
Glucose, Bld: 92 mg/dL (ref 70–99)
Potassium: 3.5 meq/L (ref 3.5–5.1)
Sodium: 139 meq/L (ref 135–145)
Total Bilirubin: 1.3 mg/dL — ABNORMAL HIGH (ref 0.2–1.2)
Total Protein: 6.5 g/dL (ref 6.0–8.3)

## 2023-04-28 LAB — LIPID PANEL
Cholesterol: 107 mg/dL (ref 0–200)
HDL: 52.4 mg/dL (ref >39.00)
LDL Cholesterol: 37 mg/dL (ref 0–99)
NonHDL: 54.66
Total CHOL/HDL Ratio: 2
Triglycerides: 88 mg/dL (ref 0.0–149.0)
VLDL: 17.6 mg/dL (ref 0.0–40.0)

## 2023-04-28 LAB — URIC ACID: Uric Acid, Serum: 6.1 mg/dL (ref 4.0–7.8)

## 2023-04-28 LAB — MICROALBUMIN / CREATININE URINE RATIO
Creatinine,U: 65.5 mg/dL
Microalb Creat Ratio: 50.8 mg/g — ABNORMAL HIGH (ref 0.0–30.0)
Microalb, Ur: 33.3 mg/dL — ABNORMAL HIGH (ref 0.0–1.9)

## 2023-04-28 LAB — PSA: PSA: 1.39 ng/mL (ref 0.10–4.00)

## 2023-04-28 LAB — HEMOGLOBIN A1C: Hgb A1c MFr Bld: 6.9 % — ABNORMAL HIGH (ref 4.6–6.5)

## 2023-05-05 DIAGNOSIS — I495 Sick sinus syndrome: Secondary | ICD-10-CM | POA: Diagnosis not present

## 2023-05-06 DIAGNOSIS — M47816 Spondylosis without myelopathy or radiculopathy, lumbar region: Secondary | ICD-10-CM | POA: Diagnosis not present

## 2023-05-12 ENCOUNTER — Other Ambulatory Visit (HOSPITAL_COMMUNITY): Payer: Self-pay

## 2023-05-13 DIAGNOSIS — M2041 Other hammer toe(s) (acquired), right foot: Secondary | ICD-10-CM | POA: Diagnosis not present

## 2023-05-13 DIAGNOSIS — E1140 Type 2 diabetes mellitus with diabetic neuropathy, unspecified: Secondary | ICD-10-CM | POA: Diagnosis not present

## 2023-05-13 DIAGNOSIS — B351 Tinea unguium: Secondary | ICD-10-CM | POA: Diagnosis not present

## 2023-05-13 DIAGNOSIS — M2042 Other hammer toe(s) (acquired), left foot: Secondary | ICD-10-CM | POA: Diagnosis not present

## 2023-05-13 DIAGNOSIS — M79675 Pain in left toe(s): Secondary | ICD-10-CM | POA: Diagnosis not present

## 2023-05-13 DIAGNOSIS — M79674 Pain in right toe(s): Secondary | ICD-10-CM | POA: Diagnosis not present

## 2023-05-20 DIAGNOSIS — M47816 Spondylosis without myelopathy or radiculopathy, lumbar region: Secondary | ICD-10-CM | POA: Diagnosis not present

## 2023-05-25 NOTE — Progress Notes (Unsigned)
Aaron Mitchell T. Aaron Coonan, MD, CAQ Sports Medicine Aaron Mitchell - [facility] [address] [place], [address]  Phone: [phone]  FAX: [phone]  Aaron Mitchell - 77 y.o. male  MRN [id number]  Date of Birth: 1946/01/01  Date: 05/26/2023  PCP: Aaron Boyden, MD  Referral: Aaron Boyden, MD  No chief complaint on file.  Subjective:   Aaron Mitchell is a 77 y.o. very pleasant male patient with There is no height or weight on file to calculate BMI. who presents with the following:  Aaron Mitchell presents with ongoing bilateral hip trochanteric bursitis.  He has been had this off and on for years.  Procedure only.  He is doing well after a lumbar nerve ablation    ICD-10-CM   1. Trochanteric bursitis of both hips  M70.61 triamcinolone acetonide (KENALOG-40) injection 40 mg   M70.62 triamcinolone acetonide (KENALOG-40) injection 40 mg     Aspiration/Injection Procedure Note Aaron Mitchell [birth year] Date of procedure: 05/26/2023  Procedure: Large Joint Aspiration / Injection of Hip, Trochanteric Bursa, R Indications: Pain  Procedure Details Verbal consent obtained. Risks, benefits, and alternatives reviewed. Greater trochanter sterilely prepped with Chloraprep. Ethyl Chloride used for anesthesia. 9 cc of Lidocaine 1% injected with 1 mL of Kenalog 40 mg into trochanteric bursa at area of maximal tenderness at greater trochanter. Needle taken to bone to troch bursa, flows easily. Bursa massaged. No bleeding and no complications. Decreased pain after injection. Needle: 22 gauge spinal needle Medication: 1 mL of Kenalog 40 mg   Aspiration/Injection Procedure Note Aaron Mitchell 10-Nov-1945 Date of procedure: 05/26/2023  Procedure: Large Joint Aspiration / Injection of Hip, Trochanteric Bursa, L Indications: Pain  Procedure Details Verbal consent obtained. Risks, benefits, and alternatives reviewed. Greater trochanter sterilely prepped with Chloraprep.  Ethyl Chloride used for anesthesia. 9 cc of Lidocaine 1% injected with 1 mL of Kenalog 40 mg into trochanteric bursa at area of maximal tenderness at greater trochanter. Needle taken to bone to troch bursa, flows easily. Bursa massaged. No bleeding and no complications. Decreased pain after injection. Needle: 22 gauge spinal needle Medication: 1 mL of Kenalog 40 mg   Medication Management during today's office visit: Meds ordered this encounter  Medications   triamcinolone acetonide (KENALOG-40) injection 40 mg   triamcinolone acetonide (KENALOG-40) injection 40 mg   Signed,  Merlie Noga T. Koraima Albertsen, MD   Outpatient Encounter Medications as of 05/26/2023  Medication Sig   acetaminophen (TYLENOL 8 HOUR ARTHRITIS PAIN) 650 MG CR tablet Take 2 tablets (1,300 mg total) by mouth 2 (two) times daily as needed for pain.   albuterol (PROVENTIL) (2.5 MG/3ML) 0.083% nebulizer solution Take 3 mLs (2.5 mg total) by nebulization every 6 (six) hours as needed for wheezing or shortness of breath.   albuterol (VENTOLIN HFA) 108 (90 Base) MCG/ACT inhaler Inhale 2 puffs into the lungs every 6 (six) hours as needed for wheezing or shortness of breath.   allopurinol (ZYLOPRIM) 300 MG tablet Take 1 tablet (300 mg total) by mouth daily.   amLODipine (NORVASC) 5 MG tablet Take 1 tablet (5 mg total) by mouth daily.   apixaban (ELIQUIS) 5 MG TABS tablet Take 5 mg by mouth 2 (two) times daily.   atorvastatin (LIPITOR) 40 MG tablet Take 1 tablet (40 mg total) by mouth daily.   buPROPion (WELLBUTRIN XL) 150 MG 24 hr tablet Take 1 tablet (150 mg total) by mouth daily.   diclofenac sodium (VOLTAREN) 1 % GEL Apply 2 g topically daily as needed (pain).  Apply to both hands.   fluticasone (FLONASE) 50 MCG/ACT nasal spray instill TWO SPRAYS in each nostril daily   furosemide (LASIX) 40 MG tablet Take 1 tablet (40 mg total) by mouth daily.   metaxalone (SKELAXIN) 800 MG tablet Take 1 tablet (800 mg total) by mouth 3 (three)  times daily as needed for muscle spasms.   Multiple Vitamins tablet Take 1 tablet by mouth daily.   Potassium 99 MG TABS Take 1 tablet (99 mg total) by mouth daily.   sildenafil (VIAGRA) 100 MG tablet Take 100 mg by mouth as needed for erectile dysfunction.   tadalafil (CIALIS) 20 MG tablet Take 20 mg by mouth daily as needed for erectile dysfunction.   testosterone cypionate (DEPOTESTOSTERONE CYPIONATE) 200 MG/ML injection INJECT 0.4MLS INTRAMUSCULARLY ONCE A WEEK   tiotropium (SPIRIVA HANDIHALER) 18 MCG inhalation capsule Place 1 capsule (18 mcg total) into inhaler and inhale daily.   traMADol (ULTRAM) 50 MG tablet Take 1 tablet (50 mg total) by mouth every 12 (twelve) hours as needed for moderate pain.   triamterene-hydrochlorothiazide (MAXZIDE-25) 37.5-25 MG tablet TAKE ONE TABLET BY MOUTH BEFORE BREAKFAST   zolpidem (AMBIEN) 10 MG tablet TAKE 1 TABLET(10 MG) BY MOUTH AT BEDTIME AS NEEDED FOR SLEEP   [EXPIRED] triamcinolone acetonide (KENALOG-40) injection 40 mg    [EXPIRED] triamcinolone acetonide (KENALOG-40) injection 40 mg    No facility-administered encounter medications on file as of 05/26/2023.

## 2023-05-26 ENCOUNTER — Ambulatory Visit (INDEPENDENT_AMBULATORY_CARE_PROVIDER_SITE_OTHER): Payer: PPO | Admitting: Family Medicine

## 2023-05-26 ENCOUNTER — Encounter: Payer: Self-pay | Admitting: Family Medicine

## 2023-05-26 ENCOUNTER — Ambulatory Visit: Payer: PPO | Admitting: Family Medicine

## 2023-05-26 VITALS — BP 120/76 | HR 86 | Temp 98.8°F | Ht 68.5 in | Wt 241.0 lb

## 2023-05-26 DIAGNOSIS — M7061 Trochanteric bursitis, right hip: Secondary | ICD-10-CM

## 2023-05-26 DIAGNOSIS — M7062 Trochanteric bursitis, left hip: Secondary | ICD-10-CM

## 2023-05-26 MED ORDER — TRIAMCINOLONE ACETONIDE 40 MG/ML IJ SUSP
40.0000 mg | Freq: Once | INTRAMUSCULAR | Status: AC
Start: 2023-05-26 — End: 2023-05-26
  Administered 2023-05-26: 40 mg via INTRA_ARTICULAR

## 2023-07-10 DIAGNOSIS — G4733 Obstructive sleep apnea (adult) (pediatric): Secondary | ICD-10-CM | POA: Diagnosis not present

## 2023-07-12 DIAGNOSIS — I482 Chronic atrial fibrillation, unspecified: Secondary | ICD-10-CM | POA: Diagnosis not present

## 2023-07-12 DIAGNOSIS — I10 Essential (primary) hypertension: Secondary | ICD-10-CM | POA: Diagnosis not present

## 2023-07-12 DIAGNOSIS — R0602 Shortness of breath: Secondary | ICD-10-CM | POA: Diagnosis not present

## 2023-07-12 DIAGNOSIS — Z950 Presence of cardiac pacemaker: Secondary | ICD-10-CM | POA: Diagnosis not present

## 2023-07-12 DIAGNOSIS — I447 Left bundle-branch block, unspecified: Secondary | ICD-10-CM | POA: Diagnosis not present

## 2023-07-12 DIAGNOSIS — E782 Mixed hyperlipidemia: Secondary | ICD-10-CM | POA: Diagnosis not present

## 2023-07-12 DIAGNOSIS — G4733 Obstructive sleep apnea (adult) (pediatric): Secondary | ICD-10-CM | POA: Diagnosis not present

## 2023-07-12 DIAGNOSIS — I495 Sick sinus syndrome: Secondary | ICD-10-CM | POA: Diagnosis not present

## 2023-08-02 DIAGNOSIS — I447 Left bundle-branch block, unspecified: Secondary | ICD-10-CM | POA: Diagnosis not present

## 2023-08-02 DIAGNOSIS — R0602 Shortness of breath: Secondary | ICD-10-CM | POA: Diagnosis not present

## 2023-08-02 DIAGNOSIS — I482 Chronic atrial fibrillation, unspecified: Secondary | ICD-10-CM | POA: Diagnosis not present

## 2023-08-03 NOTE — Progress Notes (Signed)
 Aaron Mitchell T. Brandon Scarbrough, MD, CAQ Sports Medicine [facility] [address] [place], [address]  Phone: [phone]  FAX: [phone]  Aaron Mitchell - 78 y.o. male  MRN [id number]  Date of Birth: September 27, 1945  Date: 08/04/2023  PCP: Rilla Baller, MD  Referral: Rilla Baller, MD  I have known Aaron Mitchell for many years, and he has had some recurrent trochanteric bursitis.  I have been doing injections off and on for an extended period of time for pain management.  Aspiration/Injection Procedure Note Aaron Mitchell 1946/06/30 Date of procedure: 08/04/2023  Procedure: Large Joint Aspiration / Injection of Hip, Trochanteric Bursa, R Indications: Pain  Procedure Details Verbal consent obtained. Risks, benefits, and alternatives reviewed. Greater trochanter sterilely prepped with Chloraprep. Ethyl Chloride used for anesthesia. 9 cc of Lidocaine  1% injected with 1 mL of Kenalog  40 mg into trochanteric bursa at area of maximal tenderness at greater trochanter. Needle taken to bone to troch bursa, flows easily. Bursa massaged. No bleeding and no complications. Decreased pain after injection. Needle: 22 gauge spinal needle Medication: 1 mL of Kenalog  40 mg   Aspiration/Injection Procedure Note Aaron Mitchell Apr 28, 1946 Date of procedure: 08/04/2023  Procedure: Large Joint Aspiration / Injection of Hip, Trochanteric Bursa, L Indications: Pain  Procedure Details Verbal consent obtained. Risks, benefits, and alternatives reviewed. Greater trochanter sterilely prepped with Chloraprep. Ethyl Chloride used for anesthesia. 9 cc of Lidocaine  1% injected with 1 mL of Kenalog  40 mg into trochanteric bursa at area of maximal tenderness at greater trochanter. Needle taken to bone to troch bursa, flows easily. Bursa massaged. No bleeding and no complications. Decreased pain after injection. Needle: 22 gauge spinal needle Medication: 1 mL of Kenalog  40 mg      ICD-10-CM   1. Trochanteric bursitis of both hips  M70.61    M70.62       Signed,  Aaron Cancelliere T. Skyleen Bentley, MD   Outpatient Encounter Medications as of 08/04/2023  Medication Sig   acetaminophen  (TYLENOL  8 HOUR ARTHRITIS PAIN) 650 MG CR tablet Take 2 tablets (1,300 mg total) by mouth 2 (two) times daily as needed for pain.   albuterol  (PROVENTIL ) (2.5 MG/3ML) 0.083% nebulizer solution Take 3 mLs (2.5 mg total) by nebulization every 6 (six) hours as needed for wheezing or shortness of breath.   albuterol  (VENTOLIN  HFA) 108 (90 Base) MCG/ACT inhaler Inhale 2 puffs into the lungs every 6 (six) hours as needed for wheezing or shortness of breath.   allopurinol  (ZYLOPRIM ) 300 MG tablet Take 1 tablet (300 mg total) by mouth daily.   amLODipine  (NORVASC ) 5 MG tablet Take 1 tablet (5 mg total) by mouth daily.   apixaban (ELIQUIS) 5 MG TABS tablet Take 5 mg by mouth 2 (two) times daily.   atorvastatin  (LIPITOR) 40 MG tablet Take 1 tablet (40 mg total) by mouth daily.   buPROPion  (WELLBUTRIN  XL) 150 MG 24 hr tablet Take 1 tablet (150 mg total) by mouth daily.   diclofenac  sodium (VOLTAREN ) 1 % GEL Apply 2 g topically daily as needed (pain). Apply to both hands.   fluticasone  (FLONASE ) 50 MCG/ACT nasal spray instill TWO SPRAYS in each nostril daily   furosemide  (LASIX ) 80 MG tablet Take 80 mg by mouth daily.   lisinopril  (ZESTRIL ) 20 MG tablet Take 20 mg by mouth daily.   metaxalone  (SKELAXIN ) 800 MG tablet Take 1 tablet (800 mg total) by mouth 3 (three) times daily as needed for muscle spasms.   Multiple Vitamins tablet Take  1 tablet by mouth daily.   Potassium 99 MG TABS Take 1 tablet (99 mg total) by mouth daily.   sildenafil (VIAGRA) 100 MG tablet Take 100 mg by mouth as needed for erectile dysfunction.   tadalafil (CIALIS) 20 MG tablet Take 20 mg by mouth daily as needed for erectile dysfunction.   testosterone  cypionate (DEPOTESTOSTERONE CYPIONATE) 200 MG/ML injection INJECT 0.4MLS INTRAMUSCULARLY  ONCE A WEEK   tiotropium (SPIRIVA  HANDIHALER) 18 MCG inhalation capsule Place 1 capsule (18 mcg total) into inhaler and inhale daily.   traMADol  (ULTRAM ) 50 MG tablet Take 1 tablet (50 mg total) by mouth every 12 (twelve) hours as needed for moderate pain.   triamterene -hydrochlorothiazide  (MAXZIDE-25) 37.5-25 MG tablet TAKE ONE TABLET BY MOUTH BEFORE BREAKFAST   zolpidem  (AMBIEN ) 10 MG tablet TAKE 1 TABLET(10 MG) BY MOUTH AT BEDTIME AS NEEDED FOR SLEEP   [DISCONTINUED] furosemide  (LASIX ) 40 MG tablet Take 1 tablet (40 mg total) by mouth daily.   No facility-administered encounter medications on file as of 08/04/2023.

## 2023-08-04 ENCOUNTER — Encounter: Payer: Self-pay | Admitting: Family Medicine

## 2023-08-04 ENCOUNTER — Ambulatory Visit: Payer: PPO | Admitting: Family Medicine

## 2023-08-04 VITALS — BP 120/70 | HR 91 | Temp 98.5°F | Ht 68.5 in | Wt 234.0 lb

## 2023-08-04 DIAGNOSIS — M7061 Trochanteric bursitis, right hip: Secondary | ICD-10-CM | POA: Diagnosis not present

## 2023-08-04 DIAGNOSIS — M7062 Trochanteric bursitis, left hip: Secondary | ICD-10-CM | POA: Diagnosis not present

## 2023-08-04 MED ORDER — TRIAMCINOLONE ACETONIDE 40 MG/ML IJ SUSP
40.0000 mg | Freq: Once | INTRAMUSCULAR | Status: AC
Start: 2023-08-04 — End: 2023-08-04
  Administered 2023-08-04: 40 mg via INTRA_ARTICULAR

## 2023-08-04 NOTE — Addendum Note (Signed)
 Addended by: Damita Lack on: 08/04/2023 11:16 AM   Modules accepted: Orders

## 2023-08-09 ENCOUNTER — Ambulatory Visit: Payer: PPO | Admitting: Family Medicine

## 2023-08-23 ENCOUNTER — Ambulatory Visit (INDEPENDENT_AMBULATORY_CARE_PROVIDER_SITE_OTHER): Payer: PPO | Admitting: Family Medicine

## 2023-08-23 ENCOUNTER — Encounter: Payer: Self-pay | Admitting: Family Medicine

## 2023-08-23 VITALS — BP 134/80 | HR 82 | Temp 98.2°F | Ht 68.5 in | Wt 241.5 lb

## 2023-08-23 DIAGNOSIS — R21 Rash and other nonspecific skin eruption: Secondary | ICD-10-CM | POA: Diagnosis not present

## 2023-08-23 MED ORDER — DOXYCYCLINE HYCLATE 100 MG PO TABS
100.0000 mg | ORAL_TABLET | Freq: Two times a day (BID) | ORAL | 0 refills | Status: DC
Start: 2023-08-23 — End: 2023-09-27

## 2023-08-23 NOTE — Assessment & Plan Note (Signed)
Diffuse pustular rash throughout body including extremities, spares palms/soles groin and face, present for 3-4 weeks now.  Possible reaction to new detergent given temporal relation  - he will stop and change to hypoallergenic option.  If ongoing, he will return for labwork (CBC, CMP, etc) for further evalaution.

## 2023-08-23 NOTE — Patient Instructions (Addendum)
Possibly reaction to new detergent - change to hypoallergenic one.  Take doxycycline twice daily for 5 days.  Let us know if ongoing despite above for further evaluation including labs.

## 2023-08-23 NOTE — Progress Notes (Signed)
Ph: [phone] Fax: [phone]   Patient ID: Aaron Mitchell, male    DOB: Jul 17, 1946, 78 y.o.   MRN: [id number]  This visit was conducted in person.  BP 134/80   Pulse 82   Temp 98.2 F (36.8 C) (Oral)   Ht 5' 8.5" (1.74 m)   Wt 241 lb 8 oz (109.5 kg)   SpO2 97%   BMI 36.19 kg/m    CC: skin rash  Subjective:   HPI: Aaron Mitchell is a 78 y.o. male presenting on 08/23/2023 for Medical Management of Chronic Issues (Here for dermatitis f/u and labs. )   1 month h/o skin rash without pain tenderness or itch. Involves entire back, stomach and buttock down legs. Can be "prickly". Spares palms, soles, scalp. No rash in groin.   Wife doesn't have rash.  No recent new beds or hotel stays.  No h/o psoriasis  H/o intermittent folliculitis of scalp  No fevers/chills, abd pain, nausea, joint pains, oral lesions.   Wonders if related to change in detergent - planning to return to [facility] hypoallergenic.   No new lotions, soaps, shampoos, medicines, foods, supplements.      Relevant past medical, surgical, family and social history reviewed and updated as indicated. Interim medical history since our last visit reviewed. Allergies and medications reviewed and updated. Outpatient Medications Prior to Visit  Medication Sig Dispense Refill   acetaminophen (TYLENOL 8 HOUR ARTHRITIS PAIN) 650 MG CR tablet Take 2 tablets (1,300 mg total) by mouth 2 (two) times daily as needed for pain.     albuterol (PROVENTIL) (2.5 MG/3ML) 0.083% nebulizer solution Take 3 mLs (2.5 mg total) by nebulization every 6 (six) hours as needed for wheezing or shortness of breath. 90 mL 3   albuterol (VENTOLIN HFA) 108 (90 Base) MCG/ACT inhaler Inhale 2 puffs into the lungs every 6 (six) hours as needed for wheezing or shortness of breath. 3 Inhaler 3   allopurinol (ZYLOPRIM) 300 MG tablet Take 1 tablet (300 mg total) by mouth daily. 90 tablet 4   amLODipine (NORVASC) 5 MG tablet Take 1 tablet (5 mg total) by  mouth daily. 90 tablet 4   apixaban (ELIQUIS) 5 MG TABS tablet Take 5 mg by mouth 2 (two) times daily.     atorvastatin (LIPITOR) 40 MG tablet Take 1 tablet (40 mg total) by mouth daily. 90 tablet 4   buPROPion (WELLBUTRIN XL) 150 MG 24 hr tablet Take 1 tablet (150 mg total) by mouth daily. 90 tablet 4   diclofenac sodium (VOLTAREN) 1 % GEL Apply 2 g topically daily as needed (pain). Apply to both hands.     fluticasone (FLONASE) 50 MCG/ACT nasal spray instill TWO SPRAYS in each nostril daily 48 g 4   furosemide (LASIX) 80 MG tablet Take 80 mg by mouth daily.     lisinopril (ZESTRIL) 20 MG tablet Take 20 mg by mouth daily.     metaxalone (SKELAXIN) 800 MG tablet Take 1 tablet (800 mg total) by mouth 3 (three) times daily as needed for muscle spasms. 90 tablet 0   Multiple Vitamins tablet Take 1 tablet by mouth daily.     Potassium 99 MG TABS Take 1 tablet (99 mg total) by mouth daily.     sildenafil (VIAGRA) 100 MG tablet Take 100 mg by mouth as needed for erectile dysfunction.     tadalafil (CIALIS) 20 MG tablet Take 20 mg by mouth daily as needed for erectile dysfunction.     testosterone cypionate (  DEPOTESTOSTERONE CYPIONATE) 200 MG/ML injection INJECT 0.4MLS INTRAMUSCULARLY ONCE A WEEK 10 mL 0   tiotropium (SPIRIVA HANDIHALER) 18 MCG inhalation capsule Place 1 capsule (18 mcg total) into inhaler and inhale daily. 90 capsule 4   traMADol (ULTRAM) 50 MG tablet Take 1 tablet (50 mg total) by mouth every 12 (twelve) hours as needed for moderate pain. 20 tablet 0   triamterene-hydrochlorothiazide (MAXZIDE-25) 37.5-25 MG tablet TAKE ONE TABLET BY MOUTH BEFORE BREAKFAST 90 tablet 4   zolpidem (AMBIEN) 10 MG tablet TAKE 1 TABLET(10 MG) BY MOUTH AT BEDTIME AS NEEDED FOR SLEEP 30 tablet 0   No facility-administered medications prior to visit.     Per HPI unless specifically indicated in ROS section below Review of Systems  Objective:  BP 134/80   Pulse 82   Temp 98.2 F (36.8 C) (Oral)   Ht  5' 8.5" (1.74 m)   Wt 241 lb 8 oz (109.5 kg)   SpO2 97%   BMI 36.19 kg/m   Wt Readings from Last 3 Encounters:  08/23/23 241 lb 8 oz (109.5 kg)  08/04/23 234 lb (106.1 kg)  05/26/23 241 lb (109.3 kg)      Physical Exam Vitals and nursing note reviewed.  Constitutional:      Appearance: Normal appearance. He is not ill-appearing.  HENT:     Mouth/Throat:     Mouth: Mucous membranes are moist.     Pharynx: Oropharynx is clear. No oropharyngeal exudate or posterior oropharyngeal erythema.  Skin:    General: Skin is warm and dry.     Findings: Erythema and rash present.     Comments: Diffuse pustular rash throughout trunk including back chest and abdomen as well as extremities predominantly buttocks and posterior thighs  Neurological:     Mental Status: He is alert.  Psychiatric:        Mood and Affect: Mood normal.        Behavior: Behavior normal.       Results for orders placed or performed in visit on 04/27/23  Lipid panel   Collection Time: 04/27/23  3:27 PM  Result Value Ref Range   Cholesterol 107 0 - 200 mg/dL   Triglycerides 54.0 0.0 - 149.0 mg/dL   HDL 98.11 >91.47 mg/dL   VLDL 82.9 0.0 - 56.2 mg/dL   LDL Cholesterol 37 0 - 99 mg/dL   Total CHOL/HDL Ratio 2    NonHDL 54.66   Comprehensive metabolic panel   Collection Time: 04/27/23  3:27 PM  Result Value Ref Range   Sodium 139 135 - 145 mEq/L   Potassium 3.5 3.5 - 5.1 mEq/L   Chloride 96 96 - 112 mEq/L   CO2 30 19 - 32 mEq/L   Glucose, Bld 92 70 - 99 mg/dL   BUN 19 6 - 23 mg/dL   Creatinine, Ser 1.30 0.40 - 1.50 mg/dL   Total Bilirubin 1.3 (H) 0.2 - 1.2 mg/dL   Alkaline Phosphatase 138 (H) 39 - 117 U/L   AST 20 0 - 37 U/L   ALT 16 0 - 53 U/L   Total Protein 6.5 6.0 - 8.3 g/dL   Albumin 4.2 3.5 - 5.2 g/dL   GFR 86.57 (L) >84.69 mL/min   Calcium 9.1 8.4 - 10.5 mg/dL  Hemoglobin G2X   Collection Time: 04/27/23  3:27 PM  Result Value Ref Range   Hgb A1c MFr Bld 6.9 (H) 4.6 - 6.5 %  PSA   Collection  Time: 04/27/23  3:27 PM  Result Value Ref Range   PSA 1.39 0.10 - 4.00 ng/mL  Uric acid   Collection Time: 04/27/23  3:27 PM  Result Value Ref Range   Uric Acid, Serum 6.1 4.0 - 7.8 mg/dL  Microalbumin / creatinine urine ratio   Collection Time: 04/27/23  3:27 PM  Result Value Ref Range   Microalb, Ur 33.3 (H) 0.0 - 1.9 mg/dL   Creatinine,U 40.9 mg/dL   Microalb Creat Ratio 50.8 (H) 0.0 - 30.0 mg/g    Assessment & Plan:   Problem List Items Addressed This Visit     Skin rash - Primary   Diffuse pustular rash throughout body including extremities, spares palms/soles groin and face, present for 3-4 weeks now.  Possible reaction to new detergent given temporal relation  - he will stop and change to hypoallergenic option.  If ongoing, he will return for labwork (CBC, CMP, etc) for further evalaution.         Meds ordered this encounter  Medications   doxycycline (VIBRA-TABS) 100 MG tablet    Sig: Take 1 tablet (100 mg total) by mouth 2 (two) times daily.    Dispense:  10 tablet    Refill:  0    No orders of the defined types were placed in this encounter.   Patient Instructions  Possibly reaction to new detergent - change to hypoallergenic one.  Take doxycycline twice daily for 5 days.  Let us know if ongoing despite above for further evaluation including labs.   Follow up plan: No follow-ups on file.  Eustaquio Boyden, MD

## 2023-08-31 ENCOUNTER — Encounter: Payer: Self-pay | Admitting: Family Medicine

## 2023-08-31 DIAGNOSIS — R21 Rash and other nonspecific skin eruption: Secondary | ICD-10-CM

## 2023-09-20 ENCOUNTER — Other Ambulatory Visit: Payer: Self-pay

## 2023-09-20 DIAGNOSIS — E291 Testicular hypofunction: Secondary | ICD-10-CM

## 2023-09-20 DIAGNOSIS — R7989 Other specified abnormal findings of blood chemistry: Secondary | ICD-10-CM

## 2023-09-21 MED ORDER — TESTOSTERONE CYPIONATE 200 MG/ML IM SOLN: INTRAMUSCULAR | 0 refills | Status: DC

## 2023-09-23 ENCOUNTER — Ambulatory Visit: Payer: Self-pay | Admitting: Family Medicine

## 2023-09-23 NOTE — Telephone Encounter (Signed)
 Chief Complaint: Hip pain  Symptoms: bilateral hip pain left more than right Frequency: constant  Pertinent Negatives: Patient denies injury  Disposition: [] ED /[] Urgent Care (no appt availability in office) / [x] Appointment(In office/virtual)/ []  [facility]/ [] Home Care/ [] Refused Recommended Disposition /[] [facility]/ []  Follow-up with PCP Additional Notes: Patient has a history of Bursitis and is calling to see if he can get a sooner appointment with Dr. Dallas Schimke for an injection. Care advice was given and looks like patient has the soonest available at this time. Patient has been added to the waitlist. Asking if he can be worked in today or tomorrow reporting pain 10/10 and difficult walking due to the discomfort. Advised I would forward work-in request to the office.   Copied from CRM [id number]. Topic: Clinical - Red Word Triage >> Sep 23, 2023 10:40 AM Tiffany H wrote: Red Word that prompted transfer to Nurse Triage: Patient called to advise that he has bursitis in his right hip. Patient advised that pain has returned in his hip as of a few weeks ago. Patient advised that he'd like to come in for a shot in his hip.   Patient has March 3rd appointment scheduled. Please advise. Reason for Disposition  [1] SEVERE pain (e.g., excruciating, unable to do any normal activities) AND [2] not improved after 2 hours of pain medicine  Answer Assessment - Initial Assessment Questions 1. LOCATION and RADIATION: "Where is the pain located?"      Left hip  2. QUALITY: "What does the pain feel like?"  (e.g., sharp, dull, aching, burning)     Sharp pain  3. SEVERITY: "How bad is the pain?" "What does it keep you from doing?"   (Scale 1-10; or mild, moderate, severe)   -  MILD (1-3): doesn't interfere with normal activities    -  MODERATE (4-7): interferes with normal activities (e.g., work or school) or awakens from sleep, limping    -  SEVERE (8-10): excruciating pain, unable to do  any normal activities, unable to walk     10/10 4. ONSET: "When did the pain start?" "Does it come and go, or is it there all the time?"     A few weeks  5. WORK OR EXERCISE: "Has there been any recent work or exercise that involved this part of the body?"      Walking around a lot  6. CAUSE: "What do you think is causing the hip pain?"      Bursitis  7. AGGRAVATING FACTORS: "What makes the hip pain worse?" (e.g., walking, climbing stairs, running)     Walking  8. OTHER SYMPTOMS: "Do you have any other symptoms?" (e.g., back pain, pain shooting down leg,  fever, rash)     No  Protocols used: Hip Pain-A-AH

## 2023-09-23 NOTE — Telephone Encounter (Signed)
 Noted.

## 2023-09-26 NOTE — Progress Notes (Unsigned)
   Lyndal Alamillo T. Hakeen Shipes, MD, CAQ Sports Medicine [facility] [facility] [address] [place], [address]  Phone: [phone]  FAX: [phone]  Minoru Chap - 78 y.o. male  MRN [id number]  Date of Birth: Dec 05, 1945  Date: 09/27/2023  PCP: Eustaquio Boyden, MD  Referral: Eustaquio Boyden, MD  No chief complaint on file.  Subjective:   Shreyas Piatkowski is a 77 y.o. very pleasant male patient with There is no height or weight on file to calculate BMI. who presents with the following:  Lorie presents for left-sided hip pain.    Review of Systems is noted in the HPI, as appropriate  Objective:   There were no vitals taken for this visit.  GEN: No acute distress; alert,appropriate. PULM: Breathing comfortably in no respiratory distress PSYCH: Normally interactive.   Laboratory and Imaging Data:  Assessment and Plan:   ***

## 2023-09-27 ENCOUNTER — Ambulatory Visit: Payer: PPO | Admitting: Family Medicine

## 2023-09-27 ENCOUNTER — Encounter: Payer: Self-pay | Admitting: Family Medicine

## 2023-09-27 VITALS — BP 120/70 | HR 93 | Temp 97.6°F | Ht 68.5 in | Wt 233.1 lb

## 2023-09-27 DIAGNOSIS — M7061 Trochanteric bursitis, right hip: Secondary | ICD-10-CM

## 2023-09-27 DIAGNOSIS — M7062 Trochanteric bursitis, left hip: Secondary | ICD-10-CM

## 2023-09-27 MED ORDER — TRIAMCINOLONE ACETONIDE 40 MG/ML IJ SUSP
40.0000 mg | Freq: Once | INTRAMUSCULAR | Status: AC
Start: 2023-09-27 — End: 2023-09-27
  Administered 2023-09-27: 40 mg via INTRA_ARTICULAR

## 2023-10-08 DIAGNOSIS — G4733 Obstructive sleep apnea (adult) (pediatric): Secondary | ICD-10-CM | POA: Diagnosis not present

## 2023-10-12 ENCOUNTER — Ambulatory Visit: Payer: PPO | Admitting: Urology

## 2023-10-13 ENCOUNTER — Ambulatory Visit: Payer: Self-pay | Admitting: Urology

## 2023-10-18 ENCOUNTER — Ambulatory Visit: Admitting: Family Medicine

## 2023-10-18 ENCOUNTER — Encounter: Payer: Self-pay | Admitting: Family Medicine

## 2023-10-18 VITALS — BP 92/60 | HR 97 | Temp 98.7°F | Wt 230.4 lb

## 2023-10-18 DIAGNOSIS — M7062 Trochanteric bursitis, left hip: Secondary | ICD-10-CM

## 2023-10-18 MED ORDER — TRIAMCINOLONE ACETONIDE 40 MG/ML IJ SUSP
40.0000 mg | Freq: Once | INTRAMUSCULAR | Status: AC
Start: 2023-10-18 — End: 2023-10-18
  Administered 2023-10-18: 40 mg via INTRA_ARTICULAR

## 2023-10-18 NOTE — Progress Notes (Unsigned)
 Aaron Mitchell T. Aaron Voris, MD, CAQ Sports Medicine [facility] at [facility] [address] [place], [address]  Phone: [phone]  FAX: [phone]  Aaron Mitchell - 78 y.o. male  MRN [id number]  Date of Birth: 1946-04-20  Date: 10/18/2023  PCP: Eustaquio Boyden, MD  Referral: Eustaquio Boyden, MD  Chief Complaint  Patient presents with   Hip Pain    Left   Subjective:   Aaron Mitchell is a 78 y.o. very pleasant male patient with Body mass index is 34.52 kg/m. who presents with the following:  Recurrent L GTB with recent activity on the ground this weekend.    ICD-10-CM   1. 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  I did do a hip injection on him 3 or 4 weeks ago.  While this is sooner than I typically would repeat an injection, this is an extra-articular injection into the bursa, I think it is not entirely unreasonable.  Aspiration/Injection Procedure Note Aaron Mitchell [birth year] Date of procedure: 10/18/2023  Procedure: Large Joint Aspiration / Injection of Hip, Trochanteric Bursa, L Indications: Pain  Procedure Details Verbal consent obtained. Risks, benefits, and alternatives reviewed. Greater trochanter sterilely prepped with Chloraprep. Ethyl Chloride used for anesthesia. 9 cc of Lidocaine 1% injected with 1 mL of Kenalog 40 mg into trochanteric bursa at area of maximal tenderness at greater trochanter, with part of the medication injected more caudally at the insertion of gluteus medius and minimus. Needle taken to bone to troch bursa, flows easily. Bursa massaged. No bleeding and no complications. Decreased pain after injection. Needle: 22 gauge spinal needle Medication: 1 mL of Kenalog 40 mg   Medication Management during today's office visit: Meds ordered this  encounter  Medications   triamcinolone acetonide (KENALOG-40) injection 40 mg   Signed,  Mushka Laconte T. Jaquarius Seder, MD   Outpatient Encounter Medications as of 10/18/2023  Medication Sig   acetaminophen (TYLENOL 8 HOUR ARTHRITIS PAIN) 650 MG CR tablet Take 2 tablets (1,300 mg total) by mouth 2 (two) times daily as needed for pain.   albuterol (PROVENTIL) (2.5 MG/3ML) 0.083% nebulizer solution Take 3 mLs (2.5 mg total) by nebulization every 6 (six) hours as needed for wheezing or shortness of breath.   albuterol (VENTOLIN HFA) 108 (90 Base) MCG/ACT inhaler Inhale 2 puffs into the lungs every 6 (six) hours as needed for wheezing or shortness of breath.   allopurinol (ZYLOPRIM) 300 MG tablet Take 1 tablet (300 mg total) by mouth daily.   amLODipine (NORVASC) 5 MG tablet Take 1 tablet (5 mg total) by mouth daily.   apixaban (ELIQUIS) 5 MG TABS tablet Take 5 mg by mouth 2 (two) times daily.   atorvastatin (LIPITOR) 40 MG tablet Take 1 tablet (40 mg total) by mouth daily.   buPROPion (WELLBUTRIN XL) 150 MG 24 hr tablet Take 1 tablet (150 mg total) by mouth daily.   diclofenac sodium (VOLTAREN) 1 % GEL Apply 2 g topically daily as needed (pain). Apply to both hands.   fluticasone (FLONASE) 50 MCG/ACT nasal spray instill TWO SPRAYS in each nostril daily   furosemide (LASIX) 80 MG tablet Take 80 mg by mouth daily.   lisinopril (ZESTRIL) 20 MG tablet Take 20 mg by mouth daily.   metaxalone (SKELAXIN) 800 MG tablet Take 1 tablet (800  mg total) by mouth 3 (three) times daily as needed for muscle spasms.   Multiple Vitamins tablet Take 1 tablet by mouth daily.   Potassium 99 MG TABS Take 1 tablet (99 mg total) by mouth daily.   sildenafil (VIAGRA) 100 MG tablet Take 100 mg by mouth as needed for erectile dysfunction.   tadalafil (CIALIS) 20 MG tablet Take 20 mg by mouth daily as needed for erectile dysfunction.   testosterone cypionate (DEPOTESTOSTERONE CYPIONATE) 200 MG/ML injection INJECT 0.4MLS  INTRAMUSCULARLY ONCE A WEEK   tiotropium (SPIRIVA HANDIHALER) 18 MCG inhalation capsule Place 1 capsule (18 mcg total) into inhaler and inhale daily.   traMADol (ULTRAM) 50 MG tablet Take 1 tablet (50 mg total) by mouth every 12 (twelve) hours as needed for moderate pain.   triamterene-hydrochlorothiazide (MAXZIDE-25) 37.5-25 MG tablet TAKE ONE TABLET BY MOUTH BEFORE BREAKFAST   zolpidem (AMBIEN) 10 MG tablet TAKE 1 TABLET(10 MG) BY MOUTH AT BEDTIME AS NEEDED FOR SLEEP   [EXPIRED] triamcinolone acetonide (KENALOG-40) injection 40 mg    No facility-administered encounter medications on file as of 10/18/2023.

## 2023-10-19 ENCOUNTER — Ambulatory Visit

## 2023-10-20 ENCOUNTER — Encounter: Payer: Self-pay | Admitting: Urology

## 2023-10-20 ENCOUNTER — Ambulatory Visit: Payer: PPO | Admitting: Urology

## 2023-10-20 VITALS — BP 102/65 | HR 85 | Ht 70.0 in | Wt 227.0 lb

## 2023-10-20 DIAGNOSIS — E291 Testicular hypofunction: Secondary | ICD-10-CM | POA: Diagnosis not present

## 2023-10-20 NOTE — Progress Notes (Signed)
 I, Maysun Anabel Bene, acting as a scribe for Riki Altes, MD., have documented all relevant documentation on the behalf of Riki Altes, MD, as directed by Riki Altes, MD while in the presence of Riki Altes, MD.  10/20/2023 2:10 PM   Aaron Mitchell 10-05-1945 [id number]  Referring provider: Eustaquio Boyden, MD [address] [place],  [place] [address]  Chief Complaint  Patient presents with   Hypogonadism   Urologic history: 1.  Hypogonadism Symptoms tiredness, fatigue, ED, decreased libido TRT  testosterone cypionate weekly  HPI: Aaron Mitchell is a 78 y.o. male presents for annual follow-up.  Remains on TRT 0.4 cc weekly (200 mg per cc) Interim 6 month labs testosterone 451/hematocrit 49 PSA 04/27/23 drawn by PCP stable 1.39   PMH: Past Medical History:  Diagnosis Date   Achilles tendinitis    Left leg   Borderline diabetes mellitus    Childhood asthma    Degenerative disc disease, lumbar    rec against surgery   Erectile dysfunction    GI bleed 12/2015   hospitalization   Gout    Hypertension    Ileus (HCC) 12/2015   hospitalization    Male hypogonadism    followed by urology on T injections ([place])   Seasonal allergies    pollen   Severe obesity (BMI >= 40) (HCC)    Shoulder bursitis    Sleep apnea    CPAP    Surgical History: Past Surgical History:  Procedure Laterality Date   ADENOIDECTOMY     CARPAL TUNNEL RELEASE Right 2015   CATARACT EXTRACTION W/PHACO Right 07/15/2016   Procedure: CATARACT EXTRACTION PHACO AND INTRAOCULAR LENS PLACEMENT (IOC);  Surgeon: Lockie Mola, MD;  Location: [facility];  Service: Ophthalmology;  Laterality: Right;  TORIC sleep apnea   CATARACT EXTRACTION W/PHACO Left 08/05/2016   Procedure: CATARACT EXTRACTION PHACO AND INTRAOCULAR LENS PLACEMENT (IOC) toric;  Surgeon: Lockie Mola, MD;  Location: [facility];  Service: Ophthalmology;  Laterality: Left;   left sleep apnea toric   COLONOSCOPY  10/2012   rpt 5 yrs ([facility])   COLONOSCOPY WITH PROPOFOL N/A 03/20/2021   mult TAs, rpt 5 yrs Servando Snare, Darren, MD)   EYE SURGERY     Lens replacement 07/12/2016,08/05/2016   LUMBAR EPIDURAL INJECTION Bilateral 04/2017, 12/2017   S1 transforaminal ESI ([facility])   PACEMAKER LEADLESS INSERTION N/A 09/04/2021   Procedure: PACEMAKER LEADLESS INSERTION;  Surgeon: Marcina Millard, MD;  Location: [facility];  Service: Cardiovascular;  Laterality: N/A;   RHINOPLASTY     TONSILLECTOMY  ?1954   TRIGGER FINGER RELEASE Right 2015   4th    Home Medications:  Allergies as of 10/20/2023       Reactions   Codeine Other (See Comments)   Other reaction(s): Other (See Comments) HEADACHES Causes headaches when he stops liquid Codeine-based products   Ace Inhibitors Cough   Cough also present with ARB (losartan)        Medication List        Accurate as of October 20, 2023  2:10 PM. If you have any questions, ask your nurse or doctor.          acetaminophen 650 MG CR tablet Commonly known as: Tylenol 8 Hour Arthritis Pain Take 2 tablets (1,300 mg total) by mouth 2 (two) times daily as needed for pain.   albuterol 108 (90 Base) MCG/ACT inhaler Commonly known as: VENTOLIN HFA Inhale 2 puffs into  the lungs every 6 (six) hours as needed for wheezing or shortness of breath.   albuterol (2.5 MG/3ML) 0.083% nebulizer solution Commonly known as: PROVENTIL Take 3 mLs (2.5 mg total) by nebulization every 6 (six) hours as needed for wheezing or shortness of breath.   allopurinol 300 MG tablet Commonly known as: ZYLOPRIM Take 1 tablet (300 mg total) by mouth daily.   amLODipine 5 MG tablet Commonly known as: NORVASC Take 1 tablet (5 mg total) by mouth daily.   atorvastatin 40 MG tablet Commonly known as: LIPITOR Take 1 tablet (40 mg total) by mouth daily.   buPROPion 150 MG 24 hr tablet Commonly known as: WELLBUTRIN XL Take 1 tablet  (150 mg total) by mouth daily.   diclofenac sodium 1 % Gel Commonly known as: VOLTAREN Apply 2 g topically daily as needed (pain). Apply to both hands.   Eliquis 5 MG Tabs tablet Generic drug: apixaban Take 5 mg by mouth 2 (two) times daily.   fluticasone 50 MCG/ACT nasal spray Commonly known as: FLONASE instill TWO SPRAYS in each nostril daily   furosemide 80 MG tablet Commonly known as: LASIX Take 80 mg by mouth daily.   lisinopril 20 MG tablet Commonly known as: ZESTRIL Take 20 mg by mouth daily.   metaxalone 800 MG tablet Commonly known as: SKELAXIN Take 1 tablet (800 mg total) by mouth 3 (three) times daily as needed for muscle spasms.   Multiple Vitamins tablet Take 1 tablet by mouth daily.   Potassium 99 MG Tabs Take 1 tablet (99 mg total) by mouth daily.   sildenafil 100 MG tablet Commonly known as: VIAGRA Take 100 mg by mouth as needed for erectile dysfunction.   tadalafil 20 MG tablet Commonly known as: CIALIS Take 20 mg by mouth daily as needed for erectile dysfunction.   testosterone cypionate 200 MG/ML injection Commonly known as: DEPOTESTOSTERONE CYPIONATE INJECT 0.4MLS INTRAMUSCULARLY ONCE A WEEK   tiotropium 18 MCG inhalation capsule Commonly known as: Spiriva HandiHaler Place 1 capsule (18 mcg total) into inhaler and inhale daily.   traMADol 50 MG tablet Commonly known as: ULTRAM Take 1 tablet (50 mg total) by mouth every 12 (twelve) hours as needed for moderate pain.   triamterene-hydrochlorothiazide 37.5-25 MG tablet Commonly known as: MAXZIDE-25 TAKE ONE TABLET BY MOUTH BEFORE BREAKFAST   zolpidem 10 MG tablet Commonly known as: AMBIEN TAKE 1 TABLET(10 MG) BY MOUTH AT BEDTIME AS NEEDED FOR SLEEP        Allergies:  Allergies  Allergen Reactions   Codeine Other (See Comments)    Other reaction(s): Other (See Comments) HEADACHES Causes headaches when he stops liquid Codeine-based products   Ace Inhibitors Cough    Cough also  present with ARB (losartan)    Family History: Family History  Problem Relation Age of Onset   Alcohol abuse Father    Hypertension Father    AVM Father 45       hemorrhagic stroke   Diabetes Paternal Grandmother    Cancer Neg Hx    CAD Neg Hx    Prostate cancer Neg Hx    Bladder Cancer Neg Hx    Kidney cancer Neg Hx     Social History:  reports that he quit smoking about 45 years ago. His smoking use included cigarettes. He has never used smokeless tobacco. He reports current alcohol use of about 5.0 standard drinks of alcohol per week. He reports that he does not use drugs.   Physical Exam: BP 102/65  Pulse 85   Ht 5\' 10"  (1.778 m)   Wt 227 lb (103 kg)   BMI 32.57 kg/m   Constitutional:  Alert and oriented, No acute distress. HEENT: [place] AT, moist mucus membranes.  Trachea midline, no masses. Cardiovascular: No clubbing, cyanosis, or edema. Respiratory: Normal respiratory effort, no increased work of breathing. GI: Abdomen is soft, nontender, nondistended, no abdominal masses Skin: No rashes, bruises or suspicious lesions. Neurologic: Grossly intact, no focal deficits, moving all 4 extremities. Psychiatric: Normal mood and affect.  Assessment & Plan:    1. Hypogonadism Stable Testosterone level drawn today; scheduled for CBC with PCP May 2025  Lab visit 6 month with testosterone, H/H 1 year office visit with testosterone, [facility]  [facility] [address], [address] [place], [place] [address] [phone]

## 2023-10-21 ENCOUNTER — Encounter: Payer: Self-pay | Admitting: Urology

## 2023-10-21 LAB — TESTOSTERONE: Testosterone: 693 ng/dL (ref 264–916)

## 2023-10-21 LAB — HEMOGLOBIN AND HEMATOCRIT, BLOOD
Hematocrit: 45.3 % (ref 37.5–51.0)
Hemoglobin: 15.1 g/dL (ref 13.0–17.7)

## 2023-10-26 ENCOUNTER — Ambulatory Visit: Payer: Self-pay

## 2023-10-26 NOTE — Telephone Encounter (Signed)
 Appreciate Dr Patsy Lager seeing this patient.

## 2023-10-26 NOTE — Telephone Encounter (Signed)
 Aaron Mitchell is already on your schedule tomorrow at 11:40 am.

## 2023-10-26 NOTE — Progress Notes (Unsigned)
     Lillion Elbert T. Angello Chien, MD, CAQ Sports Medicine [facility] at [facility] [address] [place], [address]  Phone: [phone]  FAX: [phone]  Aaron Mitchell - 78 y.o. male  MRN [id number]  Date of Birth: 1945-10-03  Date: 10/27/2023  PCP: Eustaquio Boyden, MD  Referral: Eustaquio Boyden, MD  No chief complaint on file.  Subjective:   Aaron Mitchell is a 78 y.o. very pleasant male patient with There is no height or weight on file to calculate BMI. who presents with the following:   Aaron Mitchell is a very nice patient who I have known for many years.  I actually saw him last week with some left-sided hip pain and trochanteric bursitis.  I did reinject his left hip at that point.  He called in with some severe pain, so I am going to recheck him today.    Review of Systems is noted in the HPI, as appropriate  Objective:   There were no vitals taken for this visit.  GEN: No acute distress; alert,appropriate. PULM: Breathing comfortably in no respiratory distress PSYCH: Normally interactive.   Laboratory and Imaging Data:  Assessment and Plan:   ***

## 2023-10-26 NOTE — Telephone Encounter (Signed)
  Chief Complaint: Schedule appointment Symptoms: refuses triage Frequency: refuses triage Pertinent Negatives: Patient denies refused to answer Disposition: [] ED /[] Urgent Care (no appt availability in office) / [x] Appointment(In office/virtual)/ []  [facility]/ [] Home Care/ [] Refused Recommended Disposition /[] [facility]/ []  Follow-up with PCP Additional Notes:  Received caller via warm transfer to agent, patient requesting appointment for severe pain but none available so he was transferred to nurse triage. When connected this writer attempted to verify patient identifiers when he became angry and cussing that he already gave this information. He states he "just want to make a (cuss word) appointment, I want to talk with the office staff'. This Clinical research associate was eventually able to verify patient identifiers, he continued to refuse triage. States hip pain and is worsening. Schedule next available acute visit with requested alternate provider. Patient was not will to hear care advice.  Copied from CRM [id number]. Topic: Clinical - Red Word Triage >> Oct 26, 2023 10:39 AM [place] M wrote: Red Word that prompted transfer to Nurse Triage: Having extreme pain in left hip, has been going on for awhile now but its getting worse. Reason for Disposition  [1] Angry or rude caller AND [2] doesn't respond to 5 minutes of triager counseling AND [3] sick adult (or caller)  Protocols used: Difficult Call-A-AH

## 2023-10-27 ENCOUNTER — Ambulatory Visit (INDEPENDENT_AMBULATORY_CARE_PROVIDER_SITE_OTHER)
Admission: RE | Admit: 2023-10-27 | Discharge: 2023-10-27 | Disposition: A | Discharge status: Home / Self Care | Source: Ambulatory Visit | Attending: Family Medicine | Admitting: Family Medicine

## 2023-10-27 ENCOUNTER — Encounter: Payer: Self-pay | Admitting: Family Medicine

## 2023-10-27 ENCOUNTER — Ambulatory Visit (INDEPENDENT_AMBULATORY_CARE_PROVIDER_SITE_OTHER): Admitting: Family Medicine

## 2023-10-27 VITALS — BP 100/60 | HR 99 | Temp 98.0°F | Ht 68.5 in | Wt 232.2 lb

## 2023-10-27 DIAGNOSIS — M25552 Pain in left hip: Secondary | ICD-10-CM

## 2023-10-27 DIAGNOSIS — M1612 Unilateral primary osteoarthritis, left hip: Secondary | ICD-10-CM | POA: Diagnosis not present

## 2023-10-27 MED ORDER — DIAZEPAM 10 MG PO TABS
ORAL_TABLET | ORAL | 0 refills | Status: DC
Start: 2023-10-27 — End: 2023-11-30

## 2023-10-27 MED ORDER — PREDNISONE 20 MG PO TABS: ORAL_TABLET | ORAL | 0 refills | Status: DC

## 2023-10-28 ENCOUNTER — Encounter: Payer: Self-pay | Admitting: Family Medicine

## 2023-10-28 NOTE — Telephone Encounter (Signed)
 Can you please double-check on Aaron Mitchell's fluoro guided hip injection.  I personally called [facility] yesterday, and they told me that they would reach out to him directly.

## 2023-11-02 ENCOUNTER — Encounter: Payer: Self-pay | Admitting: Family Medicine

## 2023-11-02 NOTE — Telephone Encounter (Signed)
Patient picked up disc.

## 2023-11-02 NOTE — Telephone Encounter (Signed)
 Please burn X-Ray Disc for Mr. Tarry.

## 2023-11-03 ENCOUNTER — Ambulatory Visit
Admission: RE | Admit: 2023-11-03 | Discharge: 2023-11-03 | Disposition: A | Discharge status: Home / Self Care | Source: Ambulatory Visit | Attending: Family Medicine | Admitting: Family Medicine

## 2023-11-03 DIAGNOSIS — M25552 Pain in left hip: Secondary | ICD-10-CM | POA: Diagnosis not present

## 2023-11-03 DIAGNOSIS — M1612 Unilateral primary osteoarthritis, left hip: Secondary | ICD-10-CM

## 2023-11-03 MED ORDER — METHYLPREDNISOLONE ACETATE 40 MG/ML INJ SUSP (RADIOLOG
80.0000 mg | Freq: Once | INTRAMUSCULAR | Status: AC
Start: 2023-11-03 — End: 2023-11-03
  Administered 2023-11-03: 80 mg via INTRA_ARTICULAR

## 2023-11-03 MED ORDER — IOPAMIDOL (ISOVUE-M 200) INJECTION 41%
1.0000 mL | Freq: Once | INTRAMUSCULAR | Status: AC
Start: 2023-11-03 — End: 2023-11-03
  Administered 2023-11-03: 1 mL via INTRA_ARTICULAR

## 2023-11-04 ENCOUNTER — Telehealth: Payer: Self-pay | Admitting: Family Medicine

## 2023-11-04 DIAGNOSIS — M1612 Unilateral primary osteoarthritis, left hip: Secondary | ICD-10-CM

## 2023-11-04 NOTE — Telephone Encounter (Signed)
 Aaron Mitchell stopped by office and would like to start the process for hip replacement.[name] Kitchen  He ask that I send Dr. Patsy Lager a message to see if he would put in the referral for this.  He wants to know who Dr. Patsy Lager recommends.  He prefers [facility].

## 2023-11-11 ENCOUNTER — Encounter: Payer: Self-pay | Admitting: Family Medicine

## 2023-11-11 NOTE — Addendum Note (Signed)
 Addended by: Scherrie Curt on: 11/11/2023 03:54 PM   Modules accepted: Orders

## 2023-11-15 NOTE — Telephone Encounter (Signed)
 e

## 2023-11-17 DIAGNOSIS — H43812 Vitreous degeneration, left eye: Secondary | ICD-10-CM | POA: Diagnosis not present

## 2023-11-19 DIAGNOSIS — Z0189 Encounter for other specified special examinations: Secondary | ICD-10-CM | POA: Diagnosis not present

## 2023-11-19 DIAGNOSIS — M1612 Unilateral primary osteoarthritis, left hip: Secondary | ICD-10-CM | POA: Diagnosis not present

## 2023-11-20 ENCOUNTER — Encounter: Payer: Self-pay | Admitting: Family Medicine

## 2023-11-26 NOTE — Telephone Encounter (Signed)
 Patient schedued for 5.6.25 at 2pm

## 2023-11-26 NOTE — Telephone Encounter (Signed)
 Plz schedule preop eval for next week Tues or Wed 2 pm.

## 2023-11-29 ENCOUNTER — Ambulatory Visit (INDEPENDENT_AMBULATORY_CARE_PROVIDER_SITE_OTHER): Payer: PPO

## 2023-11-29 VITALS — BP 100/60 | Ht 70.0 in | Wt 225.0 lb

## 2023-11-29 DIAGNOSIS — Z2821 Immunization not carried out because of patient refusal: Secondary | ICD-10-CM | POA: Diagnosis not present

## 2023-11-29 DIAGNOSIS — Z0000 Encounter for general adult medical examination without abnormal findings: Secondary | ICD-10-CM | POA: Diagnosis not present

## 2023-11-29 NOTE — Patient Instructions (Signed)
 Aaron Mitchell , Thank you for taking time to come for your Medicare Wellness Visit. I appreciate your ongoing commitment to your health goals. Please review the following plan we discussed and let me know if I can assist you in the future.   Referrals/Orders/Follow-Ups/Clinician Recommendations: follow up as scheduled for next AWV  This is a list of the screening recommended for you and due dates:  Health Maintenance  Topic Date Due   Complete foot exam   Never done   COVID-19 Vaccine (8 - 2024-25 season) 09/19/2023   Hemoglobin A1C  10/26/2023   Eye exam for diabetics  12/14/2023   Flu Shot  02/25/2024   Yearly kidney function blood test for diabetes  04/26/2024   Yearly kidney health urinalysis for diabetes  04/26/2024   DTaP/Tdap/Td vaccine (3 - Td or Tdap) 08/23/2024   Medicare Annual Wellness Visit  11/28/2024   Colon Cancer Screening  03/20/2026   Pneumonia Vaccine  Completed   Hepatitis C Screening  Completed   Zoster (Shingles) Vaccine  Completed   HPV Vaccine  Aged Out   Meningitis B Vaccine  Aged Out   Cologuard (Stool DNA test)  Discontinued    Advanced directives: (Declined) Advance directive discussed with you today. Even though you declined this today, please call our office should you change your mind, and we can give you the proper paperwork for you to fill out.  Next Medicare Annual Wellness Visit scheduled for next year: Yes  Have you seen your provider in the last 6 months (3 months if uncontrolled diabetes)? yes

## 2023-11-29 NOTE — Progress Notes (Signed)
 Because this visit was a virtual/telehealth visit,  certain criteria was not obtained, such a blood pressure, CBG if applicable, and timed get up and go. Any medications not marked as "taking" were not mentioned during the medication reconciliation part of the visit. Any vitals not documented were not able to be obtained due to this being a telehealth visit or patient was unable to self-report a recent blood pressure reading due to a lack of equipment at home via telehealth. Vitals that have been documented are verbally provided by the patient.  Subjective:   Aaron Mitchell is a 78 y.o. who presents for a Medicare Wellness preventive visit.  Visit Complete: Virtual I connected with  Aaron Mitchell on 11/29/23 by a audio enabled telemedicine application and verified that I am speaking with the correct person using two identifiers.  Patient Location: Home  Provider Location: Home Office  I discussed the limitations of evaluation and management by telemedicine. The patient expressed understanding and agreed to proceed.  Vital Signs: Because this visit was a virtual/telehealth visit, some criteria may be missing or patient reported. Any vitals not documented were not able to be obtained and vitals that have been documented are patient reported.  VideoDeclined- This patient declined Librarian, academic. Therefore the visit was completed with audio only.  Persons Participating in Visit: Patient.  AWV Questionnaire: No: Patient Medicare AWV questionnaire was not completed prior to this visit.  Cardiac Risk Factors include: advanced age (>66men, >64 women);diabetes mellitus;hypertension;male gender;obesity (BMI >30kg/m2);Other (see comment), Risk factor comments: a-fib and copd     Objective:    Today's Vitals   11/29/23 0852  BP: 100/60  Weight: 225 lb (102.1 kg)  Height: 5\' 10"  (1.778 m)   Body mass index is 32.28 kg/m.     11/29/2023    8:59 AM 11/25/2022     8:39 AM 12/03/2021    2:44 PM 09/04/2021    7:05 AM 10/09/2020   11:10 AM 07/25/2019   11:16 AM 07/05/2018   10:28 AM  Advanced Directives  Does Patient Have a Medical Advance Directive? Yes Yes No No Yes Yes Yes  Type of Estate agent of [place];Living will Healthcare Power of [place];Living will   Healthcare Power of [place];Living will Healthcare Power of [place];Living will Healthcare Power of [place];Living will  Does patient want to make changes to medical advance directive?  No - Patient declined       Copy of Healthcare Power of Attorney in Chart? No - copy requested Yes - validated most recent copy scanned in chart (See row information)   No - copy requested Yes - validated most recent copy scanned in chart (See row information) No - copy requested  Would patient like information on creating a medical advance directive?   No - Patient declined No - Patient declined       Current Medications (verified) Outpatient Encounter Medications as of 11/29/2023  Medication Sig   acetaminophen  (TYLENOL  8 HOUR ARTHRITIS PAIN) 650 MG CR tablet Take 2 tablets (1,300 mg total) by mouth 2 (two) times daily as needed for pain.   albuterol  (PROVENTIL ) (2.5 MG/3ML) 0.083% nebulizer solution Take 3 mLs (2.5 mg total) by nebulization every 6 (six) hours as needed for wheezing or shortness of breath.   albuterol  (VENTOLIN  HFA) 108 (90 Base) MCG/ACT inhaler Inhale 2 puffs into the lungs every 6 (six) hours as needed for wheezing or shortness of breath.   allopurinol  (ZYLOPRIM ) 300 MG tablet Take 1 tablet (  300 mg total) by mouth daily.   amLODipine  (NORVASC ) 5 MG tablet Take 1 tablet (5 mg total) by mouth daily.   apixaban (ELIQUIS) 5 MG TABS tablet Take 5 mg by mouth 2 (two) times daily.   atorvastatin  (LIPITOR) 40 MG tablet Take 1 tablet (40 mg total) by mouth daily.   buPROPion  (WELLBUTRIN  XL) 150 MG 24 hr tablet Take 1 tablet (150 mg total) by mouth daily.   diazepam  (VALIUM ) 10 MG  tablet Take 30 minutes before procedure   diclofenac  sodium (VOLTAREN ) 1 % GEL Apply 2 g topically daily as needed (pain). Apply to both hands.   fluticasone  (FLONASE ) 50 MCG/ACT nasal spray instill TWO SPRAYS in each nostril daily   furosemide  (LASIX ) 80 MG tablet Take 80 mg by mouth daily.   lisinopril  (ZESTRIL ) 20 MG tablet Take 20 mg by mouth daily.   metaxalone  (SKELAXIN ) 800 MG tablet Take 1 tablet (800 mg total) by mouth 3 (three) times daily as needed for muscle spasms.   Multiple Vitamins tablet Take 1 tablet by mouth daily.   Potassium 99 MG TABS Take 1 tablet (99 mg total) by mouth daily.   predniSONE  (DELTASONE ) 20 MG tablet 2 tabs po daily for 5 days, then 1 tab po daily for 5 days   sildenafil (VIAGRA) 100 MG tablet Take 100 mg by mouth as needed for erectile dysfunction.   tadalafil (CIALIS) 20 MG tablet Take 20 mg by mouth daily as needed for erectile dysfunction.   testosterone  cypionate (DEPOTESTOSTERONE CYPIONATE) 200 MG/ML injection INJECT 0.4MLS INTRAMUSCULARLY ONCE A WEEK   tiotropium (SPIRIVA  HANDIHALER) 18 MCG inhalation capsule Place 1 capsule (18 mcg total) into inhaler and inhale daily.   traMADol  (ULTRAM ) 50 MG tablet Take 1 tablet (50 mg total) by mouth every 12 (twelve) hours as needed for moderate pain.   triamterene -hydrochlorothiazide  (MAXZIDE-25) 37.5-25 MG tablet TAKE ONE TABLET BY MOUTH BEFORE BREAKFAST   zolpidem  (AMBIEN ) 10 MG tablet TAKE 1 TABLET(10 MG) BY MOUTH AT BEDTIME AS NEEDED FOR SLEEP   No facility-administered encounter medications on file as of 11/29/2023.    Allergies (verified) Codeine and Ace inhibitors   History: Past Medical History:  Diagnosis Date   Achilles tendinitis    Left leg   Borderline diabetes mellitus    Childhood asthma    Degenerative disc disease, lumbar    rec against surgery   Erectile dysfunction    GI bleed 12/2015   hospitalization   Gout    Hypertension    Ileus (HCC) 12/2015   hospitalization    Male  hypogonadism    followed by urology on T injections ([place])   Seasonal allergies    pollen   Severe obesity (BMI >= 40) (HCC)    Shoulder bursitis    Sleep apnea    CPAP   Past Surgical History:  Procedure Laterality Date   ADENOIDECTOMY     CARPAL TUNNEL RELEASE Right 2015   CATARACT EXTRACTION W/PHACO Right 07/15/2016   Procedure: CATARACT EXTRACTION PHACO AND INTRAOCULAR LENS PLACEMENT (IOC);  Surgeon: Annell Kidney, MD;  Location: [facility];  Service: Ophthalmology;  Laterality: Right;  TORIC sleep apnea   CATARACT EXTRACTION W/PHACO Left 08/05/2016   Procedure: CATARACT EXTRACTION PHACO AND INTRAOCULAR LENS PLACEMENT (IOC) toric;  Surgeon: Annell Kidney, MD;  Location: [facility];  Service: Ophthalmology;  Laterality: Left;  left sleep apnea toric   COLONOSCOPY  10/2012   rpt 5 yrs ([facility])   COLONOSCOPY WITH PROPOFOL  N/A  03/20/2021   mult TAs, rpt 5 yrs Ole Berkeley, Darren, MD)   EYE SURGERY     Lens replacement 07/12/2016,08/05/2016   LUMBAR EPIDURAL INJECTION Bilateral 04/2017, 12/2017   S1 transforaminal ESI ([place])   PACEMAKER LEADLESS INSERTION N/A 09/04/2021   Procedure: PACEMAKER LEADLESS INSERTION;  Surgeon: Percival Brace, MD;  Location: [facility];  Service: Cardiovascular;  Laterality: N/A;   RHINOPLASTY     TONSILLECTOMY  ?1954   TRIGGER FINGER RELEASE Right 12-28-13   4th   Family History  Problem Relation Age of Onset   Alcohol abuse Father    Hypertension Father    AVM Father 62       hemorrhagic stroke   Diabetes Paternal Grandmother    Cancer Neg Hx    CAD Neg Hx    Prostate cancer Neg Hx    Bladder Cancer Neg Hx    Kidney cancer Neg Hx    Social History   Socioeconomic History   Marital status: Married    Spouse name: Not on file   Number of children: Not on file   Years of education: Not on file   Highest education level: Associate degree: occupational, Scientist, product/process development, or vocational program   Occupational History   Not on file  Tobacco Use   Smoking status: Former    Current packs/day: 0.00    Types: Cigarettes    Quit date: 07/27/1978    Years since quitting: 45.3   Smokeless tobacco: Never   Tobacco comments:    Quit in Dec 29, 1978  Vaping Use   Vaping status: Never Used  Substance and Sexual Activity   Alcohol use: Yes    Alcohol/week: 5.0 standard drinks of alcohol    Types: 5 Shots of liquor per week    Comment:     Drug use: No   Sexual activity: Not on file  Other Topics Concern   Not on file  Social History Narrative   Lives at home with wife Leola Raisin), dog passed away 12/29/15   1 grown child   Occ: retired Personnel officer, was Engineer, structural    Edu: 2 yrs college   Activity: no regular exercise   Diet: good water, fruits daily, splenda iced tea   Social Drivers of Corporate investment banker Strain: Low Risk  (11/29/2023)   Overall Financial Resource Strain (CARDIA)    Difficulty of Paying Living Expenses: Not hard at all  Food Insecurity: No Food Insecurity (11/29/2023)   Hunger Vital Sign    Worried About Running Out of Food in the Last Year: Never true    Ran Out of Food in the Last Year: Never true  Transportation Needs: No Transportation Needs (11/29/2023)   PRAPARE - Administrator, Civil Service (Medical): No    Lack of Transportation (Non-Medical): No  Physical Activity: Insufficiently Active (11/29/2023)   Exercise Vital Sign    Days of Exercise per Week: 2 days    Minutes of Exercise per Session: 30 min  Stress: No Stress Concern Present (11/29/2023)   [employer] of Occupational Health - Occupational Stress Questionnaire    Feeling of Stress : Not at all  Social Connections: Unknown (11/29/2023)   Social Connection and Isolation Panel [NHANES]    Frequency of Communication with Friends and Family: More than three times a week    Frequency of Social Gatherings with Friends and Family: More than three times a week    Attends Religious  Services: Patient declined  Active Member of Clubs or Organizations: Patient declined    Attends Banker Meetings: More than 4 times per year    Marital Status: Married    Tobacco Counseling Counseling given: Not Answered Tobacco comments: Quit in 1980    Clinical Intake:  Pre-visit preparation completed: Yes  Pain : No/denies pain     BMI - recorded: 32.28 Nutritional Status: BMI > 30  Obese Nutritional Risks: None Diabetes: No  Lab Results  Component Value Date   HGBA1C 6.9 (H) 04/27/2023   HGBA1C 6.5 02/23/2022   HGBA1C 6.7 (A) 08/13/2021     How often do you need to have someone help you when you read instructions, pamphlets, or other written materials from your doctor or pharmacy?: 1 - Never What is the last grade level you completed in school?: COllege  Interpreter Needed?: No  Information entered by :: [employer]   Activities of Daily Living     11/29/2023    8:58 AM  In your present state of health, do you have any difficulty performing the following activities:  Hearing? 0  Vision? 0  Difficulty concentrating or making decisions? 0  Walking or climbing stairs? 0  Dressing or bathing? 0  Doing errands, shopping? 0  Preparing Food and eating ? N  Using the Toilet? N  In the past six months, have you accidently leaked urine? N  Do you have problems with loss of bowel control? N  Managing your Medications? N  Managing your Finances? N  Housekeeping or managing your Housekeeping? N    Patient Care Team: Claire Crick, MD as PCP - General (Family Medicine) Chares Commons., MD (Rheumatology) Annell Kidney, MD as Referring Physician (Ophthalmology) Lesly Raspberry, MD (Otolaryngology) Alta Ast, [facility] (Inactive) as Pharmacist (Pharmacist) Michelle Aid, MD as Consulting Physician (Cardiology)  Indicate any recent Medical Services you may have received from other than [facility] providers in the past  year (date may be approximate).     Assessment:   This is a routine wellness examination for Byran.  Hearing/Vision screen Hearing Screening - Comments:: No hearing difficulties Vision Screening - Comments:: Wears glasses   Goals Addressed             This Visit's Progress    Patient Stated       To survive        Depression Screen     11/29/2023   12:47 PM 09/27/2023    2:26 PM 08/23/2023    2:03 PM 05/26/2023    2:08 PM 12/24/2022   10:55 AM 11/25/2022    8:42 AM 09/14/2022   10:30 AM  PHQ 2/9 Scores  PHQ - 2 Score 0 0 0 0 0 1 0  PHQ- 9 Score 0 0  0 0  0    Fall Risk     11/29/2023    8:58 AM 11/25/2022    8:36 AM 09/14/2022   10:29 AM 08/07/2022   11:59 AM 12/03/2021    2:45 PM  Fall Risk   Falls in the past year? 0 0 0 0 1  Number falls in past yr: 0 0 0  0  Injury with Fall? 0 0 0  0  Risk for fall due to : No Fall Risks No Fall Risks No Fall Risks  History of fall(s)  Follow up Falls prevention discussed;Falls evaluation completed Falls prevention discussed Falls evaluation completed  Falls prevention discussed    MEDICARE RISK AT HOME:  Medicare  Risk at Home Any stairs in or around the home?: Yes If so, are there any without handrails?: No Home free of loose throw rugs in walkways, pet beds, electrical cords, etc?: Yes Adequate lighting in your home to reduce risk of falls?: Yes Life alert?: No Use of a cane, walker or w/c?: Yes (cane) Grab bars in the bathroom?: Yes Shower chair or bench in shower?: Yes Elevated toilet seat or a handicapped toilet?: Yes  TIMED UP AND GO:  Was the test performed?  No  Cognitive Function: 6CIT completed    10/09/2020   11:17 AM 07/25/2019   11:19 AM 07/05/2018   10:29 AM 07/01/2017    8:56 AM  MMSE - Mini Mental State Exam  Not completed: Refused     Orientation to time  5 5 5   Orientation to Place  5 5 5   Registration  3 3 3   Attention/ Calculation  5 0 0  Recall  3 3 1   Recall-comments    unable to recall 2 of 3  words  Language- name 2 objects   0 0  Language- repeat  1 1 1   Language- follow 3 step command   3 3  Language- read & follow direction   0 0  Write a sentence   0 0  Copy design   0 0  Total score   20 18        11/29/2023    8:55 AM 11/25/2022    8:41 AM  6CIT Screen  What Year? 0 points 0 points  What month? 0 points 0 points  What time? 0 points 0 points  Count back from 20 0 points 0 points  Months in reverse 0 points 0 points  Repeat phrase 0 points 0 points  Total Score 0 points 0 points    Immunizations Immunization History  Administered Date(s) Administered   Fluad Quad(high Dose 65+) 06/30/2022, 03/19/2023   Influenza, High Dose Seasonal PF 05/14/2017, 04/07/2018, 04/18/2020, 05/06/2021   Influenza, Quadrivalent, Recombinant, Inj, Pf 03/20/2019, 06/12/2019   Influenza-Unspecified 08/27/2016   Moderna Covid-19 Vaccine Bivalent Booster 44yrs & up 05/01/2021   Moderna SARS-COV2 Booster Vaccination 12/13/2020   Moderna Sars-Covid-2 Vaccination 09/08/2019, 10/06/2019, 05/20/2020   Pfizer(Comirnaty)Fall Seasonal Vaccine 12 years and older 06/30/2022, 03/19/2023   Pneumococcal Conjugate-13 08/23/2014, 03/20/2019   Pneumococcal Polysaccharide-23 03/26/2016   Respiratory Syncytial Virus Vaccine,Recomb Aduvanted(Arexvy) 06/30/2022   Td 08/23/2014   Tdap 08/23/2014   Zoster Recombinant(Shingrix) 03/14/2018, 05/16/2018   Zoster, Live 08/08/2013    Screening Tests Health Maintenance  Topic Date Due   FOOT EXAM  Never done   COVID-19 Vaccine (8 - 2024-25 season) 09/19/2023   HEMOGLOBIN A1C  10/26/2023   OPHTHALMOLOGY EXAM  12/14/2023   INFLUENZA VACCINE  02/25/2024   Diabetic kidney evaluation - eGFR measurement  04/26/2024   Diabetic kidney evaluation - Urine ACR  04/26/2024   DTaP/Tdap/Td (3 - Td or Tdap) 08/23/2024   Medicare Annual Wellness (AWV)  11/28/2024   Colonoscopy  03/20/2026   Pneumonia Vaccine 65+ Years old  Completed   Hepatitis C Screening  Completed    Zoster Vaccines- Shingrix  Completed   HPV VACCINES  Aged Out   Meningococcal B Vaccine  Aged Out   Fecal DNA (Cologuard)  Discontinued    Health Maintenance  Health Maintenance Due  Topic Date Due   FOOT EXAM  Never done   COVID-19 Vaccine (8 - 2024-25 season) 09/19/2023   HEMOGLOBIN A1C  10/26/2023   Health  Maintenance Items Addressed:patient states he will discuss at next appt  Additional Screening:  Vision Screening: Recommended annual ophthalmology exams for early detection of glaucoma and other disorders of the eye.  Dental Screening: Recommended annual dental exams for proper oral hygiene  Community Resource Referral / Chronic Care Management: CRR required this visit?  No   CCM required this visit?  No     Plan:     I have personally reviewed and noted the following in the patient's chart:   Medical and social history Use of alcohol, tobacco or illicit drugs  Current medications and supplements including opioid prescriptions. Patient is not currently taking opioid prescriptions. Functional ability and status Nutritional status Physical activity Advanced directives List of other physicians Hospitalizations, surgeries, and ER visits in previous 12 months Vitals Screenings to include cognitive, depression, and falls Referrals and appointments  In addition, I have reviewed and discussed with patient certain preventive protocols, quality metrics, and best practice recommendations. A written personalized care plan for preventive services as well as general preventive health recommendations were provided to patient.     Freeda Jerry, [place]   11/29/2023   After Visit Summary: (MyChart) Due to this being a telephonic visit, the after visit summary with patients personalized plan was offered to patient via MyChart   Notes: Nothing significant to report at this time.

## 2023-11-30 ENCOUNTER — Encounter: Payer: Self-pay | Admitting: Family Medicine

## 2023-11-30 ENCOUNTER — Ambulatory Visit (INDEPENDENT_AMBULATORY_CARE_PROVIDER_SITE_OTHER): Admitting: Family Medicine

## 2023-11-30 ENCOUNTER — Ambulatory Visit (INDEPENDENT_AMBULATORY_CARE_PROVIDER_SITE_OTHER)
Admission: RE | Admit: 2023-11-30 | Discharge: 2023-11-30 | Disposition: A | Discharge status: Home / Self Care | Source: Ambulatory Visit | Attending: Family Medicine | Admitting: Family Medicine

## 2023-11-30 VITALS — BP 124/70 | HR 74 | Temp 97.9°F | Ht 70.0 in | Wt 233.5 lb

## 2023-11-30 DIAGNOSIS — Z01818 Encounter for other preprocedural examination: Secondary | ICD-10-CM

## 2023-11-30 DIAGNOSIS — E1169 Type 2 diabetes mellitus with other specified complication: Secondary | ICD-10-CM | POA: Diagnosis not present

## 2023-11-30 DIAGNOSIS — Z950 Presence of cardiac pacemaker: Secondary | ICD-10-CM | POA: Diagnosis not present

## 2023-11-30 DIAGNOSIS — I340 Nonrheumatic mitral (valve) insufficiency: Secondary | ICD-10-CM

## 2023-11-30 DIAGNOSIS — I10 Essential (primary) hypertension: Secondary | ICD-10-CM

## 2023-11-30 DIAGNOSIS — J449 Chronic obstructive pulmonary disease, unspecified: Secondary | ICD-10-CM

## 2023-11-30 DIAGNOSIS — I482 Chronic atrial fibrillation, unspecified: Secondary | ICD-10-CM

## 2023-11-30 DIAGNOSIS — I447 Left bundle-branch block, unspecified: Secondary | ICD-10-CM | POA: Diagnosis not present

## 2023-11-30 DIAGNOSIS — M1611 Unilateral primary osteoarthritis, right hip: Secondary | ICD-10-CM | POA: Diagnosis not present

## 2023-11-30 DIAGNOSIS — I495 Sick sinus syndrome: Secondary | ICD-10-CM

## 2023-11-30 DIAGNOSIS — G4733 Obstructive sleep apnea (adult) (pediatric): Secondary | ICD-10-CM | POA: Diagnosis not present

## 2023-11-30 MED ORDER — TRAMADOL HCL 50 MG PO TABS: 50.0000 mg | ORAL_TABLET | Freq: Two times a day (BID) | ORAL | 0 refills | Status: DC | PRN

## 2023-11-30 NOTE — Patient Instructions (Signed)
 EKG, xray, labs today  We will forward results to Dr Larwance Poe office for medical clearance.  Cardiac clearance to come from [facility].  Good to see you today.

## 2023-11-30 NOTE — Assessment & Plan Note (Signed)
 Chronic issue.

## 2023-11-30 NOTE — Progress Notes (Addendum)
 Ph: [phone] Fax: [phone]   Patient ID: Aaron Mitchell, male    DOB: 11-13-1945, 78 y.o.   MRN: [id number]  This visit was conducted in person.  BP 124/70   Pulse 74   Temp 97.9 F (36.6 C) (Oral)   Ht 5\' 10"  (1.778 m)   Wt 233 lb 8 oz (105.9 kg)   SpO2 97%   BMI 33.50 kg/m    CC: preop evaluation Subjective:   HPI: Aaron Mitchell is a 78 y.o. male presenting on 11/30/2023 for Pre-op Exam (Pt needs L hip replacement. )   Paladin Veillette  has a past medical history of Achilles tendinitis, Borderline diabetes mellitus, Childhood asthma, Degenerative disc disease, lumbar, Erectile dysfunction, GI bleed (12/2015), Gout, Hypertension, Ileus (HCC) (12/2015), Male hypogonadism, Seasonal allergies, Severe obesity (BMI >= 40) (HCC), Shoulder bursitis, and Sleep apnea.  Planned upcoming L hip replacement by Dr Barnie Libra with [facility] in [place].  Requests pain medication to gt to surgery.   Known atrial fibrillation with pacemaker in place as well as moderate mitral regurgitation, followed by [facility] Dr Beau Bound last seen 06/2023.   Patient has tolerated general anesthesia well in the past.  Latest surgical intervention was pacemaker placement 08/2021, colonoscopy 02/2021, rhinoplasty 1963.  Denies trouble with post-op nausea/vomiting, or trouble awakening after surgery.   Denies chest pain, palpitations, leg swelling, HA, dizziness.  No fevers/chills, coughing, abd pain, or UTI symptoms.  Had some isolated episodes of diarrhea a few months ago, none recently.  COPD with some dyspnea and cough.   Revised cardiac risk index score = 1  Planned high risk surgery (intraperitoneal, intrathoracic, or suprainguinal vascular): No History of ischemic heart disease: No History of congestive heart failure: yes History of cerebrovascular disease: No History of preoperative insulin treatment: No History of kidney disease with creatinine >2 mg/dL: No       Relevant  past medical, surgical, family and social history reviewed and updated as indicated. Interim medical history since our last visit reviewed. Allergies and medications reviewed and updated. Outpatient Medications Prior to Visit  Medication Sig Dispense Refill   acetaminophen  (TYLENOL  8 HOUR ARTHRITIS PAIN) 650 MG CR tablet Take 2 tablets (1,300 mg total) by mouth 2 (two) times daily as needed for pain.     albuterol  (PROVENTIL ) (2.5 MG/3ML) 0.083% nebulizer solution Take 3 mLs (2.5 mg total) by nebulization every 6 (six) hours as needed for wheezing or shortness of breath. 90 mL 3   albuterol  (VENTOLIN  HFA) 108 (90 Base) MCG/ACT inhaler Inhale 2 puffs into the lungs every 6 (six) hours as needed for wheezing or shortness of breath. 3 Inhaler 3   allopurinol  (ZYLOPRIM ) 300 MG tablet Take 1 tablet (300 mg total) by mouth daily. 90 tablet 4   amLODipine  (NORVASC ) 5 MG tablet Take 1 tablet (5 mg total) by mouth daily. 90 tablet 4   apixaban (ELIQUIS) 5 MG TABS tablet Take 5 mg by mouth 2 (two) times daily.     atorvastatin  (LIPITOR) 40 MG tablet Take 1 tablet (40 mg total) by mouth daily. 90 tablet 4   buPROPion  (WELLBUTRIN  XL) 150 MG 24 hr tablet Take 1 tablet (150 mg total) by mouth daily. 90 tablet 4   diclofenac  sodium (VOLTAREN ) 1 % GEL Apply 2 g topically daily as needed (pain). Apply to both hands.     fluticasone  (FLONASE ) 50 MCG/ACT nasal spray instill TWO SPRAYS in each nostril daily 48 g 4   furosemide  (LASIX ) 80 MG tablet Take  80 mg by mouth daily.     lisinopril  (ZESTRIL ) 20 MG tablet Take 20 mg by mouth daily.     metaxalone  (SKELAXIN ) 800 MG tablet Take 1 tablet (800 mg total) by mouth 3 (three) times daily as needed for muscle spasms. 90 tablet 0   Multiple Vitamins tablet Take 1 tablet by mouth daily.     Potassium 99 MG TABS Take 1 tablet (99 mg total) by mouth daily.     sildenafil (VIAGRA) 100 MG tablet Take 100 mg by mouth as needed for erectile dysfunction.     tadalafil (CIALIS)  20 MG tablet Take 20 mg by mouth daily as needed for erectile dysfunction.     testosterone  cypionate (DEPOTESTOSTERONE CYPIONATE) 200 MG/ML injection INJECT 0.4MLS INTRAMUSCULARLY ONCE A WEEK 10 mL 0   tiotropium (SPIRIVA  HANDIHALER) 18 MCG inhalation capsule Place 1 capsule (18 mcg total) into inhaler and inhale daily. 90 capsule 4   triamterene -hydrochlorothiazide  (MAXZIDE-25) 37.5-25 MG tablet TAKE ONE TABLET BY MOUTH BEFORE BREAKFAST 90 tablet 4   zolpidem  (AMBIEN ) 10 MG tablet TAKE 1 TABLET(10 MG) BY MOUTH AT BEDTIME AS NEEDED FOR SLEEP 30 tablet 0   traMADol  (ULTRAM ) 50 MG tablet Take 1 tablet (50 mg total) by mouth every 12 (twelve) hours as needed for moderate pain. 20 tablet 0   diazepam  (VALIUM ) 10 MG tablet Take 30 minutes before procedure 1 tablet 0   predniSONE  (DELTASONE ) 20 MG tablet 2 tabs po daily for 5 days, then 1 tab po daily for 5 days 15 tablet 0   No facility-administered medications prior to visit.     Per HPI unless specifically indicated in ROS section below Review of Systems  Objective:  BP 124/70   Pulse 74   Temp 97.9 F (36.6 C) (Oral)   Ht 5\' 10"  (1.778 m)   Wt 233 lb 8 oz (105.9 kg)   SpO2 97%   BMI 33.50 kg/m   Wt Readings from Last 3 Encounters:  11/30/23 233 lb 8 oz (105.9 kg)  11/29/23 225 lb (102.1 kg)  10/27/23 232 lb 4 oz (105.3 kg)      Physical Exam Vitals and nursing note reviewed.  Constitutional:      Appearance: Normal appearance. He is not ill-appearing.     Comments: Antalgic gait due to hip pain   HENT:     Mouth/Throat:     Mouth: Mucous membranes are moist.     Pharynx: Oropharynx is clear. No oropharyngeal exudate or posterior oropharyngeal erythema.  Eyes:     Extraocular Movements: Extraocular movements intact.     Pupils: Pupils are equal, round, and reactive to light.  Cardiovascular:     Rate and Rhythm: Normal rate and regular rhythm.     Pulses: Normal pulses.     Heart sounds: Murmur (3/6 systolic USB) heard.   Pulmonary:     Effort: Pulmonary effort is normal. No respiratory distress.     Breath sounds: Normal breath sounds. No wheezing, rhonchi or rales.  Musculoskeletal:     Right lower leg: Edema (tr) present.     Left lower leg: Edema (tr) present.  Skin:    General: Skin is warm and dry.     Findings: No rash.  Neurological:     Mental Status: He is alert.  Psychiatric:        Mood and Affect: Mood normal.        Behavior: Behavior normal.       Results for orders  placed or performed in visit on 11/30/23  Hemoglobin A1c   Collection Time: 11/30/23  2:42 PM  Result Value Ref Range   Hgb A1c MFr Bld 7.4 (H) 4.6 - 6.5 %  CBC with Differential/Platelet   Collection Time: 11/30/23  2:42 PM  Result Value Ref Range   WBC 7.1 4.0 - 10.5 K/uL   RBC 5.20 4.22 - 5.81 Mil/uL   Hemoglobin 15.8 13.0 - 17.0 g/dL   HCT 30.8 65.7 - 84.6 %   MCV 93.6 78.0 - 100.0 fl   MCHC 32.5 30.0 - 36.0 g/dL   RDW 96.2 (H) 95.2 - 84.1 %   Platelets 276.0 150.0 - 400.0 K/uL   Neutrophils Relative % 62.9 43.0 - 77.0 %   Lymphocytes Relative 21.7 12.0 - 46.0 %   Monocytes Relative 9.2 3.0 - 12.0 %   Eosinophils Relative 3.4 0.0 - 5.0 %   Basophils Relative 2.8 0.0 - 3.0 %   Neutro Abs 4.5 1.4 - 7.7 K/uL   Lymphs Abs 1.5 0.7 - 4.0 K/uL   Monocytes Absolute 0.7 0.1 - 1.0 K/uL   Eosinophils Absolute 0.2 0.0 - 0.7 K/uL   Basophils Absolute 0.2 (H) 0.0 - 0.1 K/uL  Comprehensive metabolic panel with GFR   Collection Time: 12/01/23 11:40 AM  Result Value Ref Range   Sodium 135 135 - 145 mEq/L   Potassium 4.4 3.5 - 5.1 mEq/L   Chloride 102 96 - 112 mEq/L   CO2 27 19 - 32 mEq/L   Glucose, Bld 171 (H) 70 - 99 mg/dL   BUN 30 (H) 6 - 23 mg/dL   Creatinine, Ser 3.24 0.40 - 1.50 mg/dL   Total Bilirubin 0.8 0.2 - 1.2 mg/dL   Alkaline Phosphatase 95 39 - 117 U/L   AST 15 0 - 37 U/L   ALT 12 0 - 53 U/L   Total Protein 6.2 6.0 - 8.3 g/dL   Albumin 3.8 3.5 - 5.2 g/dL   GFR 40.10 (L) >27.25 mL/min   Calcium   8.9 8.4 - 10.5 mg/dL   Echocardiogram 09/6642:  MILD LEFT VENTRICULAR SYSTOLIC DYSFUNCTION WITH NO LVH  ESTIMATED EF: 45%  DIASTOLIC FUNCTION CAN'T BE DETERMINED  NORMAL RIGHT VENTRICULAR SYSTOLIC FUNCTION  VALVULAR REGURGITATION: No AR, MODERATE MR, TRIVIAL PR, MILD TR  NO VALVULAR STENOSIS   EKG today - LBBB, rate of 60s, unchanged from prior.   DG Chest 2 View CLINICAL DATA:  Preop evaluation.  EXAM: CHEST - 2 VIEW  COMPARISON:  01/21/2023.  FINDINGS: Cardiac silhouette borderline enlarged. Stable implantable cardiac device overlies the left heart. No mediastinal or hilar masses. No evidence of adenopathy.  Clear lungs.  No pleural effusion or pneumothorax.  Skeletal structures are intact.  IMPRESSION: No active cardiopulmonary disease.  Electronically Signed   By: Amanda Jungling M.D.   On: 12/02/2023 09:47    Assessment & Plan:   Problem List Items Addressed This Visit     Type 2 diabetes mellitus with other specified complication (HCC)   Update A1c with other labs.       Relevant Orders   Comprehensive metabolic panel with GFR (Completed)   Essential (primary) hypertension   Chronic, great control on current regimen.       Relevant Orders   Comprehensive metabolic panel with GFR (Completed)   OSA (obstructive sleep apnea)   Continue perioperative CPAP use.      Relevant Orders   Comprehensive metabolic panel with GFR (Completed)   Moderate mitral insufficiency  Stable period with latest echo 07/2023 showing mod MR.       Relevant Orders   Comprehensive metabolic panel with GFR (Completed)   Stage 3 severe COPD by GOLD classification (HCC)   Chronic, stable period on spiriva  with PRN albuterol .       Relevant Orders   Comprehensive metabolic panel with GFR (Completed)   LBBB (left bundle branch block)   Chronic issue.       Relevant Orders   Comprehensive metabolic panel with GFR (Completed)   Chronic atrial fibrillation (HCC)    Continues eliquis  followed by [facility] cardiology.       Relevant Orders   Comprehensive metabolic panel with GFR (Completed)   Sick sinus syndrome [facility])   S/p pacemaker placement.       Relevant Orders   Comprehensive metabolic panel with GFR (Completed)   Status post placement of cardiac pacemaker   Relevant Orders   Comprehensive metabolic panel with GFR (Completed)   Pre-op evaluation - Primary   RCRI = 1 (h/o CHF).  Cardiac clearance and perioperative AC management recommendations to come through his cardiologist at [facility] (last seen 06/2023).  Check EKG, CXR, labs today.  Anticipate adequately low risk to proceed with planned hip replacement.  Will forward results to requesting provider's office.       Relevant Orders   EKG 12-Lead (Completed)   Hemoglobin A1c (Completed)   CBC with Differential/Platelet (Completed)   DG Chest 2 View (Completed)   Comprehensive metabolic panel with GFR (Completed)   Primary osteoarthritis of right hip   Appreciate ortho care.  Planned R hip replacement.  Pt requests pain medication in interim - will Rx tramadol  for breakthrough pain, reviewed sedation precautions and side effects to watch for.       Relevant Medications   traMADol  (ULTRAM ) 50 MG tablet   Other Relevant Orders   Comprehensive metabolic panel with GFR (Completed)     Meds ordered this encounter  Medications   traMADol  (ULTRAM ) 50 MG tablet    Sig: Take 1 tablet (50 mg total) by mouth every 12 (twelve) hours as needed for moderate pain (pain score 4-6).    Dispense:  20 tablet    Refill:  0    Orders Placed This Encounter  Procedures   DG Chest 2 View    Standing Status:   Future    Number of Occurrences:   1    Expiration Date:   11/29/2024    Reason for Exam (SYMPTOM  OR DIAGNOSIS REQUIRED):   preop eval    Preferred imaging location?:   [place]   Hemoglobin A1c   CBC with Differential/Platelet   Comprehensive metabolic panel with GFR     Standing Status:   Future    Number of Occurrences:   1    Expiration Date:   12/16/2023   EKG 12-Lead    Patient Instructions  EKG, xray, labs today  We will forward results to Dr Larwance Poe office for medical clearance.  Cardiac clearance to come from [facility].  Good to see you today.   Follow up plan: No follow-ups on file.  Claire Crick, MD

## 2023-12-01 ENCOUNTER — Encounter: Payer: Self-pay | Admitting: Family Medicine

## 2023-12-01 DIAGNOSIS — M1611 Unilateral primary osteoarthritis, right hip: Secondary | ICD-10-CM | POA: Insufficient documentation

## 2023-12-01 LAB — CBC WITH DIFFERENTIAL/PLATELET
Basophils Absolute: 0.2 10*3/uL — ABNORMAL HIGH (ref 0.0–0.1)
Basophils Relative: 2.8 % (ref 0.0–3.0)
Eosinophils Absolute: 0.2 10*3/uL (ref 0.0–0.7)
Eosinophils Relative: 3.4 % (ref 0.0–5.0)
HCT: 48.7 % (ref 39.0–52.0)
Hemoglobin: 15.8 g/dL (ref 13.0–17.0)
Lymphocytes Relative: 21.7 % (ref 12.0–46.0)
Lymphs Abs: 1.5 10*3/uL (ref 0.7–4.0)
MCHC: 32.5 g/dL (ref 30.0–36.0)
MCV: 93.6 fl (ref 78.0–100.0)
Monocytes Absolute: 0.7 10*3/uL (ref 0.1–1.0)
Monocytes Relative: 9.2 % (ref 3.0–12.0)
Neutro Abs: 4.5 10*3/uL (ref 1.4–7.7)
Neutrophils Relative %: 62.9 % (ref 43.0–77.0)
Platelets: 276 10*3/uL (ref 150.0–400.0)
RBC: 5.2 Mil/uL (ref 4.22–5.81)
RDW: 18.1 % — ABNORMAL HIGH (ref 11.5–15.5)
WBC: 7.1 10*3/uL (ref 4.0–10.5)

## 2023-12-01 LAB — COMPREHENSIVE METABOLIC PANEL WITH GFR
ALT: 12 U/L (ref 0–53)
AST: 15 U/L (ref 0–37)
Albumin: 3.8 g/dL (ref 3.5–5.2)
Alkaline Phosphatase: 95 U/L (ref 39–117)
BUN: 30 mg/dL — ABNORMAL HIGH (ref 6–23)
CO2: 27 meq/L (ref 19–32)
Calcium: 8.9 mg/dL (ref 8.4–10.5)
Chloride: 102 meq/L (ref 96–112)
Creatinine, Ser: 1.28 mg/dL (ref 0.40–1.50)
GFR: 54.68 mL/min — ABNORMAL LOW (ref >60.00)
Glucose, Bld: 171 mg/dL — ABNORMAL HIGH (ref 70–99)
Potassium: 4.4 meq/L (ref 3.5–5.1)
Sodium: 135 meq/L (ref 135–145)
Total Bilirubin: 0.8 mg/dL (ref 0.2–1.2)
Total Protein: 6.2 g/dL (ref 6.0–8.3)

## 2023-12-01 LAB — HEMOGLOBIN A1C: Hgb A1c MFr Bld: 7.4 % — ABNORMAL HIGH (ref 4.6–6.5)

## 2023-12-01 NOTE — Assessment & Plan Note (Addendum)
 Continues eliquis  followed by [facility] cardiology.

## 2023-12-01 NOTE — Assessment & Plan Note (Signed)
 Stable period with latest echo 07/2023 showing mod MR.

## 2023-12-01 NOTE — Assessment & Plan Note (Addendum)
 RCRI = 1 (h/o CHF).  Cardiac clearance and perioperative AC management recommendations to come through his cardiologist at [facility] (last seen 06/2023).  Check EKG, CXR, labs today.  Anticipate adequately low risk to proceed with planned hip replacement.  Will forward results to requesting provider's office.

## 2023-12-01 NOTE — Addendum Note (Signed)
 Addended by: Gerry Krone on: 12/01/2023 11:06 AM   Modules accepted: Orders

## 2023-12-01 NOTE — Assessment & Plan Note (Signed)
 Continue perioperative CPAP use.

## 2023-12-01 NOTE — Assessment & Plan Note (Addendum)
Update A1c with other labs.

## 2023-12-01 NOTE — Assessment & Plan Note (Addendum)
 Appreciate ortho care.  Planned R hip replacement.  Pt requests pain medication in interim - will Rx tramadol  for breakthrough pain, reviewed sedation precautions and side effects to watch for.

## 2023-12-01 NOTE — Assessment & Plan Note (Signed)
 S/p pacemaker placement.

## 2023-12-01 NOTE — Assessment & Plan Note (Signed)
Chronic, great control on current regimen.  

## 2023-12-01 NOTE — Assessment & Plan Note (Signed)
 Chronic, stable period on spiriva  with PRN albuterol .

## 2023-12-02 ENCOUNTER — Encounter: Payer: Self-pay | Admitting: Family Medicine

## 2023-12-03 ENCOUNTER — Encounter: Payer: Self-pay | Admitting: Family Medicine

## 2023-12-07 DIAGNOSIS — I495 Sick sinus syndrome: Secondary | ICD-10-CM | POA: Diagnosis not present

## 2023-12-08 ENCOUNTER — Other Ambulatory Visit: Payer: Self-pay | Admitting: Family Medicine

## 2023-12-08 DIAGNOSIS — G8929 Other chronic pain: Secondary | ICD-10-CM

## 2023-12-10 DIAGNOSIS — Z961 Presence of intraocular lens: Secondary | ICD-10-CM | POA: Diagnosis not present

## 2023-12-10 DIAGNOSIS — E119 Type 2 diabetes mellitus without complications: Secondary | ICD-10-CM | POA: Diagnosis not present

## 2023-12-10 DIAGNOSIS — H43812 Vitreous degeneration, left eye: Secondary | ICD-10-CM | POA: Diagnosis not present

## 2023-12-10 LAB — HM DIABETES EYE EXAM

## 2023-12-10 NOTE — Telephone Encounter (Addendum)
 See 12/03/23 pt message about rx request, per insurance. Only allowed 7-day supply for 1st rx.   Plz address in Dr Ocie Belt absence.

## 2023-12-10 NOTE — Telephone Encounter (Addendum)
 See 12/08/23 refill note. Plz address rx portion of message in Dr Ocie Belt absence, if you have not already.

## 2023-12-10 NOTE — Telephone Encounter (Signed)
 Sent. Thanks.

## 2023-12-10 NOTE — Telephone Encounter (Signed)
 Noted.

## 2023-12-13 NOTE — Telephone Encounter (Signed)
 I previously sent the tramadol  refill.  Thanks.

## 2024-01-24 ENCOUNTER — Other Ambulatory Visit: Payer: Self-pay | Admitting: Family Medicine

## 2024-01-24 DIAGNOSIS — G8929 Other chronic pain: Secondary | ICD-10-CM

## 2024-01-25 NOTE — Telephone Encounter (Signed)
 Name of Medication:  Tramadol  Name of Pharmacy:  [facility] [address] Last Fill or Written Date and Quantity:  12/10/23, #20 Last Office Visit and Type:  11/30/23, pre-op exam Next Office Visit and Type:  none Last Controlled Substance Agreement Date:  none Last UDS:  none

## 2024-01-27 DIAGNOSIS — Z7901 Long term (current) use of anticoagulants: Secondary | ICD-10-CM | POA: Diagnosis not present

## 2024-01-27 DIAGNOSIS — Z950 Presence of cardiac pacemaker: Secondary | ICD-10-CM | POA: Diagnosis not present

## 2024-01-27 DIAGNOSIS — E785 Hyperlipidemia, unspecified: Secondary | ICD-10-CM | POA: Diagnosis not present

## 2024-01-27 DIAGNOSIS — E669 Obesity, unspecified: Secondary | ICD-10-CM | POA: Diagnosis not present

## 2024-01-27 DIAGNOSIS — Z6833 Body mass index (BMI) 33.0-33.9, adult: Secondary | ICD-10-CM | POA: Diagnosis not present

## 2024-01-27 DIAGNOSIS — N1831 Chronic kidney disease, stage 3a: Secondary | ICD-10-CM | POA: Diagnosis not present

## 2024-01-27 DIAGNOSIS — Z87891 Personal history of nicotine dependence: Secondary | ICD-10-CM | POA: Diagnosis not present

## 2024-01-27 DIAGNOSIS — M109 Gout, unspecified: Secondary | ICD-10-CM | POA: Diagnosis not present

## 2024-01-27 DIAGNOSIS — E1122 Type 2 diabetes mellitus with diabetic chronic kidney disease: Secondary | ICD-10-CM | POA: Diagnosis not present

## 2024-01-27 DIAGNOSIS — M1612 Unilateral primary osteoarthritis, left hip: Secondary | ICD-10-CM | POA: Diagnosis not present

## 2024-01-27 DIAGNOSIS — M25752 Osteophyte, left hip: Secondary | ICD-10-CM | POA: Diagnosis not present

## 2024-01-27 DIAGNOSIS — I129 Hypertensive chronic kidney disease with stage 1 through stage 4 chronic kidney disease, or unspecified chronic kidney disease: Secondary | ICD-10-CM | POA: Diagnosis not present

## 2024-01-27 HISTORY — PX: TOTAL HIP ARTHROPLASTY: SHX124

## 2024-01-27 NOTE — Telephone Encounter (Signed)
 ERx

## 2024-02-10 DIAGNOSIS — Z96642 Presence of left artificial hip joint: Secondary | ICD-10-CM | POA: Diagnosis not present

## 2024-02-10 DIAGNOSIS — M25552 Pain in left hip: Secondary | ICD-10-CM | POA: Diagnosis not present

## 2024-02-14 DIAGNOSIS — Z96642 Presence of left artificial hip joint: Secondary | ICD-10-CM | POA: Diagnosis not present

## 2024-02-14 DIAGNOSIS — M25552 Pain in left hip: Secondary | ICD-10-CM | POA: Diagnosis not present

## 2024-02-17 DIAGNOSIS — M25552 Pain in left hip: Secondary | ICD-10-CM | POA: Diagnosis not present

## 2024-02-17 DIAGNOSIS — Z96642 Presence of left artificial hip joint: Secondary | ICD-10-CM | POA: Diagnosis not present

## 2024-02-20 ENCOUNTER — Other Ambulatory Visit: Payer: Self-pay | Admitting: Urology

## 2024-02-21 DIAGNOSIS — M25552 Pain in left hip: Secondary | ICD-10-CM | POA: Diagnosis not present

## 2024-02-21 DIAGNOSIS — Z96642 Presence of left artificial hip joint: Secondary | ICD-10-CM | POA: Diagnosis not present

## 2024-02-25 DIAGNOSIS — M25552 Pain in left hip: Secondary | ICD-10-CM | POA: Diagnosis not present

## 2024-02-25 DIAGNOSIS — Z96642 Presence of left artificial hip joint: Secondary | ICD-10-CM | POA: Diagnosis not present

## 2024-03-14 ENCOUNTER — Encounter: Payer: Self-pay | Admitting: Family Medicine

## 2024-03-17 ENCOUNTER — Encounter: Payer: Self-pay | Admitting: Family Medicine

## 2024-03-17 ENCOUNTER — Ambulatory Visit (INDEPENDENT_AMBULATORY_CARE_PROVIDER_SITE_OTHER): Admitting: Family Medicine

## 2024-03-17 ENCOUNTER — Ambulatory Visit: Payer: Self-pay | Admitting: Family Medicine

## 2024-03-17 VITALS — BP 116/58 | HR 63 | Temp 97.8°F | Ht 70.0 in | Wt 228.0 lb

## 2024-03-17 DIAGNOSIS — Z7984 Long term (current) use of oral hypoglycemic drugs: Secondary | ICD-10-CM

## 2024-03-17 DIAGNOSIS — E1129 Type 2 diabetes mellitus with other diabetic kidney complication: Secondary | ICD-10-CM

## 2024-03-17 DIAGNOSIS — G4733 Obstructive sleep apnea (adult) (pediatric): Secondary | ICD-10-CM | POA: Diagnosis not present

## 2024-03-17 DIAGNOSIS — E1169 Type 2 diabetes mellitus with other specified complication: Secondary | ICD-10-CM | POA: Diagnosis not present

## 2024-03-17 DIAGNOSIS — N183 Chronic kidney disease, stage 3 unspecified: Secondary | ICD-10-CM

## 2024-03-17 DIAGNOSIS — I340 Nonrheumatic mitral (valve) insufficiency: Secondary | ICD-10-CM

## 2024-03-17 DIAGNOSIS — E1122 Type 2 diabetes mellitus with diabetic chronic kidney disease: Secondary | ICD-10-CM | POA: Diagnosis not present

## 2024-03-17 DIAGNOSIS — R809 Proteinuria, unspecified: Secondary | ICD-10-CM | POA: Diagnosis not present

## 2024-03-17 DIAGNOSIS — J441 Chronic obstructive pulmonary disease with (acute) exacerbation: Secondary | ICD-10-CM | POA: Diagnosis not present

## 2024-03-17 DIAGNOSIS — J449 Chronic obstructive pulmonary disease, unspecified: Secondary | ICD-10-CM

## 2024-03-17 LAB — MICROALBUMIN / CREATININE URINE RATIO
Creatinine,U: 100.4 mg/dL
Microalb Creat Ratio: 8.6 mg/g (ref 0.0–30.0)
Microalb, Ur: 0.9 mg/dL (ref 0.0–1.9)

## 2024-03-17 LAB — POCT GLYCOSYLATED HEMOGLOBIN (HGB A1C): Hemoglobin A1C: 6.6 % — AB (ref 4.0–5.6)

## 2024-03-17 MED ORDER — LANCET DEVICE MISC: 1.0000 | Freq: Three times a day (TID) | 0 refills | Status: AC

## 2024-03-17 MED ORDER — DOXYCYCLINE HYCLATE 100 MG PO TABS: 100.0000 mg | ORAL_TABLET | Freq: Two times a day (BID) | ORAL | 0 refills | Status: DC

## 2024-03-17 MED ORDER — BLOOD GLUCOSE MONITORING SUPPL DEVI
1.0000 | Freq: Three times a day (TID) | 0 refills | Status: DC
Start: 2024-03-17 — End: 2024-06-08

## 2024-03-17 MED ORDER — LANCETS MISC. MISC: 1.0000 | Freq: Three times a day (TID) | 0 refills | Status: AC

## 2024-03-17 MED ORDER — BLOOD GLUCOSE TEST VI STRP
1.0000 | ORAL_STRIP | Freq: Three times a day (TID) | 0 refills | Status: AC
Start: 2024-03-17 — End: 2024-04-16

## 2024-03-17 MED ORDER — METFORMIN HCL 500 MG PO TABS: 500.0000 mg | ORAL_TABLET | Freq: Every day | ORAL | 1 refills | Status: DC

## 2024-03-17 NOTE — Telephone Encounter (Signed)
 Spoke with patient at his office visit today.

## 2024-03-17 NOTE — Assessment & Plan Note (Signed)
 Update Umicroalb.  ACEI intolerance (cough) also with cough to losartan . Consider other ARB.  Consider SGLT2i.

## 2024-03-17 NOTE — Addendum Note (Signed)
 Addended by: RILLA BALLER on: 03/17/2024 10:54 AM   Modules accepted: Orders

## 2024-03-17 NOTE — Assessment & Plan Note (Addendum)
 Stable known h/o this.  Reviewed latest echo 07/2023: MILD LEFT VENTRICULAR SYSTOLIC DYSFUNCTION WITH NO LVH  ESTIMATED EF: 45%  DIASTOLIC FUNCTION CAN'T BE DETERMINED  NORMAL RIGHT VENTRICULAR SYSTOLIC FUNCTION  VALVULAR REGURGITATION: No AR, MODERATE MR, TRIVIAL PR, MILD TR  NO VALVULAR STENOSIS

## 2024-03-17 NOTE — Assessment & Plan Note (Signed)
 Cough, exertional dyspnea, increased wheezing and sputum production present over the past several weeks consistent with COPD exacerbation. Rx doxycycline  antibiotic course. Rec restart Spiriva  nightly controller inhaler, continue PRN albuterol .  Avoiding prednisone  in h/o diabetes and recent poorly healing hip incision, but discussed would treat if not improving with above.  Not consistent with pneumonia.  Pt agrees with plan.

## 2024-03-17 NOTE — Progress Notes (Addendum)
 Ph: [phone] Fax: [phone]   Patient ID: Aaron Mitchell, male    DOB: 10-15-1945, 78 y.o.   MRN: [id number]  This visit was conducted in person.  BP (!) 116/58   Pulse 63   Temp 97.8 F (36.6 C) (Oral)   Ht 5' 10 (1.778 m)   Wt 228 lb (103.4 kg)   SpO2 100%   BMI 32.71 kg/m   BP Readings from Last 3 Encounters:  03/17/24 (!) 116/58  11/30/23 124/70  11/29/23 100/60   Chief Complaint  Patient presents with   Cough    Pt C/o Cough Started 5-6 wks ago. Also has tightness in chest and SOB.  Pt accompanied by [name]    Subjective:   Discussed the use of AI scribe software for clinical note transcription with the patient, who gave verbal consent to proceed.  History of Present Illness   Aaron Mitchell is a 78 year old male with COPD who presents with a persistent cough.  He has experienced a persistent cough for four to five weeks, with chest tightness and increased clear mucus production. Wheezing and shortness of breath occur, especially with exertion. He denies fever, chills, or upper respiratory symptoms such as head congestion or sore throat.  He has prescriptions for Spiriva  and PRN albuterol  inhaler/nebulizer for COPD but has not used Spiriva  recently and uses albuterol  with a nebulizer infrequently. Over-the-counter decongestants and antihistamines have not provided relief.  He is recovering from a hip replacement, with the left hip incision still healing, and is working on strengthening his left leg. He also has a mild mitral valve defect, last evaluated with an echocardiogram in 2022.      Recent L hip replacement surgery by Dr Leora 12/2023.   Known atrial fibrillation with pacemaker in place as well as moderate mitral regurgitation, followed by [facility] Dr Florencio      Relevant past medical, surgical, family and social history reviewed and updated as indicated. Interim medical history since our last visit reviewed. Allergies and  medications reviewed and updated. Outpatient Medications Prior to Visit  Medication Sig Dispense Refill   acetaminophen  (TYLENOL  8 HOUR ARTHRITIS PAIN) 650 MG CR tablet Take 2 tablets (1,300 mg total) by mouth 2 (two) times daily as needed for pain.     albuterol  (PROVENTIL ) (2.5 MG/3ML) 0.083% nebulizer solution Take 3 mLs (2.5 mg total) by nebulization every 6 (six) hours as needed for wheezing or shortness of breath. 90 mL 3   albuterol  (VENTOLIN  HFA) 108 (90 Base) MCG/ACT inhaler Inhale 2 puffs into the lungs every 6 (six) hours as needed for wheezing or shortness of breath. 3 Inhaler 3   allopurinol  (ZYLOPRIM ) 300 MG tablet Take 1 tablet (300 mg total) by mouth daily. 90 tablet 4   amLODipine  (NORVASC ) 5 MG tablet Take 1 tablet (5 mg total) by mouth daily. 90 tablet 4   apixaban (ELIQUIS) 5 MG TABS tablet Take 5 mg by mouth 2 (two) times daily.     atorvastatin  (LIPITOR) 40 MG tablet Take 1 tablet (40 mg total) by mouth daily. 90 tablet 4   buPROPion  (WELLBUTRIN  XL) 150 MG 24 hr tablet Take 1 tablet (150 mg total) by mouth daily. 90 tablet 4   diclofenac  sodium (VOLTAREN ) 1 % GEL Apply 2 g topically daily as needed (pain). Apply to both hands.     fluticasone  (FLONASE ) 50 MCG/ACT nasal spray instill TWO SPRAYS in each nostril daily 48 g 4   furosemide  (LASIX )  80 MG tablet Take 80 mg by mouth daily.     lisinopril  (ZESTRIL ) 20 MG tablet Take 20 mg by mouth daily.     metaxalone  (SKELAXIN ) 800 MG tablet Take 1 tablet (800 mg total) by mouth 3 (three) times daily as needed for muscle spasms. 90 tablet 0   Multiple Vitamins tablet Take 1 tablet by mouth daily.     Potassium 99 MG TABS Take 1 tablet (99 mg total) by mouth daily.     sildenafil (VIAGRA) 100 MG tablet Take 100 mg by mouth as needed for erectile dysfunction.     tadalafil (CIALIS) 20 MG tablet Take 20 mg by mouth daily as needed for erectile dysfunction.     testosterone  cypionate (DEPOTESTOSTERONE CYPIONATE) 200 MG/ML injection  INJECT 0.4 ML INTRAMUSCULARLY ONCE A WEEK 10 mL 0   tiotropium (SPIRIVA  HANDIHALER) 18 MCG inhalation capsule Place 1 capsule (18 mcg total) into inhaler and inhale daily. 90 capsule 4   traMADol  (ULTRAM ) 50 MG tablet TAKE 1 TABLET BY MOUTH EVERY 12 HOURS AS NEEDED 20 tablet 0   triamterene -hydrochlorothiazide  (MAXZIDE-25) 37.5-25 MG tablet TAKE ONE TABLET BY MOUTH BEFORE BREAKFAST 90 tablet 4   zolpidem  (AMBIEN ) 10 MG tablet TAKE 1 TABLET(10 MG) BY MOUTH AT BEDTIME AS NEEDED FOR SLEEP 30 tablet 0   No facility-administered medications prior to visit.     Per HPI unless specifically indicated in ROS section below Review of Systems  Objective:  BP (!) 116/58   Pulse 63   Temp 97.8 F (36.6 C) (Oral)   Ht 5' 10 (1.778 m)   Wt 228 lb (103.4 kg)   SpO2 100%   BMI 32.71 kg/m   Wt Readings from Last 3 Encounters:  03/17/24 228 lb (103.4 kg)  11/30/23 233 lb 8 oz (105.9 kg)  11/29/23 225 lb (102.1 kg)     Physical Exam Vitals and nursing note reviewed.  Constitutional:      Appearance: Normal appearance. He is not ill-appearing.  HENT:     Head: Normocephalic and atraumatic.     Right Ear: Hearing, tympanic membrane, ear canal and external ear normal. There is no impacted cerumen.     Left Ear: Hearing, tympanic membrane, ear canal and external ear normal. There is no impacted cerumen.     Nose: Nose normal. No mucosal edema, congestion or rhinorrhea.     Right Turbinates: Not enlarged or swollen.     Left Turbinates: Not enlarged or swollen.     Right Sinus: No maxillary sinus tenderness or frontal sinus tenderness.     Left Sinus: No maxillary sinus tenderness or frontal sinus tenderness.     Mouth/Throat:     Mouth: Mucous membranes are moist.     Pharynx: Oropharynx is clear. No oropharyngeal exudate or posterior oropharyngeal erythema.  Eyes:     Extraocular Movements: Extraocular movements intact.     Conjunctiva/sclera: Conjunctivae normal.     Pupils: Pupils are  equal, round, and reactive to light.  Cardiovascular:     Rate and Rhythm: Normal rate and regular rhythm.     Pulses: Normal pulses.     Heart sounds: Murmur (2/6 systolic along L lateral sternal border) heard.  Pulmonary:     Effort: Pulmonary effort is normal. No respiratory distress.     Breath sounds: Wheezing (mild exp wheezing) present. No rhonchi or rales.     Comments: Coarse, diminished to bilat bases  Musculoskeletal:     Cervical back: Normal range of  motion and neck supple. No rigidity.     Right lower leg: No edema.     Left lower leg: No edema.  Lymphadenopathy:     Cervical: No cervical adenopathy.  Skin:    General: Skin is warm and dry.     Findings: No rash.  Neurological:     Mental Status: He is alert.  Psychiatric:        Mood and Affect: Mood normal.        Behavior: Behavior normal.          Results for orders placed or performed in visit on 03/17/24  POCT glycosylated hemoglobin (Hb A1C)   Collection Time: 03/17/24 10:36 AM  Result Value Ref Range   Hemoglobin A1C 6.6 (A) 4.0 - 5.6 %   HbA1c POC (<> result, manual entry)     HbA1c, POC (prediabetic range)     HbA1c, POC (controlled diabetic range)     Lab Results  Component Value Date   NA 135 12/01/2023   CL 102 12/01/2023   K 4.4 12/01/2023   CO2 27 12/01/2023   BUN 30 (H) 12/01/2023   CREATININE 1.28 12/01/2023   GFR 54.68 (L) 12/01/2023   CALCIUM  8.9 12/01/2023   ALBUMIN 3.8 12/01/2023   GLUCOSE 171 (H) 12/01/2023    Assessment & Plan:      COPD with acute exacerbation Steroids avoided due to blood sugar and wound healing concerns. - Prescribe doxycycline  for COPD exacerbation. - Resume nightly Spiriva . - Avoid oral steroids at this time, but low threshold to start with steroid precautions (already reviewed today) if not improving with antibiotic alone.  Type 2 diabetes mellitus A1c at 6.6 indicates improved control. Not on diabetes medication, managing with diet. - Start  metformin  500 mg daily. - Send glucose meter to [employer]. - Check fasting blood sugars a few times a week.  Chronic kidney disease Impaired kidney function likely due to diabetes and hypertension. Previous proteinuria suggests diabetic nephropathy. History of intolerance to ACEI (cough) and losartan  as well (cough) - Check urine microalbumin levels. - Consider different ARB vs SGLT2i      Problem List Items Addressed This Visit     Type 2 diabetes mellitus with other specified complication (HCC)   Update A1c, Umicroalbumin ERx glucometer - per insurance preference Low threshold to restart metformin       Relevant Orders   Microalbumin / creatinine urine ratio   POCT glycosylated hemoglobin (Hb A1C) (Completed)   Moderate mitral insufficiency   Stable known h/o this.  Reviewed latest echo 07/2023: MILD LEFT VENTRICULAR SYSTOLIC DYSFUNCTION WITH NO LVH  ESTIMATED EF: 45%  DIASTOLIC FUNCTION CAN'T BE DETERMINED  NORMAL RIGHT VENTRICULAR SYSTOLIC FUNCTION  VALVULAR REGURGITATION: No AR, MODERATE MR, TRIVIAL PR, MILD TR  NO VALVULAR STENOSIS       Stage 3 severe COPD by GOLD classification (HCC)   See above. Rec regular spiriva  use.       COPD exacerbation (HCC) - Primary   Cough, exertional dyspnea, increased wheezing and sputum production present over the past several weeks consistent with COPD exacerbation. Rx doxycycline  antibiotic course. Rec restart Spiriva  nightly controller inhaler, continue PRN albuterol .  Avoiding prednisone  in h/o diabetes and recent poorly healing hip incision, but discussed would treat if not improving with above.  Not consistent with pneumonia.  Pt agrees with plan.       Type 2 diabetes mellitus with diabetic microalbuminuria, without long-term current use of insulin (HCC)   Update  Umicroalb.  ACEI intolerance (cough) also with cough to losartan . Consider other ARB.  Consider SGLT2i.       CKD stage 3 due to type 2 diabetes mellitus  (HCC)   Discussed with patient - GFR 50s over the past year.  Anticipate diabetic nephropathy.  Update Umicroalb.  ACEi intolerance, losartan  caused cough as well. Consider different ARB.         Meds ordered this encounter  Medications   Blood Glucose Monitoring Suppl DEVI    Sig: 1 each by Does not apply route in the morning, at noon, and at bedtime. May substitute to any manufacturer covered by patient's insurance.    Dispense:  1 each    Refill:  0   Glucose Blood (BLOOD GLUCOSE TEST STRIPS) STRP    Sig: 1 each by In Vitro route in the morning, at noon, and at bedtime. May substitute to any manufacturer covered by patient's insurance.    Dispense:  100 strip    Refill:  0   Lancet Device MISC    Sig: 1 each by Does not apply route in the morning, at noon, and at bedtime. May substitute to any manufacturer covered by patient's insurance.    Dispense:  1 each    Refill:  0   Lancets Misc. MISC    Sig: 1 each by Does not apply route in the morning, at noon, and at bedtime. May substitute to any manufacturer covered by patient's insurance.    Dispense:  100 each    Refill:  0   doxycycline  (VIBRA -TABS) 100 MG tablet    Sig: Take 1 tablet (100 mg total) by mouth 2 (two) times daily.    Dispense:  20 tablet    Refill:  0    Orders Placed This Encounter  Procedures   Microalbumin / creatinine urine ratio   POCT glycosylated hemoglobin (Hb A1C)    Patient Instructions  POC A1c today 6.6% Urine microalb   VISIT SUMMARY: During your visit, we discussed your persistent cough and other symptoms related to your COPD, as well as your diabetes and kidney function. We have made some changes to your medications and provided instructions to help manage your conditions.  YOUR PLAN: -COPD WITH ACUTE EXACERBATION: COPD, or Chronic Obstructive Pulmonary Disease, is a lung condition that makes it hard to breathe. You are experiencing an acute exacerbation, which means your symptoms have  suddenly worsened. We are prescribing doxycycline  to help with this flare-up and advising you to resume using your nightly Spiriva  inhaler. We are avoiding oral steroids to prevent any issues with your blood sugar and wound healing.  -TYPE 2 DIABETES MELLITUS: Type 2 diabetes is a condition where your body does not use insulin properly, leading to high blood sugar levels. Your A1c level of 6.6 indicates that your blood sugar is not well-controlled. We are starting you on metformin  500 mg daily to help manage your blood sugar levels. Additionally, we are sending a glucose meter to your pharmacy and asking you to check your fasting blood sugars a few times a week or as needed.  -CHRONIC KIDNEY DISEASE: Chronic kidney disease means your kidneys are not working as well as they should. This is likely due to your diabetes and high blood pressure. We will check your urine microalbumin levels to monitor your kidney function.  INSTRUCTIONS: Take antibiotic as instructed, start nightly spiriva   controller inhaler use.  Follow up if not improving with treatment.  Schedule physical/wellness visit for  after 04/26/2024.   Follow up plan: Return in about 6 weeks (around 04/28/2024) for annual exam, prior fasting for blood work.  Anton Blas, MD

## 2024-03-17 NOTE — Assessment & Plan Note (Signed)
 See above. Rec regular spiriva  use.

## 2024-03-17 NOTE — Assessment & Plan Note (Addendum)
 Update A1c, Umicroalbumin ERx glucometer - per insurance preference Low threshold to restart metformin 

## 2024-03-17 NOTE — Assessment & Plan Note (Addendum)
 Discussed with patient - GFR 50s over the past year.  Anticipate diabetic nephropathy.  Update Umicroalb.  ACEi intolerance, losartan  caused cough as well. Consider different ARB.

## 2024-03-17 NOTE — Patient Instructions (Addendum)
 POC A1c today 6.6% Urine microalb   VISIT SUMMARY: During your visit, we discussed your persistent cough and other symptoms related to your COPD, as well as your diabetes and kidney function. We have made some changes to your medications and provided instructions to help manage your conditions.  YOUR PLAN: -COPD WITH ACUTE EXACERBATION: COPD, or Chronic Obstructive Pulmonary Disease, is a lung condition that makes it hard to breathe. You are experiencing an acute exacerbation, which means your symptoms have suddenly worsened. We are prescribing doxycycline  to help with this flare-up and advising you to resume using your nightly Spiriva  inhaler. We are avoiding oral steroids to prevent any issues with your blood sugar and wound healing.  -TYPE 2 DIABETES MELLITUS: Type 2 diabetes is a condition where your body does not use insulin properly, leading to high blood sugar levels. Your A1c level of 6.6 indicates that your blood sugar is not well-controlled. We are starting you on metformin  500 mg daily to help manage your blood sugar levels. Additionally, we are sending a glucose meter to your pharmacy and asking you to check your fasting blood sugars a few times a week or as needed.  -CHRONIC KIDNEY DISEASE: Chronic kidney disease means your kidneys are not working as well as they should. This is likely due to your diabetes and high blood pressure. We will check your urine microalbumin levels to monitor your kidney function.  INSTRUCTIONS: Take antibiotic as instructed, start nightly spiriva   controller inhaler use.  Follow up if not improving with treatment.  Schedule physical/wellness visit for after 04/26/2024.

## 2024-03-29 ENCOUNTER — Ambulatory Visit: Admitting: Physician Assistant

## 2024-04-06 DIAGNOSIS — M79675 Pain in left toe(s): Secondary | ICD-10-CM | POA: Diagnosis not present

## 2024-04-06 DIAGNOSIS — B351 Tinea unguium: Secondary | ICD-10-CM | POA: Diagnosis not present

## 2024-04-06 DIAGNOSIS — M79674 Pain in right toe(s): Secondary | ICD-10-CM | POA: Diagnosis not present

## 2024-04-06 DIAGNOSIS — E119 Type 2 diabetes mellitus without complications: Secondary | ICD-10-CM | POA: Diagnosis not present

## 2024-04-13 DIAGNOSIS — L600 Ingrowing nail: Secondary | ICD-10-CM | POA: Diagnosis not present

## 2024-04-13 DIAGNOSIS — E1140 Type 2 diabetes mellitus with diabetic neuropathy, unspecified: Secondary | ICD-10-CM | POA: Diagnosis not present

## 2024-04-17 DIAGNOSIS — M47816 Spondylosis without myelopathy or radiculopathy, lumbar region: Secondary | ICD-10-CM | POA: Diagnosis not present

## 2024-04-17 DIAGNOSIS — M48062 Spinal stenosis, lumbar region with neurogenic claudication: Secondary | ICD-10-CM | POA: Diagnosis not present

## 2024-04-17 DIAGNOSIS — M5416 Radiculopathy, lumbar region: Secondary | ICD-10-CM | POA: Diagnosis not present

## 2024-04-18 ENCOUNTER — Other Ambulatory Visit

## 2024-04-18 DIAGNOSIS — E291 Testicular hypofunction: Secondary | ICD-10-CM | POA: Diagnosis not present

## 2024-04-18 DIAGNOSIS — M26641 Arthritis of right temporomandibular joint: Secondary | ICD-10-CM | POA: Diagnosis not present

## 2024-04-18 DIAGNOSIS — H9201 Otalgia, right ear: Secondary | ICD-10-CM | POA: Diagnosis not present

## 2024-04-19 ENCOUNTER — Other Ambulatory Visit

## 2024-04-19 LAB — HEMOGLOBIN AND HEMATOCRIT, BLOOD
Hematocrit: 44.8 % (ref 37.5–51.0)
Hemoglobin: 13.8 g/dL (ref 13.0–17.7)

## 2024-04-19 LAB — TESTOSTERONE: Testosterone: 378 ng/dL (ref 264–916)

## 2024-04-24 ENCOUNTER — Ambulatory Visit (INDEPENDENT_AMBULATORY_CARE_PROVIDER_SITE_OTHER)

## 2024-04-24 DIAGNOSIS — Z23 Encounter for immunization: Secondary | ICD-10-CM

## 2024-04-24 NOTE — Progress Notes (Signed)
 Per orders of Dr. Anton Blas, injection of Prevnar-20 vaccine given by Bobbette Sprague in right deltoid. Patient tolerated injection well.

## 2024-04-24 NOTE — Progress Notes (Signed)
 Per orders of Dr. Anton Blas, injection of flu vaccine given by Bobbette Sprague in right deltoid. Patient tolerated injection well.

## 2024-04-26 DIAGNOSIS — Z950 Presence of cardiac pacemaker: Secondary | ICD-10-CM | POA: Diagnosis not present

## 2024-04-27 ENCOUNTER — Telehealth: Payer: Self-pay | Admitting: Family Medicine

## 2024-04-27 NOTE — Telephone Encounter (Signed)
 Placed POA in Dr Talmadge box.

## 2024-04-27 NOTE — Telephone Encounter (Signed)
 Patient dropped of POA for provider review placed I providers box

## 2024-05-01 ENCOUNTER — Telehealth: Payer: Self-pay | Admitting: Family Medicine

## 2024-05-01 NOTE — Telephone Encounter (Signed)
 No, I can't.  I would definitely ice the TMJ, and he could try Voltaren  gel.

## 2024-05-01 NOTE — Telephone Encounter (Signed)
 Aaron Mitchell notified as instructed by telephone.  Patient states understanding.

## 2024-05-01 NOTE — Telephone Encounter (Signed)
 Copied from CRM [id number]. Topic: Clinical - Refused Triage >> May 01, 2024 12:57 PM Rea C wrote: Patient/caller voiced complaints of right jaw pain. Declined transfer to triage.  Reason for CRM: Patient is having problem with TMJ (right jaw) and patient would like to know if Dr. Ubaldo can give him a cortisone injection for it. I asked was he in severe pain in this moment and he said that is the purpose of his call. I told him I would have him speak with NT but he declined stating he doesn't need ER or Triage. He would just like a call from Dr. Ubaldo.   [phone] (m)

## 2024-05-02 DIAGNOSIS — M47816 Spondylosis without myelopathy or radiculopathy, lumbar region: Secondary | ICD-10-CM | POA: Diagnosis not present

## 2024-06-07 ENCOUNTER — Ambulatory Visit: Payer: Self-pay | Admitting: *Deleted

## 2024-06-07 NOTE — Telephone Encounter (Signed)
 FYI Only or Action Required?: FYI only for provider: appointment scheduled on 11/13.  Patient was last seen in primary care on 03/17/2024 by Rilla Baller, MD.  Called Nurse Triage reporting Shoulder Pain.  Symptoms began about a month ago.  Interventions attempted: Rest, hydration, or home remedies.  Symptoms are: gradually worsening.  Triage Disposition: See PCP Within 2 Weeks  Patient/caregiver understands and will follow disposition?: Yes   Patient states he is having a bursitis flare- he normally gets injection for this and requests appointment with Dr Watt. Patient is going out of town and would like to be seen as soon as possible.   Copied from CRM [id number]. Topic: Clinical - Red Word Triage >> Jun 07, 2024  7:55 AM Victoria A wrote: [facility] that prompted transfer to Nurse Triage: Patient is having pain in right shoulder Reason for Disposition  Shoulder pain is a chronic symptom (recurrent or ongoing AND present > 4 weeks)  Answer Assessment - Initial Assessment Questions 1. ONSET: When did the pain start?     1 month 2. LOCATION: Where is the pain located?     Right shoulder 3. PAIN: How bad is the pain? (Scale 1-10; or mild, moderate, severe)     7/10  5. CAUSE: What do you think is causing the shoulder pain?     bursitis 6. OTHER SYMPTOMS: Do you have any other symptoms? (e.g., neck pain, swelling, rash, fever, numbness, weakness)     Sometimes- hand/arm gets numb  Protocols used: Shoulder Pain-A-AH

## 2024-06-07 NOTE — Telephone Encounter (Signed)
 He is on my schedule for tomorrow already.  I will see him then.

## 2024-06-08 ENCOUNTER — Encounter: Payer: Self-pay | Admitting: Family Medicine

## 2024-06-08 ENCOUNTER — Ambulatory Visit: Admitting: Family Medicine

## 2024-06-08 VITALS — BP 100/60 | HR 88 | Temp 98.9°F | Ht 69.5 in | Wt 231.0 lb

## 2024-06-08 DIAGNOSIS — M25511 Pain in right shoulder: Secondary | ICD-10-CM | POA: Diagnosis not present

## 2024-06-08 DIAGNOSIS — G8929 Other chronic pain: Secondary | ICD-10-CM | POA: Diagnosis not present

## 2024-06-08 DIAGNOSIS — M25512 Pain in left shoulder: Secondary | ICD-10-CM | POA: Diagnosis not present

## 2024-06-08 MED ORDER — TRIAMCINOLONE ACETONIDE 40 MG/ML IJ SUSP
40.0000 mg | Freq: Once | INTRAMUSCULAR | Status: AC
  Administered 2024-06-08: 40 mg via INTRA_ARTICULAR

## 2024-06-08 NOTE — Progress Notes (Signed)
 Aaron Mitchell T. Aaron Mihalko, MD, CAQ Sports Medicine [facility] [address] [place], [address]  Phone: [phone]  FAX: [phone]  Aaron Mitchell - 78 y.o. male  MRN [id number]  Date of Birth: 27-Sep-1945  Date: 06/08/2024  PCP: Rilla Baller, MD  Referral: Rilla Baller, MD  Chief Complaint  Patient presents with   Shoulder Pain    Bilateral   Subjective:   Aaron Mitchell is a 78 y.o. very pleasant male patient with Body mass index is 33.62 kg/m. who presents with the following:  Discussed the use of AI scribe software for clinical note transcription with the patient, who gave verbal consent to proceed.  B shoulder pain History of Present Illness Aaron Mitchell is a 78 year old male who presents with right shoulder pain and recent hip pain.  He has right shoulder pain, but he has not had any kind of specific injury.  He underwent a hip replacement and generally feels better, but experiences but he has stabbing pain in the joint after going up and down stairs, which he attributes to overuse. He has not fully regained his leg strength post-surgery.    Review of Systems is noted in the HPI, as appropriate  Objective:   BP 100/60   Pulse 88   Temp 98.9 F (37.2 C) (Temporal)   Ht 5' 9.5 (1.765 m)   Wt 231 lb (104.8 kg)   SpO2 98%   BMI 33.62 kg/m   GEN: No acute distress; alert,appropriate. PULM: Breathing comfortably in no respiratory distress PSYCH: Normally interactive.   Laboratory and Imaging Data:  Assessment and Plan:     ICD-10-CM   1. Chronic pain of both shoulders  M25.511 triamcinolone  acetonide (KENALOG -40) injection 40 mg   G89.29 triamcinolone  acetonide (KENALOG -40) injection 40 mg   M25.512      Assessment & Plan ufficient leg strength post-replacement. - Advised to avoid overuse of the hip joint.  Chronic shoulder pain. Chronic bursitis with no rotator cuff injury, pain consistent  with previous episodes.  Will do bilateral intra-articular injections for pain management.  Has been roughly 4 years since his last shoulder injection.  Intraarticular Shoulder Aspiration/Injection Procedure Note Aaron Mitchell 1946-02-17 Date of procedure: 06/08/2024  Procedure: Large Joint Aspiration / Injection of Shoulder, Intraarticular, R Indications: Pain  Procedure Details Verbal consent was obtained from the patient. Risks explained and contrasted with benefits and alternatives. Patient prepped with Chloraprep and Ethyl Chloride used for anesthesia. An intraarticular shoulder injection was performed using the posterior approach; needle placed into joint capsule without difficulty. The patient tolerated the procedure well and had decreased pain post injection. No complications. Injection: 9 cc of Lidocaine  1% and 1 mL Kenalog  40 mg. Needle: 21 gauge, 2 inch   Intraarticular Shoulder Aspiration/Injection Procedure Note, L Aaron Mitchell 07-Feb-1946 Date of procedure: 06/08/2024  Procedure: Large Joint Aspiration / Injection of Shoulder, Intraarticular Indications: Pain  Procedure Details Verbal consent was obtained from the patient. Risks explained and contrasted with benefits and alternatives. Patient prepped with Chloraprep and Ethyl Chloride used for anesthesia. An intraarticular shoulder injection was performed using the posterior approach; needle placed into joint capsule without difficulty. The patient tolerated the procedure well and had decreased pain post injection. No complications. Injection: 9 cc of Lidocaine  1% and 1 mL Kenalog  40 mg. Needle: 21 gauge, 2 inch   Medication Management during today's office visit: Meds ordered this encounter  Medications   triamcinolone  acetonide (KENALOG -40) injection 40  mg   triamcinolone  acetonide (KENALOG -40) injection 40 mg   Medications Discontinued During This Encounter  Medication Reason   Blood Glucose Monitoring Suppl DEVI  Completed Course   doxycycline  (VIBRA -TABS) 100 MG tablet Completed Course    Orders placed today for conditions managed today: No orders of the defined types were placed in this encounter.   Disposition: No follow-ups on file.  [employer] speech-to-text software was used for transcription in this dictation.  Possible transcriptional errors can occur using Animal nutritionist.   Signed,  Jacques DASEN. Kevin Space, MD   Outpatient Encounter Medications as of 06/08/2024  Medication Sig   acetaminophen  (TYLENOL  8 HOUR ARTHRITIS PAIN) 650 MG CR tablet Take 2 tablets (1,300 mg total) by mouth 2 (two) times daily as needed for pain.   albuterol  (PROVENTIL ) (2.5 MG/3ML) 0.083% nebulizer solution Take 3 mLs (2.5 mg total) by nebulization every 6 (six) hours as needed for wheezing or shortness of breath.   albuterol  (VENTOLIN  HFA) 108 (90 Base) MCG/ACT inhaler Inhale 2 puffs into the lungs every 6 (six) hours as needed for wheezing or shortness of breath.   allopurinol  (ZYLOPRIM ) 300 MG tablet Take 1 tablet (300 mg total) by mouth daily.   amLODipine  (NORVASC ) 5 MG tablet Take 1 tablet (5 mg total) by mouth daily.   apixaban (ELIQUIS) 5 MG TABS tablet Take 5 mg by mouth 2 (two) times daily.   atorvastatin  (LIPITOR) 40 MG tablet Take 1 tablet (40 mg total) by mouth daily.   Blood Glucose Monitoring Suppl (ONETOUCH VERIO FLEX SYSTEM) w/Device KIT USE TO CHECK BLOOD SUGAR IN THE MORNING, AT NOON, AND AT BEDTIME.   buPROPion  (WELLBUTRIN  XL) 150 MG 24 hr tablet Take 1 tablet (150 mg total) by mouth daily.   diclofenac  sodium (VOLTAREN ) 1 % GEL Apply 2 g topically daily as needed (pain). Apply to both hands.   fluticasone  (FLONASE ) 50 MCG/ACT nasal spray instill TWO SPRAYS in each nostril daily   furosemide  (LASIX ) 80 MG tablet Take 80 mg by mouth daily.   lisinopril  (ZESTRIL ) 20 MG tablet Take 20 mg by mouth daily.   meloxicam (MOBIC) 15 MG tablet Take 15 mg by mouth daily.   metaxalone  (SKELAXIN )  800 MG tablet Take 1 tablet (800 mg total) by mouth 3 (three) times daily as needed for muscle spasms.   metFORMIN  (GLUCOPHAGE ) 500 MG tablet Take 1 tablet (500 mg total) by mouth daily with breakfast.   Multiple Vitamins tablet Take 1 tablet by mouth daily.   Potassium 99 MG TABS Take 1 tablet (99 mg total) by mouth daily.   sildenafil (VIAGRA) 100 MG tablet Take 100 mg by mouth as needed for erectile dysfunction.   tadalafil (CIALIS) 20 MG tablet Take 20 mg by mouth daily as needed for erectile dysfunction.   testosterone  cypionate (DEPOTESTOSTERONE CYPIONATE) 200 MG/ML injection INJECT 0.4 ML INTRAMUSCULARLY ONCE A WEEK   tiotropium (SPIRIVA  HANDIHALER) 18 MCG inhalation capsule Place 1 capsule (18 mcg total) into inhaler and inhale daily.   traMADol  (ULTRAM ) 50 MG tablet TAKE 1 TABLET BY MOUTH EVERY 12 HOURS AS NEEDED   triamterene -hydrochlorothiazide  (MAXZIDE-25) 37.5-25 MG tablet TAKE ONE TABLET BY MOUTH BEFORE BREAKFAST   zolpidem  (AMBIEN ) 10 MG tablet TAKE 1 TABLET(10 MG) BY MOUTH AT BEDTIME AS NEEDED FOR SLEEP   [DISCONTINUED] Blood Glucose Monitoring Suppl DEVI 1 each by Does not apply route in the morning, at noon, and at bedtime. May substitute to any manufacturer covered by patient's insurance.   [DISCONTINUED]  doxycycline  (VIBRA -TABS) 100 MG tablet Take 1 tablet (100 mg total) by mouth 2 (two) times daily.   Facility-Administered Encounter Medications as of 06/08/2024  Medication   triamcinolone  acetonide (KENALOG -40) injection 40 mg   triamcinolone  acetonide (KENALOG -40) injection 40 mg

## 2024-06-30 ENCOUNTER — Encounter: Payer: Self-pay | Admitting: Pharmacist

## 2024-06-30 NOTE — Progress Notes (Signed)
 Pharmacy Quality Measure Review  This patient is appearing on a report for being at risk of failing the adherence measure for diabetes medications this calendar year.   Medication: metformin  500 mg Last fill date: 8/22 for 90 day supply  Insurance report was not up to date. No action needed at this time.  Medication has been refilled as of 11/24 x90 ds.

## 2024-07-03 DIAGNOSIS — M5416 Radiculopathy, lumbar region: Secondary | ICD-10-CM | POA: Diagnosis not present

## 2024-07-03 DIAGNOSIS — M48062 Spinal stenosis, lumbar region with neurogenic claudication: Secondary | ICD-10-CM | POA: Diagnosis not present

## 2024-07-07 ENCOUNTER — Other Ambulatory Visit: Payer: Self-pay | Admitting: Family Medicine

## 2024-07-07 DIAGNOSIS — M1A9XX0 Chronic gout, unspecified, without tophus (tophi): Secondary | ICD-10-CM

## 2024-07-07 DIAGNOSIS — I10 Essential (primary) hypertension: Secondary | ICD-10-CM

## 2024-07-07 DIAGNOSIS — F321 Major depressive disorder, single episode, moderate: Secondary | ICD-10-CM

## 2024-07-08 NOTE — Telephone Encounter (Signed)
 ERx 66mo supply to [facility] pharmacy  Please schedule CPE with me as due (last done 04/2023).

## 2024-07-10 ENCOUNTER — Other Ambulatory Visit: Payer: Self-pay | Admitting: Urology

## 2024-07-12 NOTE — Telephone Encounter (Signed)
 Called and schedule pt for Cpe / labs

## 2024-07-17 ENCOUNTER — Encounter: Payer: Self-pay | Admitting: Family Medicine

## 2024-08-17 ENCOUNTER — Telehealth: Payer: Self-pay

## 2024-08-17 NOTE — Telephone Encounter (Signed)
 Copied from CRM #[id number]. Topic: Clinical - Medication Question >> Aug 17, 2024  2:07 PM Mercedes MATSU wrote: Reason for CRM: Patient called in requesting that for future refills he would like 90 day supplies of his medication sent to the updated [facility] pharmacy on file. No cb is needed.

## 2024-08-21 ENCOUNTER — Other Ambulatory Visit

## 2024-08-21 ENCOUNTER — Other Ambulatory Visit: Payer: Self-pay | Admitting: Family Medicine

## 2024-08-21 DIAGNOSIS — M1A9XX0 Chronic gout, unspecified, without tophus (tophi): Secondary | ICD-10-CM

## 2024-08-21 DIAGNOSIS — E1122 Type 2 diabetes mellitus with diabetic chronic kidney disease: Secondary | ICD-10-CM

## 2024-08-21 DIAGNOSIS — I482 Chronic atrial fibrillation, unspecified: Secondary | ICD-10-CM

## 2024-08-21 DIAGNOSIS — E1169 Type 2 diabetes mellitus with other specified complication: Secondary | ICD-10-CM

## 2024-08-21 DIAGNOSIS — G2581 Restless legs syndrome: Secondary | ICD-10-CM

## 2024-08-21 NOTE — Telephone Encounter (Addendum)
 Noted. Will discuss at upcoming appt  next month

## 2024-08-23 ENCOUNTER — Other Ambulatory Visit

## 2024-08-23 DIAGNOSIS — G2581 Restless legs syndrome: Secondary | ICD-10-CM | POA: Diagnosis not present

## 2024-08-23 DIAGNOSIS — E1169 Type 2 diabetes mellitus with other specified complication: Secondary | ICD-10-CM

## 2024-08-23 DIAGNOSIS — N183 Chronic kidney disease, stage 3 unspecified: Secondary | ICD-10-CM

## 2024-08-23 DIAGNOSIS — E1122 Type 2 diabetes mellitus with diabetic chronic kidney disease: Secondary | ICD-10-CM | POA: Diagnosis not present

## 2024-08-23 DIAGNOSIS — M1A9XX0 Chronic gout, unspecified, without tophus (tophi): Secondary | ICD-10-CM | POA: Diagnosis not present

## 2024-08-23 DIAGNOSIS — E785 Hyperlipidemia, unspecified: Secondary | ICD-10-CM

## 2024-08-23 DIAGNOSIS — I482 Chronic atrial fibrillation, unspecified: Secondary | ICD-10-CM

## 2024-08-23 LAB — COMPREHENSIVE METABOLIC PANEL WITH GFR
ALT: 10 U/L (ref 3–53)
AST: 16 U/L (ref 5–37)
Albumin: 4.2 g/dL (ref 3.5–5.2)
Alkaline Phosphatase: 88 U/L (ref 39–117)
BUN: 35 mg/dL — ABNORMAL HIGH (ref 6–23)
CO2: 31 meq/L (ref 19–32)
Calcium: 9.1 mg/dL (ref 8.4–10.5)
Chloride: 101 meq/L (ref 96–112)
Creatinine, Ser: 2.03 mg/dL — ABNORMAL HIGH (ref 0.40–1.50)
GFR: 31.28 mL/min — ABNORMAL LOW
Glucose, Bld: 104 mg/dL — ABNORMAL HIGH (ref 70–99)
Potassium: 4.1 meq/L (ref 3.5–5.1)
Sodium: 139 meq/L (ref 135–145)
Total Bilirubin: 0.9 mg/dL (ref 0.2–1.2)
Total Protein: 6.2 g/dL (ref 6.0–8.3)

## 2024-08-23 LAB — CBC WITH DIFFERENTIAL/PLATELET
Basophils Absolute: 0 10*3/uL (ref 0.0–0.1)
Basophils Relative: 0.6 % (ref 0.0–3.0)
Eosinophils Absolute: 0.2 10*3/uL (ref 0.0–0.7)
Eosinophils Relative: 2.6 % (ref 0.0–5.0)
HCT: 45.2 % (ref 39.0–52.0)
Hemoglobin: 14.8 g/dL (ref 13.0–17.0)
Lymphocytes Relative: 16.2 % (ref 12.0–46.0)
Lymphs Abs: 1 10*3/uL (ref 0.7–4.0)
MCHC: 32.9 g/dL (ref 30.0–36.0)
MCV: 90.7 fl (ref 78.0–100.0)
Monocytes Absolute: 0.5 10*3/uL (ref 0.1–1.0)
Monocytes Relative: 7.8 % (ref 3.0–12.0)
Neutro Abs: 4.5 10*3/uL (ref 1.4–7.7)
Neutrophils Relative %: 72.8 % (ref 43.0–77.0)
Platelets: 153 10*3/uL (ref 150.0–400.0)
RBC: 4.98 Mil/uL (ref 4.22–5.81)
RDW: 18.6 % — ABNORMAL HIGH (ref 11.5–15.5)
WBC: 6.2 10*3/uL (ref 4.0–10.5)

## 2024-08-23 LAB — LIPID PANEL
Cholesterol: 79 mg/dL (ref 28–200)
HDL: 35.6 mg/dL — ABNORMAL LOW
LDL Cholesterol: 21 mg/dL (ref 10–99)
NonHDL: 43.78
Total CHOL/HDL Ratio: 2
Triglycerides: 114 mg/dL (ref 10.0–149.0)
VLDL: 22.8 mg/dL (ref 0.0–40.0)

## 2024-08-23 LAB — MICROALBUMIN / CREATININE URINE RATIO
Creatinine,U: 148.1 mg/dL
Microalb Creat Ratio: UNDETERMINED mg/g (ref 0.0–30.0)
Microalb, Ur: <0.7 mg/dL

## 2024-08-23 LAB — URIC ACID: Uric Acid, Serum: 4.7 mg/dL (ref 4.0–7.8)

## 2024-08-23 LAB — HEMOGLOBIN A1C: Hgb A1c MFr Bld: 5.8 % (ref 4.6–6.5)

## 2024-08-23 LAB — PHOSPHORUS: Phosphorus: 3.3 mg/dL (ref 2.3–4.6)

## 2024-08-24 ENCOUNTER — Ambulatory Visit: Payer: Self-pay | Admitting: Family Medicine

## 2024-08-24 ENCOUNTER — Other Ambulatory Visit: Payer: Self-pay | Admitting: Family Medicine

## 2024-08-24 DIAGNOSIS — I10 Essential (primary) hypertension: Secondary | ICD-10-CM

## 2024-08-24 DIAGNOSIS — F321 Major depressive disorder, single episode, moderate: Secondary | ICD-10-CM

## 2024-08-24 DIAGNOSIS — G8929 Other chronic pain: Secondary | ICD-10-CM

## 2024-08-24 DIAGNOSIS — M1A9XX0 Chronic gout, unspecified, without tophus (tophi): Secondary | ICD-10-CM

## 2024-08-24 DIAGNOSIS — J302 Other seasonal allergic rhinitis: Secondary | ICD-10-CM

## 2024-08-24 LAB — VITAMIN D 25 HYDROXY (VIT D DEFICIENCY, FRACTURES): VITD: 23.24 ng/mL — ABNORMAL LOW (ref 30.00–100.00)

## 2024-08-24 LAB — VITAMIN B12: Vitamin B-12: 241 pg/mL (ref 211–911)

## 2024-08-24 LAB — PARATHYROID HORMONE, INTACT (NO CA): PTH: 30 pg/mL (ref 16–77)

## 2024-08-24 LAB — FERRITIN: Ferritin: 24.2 ng/mL (ref 22.0–322.0)

## 2024-08-24 NOTE — Telephone Encounter (Signed)
 Have called and spoke to both local and mail order pharmacy. After verifying with each what they have vs our med list below are things that need to be sent to mail order that are given by our office.  Patient has called office and has been updated that we are sending to pcp to review to make sure  no errors while processing. He will address once he is back in the office later this week.     Medication List        Accurate as of August 24, 2024  9:37 AM. If you have any questions, ask your nurse or doctor.             albuterol  108 (90 Base) MCG/ACT inhaler Commonly known as: VENTOLIN  HFA Inhale 2 puffs into the lungs every 6 (six) hours as needed for wheezing or shortness of breath.   albuterol  (2.5 MG/3ML) 0.083% nebulizer solution Commonly known as: PROVENTIL  Take 3 mLs (2.5 mg total) by nebulization every 6 (six) hours as needed for wheezing or shortness of breath.         buPROPion  150 MG 24 hr tablet Commonly known as: WELLBUTRIN  XL Take 1 tablet by mouth once daily   diclofenac  sodium 1 % Gel Commonly known as: VOLTAREN  Apply 2 g topically daily as needed (pain). Apply to both hands.      fluticasone  50 MCG/ACT nasal spray Commonly known as: FLONASE  instill TWO SPRAYS in each nostril daily         metaxalone  800 MG tablet Commonly known as: SKELAXIN  Take 1 tablet (800 mg total) by mouth 3 (three) times daily as needed for muscle spasms.   metFORMIN  500 MG tablet Commonly known as: GLUCOPHAGE  Take 1 tablet by mouth once daily with breakfast       Potassium 99 MG Tabs Take 1 tablet (99 mg total) by mouth daily.         tiotropium 18 MCG inhalation capsule Commonly known as: Spiriva  HandiHaler Place 1 capsule (18 mcg total) into inhaler and inhale daily.   traMADol  50 MG tablet Commonly known as: ULTRAM  TAKE 1 TABLET BY MOUTH EVERY 12 HOURS AS NEEDED   triamterene -hydrochlorothiazide  37.5-25 MG tablet Commonly known as: MAXZIDE-25 TAKE 1  TABLET BY MOUTH ONCE DAILY BEFORE BREAKFAST   zolpidem  10 MG tablet Commonly known as: AMBIEN  TAKE 1 TABLET(10 MG) BY MOUTH AT BEDTIME AS NEEDED FOR SLEEP

## 2024-08-25 MED ORDER — TRIAMTERENE-HCTZ 37.5-25 MG PO TABS: 1.0000 | ORAL_TABLET | Freq: Every day | ORAL | 3 refills | Status: AC

## 2024-08-25 MED ORDER — METAXALONE 800 MG PO TABS: 800.0000 mg | ORAL_TABLET | Freq: Three times a day (TID) | ORAL | 0 refills | Status: AC | PRN

## 2024-08-25 MED ORDER — FLUTICASONE PROPIONATE 50 MCG/ACT NA SUSP: NASAL | 3 refills | Status: AC

## 2024-08-25 MED ORDER — ZOLPIDEM TARTRATE 10 MG PO TABS: ORAL_TABLET | ORAL | 0 refills | Status: AC

## 2024-08-25 MED ORDER — TIOTROPIUM BROMIDE 18 MCG IN CAPS: 18.0000 ug | ORAL_CAPSULE | Freq: Every day | RESPIRATORY_TRACT | 3 refills | Status: AC

## 2024-08-25 MED ORDER — METFORMIN HCL 500 MG PO TABS: 500.0000 mg | ORAL_TABLET | Freq: Every day | ORAL | 3 refills | Status: AC

## 2024-08-25 MED ORDER — TRAMADOL HCL 50 MG PO TABS: 50.0000 mg | ORAL_TABLET | Freq: Two times a day (BID) | ORAL | 0 refills | Status: AC | PRN

## 2024-08-25 MED ORDER — BUPROPION HCL ER (XL) 150 MG PO TB24: 150.0000 mg | ORAL_TABLET | Freq: Every day | ORAL | 3 refills | Status: AC

## 2024-08-25 MED ORDER — ALBUTEROL SULFATE HFA 108 (90 BASE) MCG/ACT IN AERS: 2.0000 | INHALATION_SPRAY | Freq: Four times a day (QID) | RESPIRATORY_TRACT | 3 refills | Status: AC | PRN

## 2024-08-25 MED ORDER — POTASSIUM 99 MG PO TABS: 1.0000 | ORAL_TABLET | Freq: Every day | ORAL | 3 refills | Status: AC

## 2024-08-25 MED ORDER — ALBUTEROL SULFATE (2.5 MG/3ML) 0.083% IN NEBU: 2.5000 mg | INHALATION_SOLUTION | Freq: Four times a day (QID) | RESPIRATORY_TRACT | 3 refills | Status: AC | PRN

## 2024-08-25 MED ORDER — DICLOFENAC SODIUM 1 % EX GEL: 2.0000 g | Freq: Three times a day (TID) | CUTANEOUS | 3 refills | Status: AC

## 2024-08-25 NOTE — Telephone Encounter (Signed)
 ERx

## 2024-08-29 ENCOUNTER — Other Ambulatory Visit

## 2024-08-29 NOTE — Telephone Encounter (Signed)
 Called patient reviewed all information and repeated back to me. Will call if any questions.    Pt will hold meloxicam and metformin  until instructed otherwise. Pt denies any illness or new medications. Pt does state that he hardly ever drinks water, instead having soda, or tea. Pt encouraged to cut back on these and increse water intake.   Pt reports that he never checks his BP but that he will try to start taking log for appt and states if low he will contact us  and hold lisinopril 

## 2024-09-01 ENCOUNTER — Other Ambulatory Visit: Payer: Self-pay

## 2024-09-01 ENCOUNTER — Other Ambulatory Visit: Payer: Self-pay | Admitting: Family Medicine

## 2024-09-01 DIAGNOSIS — I10 Essential (primary) hypertension: Secondary | ICD-10-CM

## 2024-09-01 DIAGNOSIS — M1A9XX0 Chronic gout, unspecified, without tophus (tophi): Secondary | ICD-10-CM

## 2024-09-01 NOTE — Addendum Note (Signed)
 Addended by: RILLA BALLER on: 09/01/2024 05:10 PM   Modules accepted: Orders

## 2024-09-01 NOTE — Telephone Encounter (Addendum)
 Before refilling please verify pt continues taking furosemide  80mg  daily. Who previously prescribed this?  I have also sent pt a mychart message.

## 2024-09-01 NOTE — Telephone Encounter (Signed)
 Copied from CRM [id number]. Topic: Clinical - Prescription Issue >> Sep 01, 2024 11:03 AM Rea BROCKS wrote: Reason for CRM: [facility] said prescription transfer denied for some medications. They need to be reauthorized by Dr. KANDICE and sent back to [facility].   Furosemide  80 mg   All 90 day supply  [employer] - [facility] - [place], [place] - [address] [place] [address] Phone: [phone] Fax: [phone] Hours: Open 24 hours    [phone] (M)

## 2024-09-04 ENCOUNTER — Other Ambulatory Visit

## 2024-09-05 ENCOUNTER — Encounter: Admitting: Family Medicine

## 2024-10-19 ENCOUNTER — Other Ambulatory Visit

## 2024-10-20 ENCOUNTER — Ambulatory Visit: Admitting: Urology

## 2024-11-29 ENCOUNTER — Ambulatory Visit
